# Patient Record
Sex: Male | Born: 1935 | Race: Black or African American | Hispanic: No | Smoking: Former smoker
Health system: Southern US, Community
[De-identification: ages and names within clinical notes are randomized; demographics above are authoritative.]

## PROBLEM LIST (undated history)

## (undated) DIAGNOSIS — E119 Type 2 diabetes mellitus without complications: Secondary | ICD-10-CM

## (undated) DIAGNOSIS — I10 Essential (primary) hypertension: Secondary | ICD-10-CM

## (undated) DIAGNOSIS — N183 Chronic kidney disease, stage 3 unspecified: Secondary | ICD-10-CM

## (undated) DIAGNOSIS — K859 Acute pancreatitis without necrosis or infection, unspecified: Secondary | ICD-10-CM

## (undated) DIAGNOSIS — I872 Venous insufficiency (chronic) (peripheral): Secondary | ICD-10-CM

## (undated) DIAGNOSIS — R569 Unspecified convulsions: Secondary | ICD-10-CM

## (undated) DIAGNOSIS — R269 Unspecified abnormalities of gait and mobility: Secondary | ICD-10-CM

## (undated) DIAGNOSIS — I4891 Unspecified atrial fibrillation: Secondary | ICD-10-CM

## (undated) DIAGNOSIS — I639 Cerebral infarction, unspecified: Secondary | ICD-10-CM

## (undated) DIAGNOSIS — C801 Malignant (primary) neoplasm, unspecified: Secondary | ICD-10-CM

## (undated) DIAGNOSIS — M1990 Unspecified osteoarthritis, unspecified site: Secondary | ICD-10-CM

## (undated) DIAGNOSIS — F0390 Unspecified dementia without behavioral disturbance: Secondary | ICD-10-CM

## (undated) DIAGNOSIS — D649 Anemia, unspecified: Secondary | ICD-10-CM

## (undated) HISTORY — DX: Anemia, unspecified: D64.9

## (undated) HISTORY — DX: Type 2 diabetes mellitus without complications: E11.9

## (undated) HISTORY — DX: Unspecified osteoarthritis, unspecified site: M19.90

## (undated) HISTORY — DX: Unspecified convulsions: R56.9

## (undated) HISTORY — DX: Cerebral infarction, unspecified: I63.9

## (undated) HISTORY — PX: CHOLECYSTECTOMY: SHX55

## (undated) HISTORY — DX: Essential (primary) hypertension: I10

## (undated) HISTORY — DX: Unspecified atrial fibrillation: I48.91

## (undated) HISTORY — DX: Chronic kidney disease, stage 3 unspecified: N18.30

## (undated) HISTORY — DX: Unspecified abnormalities of gait and mobility: R26.9

## (undated) HISTORY — PX: HERNIA REPAIR: SHX51

## (undated) HISTORY — DX: Venous insufficiency (chronic) (peripheral): I87.2

---

## 1997-10-29 ENCOUNTER — Emergency Department (HOSPITAL_COMMUNITY): Admission: EM | Admit: 1997-10-29 | Discharge: 1997-10-29 | Payer: Self-pay | Admitting: Emergency Medicine

## 1997-12-11 ENCOUNTER — Inpatient Hospital Stay (HOSPITAL_COMMUNITY): Admission: EM | Admit: 1997-12-11 | Discharge: 1997-12-18 | Payer: Self-pay | Admitting: Emergency Medicine

## 1997-12-18 ENCOUNTER — Inpatient Hospital Stay (HOSPITAL_COMMUNITY)
Admission: RE | Admit: 1997-12-18 | Discharge: 1998-01-10 | Payer: Self-pay | Admitting: Physical Medicine & Rehabilitation

## 1998-02-17 ENCOUNTER — Inpatient Hospital Stay (HOSPITAL_COMMUNITY): Admission: EM | Admit: 1998-02-17 | Discharge: 1998-02-19 | Payer: Self-pay | Admitting: Emergency Medicine

## 1999-01-19 ENCOUNTER — Inpatient Hospital Stay (HOSPITAL_COMMUNITY): Admission: EM | Admit: 1999-01-19 | Discharge: 1999-01-20 | Payer: Self-pay | Admitting: Emergency Medicine

## 1999-04-26 ENCOUNTER — Encounter: Payer: Self-pay | Admitting: Emergency Medicine

## 1999-04-26 ENCOUNTER — Emergency Department (HOSPITAL_COMMUNITY): Admission: EM | Admit: 1999-04-26 | Discharge: 1999-04-26 | Payer: Self-pay | Admitting: Emergency Medicine

## 1999-07-21 ENCOUNTER — Encounter: Payer: Self-pay | Admitting: Emergency Medicine

## 1999-07-21 ENCOUNTER — Emergency Department (HOSPITAL_COMMUNITY): Admission: EM | Admit: 1999-07-21 | Discharge: 1999-07-21 | Payer: Self-pay | Admitting: Emergency Medicine

## 1999-07-29 ENCOUNTER — Encounter: Payer: Self-pay | Admitting: *Deleted

## 1999-07-29 ENCOUNTER — Inpatient Hospital Stay (HOSPITAL_COMMUNITY): Admission: EM | Admit: 1999-07-29 | Discharge: 1999-08-05 | Payer: Self-pay | Admitting: Emergency Medicine

## 1999-07-30 ENCOUNTER — Encounter: Payer: Self-pay | Admitting: Pulmonary Disease

## 1999-11-02 ENCOUNTER — Encounter: Payer: Self-pay | Admitting: Emergency Medicine

## 1999-11-02 ENCOUNTER — Emergency Department (HOSPITAL_COMMUNITY): Admission: EM | Admit: 1999-11-02 | Discharge: 1999-11-02 | Payer: Self-pay | Admitting: Emergency Medicine

## 2000-09-29 ENCOUNTER — Inpatient Hospital Stay (HOSPITAL_COMMUNITY): Admission: EM | Admit: 2000-09-29 | Discharge: 2000-10-05 | Payer: Self-pay | Admitting: Emergency Medicine

## 2000-09-29 ENCOUNTER — Encounter: Payer: Self-pay | Admitting: Emergency Medicine

## 2000-09-29 ENCOUNTER — Encounter: Payer: Self-pay | Admitting: Pulmonary Disease

## 2000-10-02 ENCOUNTER — Encounter: Payer: Self-pay | Admitting: Pulmonary Disease

## 2000-10-06 ENCOUNTER — Inpatient Hospital Stay (HOSPITAL_COMMUNITY): Admission: EM | Admit: 2000-10-06 | Discharge: 2000-10-10 | Payer: Self-pay | Admitting: Emergency Medicine

## 2000-10-06 ENCOUNTER — Encounter: Payer: Self-pay | Admitting: Emergency Medicine

## 2000-10-07 ENCOUNTER — Encounter: Payer: Self-pay | Admitting: Internal Medicine

## 2000-10-08 ENCOUNTER — Encounter: Payer: Self-pay | Admitting: Internal Medicine

## 2000-10-21 ENCOUNTER — Ambulatory Visit (HOSPITAL_COMMUNITY): Admission: RE | Admit: 2000-10-21 | Discharge: 2000-10-21 | Payer: Self-pay | Admitting: Pulmonary Disease

## 2000-10-21 ENCOUNTER — Encounter: Payer: Self-pay | Admitting: Pulmonary Disease

## 2001-04-20 ENCOUNTER — Encounter: Payer: Self-pay | Admitting: Emergency Medicine

## 2001-04-20 ENCOUNTER — Emergency Department (HOSPITAL_COMMUNITY): Admission: EM | Admit: 2001-04-20 | Discharge: 2001-04-20 | Payer: Self-pay | Admitting: Emergency Medicine

## 2002-03-05 ENCOUNTER — Inpatient Hospital Stay (HOSPITAL_COMMUNITY): Admission: EM | Admit: 2002-03-05 | Discharge: 2002-03-08 | Payer: Self-pay | Admitting: Emergency Medicine

## 2002-03-06 ENCOUNTER — Encounter: Payer: Self-pay | Admitting: Pulmonary Disease

## 2002-04-02 ENCOUNTER — Emergency Department (HOSPITAL_COMMUNITY): Admission: EM | Admit: 2002-04-02 | Discharge: 2002-04-02 | Payer: Self-pay

## 2002-08-25 ENCOUNTER — Inpatient Hospital Stay (HOSPITAL_COMMUNITY): Admission: EM | Admit: 2002-08-25 | Discharge: 2002-08-27 | Payer: Self-pay | Admitting: Emergency Medicine

## 2003-07-16 ENCOUNTER — Encounter: Admission: RE | Admit: 2003-07-16 | Discharge: 2003-07-16 | Payer: Self-pay | Admitting: Otolaryngology

## 2003-07-17 ENCOUNTER — Ambulatory Visit (HOSPITAL_BASED_OUTPATIENT_CLINIC_OR_DEPARTMENT_OTHER): Admission: RE | Admit: 2003-07-17 | Discharge: 2003-07-17 | Payer: Self-pay | Admitting: Otolaryngology

## 2003-07-17 ENCOUNTER — Encounter (INDEPENDENT_AMBULATORY_CARE_PROVIDER_SITE_OTHER): Payer: Self-pay | Admitting: *Deleted

## 2003-07-17 ENCOUNTER — Ambulatory Visit (HOSPITAL_COMMUNITY): Admission: RE | Admit: 2003-07-17 | Discharge: 2003-07-17 | Payer: Self-pay | Admitting: Otolaryngology

## 2003-08-06 ENCOUNTER — Ambulatory Visit: Admission: RE | Admit: 2003-08-06 | Discharge: 2003-11-01 | Payer: Self-pay | Admitting: *Deleted

## 2003-08-09 ENCOUNTER — Ambulatory Visit (HOSPITAL_COMMUNITY): Admission: RE | Admit: 2003-08-09 | Discharge: 2003-08-09 | Payer: Self-pay | Admitting: *Deleted

## 2003-08-12 ENCOUNTER — Encounter: Admission: RE | Admit: 2003-08-12 | Discharge: 2003-08-12 | Payer: Self-pay | Admitting: Dentistry

## 2003-09-06 ENCOUNTER — Ambulatory Visit (HOSPITAL_COMMUNITY): Admission: RE | Admit: 2003-09-06 | Discharge: 2003-09-06 | Payer: Self-pay | Admitting: Oncology

## 2003-09-18 ENCOUNTER — Emergency Department (HOSPITAL_COMMUNITY): Admission: EM | Admit: 2003-09-18 | Discharge: 2003-09-18 | Payer: Self-pay | Admitting: Emergency Medicine

## 2003-09-19 ENCOUNTER — Ambulatory Visit (HOSPITAL_COMMUNITY): Admission: RE | Admit: 2003-09-19 | Discharge: 2003-09-19 | Payer: Self-pay | Admitting: *Deleted

## 2003-11-25 ENCOUNTER — Ambulatory Visit (HOSPITAL_COMMUNITY): Admission: RE | Admit: 2003-11-25 | Discharge: 2003-11-25 | Payer: Self-pay | Admitting: *Deleted

## 2003-11-27 ENCOUNTER — Ambulatory Visit: Admission: RE | Admit: 2003-11-27 | Discharge: 2003-11-27 | Payer: Self-pay | Admitting: *Deleted

## 2004-01-22 ENCOUNTER — Inpatient Hospital Stay (HOSPITAL_COMMUNITY): Admission: RE | Admit: 2004-01-22 | Discharge: 2004-01-25 | Payer: Self-pay | Admitting: Otolaryngology

## 2004-01-22 ENCOUNTER — Encounter (INDEPENDENT_AMBULATORY_CARE_PROVIDER_SITE_OTHER): Payer: Self-pay | Admitting: *Deleted

## 2004-02-14 ENCOUNTER — Ambulatory Visit: Admission: RE | Admit: 2004-02-14 | Discharge: 2004-02-14 | Payer: Self-pay | Admitting: *Deleted

## 2004-02-18 ENCOUNTER — Ambulatory Visit (HOSPITAL_COMMUNITY): Admission: RE | Admit: 2004-02-18 | Discharge: 2004-02-18 | Payer: Self-pay | Admitting: *Deleted

## 2004-02-21 ENCOUNTER — Ambulatory Visit (HOSPITAL_COMMUNITY): Admission: RE | Admit: 2004-02-21 | Discharge: 2004-02-21 | Payer: Self-pay | Admitting: *Deleted

## 2004-03-05 ENCOUNTER — Emergency Department (HOSPITAL_COMMUNITY): Admission: EM | Admit: 2004-03-05 | Discharge: 2004-03-05 | Payer: Self-pay | Admitting: Emergency Medicine

## 2004-03-12 ENCOUNTER — Ambulatory Visit: Payer: Self-pay | Admitting: Dentistry

## 2004-03-23 ENCOUNTER — Ambulatory Visit: Payer: Self-pay | Admitting: Dentistry

## 2004-03-25 ENCOUNTER — Ambulatory Visit (HOSPITAL_COMMUNITY): Admission: RE | Admit: 2004-03-25 | Discharge: 2004-03-25 | Payer: Self-pay | Admitting: Family Medicine

## 2004-03-31 ENCOUNTER — Ambulatory Visit: Payer: Self-pay | Admitting: Dentistry

## 2004-04-03 ENCOUNTER — Ambulatory Visit: Payer: Self-pay | Admitting: Dentistry

## 2004-04-14 ENCOUNTER — Ambulatory Visit: Payer: Self-pay | Admitting: Dentistry

## 2004-06-09 ENCOUNTER — Ambulatory Visit: Payer: Self-pay | Admitting: Dentistry

## 2004-07-22 ENCOUNTER — Ambulatory Visit: Payer: Self-pay | Admitting: Dentistry

## 2004-07-27 ENCOUNTER — Ambulatory Visit (HOSPITAL_COMMUNITY): Admission: RE | Admit: 2004-07-27 | Discharge: 2004-07-27 | Payer: Self-pay | Admitting: Oncology

## 2004-07-31 ENCOUNTER — Emergency Department (HOSPITAL_COMMUNITY): Admission: EM | Admit: 2004-07-31 | Discharge: 2004-07-31 | Payer: Self-pay | Admitting: Emergency Medicine

## 2004-08-06 ENCOUNTER — Ambulatory Visit: Payer: Self-pay | Admitting: Oncology

## 2004-08-12 ENCOUNTER — Ambulatory Visit: Admission: RE | Admit: 2004-08-12 | Discharge: 2004-11-10 | Payer: Self-pay | Admitting: Radiation Oncology

## 2004-08-23 ENCOUNTER — Emergency Department (HOSPITAL_COMMUNITY): Admission: EM | Admit: 2004-08-23 | Discharge: 2004-08-23 | Payer: Self-pay | Admitting: Emergency Medicine

## 2004-10-13 ENCOUNTER — Ambulatory Visit: Payer: Self-pay | Admitting: Dentistry

## 2004-11-24 ENCOUNTER — Ambulatory Visit: Payer: Self-pay | Admitting: Dentistry

## 2005-01-17 ENCOUNTER — Emergency Department (HOSPITAL_COMMUNITY): Admission: EM | Admit: 2005-01-17 | Discharge: 2005-01-17 | Payer: Self-pay | Admitting: Emergency Medicine

## 2005-01-28 ENCOUNTER — Ambulatory Visit: Payer: Self-pay | Admitting: Hematology and Oncology

## 2005-01-29 ENCOUNTER — Ambulatory Visit (HOSPITAL_COMMUNITY): Admission: RE | Admit: 2005-01-29 | Discharge: 2005-01-29 | Payer: Self-pay | Admitting: Oncology

## 2005-02-17 ENCOUNTER — Ambulatory Visit: Admission: RE | Admit: 2005-02-17 | Discharge: 2005-02-19 | Payer: Self-pay | Admitting: *Deleted

## 2005-02-17 ENCOUNTER — Ambulatory Visit (HOSPITAL_COMMUNITY): Admission: RE | Admit: 2005-02-17 | Discharge: 2005-02-17 | Payer: Self-pay | Admitting: Hematology and Oncology

## 2005-04-25 ENCOUNTER — Emergency Department (HOSPITAL_COMMUNITY): Admission: EM | Admit: 2005-04-25 | Discharge: 2005-04-25 | Payer: Self-pay | Admitting: Emergency Medicine

## 2005-04-28 ENCOUNTER — Ambulatory Visit: Payer: Self-pay | Admitting: Dentistry

## 2005-05-05 ENCOUNTER — Ambulatory Visit: Payer: Self-pay | Admitting: Dentistry

## 2005-05-12 ENCOUNTER — Ambulatory Visit: Payer: Self-pay | Admitting: Dentistry

## 2005-07-26 ENCOUNTER — Ambulatory Visit: Payer: Self-pay | Admitting: Hematology and Oncology

## 2005-07-28 ENCOUNTER — Ambulatory Visit (HOSPITAL_COMMUNITY): Admission: RE | Admit: 2005-07-28 | Discharge: 2005-07-28 | Payer: Self-pay | Admitting: Hematology and Oncology

## 2005-07-28 ENCOUNTER — Ambulatory Visit: Payer: Self-pay | Admitting: Dentistry

## 2006-02-02 ENCOUNTER — Ambulatory Visit (HOSPITAL_COMMUNITY): Admission: RE | Admit: 2006-02-02 | Discharge: 2006-02-02 | Payer: Self-pay | Admitting: Hematology and Oncology

## 2006-02-07 ENCOUNTER — Ambulatory Visit: Payer: Self-pay | Admitting: Hematology and Oncology

## 2006-02-16 ENCOUNTER — Ambulatory Visit: Payer: Self-pay | Admitting: Dentistry

## 2006-02-22 LAB — CBC WITH DIFFERENTIAL/PLATELET
Basophils Absolute: 0 10*3/uL (ref 0.0–0.1)
EOS%: 5.8 % (ref 0.0–7.0)
HGB: 11.4 g/dL — ABNORMAL LOW (ref 13.0–17.1)
LYMPH%: 13.9 % — ABNORMAL LOW (ref 14.0–48.0)
MCH: 30.3 pg (ref 28.0–33.4)
MCV: 91 fL (ref 81.6–98.0)
MONO%: 9.2 % (ref 0.0–13.0)
Platelets: 286 10*3/uL (ref 145–400)
RBC: 3.78 10*6/uL — ABNORMAL LOW (ref 4.20–5.71)
RDW: 12.3 % (ref 11.2–14.6)

## 2006-02-22 LAB — COMPREHENSIVE METABOLIC PANEL
AST: 20 U/L (ref 0–37)
Albumin: 4.2 g/dL (ref 3.5–5.2)
Alkaline Phosphatase: 97 U/L (ref 39–117)
BUN: 13 mg/dL (ref 6–23)
Potassium: 5.1 mEq/L (ref 3.5–5.3)
Total Bilirubin: 0.3 mg/dL (ref 0.3–1.2)

## 2006-05-17 ENCOUNTER — Ambulatory Visit: Payer: Self-pay | Admitting: Dentistry

## 2006-07-10 ENCOUNTER — Emergency Department (HOSPITAL_COMMUNITY): Admission: EM | Admit: 2006-07-10 | Discharge: 2006-07-10 | Payer: Self-pay | Admitting: Emergency Medicine

## 2006-10-24 ENCOUNTER — Ambulatory Visit: Payer: Self-pay | Admitting: Hematology and Oncology

## 2006-10-24 ENCOUNTER — Ambulatory Visit (HOSPITAL_COMMUNITY): Admission: RE | Admit: 2006-10-24 | Discharge: 2006-10-24 | Payer: Self-pay | Admitting: Hematology and Oncology

## 2006-10-27 LAB — COMPREHENSIVE METABOLIC PANEL
AST: 20 U/L (ref 0–37)
Albumin: 3.9 g/dL (ref 3.5–5.2)
Alkaline Phosphatase: 89 U/L (ref 39–117)
BUN: 17 mg/dL (ref 6–23)
Creatinine, Ser: 0.86 mg/dL (ref 0.40–1.50)
Glucose, Bld: 58 mg/dL — ABNORMAL LOW (ref 70–99)
Total Bilirubin: 0.3 mg/dL (ref 0.3–1.2)

## 2006-10-27 LAB — CBC WITH DIFFERENTIAL/PLATELET
Basophils Absolute: 0 10*3/uL (ref 0.0–0.1)
EOS%: 6.3 % (ref 0.0–7.0)
Eosinophils Absolute: 0.3 10*3/uL (ref 0.0–0.5)
HCT: 36.2 % — ABNORMAL LOW (ref 38.7–49.9)
HGB: 12.3 g/dL — ABNORMAL LOW (ref 13.0–17.1)
LYMPH%: 17.7 % (ref 14.0–48.0)
MCH: 30.3 pg (ref 28.0–33.4)
MCV: 89.4 fL (ref 81.6–98.0)
MONO%: 9.4 % (ref 0.0–13.0)
NEUT#: 3.6 10*3/uL (ref 1.5–6.5)
NEUT%: 66.2 % (ref 40.0–75.0)
Platelets: 239 10*3/uL (ref 145–400)
RDW: 12.2 % (ref 11.2–14.6)

## 2006-11-28 ENCOUNTER — Ambulatory Visit: Payer: Self-pay | Admitting: Dentistry

## 2006-11-29 ENCOUNTER — Emergency Department (HOSPITAL_COMMUNITY): Admission: EM | Admit: 2006-11-29 | Discharge: 2006-11-29 | Payer: Self-pay | Admitting: Emergency Medicine

## 2006-12-23 ENCOUNTER — Emergency Department (HOSPITAL_COMMUNITY): Admission: EM | Admit: 2006-12-23 | Discharge: 2006-12-23 | Payer: Self-pay | Admitting: Emergency Medicine

## 2007-01-05 ENCOUNTER — Ambulatory Visit: Payer: Self-pay | Admitting: Dentistry

## 2007-02-28 ENCOUNTER — Ambulatory Visit: Payer: Self-pay | Admitting: Dentistry

## 2007-05-16 ENCOUNTER — Ambulatory Visit: Payer: Self-pay | Admitting: Dentistry

## 2007-07-25 ENCOUNTER — Ambulatory Visit: Payer: Self-pay | Admitting: Dentistry

## 2007-10-05 ENCOUNTER — Emergency Department (HOSPITAL_COMMUNITY): Admission: EM | Admit: 2007-10-05 | Discharge: 2007-10-05 | Payer: Self-pay | Admitting: Emergency Medicine

## 2007-10-18 ENCOUNTER — Ambulatory Visit: Payer: Self-pay | Admitting: Hematology and Oncology

## 2007-10-20 LAB — COMPREHENSIVE METABOLIC PANEL
AST: 16 U/L (ref 0–37)
Albumin: 4.1 g/dL (ref 3.5–5.2)
Alkaline Phosphatase: 97 U/L (ref 39–117)
Potassium: 4.5 mEq/L (ref 3.5–5.3)
Sodium: 137 mEq/L (ref 135–145)
Total Protein: 6.8 g/dL (ref 6.0–8.3)

## 2007-10-20 LAB — CBC WITH DIFFERENTIAL/PLATELET
BASO%: 0.1 % (ref 0.0–2.0)
EOS%: 7.8 % — ABNORMAL HIGH (ref 0.0–7.0)
MCH: 28.6 pg (ref 28.0–33.4)
MCHC: 33.8 g/dL (ref 32.0–35.9)
MCV: 84.5 fL (ref 81.6–98.0)
MONO%: 6 % (ref 0.0–13.0)
NEUT#: 4 10*3/uL (ref 1.5–6.5)
RBC: 4.21 10*6/uL (ref 4.20–5.71)
RDW: 11.7 % (ref 11.2–14.6)

## 2007-11-10 ENCOUNTER — Ambulatory Visit (HOSPITAL_COMMUNITY): Admission: RE | Admit: 2007-11-10 | Discharge: 2007-11-10 | Payer: Self-pay | Admitting: Hematology and Oncology

## 2007-11-29 ENCOUNTER — Ambulatory Visit: Payer: Self-pay | Admitting: Dentistry

## 2008-03-27 ENCOUNTER — Emergency Department (HOSPITAL_COMMUNITY): Admission: EM | Admit: 2008-03-27 | Discharge: 2008-03-27 | Payer: Self-pay | Admitting: Emergency Medicine

## 2008-07-23 ENCOUNTER — Ambulatory Visit: Payer: Self-pay | Admitting: Dentistry

## 2008-09-30 ENCOUNTER — Ambulatory Visit (HOSPITAL_COMMUNITY): Admission: RE | Admit: 2008-09-30 | Discharge: 2008-09-30 | Payer: Self-pay | Admitting: Unknown Physician Specialty

## 2008-10-22 ENCOUNTER — Ambulatory Visit (HOSPITAL_COMMUNITY): Admission: RE | Admit: 2008-10-22 | Discharge: 2008-10-22 | Payer: Self-pay | Admitting: Unknown Physician Specialty

## 2008-11-06 ENCOUNTER — Ambulatory Visit: Payer: Self-pay | Admitting: Hematology and Oncology

## 2008-11-08 ENCOUNTER — Ambulatory Visit (HOSPITAL_COMMUNITY): Admission: RE | Admit: 2008-11-08 | Discharge: 2008-11-08 | Payer: Self-pay | Admitting: Hematology and Oncology

## 2008-11-22 LAB — CBC WITH DIFFERENTIAL/PLATELET
Basophils Absolute: 0 10*3/uL (ref 0.0–0.1)
EOS%: 7.5 % — ABNORMAL HIGH (ref 0.0–7.0)
HCT: 34.8 % — ABNORMAL LOW (ref 38.4–49.9)
HGB: 11.5 g/dL — ABNORMAL LOW (ref 13.0–17.1)
MCH: 29 pg (ref 27.2–33.4)
NEUT%: 68.8 % (ref 39.0–75.0)
lymph#: 0.9 10*3/uL (ref 0.9–3.3)

## 2008-11-22 LAB — BASIC METABOLIC PANEL WITH GFR
BUN: 18 mg/dL (ref 6–23)
CO2: 24 meq/L (ref 19–32)
Calcium: 9.1 mg/dL (ref 8.4–10.5)
Chloride: 99 meq/L (ref 96–112)
Creatinine, Ser: 0.92 mg/dL (ref 0.40–1.50)
Glucose, Bld: 83 mg/dL (ref 70–99)
Potassium: 4.5 meq/L (ref 3.5–5.3)
Sodium: 134 meq/L — ABNORMAL LOW (ref 135–145)

## 2008-12-17 ENCOUNTER — Emergency Department (HOSPITAL_COMMUNITY): Admission: EM | Admit: 2008-12-17 | Discharge: 2008-12-17 | Payer: Self-pay | Admitting: Emergency Medicine

## 2009-02-15 ENCOUNTER — Emergency Department (HOSPITAL_COMMUNITY): Admission: EM | Admit: 2009-02-15 | Discharge: 2009-02-15 | Payer: Self-pay | Admitting: Emergency Medicine

## 2009-02-19 ENCOUNTER — Ambulatory Visit (HOSPITAL_COMMUNITY): Admission: RE | Admit: 2009-02-19 | Discharge: 2009-02-19 | Payer: Self-pay | Admitting: Internal Medicine

## 2009-08-06 ENCOUNTER — Emergency Department (HOSPITAL_COMMUNITY): Admission: EM | Admit: 2009-08-06 | Discharge: 2009-08-06 | Payer: Self-pay | Admitting: Emergency Medicine

## 2009-11-14 ENCOUNTER — Ambulatory Visit: Payer: Self-pay | Admitting: Hematology and Oncology

## 2009-11-26 ENCOUNTER — Ambulatory Visit (HOSPITAL_COMMUNITY): Admission: RE | Admit: 2009-11-26 | Discharge: 2009-11-26 | Payer: Self-pay | Admitting: Hematology and Oncology

## 2009-11-28 LAB — CBC WITH DIFFERENTIAL/PLATELET
BASO%: 0.8 % (ref 0.0–2.0)
Basophils Absolute: 0.1 10*3/uL (ref 0.0–0.1)
EOS%: 8.3 % — ABNORMAL HIGH (ref 0.0–7.0)
HGB: 12.6 g/dL — ABNORMAL LOW (ref 13.0–17.1)
MCH: 28.9 pg (ref 27.2–33.4)
MCHC: 33.5 g/dL (ref 32.0–36.0)
MCV: 86.2 fL (ref 79.3–98.0)
MONO%: 9.3 % (ref 0.0–14.0)
RBC: 4.36 10*6/uL (ref 4.20–5.82)
RDW: 11.6 % (ref 11.0–14.6)
lymph#: 1.5 10*3/uL (ref 0.9–3.3)

## 2009-11-28 LAB — COMPREHENSIVE METABOLIC PANEL
ALT: 8 U/L (ref 0–53)
AST: 17 U/L (ref 0–37)
Albumin: 4.2 g/dL (ref 3.5–5.2)
Alkaline Phosphatase: 73 U/L (ref 39–117)
BUN: 15 mg/dL (ref 6–23)
Potassium: 4.1 mEq/L (ref 3.5–5.3)

## 2010-08-01 ENCOUNTER — Encounter: Payer: Self-pay | Admitting: Oncology

## 2010-08-02 ENCOUNTER — Encounter: Payer: Self-pay | Admitting: Hematology and Oncology

## 2010-08-02 ENCOUNTER — Encounter: Payer: Self-pay | Admitting: Family Medicine

## 2010-10-17 LAB — DIFFERENTIAL
Basophils Absolute: 0 10*3/uL (ref 0.0–0.1)
Basophils Relative: 0 % (ref 0–1)
Neutro Abs: 2.6 10*3/uL (ref 1.7–7.7)
Neutrophils Relative %: 66 % (ref 43–77)

## 2010-10-17 LAB — CBC
HCT: 36.1 % — ABNORMAL LOW (ref 39.0–52.0)
Hemoglobin: 12.4 g/dL — ABNORMAL LOW (ref 13.0–17.0)
MCV: 88.1 fL (ref 78.0–100.0)
RDW: 12.3 % (ref 11.5–15.5)
WBC: 3.9 10*3/uL — ABNORMAL LOW (ref 4.0–10.5)

## 2010-10-17 LAB — URINALYSIS, ROUTINE W REFLEX MICROSCOPIC
Bilirubin Urine: NEGATIVE
Nitrite: NEGATIVE
Specific Gravity, Urine: 1.012 (ref 1.005–1.030)
Urobilinogen, UA: 0.2 mg/dL (ref 0.0–1.0)

## 2010-10-17 LAB — COMPREHENSIVE METABOLIC PANEL
Alkaline Phosphatase: 127 U/L — ABNORMAL HIGH (ref 39–117)
BUN: 9 mg/dL (ref 6–23)
Chloride: 95 mEq/L — ABNORMAL LOW (ref 96–112)
Glucose, Bld: 98 mg/dL (ref 70–99)
Potassium: 4.3 mEq/L (ref 3.5–5.1)
Total Bilirubin: 0.7 mg/dL (ref 0.3–1.2)

## 2010-10-17 LAB — POCT CARDIAC MARKERS: Myoglobin, poc: 134 ng/mL (ref 12–200)

## 2010-10-17 LAB — GLUCOSE, CAPILLARY: Glucose-Capillary: 90 mg/dL (ref 70–99)

## 2010-10-21 LAB — GLUCOSE, CAPILLARY: Glucose-Capillary: 109 mg/dL — ABNORMAL HIGH (ref 70–99)

## 2010-11-27 NOTE — Op Note (Signed)
NAMEANTHONIO, Ronald Sutton                               ACCOUNT NO.:  [id number]   MEDICAL RECORD NO.:  [id number]                   PATIENT TYPE:  AMB   LOCATION:  DSC                                  FACILITY:  [facility]   PHYSICIAN:  Suzanna Obey, M.D.                    DATE OF BIRTH:  01/22/1931   DATE OF PROCEDURE:  07/17/2003  DATE OF DISCHARGE:                                 OPERATIVE REPORT   PREOPERATIVE DIAGNOSIS:  Right base of tongue tumor.   POSTOPERATIVE DIAGNOSIS:  Right base of tongue tumor.   OPERATION PERFORMED:  Direct laryngoscopy and esophagoscopy.   SURGEON:  Suzanna Obey, M.D.   ANESTHESIA:  General endotracheal tube.   ESTIMATED BLOOD LOSS:  Approximately 20 mL.   INDICATIONS FOR PROCEDURE:  This is a 77 year old who has complained about  right ear pain for many months.  He has a right neck mass.  On fiberoptic  exam in the office there is a mass that is submucosal on the right base of  tongue.  He now is for biopsy and then subsequent treatment after  identification of any potential tumor.  He was informed of the risks and  benefits of the procedure including bleeding, infection, scarring,  perforation of the esophagus and risks of the anesthetic.  All questions  were answered and consent was obtained.   DESCRIPTION OF PROCEDURE:  The patient was taken to the operating room and  placed in supine position.  After adequate general endotracheal tube  anesthesia, he was placed in the [facility] position.  The gums were protected  with a wet 4 x 4 and then the direct laryngoscopy was performed.  Immediately, a very large firm base tongue tumor was identified which was  oozing from a generalized place secondary to the intubation. The tumor  extended by finger palpation from midline over completely to the lateral  aspect of the tongue.  It extended down to the vallecula.  It was  approximately 3 to 4 cm in size.  The esophagoscopy was then performed prior  to taking a biopsy  from the mass and there was no evidence of any esophageal  or cervical esophageal lesions.  The piriforms and larynx looked good.  Epiglottis looked clear.  The biopsies were then taken from this mass which  was not exophytic but rather submucosal.  Deep biopsies were taken with a  straight and upbiting forceps which good specimen was obtained.  A pledget  with Adrenalin was placed into the biopsy site and over the tumor site to  gain hemostasis.  It looked good  with just a very mild amount of oozing at the completion. It still could not  be determined exactly where the oozing which was there at the beginning of  the case, was coming from.  He was then awakened and brought to recovery  room  in stable condition.  Counts correct.                                               Suzanna Obey, M.D.    [name]  D:  07/17/2003  T:  07/17/2003  Job:  [id number]   cc:   Zacarias Pontes  [address]  [place]  [place] [address]  Fax: [phone]

## 2010-11-27 NOTE — Op Note (Signed)
[name], Ronald Sutton                               ACCOUNT NO.:  [id number]   MEDICAL RECORD NO.:  [id number]                   PATIENT TYPE:  INP   LOCATION:  2550                                 FACILITY:  [facility]   PHYSICIAN:  Suzanna Obey, M.D.                    DATE OF BIRTH:  1930-07-15   DATE OF PROCEDURE:  01/22/2004  DATE OF DISCHARGE:                                 OPERATIVE REPORT   PREOPERATIVE DIAGNOSIS:  Right neck mass.   POSTOPERATIVE DIAGNOSIS:  Right neck mass.   OPERATION PERFORMED:  Right neck dissection.   SURGEON:  Suzanna Obey, M.D.   ANESTHESIA:  General endotracheal tube.   ESTIMATED BLOOD LOSS:  Less than 10 mL.   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  He has now persisted with a right  neck mass that was present prior to the treatment.  He is now for completion  treatment with neck dissection.  He was informed of the risks and benefits  of the procedure including bleeding, infection, scar, numbness of the neck  and ear, difficulty swallowing, hoarseness, weakness of the shoulder, and  risks of the anesthetic.  All questions were answered and consent was  obtained.   DESCRIPTION OF PROCEDURE:  The patient was taken to the operating room and  placed in supine position.  After adequate general endotracheal tube  anesthesia, he as well as placed in the left gaze position, prepped and  draped in the usual sterile manner.  A hockey stick incision was directed  from the lobe of the ear down toward the thyroid cartilage.  This was opened  with electrocautery.  Dissection was continued raising a flap of  subplatysmal.  The fascia was then taken off the sternocleidomastoid muscle.  The external jugular vein was preserved.  The dissection was carried down to  the spinal accessory nerve where it was dissected free up to the  digastric.  The fascia was taken off inferior to the spinal accessory nerve as the mass  was down in about the level 3 zone.  Dissection was carried along the  cervical plexus region and this mass was brought up taking the dissection  down below the omohyoid muscle.  This was then brought up and dissected off  the carotid and the jugular vein.  Once the tumor was freed up at the  jugular vein, it was palpated and it did feel like it might be still  somewhat adherent to the jugular vein and rather than dissect this, the  jugular vein was just taken inferiorly, clamped with stick tie of 3-0 silk  and a 2-0 silk tie and it was divided.  The dissection was then continued  removing the specimen and jugular vein  off the carotid artery.  The vagus  nerve was preserved.  Dissection was carried up to the hypoglossal nerve  where it was identified and carefully preserved.  The facial vein was  clamped and the superior aspect of the jugular vein was clamped and divided.  The spinal accessory nerve was intact.  The specimen was removed and sent  for pathology.  The wound  was copiously irrigated and there was good hemostasis.  A Valsalva maneuver  was performed.  The #7 flap Jackson-Pratt drain was placed and secured with  a 3-0 nylon.  The subcutaneous was closed with interrupted 4-0 chromic and  skin staples to close the skin.  The patient was awakened and brought to  recovery in stable condition, counts correct.                                               Suzanna Obey, M.D.    Ronald Sutton  D:  01/22/2004  T:  01/22/2004  Job:  [id number]   cc:   Ronald Sutton, M.D.  Fax: [phone]

## 2010-11-27 NOTE — Discharge Summary (Signed)
[place]  Patient:    Ronald Sutton, Ronald Sutton                            MRN: [id number] Adm. Date:  [id number] Disc. Date: 10/10/00 Attending:  Leslee Home D                           Discharge Summary  CHIEF COMPLAINT:  The patient fell down and was brought to the emergency room after having grand mal seizures.  He was admitted from [facility]  HISTORY OF PRESENT ILLNESS:  This is a 77 year old man who has a known history of alcoholic cerebellar degeneration, and has had previous left brain stroke and right-sided weakness.  He was stable until the afternoon of admission, when he began to have acute onset of grand mal seizures.  Following the gran mal seizure he was given 2 mg of Ativan, which controlled his seizure to the point where the seizure had stopped, but he remained comatose.  The patient had no previous history of seizures.  He only had a mild temperature elevation of 99.4 at the time of evaluation in emergency room, but had ongoing tachycardia.  The patient has a history of non-insulin-dependent diabetes mellitus and had been having chest pain, for which he was given nitroglycerin for in the recent past.  PAST MEDICAL HISTORY: 1. Known alcoholism with alcoholic degeneration. 2. History of previous left-sided cerebral CVA. 3. Known history of cerebellar degeneration.  ALLERGIES:  No known drug allergies.  PAST SURGICAL HISTORY:  Cholecystectomy.  SOCIAL HISTORY:  The patient abused alcohol and was a previous heavy user of cigarettes.  Disabled in the 1970s secondary to cerebellar degeneration.  He ambulates with a cane.  His sister has power of attorney.  Currently resident of [facility].  FAMILY HISTORY:  The patients father died of a CVA at the age of 47.  His mother died of unknown causes.  The patient has three brothers, alive and well.  PHYSICAL EXAMINATION:  GENERAL:  The patient appears in manner with his  stated age of 49.  VITAL SIGNS:  Blood pressure 174/89, heart rate 115, temperature 99.4.  HEENT:  Pupils small and were minimally reactive; but the sclera was clear. Neck is supple.  Oral cavity unremarkable.  NECK:  Trachea was midline.  No thyromegaly.  LUNGS:  Clear.  CARDIOVASCULAR:  Rapid sinus rhythm.  No murmurs, but significant pathology. Was difficult to exclude in this setting.  ABDOMEN:  Soft.  The patient has right upper quadrant cholecystectomy scar.  GENITALIA:  Normal male.  EXTREMITIES:  No evidence of edema or other abnormalities.  The patient is not moving the left side.  Reflexes are suppressed throughout.  Plantar signs upgoing on the right and not responsive on the left.  LABORATORY AND OTHER INVESTIGATIONS:  Blood cultures were clear.  CSF cultures with no organisms seen.  Urinalysis was clear.  CSF protein 45, CSF glucose 154.  LDH fluid 13.  There was one red cell and two white cells in his CSF. Troponin I 0.06.  CK 514, CK-MB 4.9.  Comprehensive Metabolic Panel -- Sodium 137, potassium 3.5, chloride 103, CO2 13, glucose 296.  Liver Function Tests were normal.  On October 07, 2000 his alkaline phosphatase was 107. On October 06, 2000 CK was repeated at 203, MB 3.9; his Troponin was 0.01. Initial admission pH 7.116,  pCO2 29.9, bicarbonate 10.  On four liters on October 07, 2000 pH 7.39, pCO2 42.8, pO2 85.6, bicarbonate 25.5.  PT/PTT were normal.  Amylase 75.  Toxin screen negative.  Alcohol level less than 10.  RADIOLOGY:  Ultrasound of the abdomen -- Portions of the head and tail of the pancreas were not well seen, due to overriding bowel gas.  There was some prominence of the pancreatic duct.  A CT scan of the abdomen was suggested. He is status post cholecystectomy.  No intrahepatic ductal dilatation was noted.  CHEST X-Emet:  There is some atelectasis at the right base.  The vascularity was normal.  CT SCAN OF HEAD:  Showed atrophy.  No evidence of acute  abnormalities.  CHEST X-Dagen:  (October 06, 2000)  No active process.  HOSPITAL COURSE:  This patient was admitted with new onset of generalized seizures.  A CT scan of the head was not only negative for acute event, there was really no evidence of an old CVA either.  His toxin screen was normal. His alcohol level was undetectable.  I did not specifically exclude an alcohol withdrawal state, although my sense is that this is probably idiopathic epilepsy.  Because of the tachycardia, he was seen in consultation by Dr. Orpah Cobb. He agreed with serial cardiac enzymes.  A 2d echocardiogram was done, is pending at time of dictation.  Because of abdominal distention, it was felt that we needed to rule out ascites.  Ultrasound was done, which was negative for ascites or significant intrahepatic duct dilatation.  There was no focal liver lesions.  There was some prominence of the pancreatic duct, although my feeling was that we did not need to go ahead and CT scan his abdomen at this time.  In terms of his seizures, the patient is stabilized nicely on Dilantin.  He will need to be followed on this.  His discharge Dilantin level was stable on 300 mg once a day.  In terms of his diabetes, his Glyburide was restarted at 2.5 b.i.d., and his CBGs have done nicely on this.  FINAL DIAGNOSES: 1. Idiopathic epilepsy. 2. Tachycardia with EKG, suggesting the possibility of an old inferior    myocardial infarction.  Echocardiogram result is pending at time    of dictation. 3. Type 2 diabetes mellitus.  DISCHARGE MEDICATIONS: 1. Glyburide 2.5 mg p.o. b.i.d. 2. Ecotrin 325 mg p.o. q.d. 3. Dilantin 300 mg q.d. 4. Vicon Forte one q.d. 5. Nitroglycerin 0.4 mg sublingual p.r.n.  CONDITION ON DISCHARGE:  The patient is discharged in stable condition.  It is recommended that he have a Dilantin level in two weeks time, and if that is stable monthly thereafter.  His cardiac status seemed stable at  present. DD:  10/10/00 TD:  10/10/00 Job: [id number]

## 2010-11-27 NOTE — H&P (Signed)
[place]  Patient:    Ronald Sutton, Ronald Sutton                            MRN: [id number] Adm. Date:  [id number] Attending:  Leslee Home D                         History and Physical  CHIEF COMPLAINT:  The patient "fell down" and was brought in to the emergency room after having grand mal seizures.  The patient was admitted from [facility] rest home.  HISTORY OF PRESENT ILLNESS:  The patient is a 77 year old black male who has a known history of alcoholic cerebellar degeneration and has had a previous left-sided brain CVA and right-sided weakness.  He was stable, until the p.m. of admission, when he began to have acute onset grand mal seizures.  Following the grand mal seizure, he was given 2 mg of Ativan, which controlled his seizure, to the point where the seizures had stopped, but he remained comatose.  The patient had no previous seizures.  He had only mild temperature elevation at 99.4 at time of evaluation at the emergency room and ongoing tachycardia. The patient has a history non-insulin-dependent diabetes mellitus and had been having chest pain, which he was given nitroglycerin for in the recent past.  PAST MEDICAL HISTORY:  Known alcoholism with alcoholic cerebellar degeneration.  He has history of previous left-sided cerebral CVA and known history of cerebellar degeneration.  ALLERGIES:  No known drug allergies.  PAST SURGICAL HISTORY:  Cholecystectomy.  SOCIAL HISTORY:  The patient abused alcohol and previous heavy user of cigarettes.  Disabled since the 1970s secondary to cerebellar degeneration. Ambulates with a cane.  Sister has power-of-attorney.  Her phone number is [phone].  He is currently a resident of [facility].  FAMILY HISTORY:  The patients father died of a CVA at age 37.  Mother died of unknown causes.  The patient has three brothers, alive and well.  He has two children who are also alive and well.  REVIEW OF SYSTEMS:   General:  He has an elevated temperature ______ today. Some recent chest pain but otherwise no complaints.  ______ He has known history of dysphagia in the past, which has resolved.  Dysphagia was following a CVA.  ______:  No complaints.  Cardiovascular:  No tachycardia at time of evaluation.  GI:  History of previous constipation in the past.  GU:  Foley catheter in place.  Otherwise, no complaints.  Musculoskeletal:  Generalized weakness on this occasion.  Prior to this, no onset of any other symptoms.  He has right-sided weakness.  He requires a cane to walk.  Neurologic:  As above. No other neurological symptoms.  PHYSICAL EXAMINATION:  GENERAL:  The patient appears as a man who approximates his age of 71, who is comatose at time of the examination.  VITAL SIGNS:  Blood pressure 174/89, heart rate 115 per minute, temperature 99.4.  HEENT:  Pupils small and minimally reactive but sclera is clear.  Neck is supple.  Oral cavity unremarkable.  NECK:  As mentioned.  Trachea midline.  No thyromegaly.  LUNGS:  Clear to auscultation.  CARDIOVASCULAR:  Rapid sinus rhythm.  Difficult ______ murmurs ______  in this setting.  ABDOMEN:  Soft.  The patient has right upper quadrant cholecystectomy scar.  GENITALIA:  Normal ______ palpation.  Foley catheter in place.  EXTREMITIES:  No evidence of edema or other abnormalities.  The patient is not moving the left side.  Reflexes suppressed throughout.  Plantar signs upgoing on the right and nonresponsive on the left.  The patient is minimally responsive to tactile stimulant.  IMPRESSION: 1. Acute onset of grand mal seizure. 2. Coma secondary to #1 likely. 3. Hyperglycemia secondary to non-insulin-dependent diabetes mellitus. 4. History of previous cerebrovascular accident (left-sided). 5. History of alcohol cerebellar degeneration. 6. Previous chest pains. 7. Rule out myocardial infarction. 8. Sinus tachycardia.  PLAN:  The patient  will be admitted to the neurologic ICU and will undergo evaluation for cause of the patients seizure disorder.  Currently, he is undergoing a spinal tap.  Serial EKGs and enzymes will be evaluated for possible acute myocardial infarction.  A 2-D echocardiogram will be ordered to rule out any thromboembolic phenomenon. DD:  10/06/00 TD:  10/07/00 Job: [id number]

## 2010-11-27 NOTE — Discharge Summary (Signed)
NAMEMY, MADARIAGA                   ACCOUNT NO.:  [id number]   MEDICAL RECORD NO.:  [id number]          PATIENT TYPE:  INP   LOCATION:  5702                         FACILITY:  [facility]   PHYSICIAN:  Suzanna Obey, M.D.       DATE OF BIRTH:  December 27, 1930   DATE OF ADMISSION:  01/22/2004  DATE OF DISCHARGE:  01/25/2004                                 DISCHARGE SUMMARY   ADMISSION DIAGNOSIS:  Right neck mass.   POSTOPERATIVE DIAGNOSIS:  Right neck mass.   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  He  has now had radiation and chemotherapy and persists with a right neck mass.  This now needs to be removed with right neck dissection.  The patient was  admitted for this procedure.   HOSPITAL COURSE:  The patient was admitted after right neck dissection.  He  did very well.  He was able to take fluids almost immediately.  His JP drain  on postoperative day #1 was approximately 30 mL.  The wound looked good with  no evidence of infection.  On postoperative day #2, he was afebrile, 50 mL  from the drain, and wound was healing well.  He was eating and drinking  well.  On hospital day #3, he had about 25 mL of drainage and the JP drain  was removed and the patient was discharged to home.  His wound was healing  great.  He was feeling well and afebrile.  He is to follow up in one week  for suture removal.  He is to follow up sooner if he has swelling, erythema,  increasing pain, or any breathing difficulty.       JB/MEDQ  D:  04/16/2004  T:  04/17/2004  Job:  [id number]

## 2011-04-05 LAB — BASIC METABOLIC PANEL
CO2: 28
Calcium: 8.9
GFR calc Af Amer: >60
Glucose, Bld: 109 — ABNORMAL HIGH
Potassium: 4.4
Sodium: 138

## 2011-04-05 LAB — DIFFERENTIAL
Basophils Relative: 0
Monocytes Relative: 11
Neutro Abs: 3.5
Neutrophils Relative %: 66

## 2011-04-05 LAB — CBC
MCHC: 32.9
Platelets: 210
RBC: 3.92 — ABNORMAL LOW
WBC: 5.3

## 2011-04-12 LAB — POCT I-STAT, CHEM 8
BUN: 16
Chloride: 102
Creatinine, Ser: 1
Potassium: 4
Sodium: 134 — ABNORMAL LOW

## 2011-04-12 LAB — DIFFERENTIAL
Basophils Relative: 0
Lymphocytes Relative: 14
Lymphs Abs: 0.9
Monocytes Absolute: 0.6
Monocytes Relative: 10
Neutro Abs: 4.8

## 2011-04-12 LAB — URINALYSIS, ROUTINE W REFLEX MICROSCOPIC
Glucose, UA: NEGATIVE
Hgb urine dipstick: NEGATIVE
Specific Gravity, Urine: 1.012
pH: 6

## 2011-04-12 LAB — URINE CULTURE

## 2011-04-12 LAB — CBC
Hemoglobin: 12.1 — ABNORMAL LOW
MCHC: 33.3
RBC: 4.02 — ABNORMAL LOW
WBC: 6.6

## 2011-04-12 LAB — SAMPLE TO BLOOD BANK

## 2011-12-07 ENCOUNTER — Inpatient Hospital Stay (HOSPITAL_COMMUNITY)
Admission: EM | Admit: 2011-12-07 | Discharge: 2011-12-09 | DRG: 101 | Disposition: A | Discharge status: Home / Self Care | Payer: Medicare Other | Attending: Internal Medicine | Admitting: Internal Medicine

## 2011-12-07 ENCOUNTER — Encounter (HOSPITAL_COMMUNITY): Payer: Self-pay | Admitting: Emergency Medicine

## 2011-12-07 DIAGNOSIS — D649 Anemia, unspecified: Secondary | ICD-10-CM | POA: Diagnosis present

## 2011-12-07 DIAGNOSIS — G9340 Encephalopathy, unspecified: Secondary | ICD-10-CM | POA: Diagnosis present

## 2011-12-07 DIAGNOSIS — G40909 Epilepsy, unspecified, not intractable, without status epilepticus: Principal | ICD-10-CM | POA: Diagnosis present

## 2011-12-07 DIAGNOSIS — R569 Unspecified convulsions: Secondary | ICD-10-CM | POA: Diagnosis present

## 2011-12-07 DIAGNOSIS — F17200 Nicotine dependence, unspecified, uncomplicated: Secondary | ICD-10-CM | POA: Diagnosis present

## 2011-12-07 DIAGNOSIS — R404 Transient alteration of awareness: Secondary | ICD-10-CM | POA: Diagnosis present

## 2011-12-07 DIAGNOSIS — I10 Essential (primary) hypertension: Secondary | ICD-10-CM | POA: Diagnosis present

## 2011-12-07 HISTORY — DX: Acute pancreatitis without necrosis or infection, unspecified: K85.90

## 2011-12-07 HISTORY — DX: Essential (primary) hypertension: I10

## 2011-12-07 HISTORY — DX: Unspecified convulsions: R56.9

## 2011-12-07 LAB — POCT I-STAT, CHEM 8
Calcium, Ion: 1.12 mmol/L (ref 1.12–1.32)
HCT: 28 % — ABNORMAL LOW (ref 39.0–52.0)
TCO2: 23 mmol/L (ref 0–100)

## 2011-12-07 MED ORDER — LORAZEPAM 2 MG/ML IJ SOLN
2.0000 mg | Freq: Once | INTRAMUSCULAR | Status: AC
  Administered 2011-12-07: 2 mg via INTRAVENOUS

## 2011-12-07 MED ORDER — SODIUM CHLORIDE 0.9 % IV SOLN
1000.0000 mg | Freq: Once | INTRAVENOUS | Status: AC
  Administered 2011-12-07: 1000 mg via INTRAVENOUS
  Filled 2011-12-07: qty 20

## 2011-12-07 MED ORDER — LORAZEPAM 2 MG/ML IJ SOLN
INTRAMUSCULAR | Status: AC
  Filled 2011-12-07: qty 1

## 2011-12-07 NOTE — ED Notes (Signed)
Family out in hall stating pt was having a seizure Went in room to find the pt having a seizure  EDP [employer] received

## 2011-12-07 NOTE — ED Notes (Signed)
Per EMS pt was in the kitchen and family heard him fall and when they went in he was having a seizure  Pt has hx of same and is on medication for which he takes as prescribed  Last seizure was about a month ago  Pt is alert upon arrival but still a little postictal  Saline lock in right arm with #18 gauge  Oxygen on at 2 liters/min via [place]

## 2011-12-07 NOTE — ED Notes (Signed)
MD at bedside. 

## 2011-12-07 NOTE — ED Notes (Signed)
Pt having labs drawn at this time.  

## 2011-12-07 NOTE — ED Notes (Signed)
[id number]<GN> Expected date:12/07/11<BR> Expected time:<BR> Means of arrival:<BR> Comments:<BR> EMS 211 GC - seizure/immobilized

## 2011-12-07 NOTE — ED Provider Notes (Signed)
History     CSN: [id number]  Arrival date & time 12/07/11  2001   First MD Initiated Contact with Patient 12/07/11 2109      Chief Complaint  Patient presents with  . Seizures    (Consider location/radiation/quality/duration/timing/severity/associated sxs/prior treatment) Patient is a 78 y.o. male presenting with seizures. The history is provided by the patient.  Seizures    issue here after having a witnessed seizure this evening at home described as tonic-clonic activity lasting for possibly 2 minutes associated with a brief postictal period. Last seizure was over a year. He takes Dilantin for seizures. Denies any recent illnesses. Denies any fever, vomiting, neck pain, photophobia. Denies any headache or neck pain or back pain. No medications taken prior to arrival. Nothing makes her symptoms better or worse  Past Medical History  Diagnosis Date  . Hypertension   . Seizures   . Pancreatitis     Past Surgical History  Procedure Date  . Hernia repair     Family History  Problem Relation Age of Onset  . Hypertension Other   . Diabetes Other   . Asthma Other     History  Substance Use Topics  . Smoking status: Current Some Day Smoker    Types: Cigars  . Smokeless tobacco: Not on file  . Alcohol Use: No      Review of Systems  Neurological: Positive for seizures.  All other systems reviewed and are negative.    Allergies  Review of patient's allergies indicates no known allergies.  Home Medications   Current Outpatient Rx  Name Route Sig Dispense Refill  . ATENOLOL 25 MG PO TABS Oral Take 25 mg by mouth daily.    Marland Kitchen PHENYTOIN SODIUM EXTENDED 100 MG PO CAPS Oral Take 200-300 mg by mouth See admin instructions. 3 caps qhs on Sundays and Thursdays; 2 caps qhs on all other days      BP 126/73  Pulse 73  Temp(Src) 99.4 F (37.4 C) (Oral)  Resp 18  SpO2 99%  Physical Exam  Nursing note and vitals reviewed. Constitutional: He is oriented to person,  place, and time. Vital signs are normal. He appears well-developed and well-nourished.  Non-toxic appearance. No distress.  HENT:  Head: Normocephalic and atraumatic.  Eyes: Conjunctivae, EOM and lids are normal. Pupils are equal, round, and reactive to light.  Neck: Normal range of motion. Neck supple. No tracheal deviation present. No mass present.  Cardiovascular: Normal rate, regular rhythm and normal heart sounds.  Exam reveals no gallop.   No murmur heard. Pulmonary/Chest: Effort normal and breath sounds normal. No stridor. No respiratory distress. He has no decreased breath sounds. He has no wheezes. He has no rhonchi. He has no rales.  Abdominal: Soft. Normal appearance and bowel sounds are normal. He exhibits no distension. There is no tenderness. There is no rebound and no CVA tenderness.  Musculoskeletal: Normal range of motion. He exhibits no edema and no tenderness.  Neurological: He is alert and oriented to person, place, and time. He has normal strength. No cranial nerve deficit or sensory deficit. GCS eye subscore is 4. GCS verbal subscore is 5. GCS motor subscore is 6.  Skin: Skin is warm and dry. No abrasion and no rash noted.  Psychiatric: He has a normal mood and affect. His speech is normal and behavior is normal.    ED Course  Procedures (including critical care time)   Labs Reviewed  PHENYTOIN LEVEL, TOTAL   No results found.  No diagnosis found.    MDM  Patient given Ativan for seizure activity here. Loaded with Dilantin. Dilantin level is pending at this time also check head CT. Patient signed out to Dr. Maryagnes Amos, MD 12/07/11 [phone]

## 2011-12-08 ENCOUNTER — Encounter (HOSPITAL_COMMUNITY): Payer: Self-pay

## 2011-12-08 ENCOUNTER — Inpatient Hospital Stay (HOSPITAL_COMMUNITY): Payer: Medicare Other

## 2011-12-08 ENCOUNTER — Emergency Department (HOSPITAL_COMMUNITY): Payer: Medicare Other

## 2011-12-08 DIAGNOSIS — I10 Essential (primary) hypertension: Secondary | ICD-10-CM

## 2011-12-08 DIAGNOSIS — G9340 Encephalopathy, unspecified: Secondary | ICD-10-CM

## 2011-12-08 DIAGNOSIS — R569 Unspecified convulsions: Secondary | ICD-10-CM | POA: Diagnosis present

## 2011-12-08 LAB — URINALYSIS, ROUTINE W REFLEX MICROSCOPIC
Bilirubin Urine: NEGATIVE
Ketones, ur: NEGATIVE mg/dL
Nitrite: NEGATIVE
Urobilinogen, UA: 0.2 mg/dL (ref 0.0–1.0)
pH: 7 (ref 5.0–8.0)

## 2011-12-08 LAB — CBC
MCHC: 31.6 g/dL (ref 30.0–36.0)
Platelets: 212 10*3/uL (ref 150–400)
RDW: 12.8 % (ref 11.5–15.5)
WBC: 5.4 10*3/uL (ref 4.0–10.5)

## 2011-12-08 LAB — COMPREHENSIVE METABOLIC PANEL
Alkaline Phosphatase: 102 U/L (ref 39–117)
BUN: 12 mg/dL (ref 6–23)
GFR calc Af Amer: >90 mL/min (ref >90)
GFR calc non Af Amer: 78 mL/min — ABNORMAL LOW (ref >90)
Glucose, Bld: 97 mg/dL (ref 70–99)
Potassium: 4.5 mEq/L (ref 3.5–5.1)
Total Protein: 7.1 g/dL (ref 6.0–8.3)

## 2011-12-08 LAB — PHENYTOIN LEVEL, TOTAL: Phenytoin Lvl: 9.9 ug/mL — ABNORMAL LOW (ref 10.0–20.0)

## 2011-12-08 LAB — CREATININE, SERUM
GFR calc Af Amer: 89 mL/min — ABNORMAL LOW (ref >90)
GFR calc non Af Amer: 77 mL/min — ABNORMAL LOW (ref >90)

## 2011-12-08 LAB — APTT: aPTT: 32 seconds (ref 24–37)

## 2011-12-08 MED ORDER — PHENYTOIN SODIUM 50 MG/ML IJ SOLN
100.0000 mg | Freq: Three times a day (TID) | INTRAMUSCULAR | Status: DC
  Filled 2011-12-08 (×2): qty 2

## 2011-12-08 MED ORDER — FLEET ENEMA 7-19 GM/118ML RE ENEM: 1.0000 | ENEMA | Freq: Once | RECTAL | Status: AC | PRN

## 2011-12-08 MED ORDER — POTASSIUM CHLORIDE IN NACL 20-0.9 MEQ/L-% IV SOLN
INTRAVENOUS | Status: DC
  Administered 2011-12-08 (×4): via INTRAVENOUS
  Filled 2011-12-08 (×3): qty 1000

## 2011-12-08 MED ORDER — PHENYTOIN SODIUM EXTENDED 100 MG PO CAPS
200.0000 mg | ORAL_CAPSULE | Freq: Every day | ORAL | Status: DC
  Administered 2011-12-08: 200 mg via ORAL
  Filled 2011-12-08 (×2): qty 2

## 2011-12-08 MED ORDER — PHENYTOIN 50 MG PO CHEW
50.0000 mg | CHEWABLE_TABLET | Freq: Every day | ORAL | Status: DC
  Administered 2011-12-08: 50 mg via ORAL
  Filled 2011-12-08 (×2): qty 1

## 2011-12-08 MED ORDER — PHENYTOIN SODIUM EXTENDED 30 MG PO CAPS: 250.0000 mg | ORAL_CAPSULE | Freq: Every day | ORAL | Status: DC

## 2011-12-08 MED ORDER — ATENOLOL 25 MG PO TABS
25.0000 mg | ORAL_TABLET | Freq: Every day | ORAL | Status: DC
  Administered 2011-12-08 – 2011-12-09 (×2): 25 mg via ORAL
  Filled 2011-12-08 (×2): qty 1

## 2011-12-08 MED ORDER — PHENYTOIN SODIUM 50 MG/ML IJ SOLN
100.0000 mg | Freq: Once | INTRAMUSCULAR | Status: AC
  Administered 2011-12-08: 100 mg via INTRAVENOUS
  Filled 2011-12-08: qty 2

## 2011-12-08 MED ORDER — ENOXAPARIN SODIUM 40 MG/0.4ML ~~LOC~~ SOLN
40.0000 mg | SUBCUTANEOUS | Status: DC
  Administered 2011-12-08 – 2011-12-09 (×2): 40 mg via SUBCUTANEOUS
  Filled 2011-12-08 (×2): qty 0.4

## 2011-12-08 NOTE — H&P (Signed)
PCP:   No primary provider on file.   History is compromised because of patient's mental Status  Chief Complaint:  Recurrent seizures  HPI: Carlos Shields is an 78 y.o. male.   Apparently with known seizure history,  but seizure-free for the past year on phenytoin. Had a grand mal seizure at home and was then brought to the emergency room where he had another witnessed seizure. He was given 2 mg of Ativan and loaded with Dilantin. CT scan of the brain failed to reveal any acute abnormality. But over some 8 hours in the emergency room patient has failed to wake up, but is having a presumably prolonged postictal state and the hospitalist service was called to assist with management.  In attempting to interview the patient he's able to indicate that he knows he's in hospital, unable to state which hospital, unable to give any other pertinent information related to his medical problems  Rewiew of Systems:  Unable to obtain  Past Medical History  Diagnosis Date  . Hypertension   . Seizures   . Pancreatitis     Past Surgical History  Procedure Date  . Hernia repair     Medications:  HOME MEDS: Prior to Admission medications   Medication Sig Start Date End Date Taking? Authorizing Provider  atenolol (TENORMIN) 25 MG tablet Take 25 mg by mouth daily.   Yes Historical Provider, MD  phenytoin (DILANTIN) 100 MG ER capsule Take 200-300 mg by mouth See admin instructions. 3 caps qhs on Sundays and Thursdays; 2 caps qhs on all other days   Yes Historical Provider, MD     Allergies:  No Known Allergies  Social History:   reports that he has been smoking Cigars.  He does not have any smokeless tobacco history on file. He reports that he does not drink alcohol or use illicit drugs.  Family History: Family History  Problem Relation Age of Onset  . Hypertension Other   . Diabetes Other   . Asthma Other      Physical Exam: Filed Vitals:   12/07/11 2005 12/07/11 2015 12/08/11 0215  BP:   126/73 125/56  Pulse:  73 70  Temp:  99.4 F (37.4 C)   TempSrc:  Oral   Resp:  18 16  SpO2: 100% 99% 100%   Blood pressure 125/56, pulse 70, temperature 99.4 F (37.4 C), temperature source Oral, resp. rate 16, SpO2 100.00%.  GEN: Drowsy confused elderly African American gentleman lying in the stretcher in no acute distress; not cooperative with exam PSYCH:   Unable to obtain further HEENT: Mucous membranes pink, dry and anicteric; PERRLA; EOM intact; no cervical lymphadenopathy nor thyromegaly or carotid bruit; no JVD; Breasts:: Not examined CHEST WALL: No tenderness CHEST: Normal respiration, clear to auscultation bilaterally HEART: Regular rate and rhythm; no murmurs rubs or gallops BACK: no CVA tenderness ABDOMEN:  soft non-tender; no masses, no organomegaly, normal abdominal bowel sounds; no pannus; no intertriginous candida. Rectal Exam: Not done EXTREMITIES:  age-appropriate arthropathy of the especiallyhands and knees; no edema; no ulcerations. Genitalia: not examined PULSES: 2+ and symmetric SKIN: Normal hydration no rash or ulceration CNS: Cranial nerves 2-12 grossly intact no obvious  focal lateralizing neurologic deficit   Labs & Imaging Results for orders placed during the hospital encounter of 12/07/11 (from the past 48 hour(s))  PHENYTOIN LEVEL, TOTAL     Status: Abnormal   Collection Time   12/07/11  9:41 PM      Component Value Range Comment  Phenytoin Lvl 9.9 (*) 10.0 - 20.0 (ug/mL)   CBC     Status: Abnormal   Collection Time   12/07/11  9:42 PM      Component Value Range Comment   WBC 5.4  4.0 - 10.5 (K/uL)    RBC 2.89 (*) 4.22 - 5.81 (MIL/uL)    Hemoglobin 8.4 (*) 13.0 - 17.0 (g/dL)    HCT 16.1 (*) 09.6 - 52.0 (%)    MCV 92.0  78.0 - 100.0 (fL)    MCH 29.1  26.0 - 34.0 (pg)    MCHC 31.6  30.0 - 36.0 (g/dL)    RDW 04.5  40.9 - 81.1 (%)    Platelets 212  150 - 400 (K/uL)   POCT I-STAT, CHEM 8     Status: Abnormal   Collection Time   12/07/11  9:46  PM      Component Value Range Comment   Sodium 141  135 - 145 (mEq/L)    Potassium 4.2  3.5 - 5.1 (mEq/L)    Chloride 108  96 - 112 (mEq/L)    BUN 22  6 - 23 (mg/dL)    Creatinine, Ser 9.14  0.50 - 1.35 (mg/dL)    Glucose, Bld 782 (*) 70 - 99 (mg/dL)    Calcium, Ion 9.56  1.12 - 1.32 (mmol/L)    TCO2 23  0 - 100 (mmol/L)    Hemoglobin 9.5 (*) 13.0 - 17.0 (g/dL)    HCT 21.3 (*) 08.6 - 52.0 (%)    Ct Head Wo Contrast  12/08/2011  *RADIOLOGY REPORT*  Clinical Data: Seizures.  CT HEAD WITHOUT CONTRAST  Technique:  Contiguous axial images were obtained from the base of the skull through the vertex without contrast.  Comparison: None.  Findings: Technically limited study due to motion artifact.  There is diffuse cerebral atrophy.  Ventricular dilatation likely representing central atrophy although normal pressure hydrocephalus is not excluded.  Low attenuation changes throughout the deep white matter consistent with small vessel ischemia.  No mass effect or midline shift.  No abnormal extra-axial fluid collections.  Wallace Cullens- white matter junctions are distinct.  Basal cisterns are not effaced.  No acute intracranial hemorrhage.  Vascular calcifications.  Diffuse opacification of the maxillary antra and ethmoid air cells and frontal sinuses with opacification of the left sphenoid sinuses.  Mastoid air cells are are not opacified. Vascular calcifications.  IMPRESSION: No acute intracranial abnormalities.  Diffuse atrophy and small vessel ischemic changes.  Opacification of the paranasal sinuses.  Original Report Authenticated By: Marlon Pel, M.D.      Assessment Present on Admission:  .Seizures .HTN (hypertension) .Encephalopathy Possible sinusituis  PLAN:  bring this gentleman on to telemetry unit for recovery of his vital signs; although it's possible that he is taking a long time to clear Ativan from his system, it's also possible he has some other neurologic disorder; will therefore it  gets that MRI evaluation.   keep him n.p.o. while he's this drowsy  Other plans as per orders.    Wyolene Weimann 12/08/2011, 5:16 AM

## 2011-12-08 NOTE — ED Notes (Signed)
Family went home  Contact # Welton Flakes [phone]

## 2011-12-08 NOTE — ED Notes (Signed)
MD at bedside. 

## 2011-12-08 NOTE — Care Management Note (Signed)
    Page 1 of 1   12/09/2011     11:03:54 AM   CARE MANAGEMENT NOTE 12/09/2011  Patient:  Carlos Shields, Carlos Shields   Account Number:  [id number]  Date Initiated:  12/08/2011  Documentation initiated by:  Lanier Clam  Subjective/Objective Assessment:   ADMITTED W/SEIZURE.ZO:[id number]     Action/Plan:   FROM HOME W/SUPPORT.HAS CANE. CURRENT PCP-DR. KILPATRICK.   Anticipated DC Date:  12/09/2011   Anticipated DC Plan:  HOME/SELF CARE  In-house referral  PCP / [facility]      DC Planning Services  CM consult      Choice offered to / List presented to:             Status of service:  Completed, signed off Medicare Important Message given?   (If response is "NO", the following Medicare IM given date fields will be blank) Date Medicare IM given:   Date Additional Medicare IM given:    Discharge Disposition:  HOME/SELF CARE  Per UR Regulation:  Reviewed for med. necessity/level of care/duration of stay  If discussed at Long Length of Stay Meetings, dates discussed:    Comments:  12/08/11 Kassaundra Hair RN,BSN NCM [phone] PROVIDED W/PCP/[facility] LISTING AS RESOURCE PER FAMILY REQUEST.

## 2011-12-08 NOTE — Progress Notes (Signed)
Subjective: Patient was sleepy earlier today but more arousable on my examination.  Was able to have a conversation.  Objective: Vital signs in last 24 hours: Filed Vitals:   12/08/11 0730 12/08/11 0745 12/08/11 0858 12/08/11 0910  BP:   140/66 122/68  Pulse: 64 68 75 65  Temp:    99.3 F (37.4 C)  TempSrc:    Oral  Resp: 13 12 17 16   Height:    5\' 2"  (1.575 m)  Weight:    54.069 kg (119 lb 3.2 oz)  SpO2: 96% 99% 98% 100%   Weight change:  No intake or output data in the 24 hours ending 12/08/11 1250  Physical Exam: General: Awake, Oriented to self and location, No acute distress. HEENT: EOMI. Neck: Supple CV: S1 and S2 Lungs: Clear to ascultation bilaterally Abdomen: Soft, Nontender, Nondistended, +bowel sounds. Ext: Good pulses. Trace edema.  Lab Results:  Basename 12/08/11 0616 12/07/11 2146  NA -- 141  K -- 4.2  CL -- 108  CO2 -- --  GLUCOSE -- 108*  BUN -- 22  CREATININE 0.97 1.20  CALCIUM -- --  MG -- --  PHOS -- --   No results found for this basename: AST:2,ALT:2,ALKPHOS:2,BILITOT:2,PROT:2,ALBUMIN:2 in the last 72 hours No results found for this basename: LIPASE:2,AMYLASE:2 in the last 72 hours  Basename 12/07/11 2146 12/07/11 2142  WBC -- 5.4  NEUTROABS -- --  HGB 9.5* 8.4*  HCT 28.0* 26.6*  MCV -- 92.0  PLT -- 212   No results found for this basename: CKTOTAL:3,CKMB:3,CKMBINDEX:3,TROPONINI:3 in the last 72 hours No components found with this basename: POCBNP:3 No results found for this basename: DDIMER:2 in the last 72 hours No results found for this basename: HGBA1C:2 in the last 72 hours No results found for this basename: CHOL:2,HDL:2,LDLCALC:2,TRIG:2,CHOLHDL:2,LDLDIRECT:2 in the last 72 hours  Basename 12/08/11 0616  TSH 2.388  T4TOTAL --  T3FREE --  THYROIDAB --   No results found for this basename: VITAMINB12:2,FOLATE:2,FERRITIN:2,TIBC:2,IRON:2,RETICCTPCT:2 in the last 72 hours  Micro Results: No results found for this or any  previous visit (from the past 240 hour(s)).  Studies/Results: Ct Head Wo Contrast  12/08/2011  *RADIOLOGY REPORT*  Clinical Data: Seizures.  CT HEAD WITHOUT CONTRAST  Technique:  Contiguous axial images were obtained from the base of the skull through the vertex without contrast.  Comparison: None.  Findings: Technically limited study due to motion artifact.  There is diffuse cerebral atrophy.  Ventricular dilatation likely representing central atrophy although normal pressure hydrocephalus is not excluded.  Low attenuation changes throughout the deep white matter consistent with small vessel ischemia.  No mass effect or midline shift.  No abnormal extra-axial fluid collections.  Wallace Cullens- white matter junctions are distinct.  Basal cisterns are not effaced.  No acute intracranial hemorrhage.  Vascular calcifications.  Diffuse opacification of the maxillary antra and ethmoid air cells and frontal sinuses with opacification of the left sphenoid sinuses.  Mastoid air cells are are not opacified. Vascular calcifications.  IMPRESSION: No acute intracranial abnormalities.  Diffuse atrophy and small vessel ischemic changes.  Opacification of the paranasal sinuses.  Original Report Authenticated By: Marlon Pel, M.D.   Mr Brain Wo Contrast  12/08/2011  *RADIOLOGY REPORT*  Clinical Data: Witnessed seizure.  History of seizures. Hypertension.  MRI HEAD WITHOUT CONTRAST  Technique:  Multiplanar, multiecho pulse sequences of the brain and surrounding structures were obtained according to standard protocol without intravenous contrast.  Comparison: 12/08/2011 head CT.  Findings: Motion degraded exam.  No acute infarct.  No intracranial hemorrhage.  Remote small thalamic and basal ganglia infarcts.  Moderate small vessel disease type changes.  Global atrophy.  Ventricular prominence may be related to central atrophy rather than hydrocephalus.  4.6 mm posterior right pituitary cystic-appearing structure possibly an  incidentally detected pituitary cyst versus adenoma. No other intracranial mass lesion noted on this unenhanced motion degraded exam.  Left hippocampus minimally smaller than the right without definitive findings of mesial temporal sclerosis.  Paranasal sinus opacification with complete opacification of the frontal sinuses, majority of ethmoid sinus air cells, portion of the right sphenoid sinus and maxillary sinus bilaterally.  Major intracranial vascular structures are patent.  IMPRESSION: Motion degraded exam.  No acute infarct.  No intracranial hemorrhage.  Remote small thalamic and basal ganglia infarcts.  Moderate small vessel disease type changes.  Global atrophy.  Ventricular prominence may be related to central atrophy rather than hydrocephalus.  4.6 mm posterior right pituitary cystic-appearing structure possibly an incidentally detected pituitary cyst versus adenoma.  Left hippocampus minimally smaller than the right without definitive findings of mesial temporal sclerosis.  Paranasal sinus opacification with complete opacification of the frontal sinuses, majority of ethmoid sinus air cells, portion of the right sphenoid sinus and maxillary sinus bilaterally.  Original Report Authenticated By: Fuller Canada, M.D.    Medications: I have reviewed the patient's current medications. Scheduled Meds:   . atenolol  25 mg Oral Daily  . enoxaparin  40 mg Subcutaneous Q24H  . LORazepam      . LORazepam  2 mg Intravenous Once  . phenytoin (DILANTIN) IV  1,000 mg Intravenous Once  . phenytoin  200 mg Oral QHS   And  . phenytoin  50 mg Oral QHS  . phenytoin (DILANTIN) IV  100 mg Intravenous Once  . DISCONTD: phenytoin  250 mg Oral QHS  . DISCONTD: phenytoin (DILANTIN) IV  100 mg Intravenous Q8H   Continuous Infusions:   . 0.9 % NaCl with KCl 20 mEq / L 50 mL/hr at 12/08/11 1243   PRN Meds:.sodium phosphate  Assessment/Plan: Seizures Patient had MRI of the brain on 12/08/2011 which did not  show any acute changes, MRI did show strokes.  Dilantin level on admission was low at 9.9. Discussed with Dr. Vonita Moss, neurology, recommended increasing Dilantin to 250 mg po qhs (home dose 200 mg qhs except on Sunday 300 mg po qhs) as a Dilantin level was slightly subtherapeutic on admission.  No need to add another agent.  HTN (hypertension) Resume home antihypertensive medication.  Altered mental status/acute delirium/Encephalopathy Likely due to seizure.  Improved.  Prophylaxis Lovenox.  Disposition Discharge the patient tomorrow if no further episodes of seizures.   LOS: 1 day  Janaye Corp A, MD 12/08/2011, 12:50 PM

## 2011-12-08 NOTE — ED Notes (Signed)
Dilantin infusion complete.  Pt resting without distress noted  Family at bedside

## 2011-12-08 NOTE — ED Notes (Signed)
Returned from MRI 

## 2011-12-08 NOTE — ED Notes (Signed)
Patient transported to MRI 

## 2011-12-08 NOTE — ED Notes (Signed)
AM labs collected.

## 2011-12-08 NOTE — Progress Notes (Signed)
MEDICATION RELATED CONSULT NOTE - INITIAL   Pharmacy Consult for Dilantin Indication: Recurrent Seizures  No Known Allergies  Patient Measurements: Height: 5\' 2"  (157.5 cm) Weight: 119 lb 3.2 oz (54.069 kg) IBW/kg (Calculated) : 54.6   Vital Signs: Temp: 99.3 F (37.4 C) (05/29 0910) Temp src: Oral (05/29 0910) BP: 122/68 mmHg (05/29 0910) Pulse Rate: 65  (05/29 0910) Intake/Output from previous day:   Intake/Output from this shift:    Labs:  Basename 12/08/11 0616 12/07/11 2146 12/07/11 2142  WBC -- -- 5.4  HGB -- 9.5* 8.4*  HCT -- 28.0* 26.6*  PLT -- -- 212  APTT 32 -- --  CREATININE 0.97 1.20 --  LABCREA -- -- --  CREATININE 0.97 1.20 --  CREAT24HRUR -- -- --  MG -- -- --  PHOS -- -- --  ALBUMIN -- -- --  PROT -- -- --  ALBUMIN -- -- --  AST -- -- --  ALT -- -- --  ALKPHOS -- -- --  BILITOT -- -- --  BILIDIR -- -- --  IBILI -- -- --   Estimated Creatinine Clearance: 48 ml/min (by C-G formula based on Cr of 0.97).   Microbiology: No results found for this or any previous visit (from the past 720 hour(s)).  Medical History: Past Medical History  Diagnosis Date  . Hypertension   . Seizures   . Pancreatitis     Medications:  Scheduled:    . enoxaparin  40 mg Subcutaneous Q24H  . LORazepam      . LORazepam  2 mg Intravenous Once  . phenytoin (DILANTIN) IV  1,000 mg Intravenous Once   Infusions:    . 0.9 % NaCl with KCl 20 mEq / L 100 mL/hr at 12/08/11 0744   PRN: sodium phosphate  Assessment: 78 yo M with known seizure history, reportedly seizure-free for the last year on phenytoin (although note from ER RN indicates EMS was told the patient's last seizure was about a month ago), had a grand mal seizure at home and another witnessed seizure in the ER on 5/28 ~10pm.  Was given Lorazepam 2mg  IV and phenytoin 1g IV x1 (~18 mg/kg) in ER between 22:00-23:00 on 5/28. Phenytoin level = 9.9 on 5/28 at 21:41 (which is essentially therapeutic). Home  phenytoin dose reported as 300mg  PO qhs on Sundays and Thursdays, and 200mg  PO QHS on Mon,Tue,Wed,Fri, and Saturdays - last dose reportedly taken 5/27.  Pharmacy now asked to consult with phenytoin dosing.  Goal of Therapy:  Phenytoin level 10-20 mcg/ml (will aim for higher end of goal range given therapeutic admit level)  Plan:  1) Check phenytoin level at 18:00 tonight to insure adequate load. (although phenytoin level on admit was already essentially therapeutic). 2) Dilantin 100mg  IV TID (~6mg /kg/day) 3) CMET today - need albumin, LFTs, SCr 4) Not sure if there are alternate reasons for seizure, since admission phenytoin level was on the low side of therapeutic range?  Darrol Angel, PharmD Pager: [phone] 12/08/2011,9:51 AM

## 2011-12-09 DIAGNOSIS — G9340 Encephalopathy, unspecified: Secondary | ICD-10-CM

## 2011-12-09 DIAGNOSIS — D649 Anemia, unspecified: Secondary | ICD-10-CM | POA: Diagnosis present

## 2011-12-09 DIAGNOSIS — I10 Essential (primary) hypertension: Secondary | ICD-10-CM

## 2011-12-09 DIAGNOSIS — R569 Unspecified convulsions: Secondary | ICD-10-CM

## 2011-12-09 LAB — COMPREHENSIVE METABOLIC PANEL
ALT: 16 U/L (ref 0–53)
AST: 25 U/L (ref 0–37)
Albumin: 3 g/dL — ABNORMAL LOW (ref 3.5–5.2)
CO2: 23 mEq/L (ref 19–32)
Calcium: 8 mg/dL — ABNORMAL LOW (ref 8.4–10.5)
GFR calc non Af Amer: 77 mL/min — ABNORMAL LOW (ref >90)
Sodium: 134 mEq/L — ABNORMAL LOW (ref 135–145)
Total Protein: 6.7 g/dL (ref 6.0–8.3)

## 2011-12-09 LAB — CBC
HCT: 29.1 % — ABNORMAL LOW (ref 39.0–52.0)
Hemoglobin: 9.5 g/dL — ABNORMAL LOW (ref 13.0–17.0)
MCH: 29.9 pg (ref 26.0–34.0)
MCHC: 32.6 g/dL (ref 30.0–36.0)
MCV: 91.5 fL (ref 78.0–100.0)

## 2011-12-09 LAB — IRON AND TIBC
Saturation Ratios: 19 % — ABNORMAL LOW (ref 20–55)
TIBC: 240 ug/dL (ref 215–435)
UIBC: 195 ug/dL (ref 125–400)

## 2011-12-09 LAB — RETICULOCYTES: Retic Count, Absolute: 28.6 10*3/uL (ref 19.0–186.0)

## 2011-12-09 MED ORDER — PHENYTOIN SODIUM EXTENDED 200 MG PO CAPS: 250.0000 mg | ORAL_CAPSULE | Freq: Every day | ORAL | Status: DC

## 2011-12-09 MED ORDER — POLYSACCHARIDE IRON COMPLEX 150 MG PO CAPS: 150.0000 mg | ORAL_CAPSULE | Freq: Every day | ORAL | Status: DC

## 2011-12-09 MED ORDER — POLYSACCHARIDE IRON COMPLEX 150 MG PO CAPS
150.0000 mg | ORAL_CAPSULE | Freq: Every day | ORAL | Status: DC
  Administered 2011-12-09: 150 mg via ORAL
  Filled 2011-12-09: qty 1

## 2011-12-09 NOTE — Progress Notes (Signed)
MEDICATION RELATED CONSULT NOTE - Follow Up   Pharmacy Consult for Dilantin Indication: Recurrent Seizures  No Known Allergies  Patient Measurements: Height: 5\' 2"  (157.5 cm) Weight: 114 lb 3.2 oz (51.801 kg) IBW/kg (Calculated) : 54.6   Vital Signs: Temp: 98.2 F (36.8 C) (05/30 0519) Temp src: Oral (05/30 0519) BP: 112/59 mmHg (05/30 0519) Pulse Rate: 63  (05/30 0519)  Labs:  Basename 12/09/11 0503 12/08/11 1800 12/08/11 0616 12/07/11 2146 12/07/11 2142  WBC 5.2 -- -- -- 5.4  HGB 9.5* -- -- 9.5* 8.4*  HCT 29.1* -- -- 28.0* 26.6*  PLT 198 -- -- -- 212  APTT -- -- 32 -- --  CREATININE 0.98 0.93 0.97 -- --  LABCREA -- -- -- -- --  CREATININE 0.98 0.93 0.97 -- --  CREAT24HRUR -- -- -- -- --  MG -- -- -- -- --  PHOS -- -- -- -- --  ALBUMIN 3.0* 3.2* -- -- --  PROT 6.7 7.1 -- -- --  ALBUMIN 3.0* 3.2* -- -- --  AST 25 28 -- -- --  ALT 16 17 -- -- --  ALKPHOS 91 102 -- -- --  BILITOT 0.4 0.5 -- -- --  BILIDIR -- -- -- -- --  IBILI -- -- -- -- --   Estimated Creatinine Clearance: 45.5 ml/min (by C-G formula based on Cr of 0.98).   Microbiology: No results found for this or any previous visit (from the past 720 hour(s)).  Medical History: Past Medical History  Diagnosis Date  . Hypertension   . Seizures   . Pancreatitis     Medications:  Scheduled:     . atenolol  25 mg Oral Daily  . enoxaparin  40 mg Subcutaneous Q24H  . LORazepam      . phenytoin  200 mg Oral QHS   And  . phenytoin  50 mg Oral QHS  . phenytoin (DILANTIN) IV  100 mg Intravenous Once  . DISCONTD: phenytoin  250 mg Oral QHS  . DISCONTD: phenytoin (DILANTIN) IV  100 mg Intravenous Q8H   Infusions:     . 0.9 % NaCl with KCl 20 mEq / L 50 mL/hr at 12/08/11 2110   PRN: sodium phosphate  Assessment: 78 yo M with known seizure history, reportedly seizure-free for the last year on phenytoin (although note from ER RN indicates EMS was told the patient's last seizure was about a month  ago), had a grand mal seizure at home and another witnessed seizure in the ER on 5/28 ~10pm.  Was given Lorazepam 2mg  IV and phenytoin 1g IV x1 (~18 mg/kg) in ER between 22:00-23:00 on 5/28. Phenytoin level = 9.9 on 5/28 at 21:41 (which is essentially therapeutic). Home phenytoin dose reported as 300mg  PO qhs on Sundays and Thursdays, and 200mg  PO QHS on Mon,Tue,Wed,Fri, and Saturdays - last dose reportedly taken 5/27.  Pharmacy initiated dose to phenytoin 100mg  IV TID yesterday, but dose was changed by MD to phenytoin 250mg  PO QHS per Neuro recs. Dilantin level obtained last night at 18:00 was 26.6 (but corrected for low albumin, actual dilantin level = 36, which is supratherapeutic, meaning patient was more than adequately loaded with Dilantin in ER. Possible discharge today noted. If patient is not experiencing signs of Phenytoin toxicity (which include: Nystagmus, blurred vision, diplopia, ataxia, slurred speech, dizziness, drowsiness, lethargy, coma, rash, fever, nausea, vomiting, gum tenderness, and confusion), then no action needed. If patient is experiencing toxicity symptoms, suggest rechecking a phenytoin level and recontacting Neuro  for recs (since they are the ones who recommended a maintenance dose).  Goal of Therapy:  Phenytoin level 10-20 mcg/ml   Plan:  1) Continue with Dilantin dose per Neuro recs. 2) MD and/or RN - please assess patient for signs/symptoms of phenytoin toxicity - Nystagmus, blurred vision, diplopia, ataxia, slurred speech, dizziness, drowsiness, lethargy, coma, rash, fever, nausea, vomiting, gum tenderness, and confusion. Recheck a phenytoin level if signs/symptoms present. 3) Suggest rechecking a phenytoin level, SCr, and albumin in 5-7 days to assess trend and rechecking another phenytoin level, SCr, and albumin in 10-14 days to assess steady state level.  Darrol Angel, PharmD Pager: [phone] 12/09/2011,9:15 AM

## 2011-12-09 NOTE — Progress Notes (Signed)
Subjective: Patient awake today. Daughter and daughter-in-law by bedside, mentation at baseline.  Objective: Vital signs in last 24 hours: Filed Vitals:   12/09/11 0519 12/09/11 0631 12/09/11 0914 12/09/11 1007  BP: 112/59  110/60 106/63  Pulse: 63  64 68  Temp: 98.2 F (36.8 C)     TempSrc: Oral     Resp: 18   18  Height:      Weight:  51.801 kg (114 lb 3.2 oz)    SpO2: 100%   99%   Weight change:   Intake/Output Summary (Last 24 hours) at 12/09/11 1013 Last data filed at 12/09/11 7829  Gross per 24 hour  Intake    480 ml  Output   2225 ml  Net  -1745 ml    Physical Exam: General: Awake, Oriented x3, No acute distress. HEENT: EOMI. Neck: Supple CV: S1 and S2 Lungs: Clear to ascultation bilaterally Abdomen: Soft, Nontender, Nondistended, +bowel sounds. Ext: Good pulses. Trace edema.  Lab Results:  Basename 12/09/11 0503 12/08/11 1800  NA 134* 137  K 4.2 4.5  CL 102 106  CO2 23 24  GLUCOSE 114* 97  BUN 16 12  CREATININE 0.98 0.93  CALCIUM 8.0* 8.4  MG -- --  PHOS -- --    Basename 12/09/11 0503 12/08/11 1800  AST 25 28  ALT 16 17  ALKPHOS 91 102  BILITOT 0.4 0.5  PROT 6.7 7.1  ALBUMIN 3.0* 3.2*   No results found for this basename: LIPASE:2,AMYLASE:2 in the last 72 hours  Basename 12/09/11 0503 12/07/11 2146 12/07/11 2142  WBC 5.2 -- 5.4  NEUTROABS -- -- --  HGB 9.5* 9.5* --  HCT 29.1* 28.0* --  MCV 91.5 -- 92.0  PLT 198 -- 212   No results found for this basename: CKTOTAL:3,CKMB:3,CKMBINDEX:3,TROPONINI:3 in the last 72 hours No components found with this basename: POCBNP:3 No results found for this basename: DDIMER:2 in the last 72 hours No results found for this basename: HGBA1C:2 in the last 72 hours No results found for this basename: CHOL:2,HDL:2,LDLCALC:2,TRIG:2,CHOLHDL:2,LDLDIRECT:2 in the last 72 hours  Basename 12/08/11 0616  TSH 2.388  T4TOTAL --  T3FREE --  THYROIDAB --    Basename 12/09/11 0503  VITAMINB12 --  FOLATE --    FERRITIN --  TIBC --  IRON --  RETICCTPCT 0.9    Micro Results: No results found for this or any previous visit (from the past 240 hour(s)).  Studies/Results: Ct Head Wo Contrast  12/08/2011  *RADIOLOGY REPORT*  Clinical Data: Seizures.  CT HEAD WITHOUT CONTRAST  Technique:  Contiguous axial images were obtained from the base of the skull through the vertex without contrast.  Comparison: None.  Findings: Technically limited study due to motion artifact.  There is diffuse cerebral atrophy.  Ventricular dilatation likely representing central atrophy although normal pressure hydrocephalus is not excluded.  Low attenuation changes throughout the deep white matter consistent with small vessel ischemia.  No mass effect or midline shift.  No abnormal extra-axial fluid collections.  Wallace Cullens- white matter junctions are distinct.  Basal cisterns are not effaced.  No acute intracranial hemorrhage.  Vascular calcifications.  Diffuse opacification of the maxillary antra and ethmoid air cells and frontal sinuses with opacification of the left sphenoid sinuses.  Mastoid air cells are are not opacified. Vascular calcifications.  IMPRESSION: No acute intracranial abnormalities.  Diffuse atrophy and small vessel ischemic changes.  Opacification of the paranasal sinuses.  Original Report Authenticated By: Marlon Pel, M.D.   Mr  Brain Wo Contrast  12/08/2011  *RADIOLOGY REPORT*  Clinical Data: Witnessed seizure.  History of seizures. Hypertension.  MRI HEAD WITHOUT CONTRAST  Technique:  Multiplanar, multiecho pulse sequences of the brain and surrounding structures were obtained according to standard protocol without intravenous contrast.  Comparison: 12/08/2011 head CT.  Findings: Motion degraded exam.  No acute infarct.  No intracranial hemorrhage.  Remote small thalamic and basal ganglia infarcts.  Moderate small vessel disease type changes.  Global atrophy.  Ventricular prominence may be related to central  atrophy rather than hydrocephalus.  4.6 mm posterior right pituitary cystic-appearing structure possibly an incidentally detected pituitary cyst versus adenoma. No other intracranial mass lesion noted on this unenhanced motion degraded exam.  Left hippocampus minimally smaller than the right without definitive findings of mesial temporal sclerosis.  Paranasal sinus opacification with complete opacification of the frontal sinuses, majority of ethmoid sinus air cells, portion of the right sphenoid sinus and maxillary sinus bilaterally.  Major intracranial vascular structures are patent.  IMPRESSION: Motion degraded exam.  No acute infarct.  No intracranial hemorrhage.  Remote small thalamic and basal ganglia infarcts.  Moderate small vessel disease type changes.  Global atrophy.  Ventricular prominence may be related to central atrophy rather than hydrocephalus.  4.6 mm posterior right pituitary cystic-appearing structure possibly an incidentally detected pituitary cyst versus adenoma.  Left hippocampus minimally smaller than the right without definitive findings of mesial temporal sclerosis.  Paranasal sinus opacification with complete opacification of the frontal sinuses, majority of ethmoid sinus air cells, portion of the right sphenoid sinus and maxillary sinus bilaterally.  Original Report Authenticated By: Fuller Canada, M.D.    Medications: I have reviewed the patient's current medications. Scheduled Meds:    . atenolol  25 mg Oral Daily  . enoxaparin  40 mg Subcutaneous Q24H  . LORazepam      . phenytoin  200 mg Oral QHS   And  . phenytoin  50 mg Oral QHS  . phenytoin (DILANTIN) IV  100 mg Intravenous Once  . DISCONTD: phenytoin  250 mg Oral QHS  . DISCONTD: phenytoin (DILANTIN) IV  100 mg Intravenous Q8H   Continuous Infusions:    . 0.9 % NaCl with KCl 20 mEq / L 50 mL/hr at 12/08/11 2110   PRN Meds:.sodium phosphate  Assessment/Plan: Seizures Patient had MRI of the brain on  12/08/2011 which did not show any acute changes, MRI did show old strokes.  Dilantin level on admission was low at 9.9. Discussed with Dr. Vonita Moss, neurology, recommended increasing Dilantin to 250 mg po qhs (home dose 200 mg qhs except on Sunday 300 mg po qhs) as a Dilantin level was slightly subtherapeutic on admission.  No need to add another agent. Mentation back to baseline. Instructed him to checked with his PCP Dr. Petra Kuba in 1 week for albumin, dilantin level and CBC.  HTN (hypertension) Resume home antihypertensive medication.  Altered mental status/acute delirium/Encephalopathy Likely due to seizure.  Improved.  Anemia Hemoglobin stable. Anemia panel pending, but start supplemental iron, PCP to followup on anemia panel. Uncertain if dilantin may be causing anemia. May need a colonscopy if not done in the last 5-10 years.  Prophylaxis Lovenox.  Disposition Discharge the patient today.   LOS: 2 days  Flynn Gwyn A, MD 12/09/2011, 10:13 AM

## 2011-12-09 NOTE — Discharge Summary (Addendum)
Discharge Summary  Carlos Shields MR#: [id number]  DOB:[birth year]  Date of Admission: 12/07/2011 Date of Discharge: 12/09/2011  Patient's PCP: Eino Farber, MD, MD  Attending Physician:Tayli Buch A  Consults: Dr. Roseanne Reno, neurology, by telephone.  Discharge Diagnoses: Active Problems:  Seizures  HTN (hypertension)  Encephalopathy  Anemia  Brief Admitting History and Physical Carlos Shields is an 78 y.o. Male, with known seizure history, but seizure-free for the past year on phenytoin. Had a seizure at home and was then brought to the emergency room where he had another witnessed seizure on 12/08/2011.   Discharge Medications Medication List  As of 12/09/2011 10:28 AM   TAKE these medications         atenolol 25 MG tablet   Commonly known as: TENORMIN   Take 25 mg by mouth daily.      iron polysaccharides 150 MG capsule   Commonly known as: NIFEREX   Take 1 capsule (150 mg total) by mouth daily.      phenytoin 200 MG ER capsule   Commonly known as: DILANTIN   Take 1 capsule (200 mg total) by mouth at bedtime. Total dose 250 mg (ok to prescribe 200 mg and 50 mg tablets)            Hospital Course: Seizures Patient had MRI of the brain on 12/08/2011 which did not show any acute changes, MRI did show old strokes.  Dilantin level on admission was low at 9.9. Discussed with Dr. Vonita Moss, neurology, recommended increasing Dilantin to 250 mg po qhs (home dose 200 mg qhs except on Thursday and Sunday 300 mg po qhs) as a Dilantin level was slightly subtherapeutic on admission.  No need to add another agent. Mentation back to baseline on 12/09/2011. Instructed him to checked with his PCP Dr. Petra Kuba in 1 week for albumin, dilantin level and CBC.  Will defer to primary care physician whether the patient would benefit from a neurology referral as outpatient or not.  Patient was instructed not to drive for atleast 6 months, he reported that he does not drive anyway, he expressed  understanding.  HTN (hypertension) Resume home antihypertensive medication.  Altered mental status/acute delirium/Encephalopathy Likely due to seizure.  Improved.  Anemia Hemoglobin stable. Anemia panel pending, but start supplemental iron, PCP to followup on anemia panel. Uncertain if dilantin may be causing anemia. May need a colonscopy if not done in the last 5-10 years.  Day of Discharge BP 106/63  Pulse 68  Temp(Src) 98.5 F (36.9 C) (Oral)  Resp 18  Ht 5\' 2"  (1.575 m)  Wt 51.801 kg (114 lb 3.2 oz)  BMI 20.89 kg/m2  SpO2 99%  Results for orders placed during the hospital encounter of 12/07/11 (from the past 48 hour(s))  PHENYTOIN LEVEL, TOTAL     Status: Abnormal   Collection Time   12/07/11  9:41 PM      Component Value Range Comment   Phenytoin Lvl 9.9 (*) 10.0 - 20.0 (ug/mL)   CBC     Status: Abnormal   Collection Time   12/07/11  9:42 PM      Component Value Range Comment   WBC 5.4  4.0 - 10.5 (K/uL)    RBC 2.89 (*) 4.22 - 5.81 (MIL/uL)    Hemoglobin 8.4 (*) 13.0 - 17.0 (g/dL)    HCT 11.9 (*) 14.7 - 52.0 (%)    MCV 92.0  78.0 - 100.0 (fL)    MCH 29.1  26.0 - 34.0 (pg)  MCHC 31.6  30.0 - 36.0 (g/dL)    RDW 56.2  13.0 - 86.5 (%)    Platelets 212  150 - 400 (K/uL)   POCT I-STAT, CHEM 8     Status: Abnormal   Collection Time   12/07/11  9:46 PM      Component Value Range Comment   Sodium 141  135 - 145 (mEq/L)    Potassium 4.2  3.5 - 5.1 (mEq/L)    Chloride 108  96 - 112 (mEq/L)    BUN 22  6 - 23 (mg/dL)    Creatinine, Ser 7.84  0.50 - 1.35 (mg/dL)    Glucose, Bld 696 (*) 70 - 99 (mg/dL)    Calcium, Ion 2.95  1.12 - 1.32 (mmol/L)    TCO2 23  0 - 100 (mmol/L)    Hemoglobin 9.5 (*) 13.0 - 17.0 (g/dL)    HCT 28.4 (*) 13.2 - 52.0 (%)   CREATININE, SERUM     Status: Abnormal   Collection Time   12/08/11  6:16 AM      Component Value Range Comment   Creatinine, Ser 0.97  0.50 - 1.35 (mg/dL)    GFR calc non Af Amer 77 (*) >90 (mL/min)    GFR calc Af Amer 89  (*) >90 (mL/min)   PROTIME-INR     Status: Abnormal   Collection Time   12/08/11  6:16 AM      Component Value Range Comment   Prothrombin Time 16.8 (*) 11.6 - 15.2 (seconds)    INR 1.34  0.00 - 1.49    TSH     Status: Normal   Collection Time   12/08/11  6:16 AM      Component Value Range Comment   TSH 2.388  0.350 - 4.500 (uIU/mL)   APTT     Status: Normal   Collection Time   12/08/11  6:16 AM      Component Value Range Comment   aPTT 32  24 - 37 (seconds)   URINALYSIS, ROUTINE W REFLEX MICROSCOPIC     Status: Normal   Collection Time   12/08/11 11:27 AM      Component Value Range Comment   Color, Urine YELLOW  YELLOW     APPearance CLEAR  CLEAR     Specific Gravity, Urine 1.010  1.005 - 1.030     pH 7.0  5.0 - 8.0     Glucose, UA NEGATIVE  NEGATIVE (mg/dL)    Hgb urine dipstick NEGATIVE  NEGATIVE     Bilirubin Urine NEGATIVE  NEGATIVE     Ketones, ur NEGATIVE  NEGATIVE (mg/dL)    Protein, ur NEGATIVE  NEGATIVE (mg/dL)    Urobilinogen, UA 0.2  0.0 - 1.0 (mg/dL)    Nitrite NEGATIVE  NEGATIVE     Leukocytes, UA NEGATIVE  NEGATIVE  MICROSCOPIC NOT DONE ON URINES WITH NEGATIVE PROTEIN, BLOOD, LEUKOCYTES, NITRITE, OR GLUCOSE <1000 mg/dL.  PHENYTOIN LEVEL, TOTAL     Status: Abnormal   Collection Time   12/08/11  6:00 PM      Component Value Range Comment   Phenytoin Lvl 26.6 (*) 10.0 - 20.0 (ug/mL)   COMPREHENSIVE METABOLIC PANEL     Status: Abnormal   Collection Time   12/08/11  6:00 PM      Component Value Range Comment   Sodium 137  135 - 145 (mEq/L)    Potassium 4.5  3.5 - 5.1 (mEq/L)    Chloride 106  96 - 112 (  mEq/L)    CO2 24  19 - 32 (mEq/L)    Glucose, Bld 97  70 - 99 (mg/dL)    BUN 12  6 - 23 (mg/dL)    Creatinine, Ser 4.09  0.50 - 1.35 (mg/dL)    Calcium 8.4  8.4 - 10.5 (mg/dL)    Total Protein 7.1  6.0 - 8.3 (g/dL)    Albumin 3.2 (*) 3.5 - 5.2 (g/dL)    AST 28  0 - 37 (U/L)    ALT 17  0 - 53 (U/L)    Alkaline Phosphatase 102  39 - 117 (U/L)    Total Bilirubin  0.5  0.3 - 1.2 (mg/dL)    GFR calc non Af Amer 78 (*) >90 (mL/min)    GFR calc Af Amer >90  >90 (mL/min)   COMPREHENSIVE METABOLIC PANEL     Status: Abnormal   Collection Time   12/09/11  5:03 AM      Component Value Range Comment   Sodium 134 (*) 135 - 145 (mEq/L)    Potassium 4.2  3.5 - 5.1 (mEq/L)    Chloride 102  96 - 112 (mEq/L)    CO2 23  19 - 32 (mEq/L)    Glucose, Bld 114 (*) 70 - 99 (mg/dL)    BUN 16  6 - 23 (mg/dL)    Creatinine, Ser 8.11  0.50 - 1.35 (mg/dL)    Calcium 8.0 (*) 8.4 - 10.5 (mg/dL)    Total Protein 6.7  6.0 - 8.3 (g/dL)    Albumin 3.0 (*) 3.5 - 5.2 (g/dL)    AST 25  0 - 37 (U/L)    ALT 16  0 - 53 (U/L)    Alkaline Phosphatase 91  39 - 117 (U/L)    Total Bilirubin 0.4  0.3 - 1.2 (mg/dL)    GFR calc non Af Amer 77 (*) >90 (mL/min)    GFR calc Af Amer 89 (*) >90 (mL/min)   CBC     Status: Abnormal   Collection Time   12/09/11  5:03 AM      Component Value Range Comment   WBC 5.2  4.0 - 10.5 (K/uL)    RBC 3.18 (*) 4.22 - 5.81 (MIL/uL)    Hemoglobin 9.5 (*) 13.0 - 17.0 (g/dL)    HCT 91.4 (*) 78.2 - 52.0 (%)    MCV 91.5  78.0 - 100.0 (fL)    MCH 29.9  26.0 - 34.0 (pg)    MCHC 32.6  30.0 - 36.0 (g/dL)    RDW 95.6  21.3 - 08.6 (%)    Platelets 198  150 - 400 (K/uL)   RETICULOCYTES     Status: Abnormal   Collection Time   12/09/11  5:03 AM      Component Value Range Comment   Retic Ct Pct 0.9  0.4 - 3.1 (%)    RBC. 3.18 (*) 4.22 - 5.81 (MIL/uL)    Retic Count, Manual 28.6  19.0 - 186.0 (K/uL)     Ct Head Wo Contrast  12/08/2011  *RADIOLOGY REPORT*  Clinical Data: Seizures.  CT HEAD WITHOUT CONTRAST  Technique:  Contiguous axial images were obtained from the base of the skull through the vertex without contrast.  Comparison: None.  Findings: Technically limited study due to motion artifact.  There is diffuse cerebral atrophy.  Ventricular dilatation likely representing central atrophy although normal pressure hydrocephalus is not excluded.  Low attenuation  changes throughout the deep white matter consistent with  small vessel ischemia.  No mass effect or midline shift.  No abnormal extra-axial fluid collections.  Wallace Cullens- white matter junctions are distinct.  Basal cisterns are not effaced.  No acute intracranial hemorrhage.  Vascular calcifications.  Diffuse opacification of the maxillary antra and ethmoid air cells and frontal sinuses with opacification of the left sphenoid sinuses.  Mastoid air cells are are not opacified. Vascular calcifications.  IMPRESSION: No acute intracranial abnormalities.  Diffuse atrophy and small vessel ischemic changes.  Opacification of the paranasal sinuses.  Original Report Authenticated By: Marlon Pel, M.D.   Mr Brain Wo Contrast  12/08/2011  *RADIOLOGY REPORT*  Clinical Data: Witnessed seizure.  History of seizures. Hypertension.  MRI HEAD WITHOUT CONTRAST  Technique:  Multiplanar, multiecho pulse sequences of the brain and surrounding structures were obtained according to standard protocol without intravenous contrast.  Comparison: 12/08/2011 head CT.  Findings: Motion degraded exam.  No acute infarct.  No intracranial hemorrhage.  Remote small thalamic and basal ganglia infarcts.  Moderate small vessel disease type changes.  Global atrophy.  Ventricular prominence may be related to central atrophy rather than hydrocephalus.  4.6 mm posterior right pituitary cystic-appearing structure possibly an incidentally detected pituitary cyst versus adenoma. No other intracranial mass lesion noted on this unenhanced motion degraded exam.  Left hippocampus minimally smaller than the right without definitive findings of mesial temporal sclerosis.  Paranasal sinus opacification with complete opacification of the frontal sinuses, majority of ethmoid sinus air cells, portion of the right sphenoid sinus and maxillary sinus bilaterally.  Major intracranial vascular structures are patent.  IMPRESSION: Motion degraded exam.  No acute infarct.   No intracranial hemorrhage.  Remote small thalamic and basal ganglia infarcts.  Moderate small vessel disease type changes.  Global atrophy.  Ventricular prominence may be related to central atrophy rather than hydrocephalus.  4.6 mm posterior right pituitary cystic-appearing structure possibly an incidentally detected pituitary cyst versus adenoma.  Left hippocampus minimally smaller than the right without definitive findings of mesial temporal sclerosis.  Paranasal sinus opacification with complete opacification of the frontal sinuses, majority of ethmoid sinus air cells, portion of the right sphenoid sinus and maxillary sinus bilaterally.  Original Report Authenticated By: Fuller Canada, M.D.   Disposition: Home  Diet: Heart healthy  Activity: Resume as tolerated.   Follow-up Appts: Discharge Orders    Future Orders Please Complete By Expires   Diet - low sodium heart healthy      Increase activity slowly      Discharge instructions      Comments:   Followup with KILPATRICK JR,GEORGE R, MD (PCP) in 1 week. Please have albumin, dilantin level, and CBC at the next clinic visit. Please consider colonoscopy as outpatient for anemia. Defer to Dr. Petra Kuba, if he wants to refer the patient to neurology.      TESTS THAT NEED FOLLOW-UP Anemia panel pending, PCP to followup on the results.  Time spent on discharge, talking to the patient, and coordinating care: 35 mins.   Signed: Cristal Ford, MD 12/09/2011, 10:28 AM

## 2012-01-13 ENCOUNTER — Observation Stay (HOSPITAL_COMMUNITY)
Admission: EM | Admit: 2012-01-13 | Discharge: 2012-01-15 | Disposition: A | Discharge status: Home / Self Care | Payer: Medicare Other | Attending: Internal Medicine | Admitting: Internal Medicine

## 2012-01-13 ENCOUNTER — Encounter (HOSPITAL_COMMUNITY): Payer: Self-pay | Admitting: *Deleted

## 2012-01-13 ENCOUNTER — Inpatient Hospital Stay (HOSPITAL_COMMUNITY): Payer: Medicare Other

## 2012-01-13 ENCOUNTER — Emergency Department (HOSPITAL_COMMUNITY): Payer: Medicare Other

## 2012-01-13 DIAGNOSIS — D649 Anemia, unspecified: Secondary | ICD-10-CM | POA: Diagnosis present

## 2012-01-13 DIAGNOSIS — R300 Dysuria: Secondary | ICD-10-CM | POA: Insufficient documentation

## 2012-01-13 DIAGNOSIS — Z9181 History of falling: Secondary | ICD-10-CM | POA: Insufficient documentation

## 2012-01-13 DIAGNOSIS — I10 Essential (primary) hypertension: Secondary | ICD-10-CM | POA: Diagnosis present

## 2012-01-13 DIAGNOSIS — Z79899 Other long term (current) drug therapy: Secondary | ICD-10-CM | POA: Insufficient documentation

## 2012-01-13 DIAGNOSIS — R569 Unspecified convulsions: Secondary | ICD-10-CM | POA: Diagnosis present

## 2012-01-13 DIAGNOSIS — G40802 Other epilepsy, not intractable, without status epilepticus: Secondary | ICD-10-CM | POA: Insufficient documentation

## 2012-01-13 DIAGNOSIS — R55 Syncope and collapse: Principal | ICD-10-CM

## 2012-01-13 LAB — COMPREHENSIVE METABOLIC PANEL
ALT: 9 U/L (ref 0–53)
Alkaline Phosphatase: 109 U/L (ref 39–117)
BUN: 21 mg/dL (ref 6–23)
Chloride: 103 mEq/L (ref 96–112)
GFR calc Af Amer: 76 mL/min — ABNORMAL LOW (ref >90)
Glucose, Bld: 101 mg/dL — ABNORMAL HIGH (ref 70–99)
Potassium: 4.3 mEq/L (ref 3.5–5.1)
Total Bilirubin: 0.2 mg/dL — ABNORMAL LOW (ref 0.3–1.2)

## 2012-01-13 LAB — RAPID URINE DRUG SCREEN, HOSP PERFORMED
Amphetamines: NOT DETECTED
Barbiturates: NOT DETECTED
Benzodiazepines: NOT DETECTED

## 2012-01-13 LAB — URINALYSIS, ROUTINE W REFLEX MICROSCOPIC
Bilirubin Urine: NEGATIVE
Nitrite: NEGATIVE
Protein, ur: NEGATIVE mg/dL
Specific Gravity, Urine: 1.023 (ref 1.005–1.030)
Urobilinogen, UA: 0.2 mg/dL (ref 0.0–1.0)

## 2012-01-13 LAB — CBC
MCHC: 32.2 g/dL (ref 30.0–36.0)
MCV: 90.6 fL (ref 78.0–100.0)
Platelets: 159 10*3/uL (ref 150–400)
RDW: 12.8 % (ref 11.5–15.5)
WBC: 5.5 10*3/uL (ref 4.0–10.5)

## 2012-01-13 LAB — URINE MICROSCOPIC-ADD ON

## 2012-01-13 LAB — ETHANOL: Alcohol, Ethyl (B): <11 mg/dL (ref 0–11)

## 2012-01-13 LAB — MAGNESIUM: Magnesium: 1.7 mg/dL (ref 1.5–2.5)

## 2012-01-13 LAB — PHOSPHORUS: Phosphorus: 3.1 mg/dL (ref 2.3–4.6)

## 2012-01-13 MED ORDER — ACETAMINOPHEN 650 MG RE SUPP: 650.0000 mg | Freq: Four times a day (QID) | RECTAL | Status: DC | PRN

## 2012-01-13 MED ORDER — SODIUM CHLORIDE 0.9 % IV SOLN
INTRAVENOUS | Status: AC
  Administered 2012-01-13 – 2012-01-14 (×2): via INTRAVENOUS

## 2012-01-13 MED ORDER — SODIUM CHLORIDE 0.9 % IJ SOLN
3.0000 mL | Freq: Two times a day (BID) | INTRAMUSCULAR | Status: DC
  Administered 2012-01-13 – 2012-01-15 (×3): 3 mL via INTRAVENOUS

## 2012-01-13 MED ORDER — SENNOSIDES-DOCUSATE SODIUM 8.6-50 MG PO TABS
1.0000 | ORAL_TABLET | Freq: Every evening | ORAL | Status: DC | PRN
  Administered 2012-01-14: 1 via ORAL
  Filled 2012-01-13 (×2): qty 1

## 2012-01-13 MED ORDER — ONDANSETRON HCL 4 MG PO TABS: 4.0000 mg | ORAL_TABLET | Freq: Four times a day (QID) | ORAL | Status: DC | PRN

## 2012-01-13 MED ORDER — BISACODYL 5 MG PO TBEC: 10.0000 mg | DELAYED_RELEASE_TABLET | Freq: Every day | ORAL | Status: DC | PRN

## 2012-01-13 MED ORDER — ATENOLOL 25 MG PO TABS
25.0000 mg | ORAL_TABLET | Freq: Every day | ORAL | Status: DC
  Administered 2012-01-13 – 2012-01-15 (×3): 25 mg via ORAL
  Filled 2012-01-13 (×3): qty 1

## 2012-01-13 MED ORDER — SODIUM CHLORIDE 0.9 % IV SOLN
1000.0000 mg | Freq: Once | INTRAVENOUS | Status: DC
  Filled 2012-01-13: qty 20

## 2012-01-13 MED ORDER — ONDANSETRON HCL 4 MG/2ML IJ SOLN: 4.0000 mg | Freq: Four times a day (QID) | INTRAMUSCULAR | Status: DC | PRN

## 2012-01-13 MED ORDER — ALUM & MAG HYDROXIDE-SIMETH 200-200-20 MG/5ML PO SUSP: 30.0000 mL | Freq: Four times a day (QID) | ORAL | Status: DC | PRN

## 2012-01-13 MED ORDER — POLYSACCHARIDE IRON COMPLEX 150 MG PO CAPS
150.0000 mg | ORAL_CAPSULE | Freq: Every day | ORAL | Status: DC
  Administered 2012-01-13 – 2012-01-15 (×3): 150 mg via ORAL
  Filled 2012-01-13 (×3): qty 1

## 2012-01-13 MED ORDER — TRAMADOL HCL 50 MG PO TABS: 25.0000 mg | ORAL_TABLET | Freq: Four times a day (QID) | ORAL | Status: DC | PRN

## 2012-01-13 MED ORDER — ACETAMINOPHEN 325 MG PO TABS: 650.0000 mg | ORAL_TABLET | Freq: Four times a day (QID) | ORAL | Status: DC | PRN

## 2012-01-13 NOTE — ED Notes (Signed)
Dr. Anitra Lauth re-notified of pts dilantin level. Informed of normal range. EDP to cancel order for dilantin. Ordered dose for dilantin not given.

## 2012-01-13 NOTE — ED Notes (Signed)
Pt also presents with abrasion to left upper lip

## 2012-01-13 NOTE — ED Notes (Signed)
Dr. Anitra Lauth notified of pts dilantin levels. Orders placed to administer dilantin IV.
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
## 2012-01-13 NOTE — ED Provider Notes (Signed)
History     CSN: [id number]  Arrival date & time 01/13/12  1235   First MD Initiated Contact with Patient 01/13/12 1255      Chief Complaint  Patient presents with  . Fall  . Seizures    (Consider location/radiation/quality/duration/timing/severity/associated sxs/prior treatment) Patient is a 78 y.o. male presenting with fall and seizures. The history is provided by the patient.  Fall Pertinent negatives include no fever, no numbness, no abdominal pain and no vomiting.  Seizures  Pertinent negatives include patient does not experience confusion, no visual disturbance, no sore throat, no chest pain, no cough, no vomiting and no diarrhea.  pt w hx seizures presents post fall yesterday and again today. Yesterday was walking home from store, fell on concrete. Pt states 'passed out'. Denies remembering what cause fall. Does not recall tripping or stumbling. Today was at home and fell again hitting head. w todays episode also states 'I just blacked out'. Pt/family deny any seizure activity. States last seizure was approximately 1 month ago prompting hospital stay. Denies prior episodes syncope or passing out. Denies any current or recent chest pain or discomfort. No palpitations or sense of irregular heart beat. Denies hx cad or dysrhythmia. Family states at baseline walks slowly and sometimes uses cane. States mental status appears c/w baseline. Pt c/o dull pain localized to contusion face/forehead. Denies severe headache. No neck pain. No back pain. No cough or uri c/o. No abd pain. No nvd. States has had normal appetite and normal po intake. Denies rectal bleeding or melena. No gu c/o. No numbness/weakness. No change in speech or vision. Denies recent change in meds or new meds.      Past Medical History  Diagnosis Date  . Hypertension   . Seizures   . Pancreatitis     Past Surgical History  Procedure Date  . Hernia repair     Family History  Problem Relation Age of Onset  .  Hypertension Other   . Diabetes Other   . Asthma Other     History  Substance Use Topics  . Smoking status: Current Some Day Smoker    Types: Cigars  . Smokeless tobacco: Never Used  . Alcohol Use: No      Review of Systems  Constitutional: Negative for fever and chills.  HENT: Negative for sore throat, neck pain and neck stiffness.   Eyes: Negative for pain and visual disturbance.  Respiratory: Negative for cough and shortness of breath.   Cardiovascular: Negative for chest pain and palpitations.  Gastrointestinal: Negative for vomiting, abdominal pain, diarrhea and blood in stool.  Genitourinary: Negative for dysuria and flank pain.  Musculoskeletal: Negative for back pain.  Skin: Negative for rash.  Neurological: Positive for seizures. Negative for weakness and numbness.  Hematological: Does not bruise/bleed easily.  Psychiatric/Behavioral: Negative for confusion.    Allergies  Review of patient's allergies indicates no known allergies.  Home Medications   Current Outpatient Rx  Name Route Sig Dispense Refill  . ATENOLOL 25 MG PO TABS Oral Take 25 mg by mouth daily.    Marland Kitchen POLYSACCHARIDE IRON COMPLEX 150 MG PO CAPS Oral Take 150 mg by mouth daily.    Marland Kitchen PHENYTOIN SODIUM EXTENDED 200 MG PO CAPS Oral Take 200-300 mg by mouth daily. Takes 3 capsules on sundays, and thursdays for 300 mg dosage. Patient takes 2 capsules on mondays, tuesdays, wednesdays, fridays, and saturdays for 200 mg dosage      BP 124/62  Pulse 69  Temp  98.3 F (36.8 C) (Oral)  Resp 18  Wt 120 lb (54.432 kg)  SpO2 98%  Physical Exam  Nursing note and vitals reviewed. Constitutional: He is oriented to person, place, and time. He appears well-developed and well-nourished. No distress.  HENT:  Nose: Nose normal.  Mouth/Throat: Oropharynx is clear and moist.       Contusion to forehead. Healing wound/scab to upper lip. Facial bones/orbits intact. No oral/tongue lac.   Eyes: Conjunctivae and EOM are  normal. Pupils are equal, round, and reactive to light.  Neck: Normal range of motion. Neck supple. No tracheal deviation present.       No bruit  Cardiovascular: Normal rate, regular rhythm, normal heart sounds and intact distal pulses.   Pulmonary/Chest: Effort normal and breath sounds normal. No accessory muscle usage. No respiratory distress. He exhibits no tenderness.  Abdominal: Soft. He exhibits no distension and no mass. There is no tenderness. There is no rebound and no guarding.  Genitourinary:       No cva tenderness. Normal ext gen, no incontinence.   Musculoskeletal: Normal range of motion. He exhibits no edema and no tenderness.       Good rom bil extremities without pain or focal bony tenderness  Neurological: He is alert and oriented to person, place, and time. No cranial nerve deficit.       Motor intact bil 5/5. Ambulates w slow, sl unsteady gait.   Skin: Skin is warm and dry.  Psychiatric: He has a normal mood and affect.    ED Course  Procedures (including critical care time)   Labs Reviewed  COMPREHENSIVE METABOLIC PANEL  CBC  URINALYSIS, ROUTINE W REFLEX MICROSCOPIC  PHENYTOIN LEVEL, TOTAL    Results for orders placed during the hospital encounter of 01/13/12  COMPREHENSIVE METABOLIC PANEL      Component Value Range   Sodium 135  135 - 145 mEq/L   Potassium 4.3  3.5 - 5.1 mEq/L   Chloride 103  96 - 112 mEq/L   CO2 24  19 - 32 mEq/L   Glucose, Bld 101 (*) 70 - 99 mg/dL   BUN 21  6 - 23 mg/dL   Creatinine, Ser 4.54  0.50 - 1.35 mg/dL   Calcium 8.5  8.4 - 09.8 mg/dL   Total Protein 7.4  6.0 - 8.3 g/dL   Albumin 3.3 (*) 3.5 - 5.2 g/dL   AST 18  0 - 37 U/L   ALT 9  0 - 53 U/L   Alkaline Phosphatase 109  39 - 117 U/L   Total Bilirubin 0.2 (*) 0.3 - 1.2 mg/dL   GFR calc non Af Amer 65 (*) >90 mL/min   GFR calc Af Amer 76 (*) >90 mL/min  CBC      Component Value Range   WBC 5.5  4.0 - 10.5 K/uL   RBC 3.29 (*) 4.22 - 5.81 MIL/uL   Hemoglobin 9.6 (*) 13.0  - 17.0 g/dL   HCT 11.9 (*) 14.7 - 82.9 %   MCV 90.6  78.0 - 100.0 fL   MCH 29.2  26.0 - 34.0 pg   MCHC 32.2  30.0 - 36.0 g/dL   RDW 56.2  13.0 - 86.5 %   Platelets 159  150 - 400 K/uL  URINALYSIS, ROUTINE W REFLEX MICROSCOPIC      Component Value Range   Color, Urine YELLOW  YELLOW   APPearance CLEAR  CLEAR   Specific Gravity, Urine 1.023  1.005 - 1.030  pH 5.5  5.0 - 8.0   Glucose, UA NEGATIVE  NEGATIVE mg/dL   Hgb urine dipstick NEGATIVE  NEGATIVE   Bilirubin Urine NEGATIVE  NEGATIVE   Ketones, ur NEGATIVE  NEGATIVE mg/dL   Protein, ur NEGATIVE  NEGATIVE mg/dL   Urobilinogen, UA 0.2  0.0 - 1.0 mg/dL   Nitrite NEGATIVE  NEGATIVE   Leukocytes, UA TRACE (*) NEGATIVE  ETHANOL      Component Value Range   Alcohol, Ethyl (B) <11  0 - 11 mg/dL  URINE RAPID DRUG SCREEN (HOSP PERFORMED)      Component Value Range   Opiates NONE DETECTED  NONE DETECTED   Cocaine NONE DETECTED  NONE DETECTED   Benzodiazepines NONE DETECTED  NONE DETECTED   Amphetamines NONE DETECTED  NONE DETECTED   Tetrahydrocannabinol NONE DETECTED  NONE DETECTED   Barbiturates NONE DETECTED  NONE DETECTED  URINE MICROSCOPIC-ADD ON      Component Value Range   Squamous Epithelial / LPF RARE  RARE   WBC, UA 0-2  <3 WBC/hpf   Ct Head Wo Contrast  01/13/2012  *RADIOLOGY REPORT*  Clinical Data: Larey Seat yesterday hitting head with loss of consciousness, bruising  CT HEAD WITHOUT CONTRAST  Technique:  Contiguous axial images were obtained from the base of the skull through the vertex without contrast.  Comparison: CT brain scan of 12/08/2011  Findings: The ventricular system remains prominent as are the cortical sulci diffusely consistent with diffuse atrophy.  The septum remains midline in position.  Moderate small vessel ischemic change is again noted throughout the periventricular white matter and small lacunar infarcts are present bilaterally.  No hemorrhage, mass effect or acute infarction is seen. There appears be chronic  opacification of the maxillary sinuses consistent with chronic sinusitis involving the ethmoid air cells and frontal sinuses as well.  No acute calvarial abnormality is seen.  There does appear to be a right frontal scalp hematoma present.  IMPRESSION:  1.  Atrophy and moderate small vessel ischemic change.  No acute intracranial abnormality. 2.  Chronic sinusitis. 3.  Right frontal scalp hematoma.  Original Report Authenticated By: Juline Patch, M.D.      MDM  Iv ns. Seizure precautions. Ct. Labs.    Date: 01/13/2012  Rate: 68  Rhythm: normal sinus rhythm  QRS Axis: normal  Intervals: normal  ST/T Wave abnormalities: normal  Conduction Disutrbances:none  Narrative Interpretation:   Old EKG Reviewed: unchanged    Pt and family state do not feel had seizure, 'passed out/blacked out'.  Given concern recurrent syncopal events, will admit for obs. Discussed w triad, states to do temp orders for tele bed, team 4.   Dilantin level pending - discussed w [facility] that level remains pending.  Recheck awake and alert, no new c/o or change from prior exam.     Suzi Roots, MD 01/13/12 [phone]

## 2012-01-13 NOTE — H&P (Signed)
Carlos Shields MRN: [id number] DOB/AGE: [birth year] 78 y.o. Primary Care Physician:KILPATRICK Minda Meo, MD Admit date: 01/13/2012 Chief Complaint: Syncope HPI:  Carlos Shields is a 78 year old African American gentleman history of seizure disorder on Dilantin, history of hypertension, prior history of pancreatitis who presents to the ED with 2 syncopal episodes. Patient stated that one day prior to admission he was walking home from the store when he suddenly fell down on the concrete and passed out. Patient states that he was passed out for approximately 5 minutes subselectively woke up and made his way home. Patient denies any dizziness, no premonitory symptoms. Patient states that on the day of admission he was at home and his liberal when he fell again hitting his head. Patient stated that he subsequently blacked out for approximately also 5 minutes. Patient stated that his sister-in-law found him and subsequently he was brought to the ED. Patient denies any seizure activity. Patient denies any fever, no chest pain, shortness of breath, no cough, no palpitations, no abdominal pain, no constipation, no dizziness, no weakness, no nausea, no vomiting, no diarrhea, no constipation, no abdominal pain, no back pain, no neck pain. Patient denies any melena or hematochezia. Patient denies any numbness and no visual changes. Patient denies any change in his medications. Patient states he's been compliant with all his medications. Patient does endorse some dysuria. Patient was seen in the ED CT of the head which was done showed right frontal scalp hematoma and chronic sinusitis with no acute intracranial abnormality. Urinalysis which was done was nitrite negative with trace leukocytes and 0-2 WBCs. Comprehensive metabolic profile which was done had an albumin of 3.3 otherwise was within normal limits. CBC which was done had a hemoglobin of 9.6 which seems to be patient's baseline otherwise was within normal limits. Dilantin  level was checked which came back elevated at 30.5. Will call to admit the patient for further evaluation and management.  Past Medical History  Diagnosis Date  . Hypertension   . Seizures   . Pancreatitis     Past Surgical History  Procedure Date  . Hernia repair     Prior to Admission medications   Medication Sig Start Date End Date Taking? Authorizing Provider  atenolol (TENORMIN) 25 MG tablet Take 25 mg by mouth daily.   Yes Historical Provider, MD  iron polysaccharides (NIFEREX) 150 MG capsule Take 150 mg by mouth daily. 12/09/11 12/08/12 Yes Srikar Cherlynn Kaiser, MD  phenytoin (DILANTIN) 200 MG ER capsule Take 200-300 mg by mouth daily. Takes 3 capsules on sundays, and thursdays for 300 mg dosage. Patient takes 2 capsules on mondays, tuesdays, wednesdays, fridays, and saturdays for 200 mg dosage 12/09/11 12/08/12 Yes Srikar Cherlynn Kaiser, MD    Allergies: No Known Allergies  Family History  Problem Relation Age of Onset  . Hypertension Other   . Diabetes Other   . Asthma Other     Social History:  reports that he has been smoking Cigars.  He has never used smokeless tobacco. He reports that he does not drink alcohol or use illicit drugs.  ROS: All systems reviewed with the patient and was positive as per HPI otherwise all other systems are negative.  PHYSICAL EXAM: Blood pressure 130/70, pulse 64, temperature 98.6 F (37 C), temperature source Oral, resp. rate 16, height 5\' 2"  (1.575 m), weight 53.933 kg (118 lb 14.4 oz), SpO2 97.00%. General: Well-developed well-nourished with a right frontal hematoma and swelling to the upper lip. No acute cardiopulmonary distress. Speaking  in full sentences.  HEENT: Normocephalic. Contusion/hematoma to the right for head. Swelling of the right upper lip with a wound/scab. Pupils equal round and reactive to light and accommodation. Extraocular movements intact. Oropharynx is clear, no lesions, no exudates. No tongue laceration. Neck is supple with no  lymphadenopathy. No bruits, no goiter. Heart: Regular rate and rhythm, without murmurs, rubs, gallops. Lungs: Clear to auscultation bilaterally. Abdomen: Soft, nontender, nondistended, positive bowel sounds. Extremities: No clubbing cyanosis or edema with positive pedal pulses. Neuro: Alert and monitored x3. Cranial nerves II through XII are grossly intact. 5/ 5 bilateral upper extremity strength. 5/ 5 bilateral lower extremity strength. Visual fields are intact. Sensation is intact. Unable to elicit reflexes symmetrically and diffusely. Gait not tested secondary to safety. Grossly intact, nonfocal.    EKG: Normal sinus rhythm.  No results found for this or any previous visit (from the past 240 hour(s)).   Lab results:  Basename 01/13/12 1315  NA 135  K 4.3  CL 103  CO2 24  GLUCOSE 101*  BUN 21  CREATININE 1.06  CALCIUM 8.5  MG --  PHOS --    Basename 01/13/12 1315  AST 18  ALT 9  ALKPHOS 109  BILITOT 0.2*  PROT 7.4  ALBUMIN 3.3*   No results found for this basename: LIPASE:2,AMYLASE:2 in the last 72 hours  Basename 01/13/12 1315  WBC 5.5  NEUTROABS --  HGB 9.6*  HCT 29.8*  MCV 90.6  PLT 159   No results found for this basename: CKTOTAL:3,CKMB:3,CKMBINDEX:3,TROPONINI:3 in the last 72 hours No components found with this basename: POCBNP:3 No results found for this basename: DDIMER in the last 72 hours No results found for this basename: HGBA1C:2 in the last 72 hours No results found for this basename: CHOL:2,HDL:2,LDLCALC:2,TRIG:2,CHOLHDL:2,LDLDIRECT:2 in the last 72 hours No results found for this basename: TSH,T4TOTAL,FREET3,T3FREE,THYROIDAB in the last 72 hours No results found for this basename: VITAMINB12:2,FOLATE:2,FERRITIN:2,TIBC:2,IRON:2,RETICCTPCT:2 in the last 72 hours Imaging results:  Ct Head Wo Contrast  01/13/2012  *RADIOLOGY REPORT*  Clinical Data: Larey Seat yesterday hitting head with loss of consciousness, bruising  CT HEAD WITHOUT CONTRAST  Technique:   Contiguous axial images were obtained from the base of the skull through the vertex without contrast.  Comparison: CT brain scan of 12/08/2011  Findings: The ventricular system remains prominent as are the cortical sulci diffusely consistent with diffuse atrophy.  The septum remains midline in position.  Moderate small vessel ischemic change is again noted throughout the periventricular white matter and small lacunar infarcts are present bilaterally.  No hemorrhage, mass effect or acute infarction is seen. There appears be chronic opacification of the maxillary sinuses consistent with chronic sinusitis involving the ethmoid air cells and frontal sinuses as well.  No acute calvarial abnormality is seen.  There does appear to be a right frontal scalp hematoma present.  IMPRESSION:  1.  Atrophy and moderate small vessel ischemic change.  No acute intracranial abnormality. 2.  Chronic sinusitis. 3.  Right frontal scalp hematoma.  Original Report Authenticated By: Juline Patch, M.D.   Impression/Plan:  Principal Problem:  *Syncope Active Problems:  Seizures  HTN (hypertension)  Anemia   #1 syncope Questionable etiology. Orthostatics were checked and patient is not orthostatic. Differential includes cardiogenic versus neurological. Will admit the patient to telemetry and monitor. We'll cycle cardiac enzymes every 8 hours x3. Will check a 2-D echo to rule out any valvular abnormalities. Will check carotid Dopplers. Will check MRI of the head. Will check a EEG.  Check a magnesium level. Will hydrate with IV fluids. Fall precautions. Seizure precautions. Neuro checks every 4 hours x24-48 hours. Will monitor and follow.  #2 history of seizures Patient denies any recent seizure activity. Dilantin levels are elevated at 30.5. We'll place on seizure precautions and fall precautions. We'll have pharmacy dose patient's Dilantin.  #3 hypertension Continue home regimen of atenolol.  #4 anemia H&H is stable. No  overt GI bleed. Will continue patient's home regimen of Niferex.  #5 prophylaxis SCDs for DVT prophylaxis.   THOMPSON,DANIEL [phone] 01/13/2012, 4:41 PM

## 2012-01-13 NOTE — Progress Notes (Signed)
MEDICATION RELATED CONSULT NOTE - INITIAL   Pharmacy Consult for Phenytoin  Indication: Seizure disorder  No Known Allergies  Patient Measurements: Height: 5\' 2"  (157.5 cm) Weight: 118 lb 14.4 oz (53.933 kg) IBW/kg (Calculated) : 54.6   Vital Signs: Temp: 98.6 F (37 C) (07/04 1613) Temp src: Oral (07/04 1613) BP: 130/70 mmHg (07/04 1613) Pulse Rate: 64  (07/04 1613) Intake/Output from previous day:   Intake/Output from this shift: Total I/O In: -  Out: 175 [Urine:175]  Labs:  [facility] 01/13/12 1315  WBC 5.5  HGB 9.6*  HCT 29.8*  PLT 159  APTT --  CREATININE 1.06  LABCREA --  CREATININE 1.06  CREAT24HRUR --  MG --  PHOS --  ALBUMIN 3.3*  PROT 7.4  ALBUMIN 3.3*  AST 18  ALT 9  ALKPHOS 109  BILITOT 0.2*  BILIDIR --  IBILI --   Estimated Creatinine Clearance: 43.8 ml/min (by C-G formula based on Cr of 1.06).   Microbiology: No results found for this or any previous visit (from the past 720 hour(s)).  Medical History: Past Medical History  Diagnosis Date  . Hypertension   . Seizures   . Pancreatitis     Medications:  Prescriptions prior to admission  Medication Sig Dispense Refill  . atenolol (TENORMIN) 25 MG tablet Take 25 mg by mouth daily.      . iron polysaccharides (NIFEREX) 150 MG capsule Take 150 mg by mouth daily.      . phenytoin (DILANTIN) 200 MG ER capsule Take 200-300 mg by mouth daily. Takes 3 capsules on sundays, and thursdays for 300 mg dosage. Patient takes 2 capsules on mondays, tuesdays, wednesdays, fridays, and saturdays for 200 mg dosage        Assessment:  78yo M admitted after 2 syncopal episodes, falling and hitting his head. Denies seizures.   On chronic phenytoin for seizure disorder. Last dose reported as 7/3, unable to confirm time with patient.  Level is above target range. Corrected for low albumin the . phenytoin level is estimated to be ~ 52mcg/ml.   Signs of phenytoin toxicity are: horizontal nystagmus,  unsteady gait, slurred speech, lethargy, and confusion.   Goal of Therapy:  Prevention of seizures. Phenytoin level 10-83mcg/ml.  Plan:   Hold phenytoin for now.  Recheck a phenytoin level with am labs.  Charolotte Eke, PharmD, pager [phone]. 01/13/2012,5:19 PM.

## 2012-01-13 NOTE — ED Notes (Signed)
Son states "he fell yesterday and hit his head on the concrete and today he fell & hit his head on the floor, mama said it wasn't no seizure"; pt admits LOC; pt presents with hematoma to right forehead, bruising to left eye.

## 2012-01-14 ENCOUNTER — Inpatient Hospital Stay (HOSPITAL_COMMUNITY): Payer: Medicare Other

## 2012-01-14 ENCOUNTER — Inpatient Hospital Stay (HOSPITAL_COMMUNITY)
Admit: 2012-01-14 | Discharge: 2012-01-14 | Disposition: A | Discharge status: Home / Self Care | Payer: Medicare Other | Attending: Internal Medicine | Admitting: Internal Medicine

## 2012-01-14 DIAGNOSIS — R55 Syncope and collapse: Secondary | ICD-10-CM

## 2012-01-14 DIAGNOSIS — I517 Cardiomegaly: Secondary | ICD-10-CM

## 2012-01-14 DIAGNOSIS — R569 Unspecified convulsions: Secondary | ICD-10-CM

## 2012-01-14 LAB — BASIC METABOLIC PANEL
Calcium: 8.6 mg/dL (ref 8.4–10.5)
GFR calc Af Amer: >90 mL/min (ref >90)
GFR calc non Af Amer: 79 mL/min — ABNORMAL LOW (ref >90)
Sodium: 135 mEq/L (ref 135–145)

## 2012-01-14 LAB — CARDIAC PANEL(CRET KIN+CKTOT+MB+TROPI)
CK, MB: 2 ng/mL (ref 0.3–4.0)
CK, MB: 1.4 ng/mL (ref 0.3–4.0)
Total CK: 94 U/L (ref 7–232)
Troponin I: <0.3 ng/mL (ref <0.30)
Troponin I: <0.3 ng/mL (ref <0.30)

## 2012-01-14 LAB — CBC
Platelets: 184 10*3/uL (ref 150–400)
RBC: 3.77 MIL/uL — ABNORMAL LOW (ref 4.22–5.81)
WBC: 6.3 10*3/uL (ref 4.0–10.5)

## 2012-01-14 LAB — PHENYTOIN LEVEL, TOTAL: Phenytoin Lvl: 37.6 ug/mL (ref 10.0–20.0)

## 2012-01-14 MED ORDER — MAGNESIUM SULFATE 40 MG/ML IJ SOLN
2.0000 g | Freq: Once | INTRAMUSCULAR | Status: DC
  Filled 2012-01-14: qty 50

## 2012-01-14 MED ORDER — MAGNESIUM SULFATE 50 % IJ SOLN
2.0000 g | Freq: Once | INTRAVENOUS | Status: AC
  Administered 2012-01-14: 2 g via INTRAVENOUS
  Filled 2012-01-14: qty 4

## 2012-01-14 NOTE — Evaluation (Signed)
Physical Therapy Evaluation Patient Details Name: Carlos Shields MRN: [id number] DOB: Aug 09, 1930 Today's Date: 01/14/2012 Time: 4098-1191 PT Time Calculation (min): 10 min  PT Assessment / Plan / Recommendation Clinical Impression  Pt presents with syncope and two episodes of passing out (one on Wed and one yesterday).  Fall yesterday caused pt to sustain right scalp hematoma.  Presents with decreased overal strength and balance.  Noted difficulty processing instructions given from PT/OT, requiring redirecting and multiple verbal cues.      PT Assessment  Patient needs continued PT services    Follow Up Recommendations  Home health PT    Barriers to Discharge None      Equipment Recommendations  Rolling walker with 5" wheels    Recommendations for Other Services     Frequency Min 3X/week    Precautions / Restrictions Precautions Precautions: Fall Restrictions Weight Bearing Restrictions: No   Pertinent Vitals/Pain No pain      Mobility  Bed Mobility Bed Mobility: Sit to Supine Sit to Supine: 5: Supervision;HOB flat Details for Bed Mobility Assistance: Supervision for safety, however noted difficulty following instruction for getting into bed.  Pt sitting EOB, got up and attempted to get into bed forwards.  Re-directed pt several times with pt finally getting into bed appropriately.  Transfers Transfers: Sit to Stand;Stand to Sit Sit to Stand: 4: Min assist;With upper extremity assist;With armrests;From chair/3-in-1 Stand to Sit: 4: Min assist;With upper extremity assist;To bed Details for Transfer Assistance: Min assist for steadying due to noted imbalance with cues for hand placement and technique when sitting/standing.   Ambulation/Gait Ambulation/Gait Assistance: 4: Min guard Ambulation Distance (Feet): 20 Feet Assistive device: Rolling walker Ambulation/Gait Assistance Details: Cues for sequencing/technique with RW.  Noted short shuffled gait pattern and unsteadiness.    Gait Pattern: Step-through pattern;Decreased stride length;Shuffle;Trunk flexed Gait velocity: decreased Stairs: No Wheelchair Mobility Wheelchair Mobility: No    Exercises     PT Diagnosis: Difficulty walking;Abnormality of gait;Generalized weakness  PT Problem List: Decreased strength;Decreased activity tolerance;Decreased balance;Decreased mobility;Decreased knowledge of use of DME;Decreased safety awareness PT Treatment Interventions: DME instruction;Gait training;Stair training;Patient/family education;Functional mobility training;Therapeutic activities;Therapeutic exercise;Balance training   PT Goals Acute Rehab PT Goals PT Goal Formulation: With patient Time For Goal Achievement: 01/28/12 Potential to Achieve Goals: Good Pt will go Sit to Stand: with modified independence PT Goal: Sit to Stand - Progress: Goal set today Pt will go Stand to Sit: with modified independence PT Goal: Stand to Sit - Progress: Goal set today Pt will Ambulate: 51 - 150 feet;with modified independence;with least restrictive assistive device PT Goal: Ambulate - Progress: Goal set today Pt will Go Up / Down Stairs: 1-2 stairs;with supervision;with least restrictive assistive device PT Goal: Up/Down Stairs - Progress: Goal set today Pt will Perform Home Exercise Program: with supervision, verbal cues required/provided PT Goal: Perform Home Exercise Program - Progress: Goal set today  Visit Information  Last PT Received On: 01/14/12 Assistance Needed: +1 PT/OT Co-Evaluation/Treatment: Yes    Subjective Data  Subjective: I feel a little weaker than before.  Patient Stated Goal: n/a   Prior Functioning  Home Living Lives With:  (grandson) Available Help at Discharge: Available 24 hours/day Type of Home: House Home Access: Stairs to enter [employer] of Steps: 1 Entrance Stairs-Rails: Right;Left Home Layout: One level Bathroom Shower/Tub: Engineer, manufacturing systems:  Standard Home Adaptive Equipment: Straight cane Prior Function Level of Independence: Independent with assistive device(s) Able to Take Stairs?: Yes Communication Communication: No difficulties Dominant  Hand: Right    Cognition  Overall Cognitive Status: Appears within functional limits for tasks assessed/performed Arousal/Alertness: Awake/alert Orientation Level: Appears intact for tasks assessed Behavior During Session: [facility] for tasks performed    Extremity/Trunk Assessment Right Lower Extremity Assessment RLE ROM/Strength/Tone: Deficits RLE ROM/Strength/Tone Deficits: WFL, however noted some difficulty processing instructions for MMT RLE Sensation: WFL - Light Touch RLE Coordination: Deficits RLE Coordination Deficits: Heel to shin test performed with some lack of smooth movement, mostly WFL Left Lower Extremity Assessment LLE ROM/Strength/Tone: Deficits LLE ROM/Strength/Tone Deficits: WFL however noted difficulty processing instructions for MMT LLE Sensation: WFL - Light Touch LLE Coordination: Deficits LLE Coordination Deficits: Heel to shin performed with some lack of smooth movement, however mostly WFL Trunk Assessment Trunk Assessment: Kyphotic   Balance    End of Session PT - End of Session Activity Tolerance: Patient tolerated treatment well Patient left: in bed;with call bell/phone within reach;with family/visitor present Nurse Communication: Mobility status  GP     Page, Meribeth Mattes 01/14/2012, 9:53 AM

## 2012-01-14 NOTE — Progress Notes (Signed)
Talked to the patient about DCP with daughter present; Patient requested [facility] for HHPT/RN and also needs a rolling walker; B Lobbyist, BSN, [place]

## 2012-01-14 NOTE — Evaluation (Signed)
Occupational Therapy Evaluation Patient Details Name: Carlos Shields MRN: [id number] DOB: [birth year] Today's Date: 01/14/2012 Time: 2130-8657 OT Time Calculation (min): 11 min  OT Assessment / Plan / Recommendation Clinical Impression  Pt is a 78 yo male who presents with a syncopal episode at home resulting in a fall. Skilled OT indicated to maximize I w/BADLs to supervision level in prep for safe d/c home with HHOT.    OT Assessment  Patient needs continued OT Services    Follow Up Recommendations  Home health OT;Supervision/Assistance - 24 hour    Barriers to Discharge      Equipment Recommendations  Rolling walker with 5" wheels    Recommendations for Other Services    Frequency  Min 2X/week    Precautions / Restrictions Precautions Precautions: Fall Restrictions Weight Bearing Restrictions: No   Pertinent Vitals/Pain     ADL  Grooming: Simulated;Set up Where Assessed - Grooming: Unsupported sitting Upper Body Bathing: Simulated;Set up Where Assessed - Upper Body Bathing: Unsupported sitting Lower Body Bathing: Simulated;Minimal assistance Where Assessed - Lower Body Bathing: Supported sit to stand Upper Body Dressing: Simulated;Set up Where Assessed - Upper Body Dressing: Unsupported sitting Lower Body Dressing: Simulated;Minimal assistance Where Assessed - Lower Body Dressing: Supported sit to stand Toilet Transfer: Simulated;Minimal assistance Toilet Transfer Method: Sit to Barista: Other (comment) (assisted pt back to bed) Toileting - Clothing Manipulation and Hygiene: Simulated;Minimal assistance Where Assessed - Engineer, mining and Hygiene: Standing Equipment Used: Rolling walker ADL Comments: Eval limited 2* pt about to have an EKG. Pt slightly unsteady and fatigues quickly.    OT Diagnosis: Generalized weakness  OT Problem List: Decreased cognition;Decreased safety awareness;Decreased activity tolerance;Impaired balance  (sitting and/or standing);Decreased knowledge of use of DME or AE OT Treatment Interventions: Self-care/ADL training;Therapeutic activities;DME and/or AE instruction;Patient/family education   OT Goals Acute Rehab OT Goals OT Goal Formulation: With patient/family Time For Goal Achievement: 01/28/12 Potential to Achieve Goals: Good ADL Goals Pt Will Perform Grooming: with supervision;Standing at sink ADL Goal: Grooming - Progress: Goal set today Pt Will Perform Lower Body Bathing: with supervision;Sit to stand from chair;Sit to stand from bed ADL Goal: Lower Body Bathing - Progress: Goal set today Pt Will Perform Lower Body Dressing: with supervision;Sit to stand from chair;Sit to stand from bed ADL Goal: Lower Body Dressing - Progress: Goal set today Pt Will Transfer to Toilet: with supervision;Regular height toilet;Ambulation ADL Goal: Toilet Transfer - Progress: Goal set today Pt Will Perform Toileting - Hygiene: with supervision;Sit to stand from 3-in-1/toilet ADL Goal: Toileting - Hygiene - Progress: Goal set today  Visit Information  Last OT Received On: 01/14/12 Assistance Needed: +1 PT/OT Co-Evaluation/Treatment: Yes    Subjective Data  Subjective: I don't cook or clean anymore. Patient Stated Goal: Not asked.   Prior Functioning  Home Living Lives With:  (grandson) Available Help at Discharge: Available 24 hours/day Type of Home: House Home Access: Stairs to enter [employer] of Steps: 1 Entrance Stairs-Rails: Right;Left Home Layout: One level Bathroom Shower/Tub: Engineer, manufacturing systems: Standard Home Adaptive Equipment: Straight cane Prior Function Level of Independence: Independent with assistive device(s) Able to Take Stairs?: Yes Driving: No Communication Communication: No difficulties Dominant Hand: Right    Cognition  Overall Cognitive Status: History of cognitive impairments - at baseline Arousal/Alertness: Awake/alert Orientation  Level: Appears intact for tasks assessed Behavior During Session: [facility] for tasks performed Cognition - Other Comments: Pt is slow to process info, multistep commands.    Extremity/Trunk  Assessment Right Upper Extremity Assessment RUE ROM/Strength/Tone: [facility] for tasks assessed Left Upper Extremity Assessment LUE ROM/Strength/Tone: WFL for tasks assessed Right Lower Extremity Assessment RLE ROM/Strength/Tone: Deficits RLE ROM/Strength/Tone Deficits: WFL, however noted some difficulty processing instructions for MMT RLE Sensation: WFL - Light Touch RLE Coordination: Deficits RLE Coordination Deficits: Heel to shin test performed with some lack of smooth movement, mostly WFL Left Lower Extremity Assessment LLE ROM/Strength/Tone: Deficits LLE ROM/Strength/Tone Deficits: WFL however noted difficulty processing instructions for MMT LLE Sensation: WFL - Light Touch LLE Coordination: Deficits LLE Coordination Deficits: Heel to shin performed with some lack of smooth movement, however mostly WFL Trunk Assessment Trunk Assessment: Kyphotic   Mobility Bed Mobility Bed Mobility: Sit to Supine Sit to Supine: 5: Supervision;HOB flat Details for Bed Mobility Assistance: Supervision for safety, however noted difficulty following instruction for getting into bed.  Pt sitting EOB, got up and attempted to get into bed forwards.  Re-directed pt several times with pt finally getting into bed appropriately.  Transfers Sit to Stand: 4: Min assist;With upper extremity assist;With armrests;From chair/3-in-1 Stand to Sit: 4: Min assist;With upper extremity assist;To bed Details for Transfer Assistance: Min assist for steadying due to noted imbalance with cues for hand placement and technique when sitting/standing.     Exercise    Balance    End of Session OT - End of Session Activity Tolerance: Patient tolerated treatment well Patient left: in bed;with call bell/phone within reach;with family/visitor present    GO     Carlos Shields A OTR/L [phone] 01/14/2012, 10:02 AM

## 2012-01-14 NOTE — Progress Notes (Signed)
*  PRELIMINARY RESULTS* Vascular Ultrasound Carotid Duplex (Doppler) has been completed.   No evidence of internal carotid artery stenosis bilaterally. Bilateral antegrade vertebral artery flow.  01/14/2012 11:17 AM Gertie Fey, RDMS, RDCS

## 2012-01-14 NOTE — Progress Notes (Signed)
Subjective: Patient with no complaints. No further syncopal episodes.  Objective: Vital signs in last 24 hours: Filed Vitals:   01/14/12 0500 01/14/12 1117 01/14/12 1333 01/14/12 1800  BP: 130/66 134/69 119/62 116/68  Pulse: 67 62 68 78  Temp: 99.3 F (37.4 C) 98.3 F (36.8 C) 98.1 F (36.7 C) 99 F (37.2 C)  TempSrc: Oral Oral Oral Oral  Resp: 18 16 16 18   Height:      Weight: 53.388 kg (117 lb 11.2 oz)     SpO2: 97% 100% 100% 98%    Intake/Output Summary (Last 24 hours) at 01/14/12 1917 Last data filed at 01/14/12 1900  Gross per 24 hour  Intake   3300 ml  Output   3625 ml  Net   -325 ml    Weight change:   General: Alert, awake, oriented x3, in no acute distress. HEENT: No bruits, no goiter. Heart: Regular rate and rhythm, without murmurs, rubs, gallops. Lungs: Clear to auscultation bilaterally. Abdomen: Soft, nontender, nondistended, positive bowel sounds. Extremities: No clubbing cyanosis or edema with positive pedal pulses. Neuro: Grossly intact, nonfocal.    Lab Results:  Basename 01/14/12 0837 01/13/12 1648 01/13/12 1315  NA 135 -- 135  K 4.1 -- 4.3  CL 102 -- 103  CO2 25 -- 24  GLUCOSE 131* -- 101*  BUN 15 -- 21  CREATININE 0.92 -- 1.06  CALCIUM 8.6 -- 8.5  MG -- 1.7 --  PHOS -- 3.1 --    Basename 01/13/12 1315  AST 18  ALT 9  ALKPHOS 109  BILITOT 0.2*  PROT 7.4  ALBUMIN 3.3*   No results found for this basename: LIPASE:2,AMYLASE:2 in the last 72 hours  Basename 01/14/12 0837 01/13/12 1315  WBC 6.3 5.5  NEUTROABS -- --  HGB 11.1* 9.6*  HCT 34.2* 29.8*  MCV 90.7 90.6  PLT 184 159    Basename 01/14/12 0837 01/14/12 0050 01/13/12 1648  CKTOTAL 94 91 89  CKMB 1.4 2.0 1.5  CKMBINDEX -- -- --  TROPONINI <0.30 <0.30 <0.30   No components found with this basename: POCBNP:3 No results found for this basename: DDIMER:2 in the last 72 hours No results found for this basename: HGBA1C:2 in the last 72 hours No results found for this  basename: CHOL:2,HDL:2,LDLCALC:2,TRIG:2,CHOLHDL:2,LDLDIRECT:2 in the last 72 hours No results found for this basename: TSH,T4TOTAL,FREET3,T3FREE,THYROIDAB in the last 72 hours No results found for this basename: VITAMINB12:2,FOLATE:2,FERRITIN:2,TIBC:2,IRON:2,RETICCTPCT:2 in the last 72 hours  Micro Results: No results found for this or any previous visit (from the past 240 hour(s)).  Studies/Results: Ct Head Wo Contrast  01/13/2012  *RADIOLOGY REPORT*  Clinical Data: Larey Seat yesterday hitting head with loss of consciousness, bruising  CT HEAD WITHOUT CONTRAST  Technique:  Contiguous axial images were obtained from the base of the skull through the vertex without contrast.  Comparison: CT brain scan of 12/08/2011  Findings: The ventricular system remains prominent as are the cortical sulci diffusely consistent with diffuse atrophy.  The septum remains midline in position.  Moderate small vessel ischemic change is again noted throughout the periventricular white matter and small lacunar infarcts are present bilaterally.  No hemorrhage, mass effect or acute infarction is seen. There appears be chronic opacification of the maxillary sinuses consistent with chronic sinusitis involving the ethmoid air cells and frontal sinuses as well.  No acute calvarial abnormality is seen.  There does appear to be a right frontal scalp hematoma present.  IMPRESSION:  1.  Atrophy and moderate small vessel ischemic  change.  No acute intracranial abnormality. 2.  Chronic sinusitis. 3.  Right frontal scalp hematoma.  Original Report Authenticated By: Juline Patch, M.D.   Mr Brain Wo Contrast  01/14/2012  *RADIOLOGY REPORT*  Clinical Data: Syncopal episode.  Trauma to the head and face.  MRI HEAD WITHOUT CONTRAST  Technique:  Multiplanar, multiecho pulse sequences of the brain and surrounding structures were obtained according to standard protocol without intravenous contrast.  Comparison: Head CT 01/13/2012.  MRI 12/08/2011.   Findings: Diffusion imaging does not show any acute or subacute infarction.  There are chronic small vessel changes within the pons.  The cerebellum shows atrophy with a few old small vessel infarctions.  The cerebral hemispheres show advanced generalized atrophy with chronic small vessel changes throughout the deep and subcortical white matter.  There are old lacunar infarctions affecting the basal ganglia and thalami.  No large vessel territory infarction.  No mass lesion, hemorrhage, hydrocephalus or extra- axial collection.  Ventriculomegaly is in proportion to the degree of atrophy.  No pituitary mass.  There is chronic opacification of the maxillary, ethmoid and frontal sinuses and partially of the right division of the sphenoid sinus.  IMPRESSION: Pronounced atrophy and chronic small vessel change diffusely.  No acute or reversible findings intracranially.  Changes of chronic sinusitis  Original Report Authenticated By: Thomasenia Sales, M.D.   Dg Chest Port 1 View  01/13/2012  *RADIOLOGY REPORT*  Clinical Data: Syncope, hypertension.  PORTABLE CHEST - 1 VIEW  Comparison: None.  Findings: Linear atelectasis or scarring in the lung bases.  Lungs otherwise clear.  Heart is normal size.  No effusions or acute bony abnormality.  IMPRESSION: Bibasilar scarring or atelectasis.  Original Report Authenticated By: Cyndie Chime, M.D.    Medications:     . atenolol  25 mg Oral Daily  . iron polysaccharides  150 mg Oral Daily  . magnesium sulfate LVP 250-500 ml  2 g Intravenous Once  . sodium chloride  3 mL Intravenous Q12H  . DISCONTD: magnesium sulfate 1 - 4 g bolus IVPB  2 g Intravenous Once    Assessment: Principal Problem:  *Syncope Active Problems:  Seizures  HTN (hypertension)  Anemia   Plan: #1 syncope Questionable etiology. Patient with no further syncopal episodes. MRI of the head was negative for any acute abnormalities. Preliminary findings of carotid Dopplers were negative for any  significant ICA stenosis. Cardiac enzymes were negative x3. 2-D echo with EF of 60-65%. Mild LVH. No arrhythmias noted on telemetry. EEG is pending. Monitor.  #2 seizures Stable. Patient with elevated Dilantin levels and a such will hold Dilantin today. EEG is pending.  #3 hypertension Continue atenolol  #4 anemia H&H stable.   LOS: 1 day   Pervis Macintyre [phone] 01/14/2012, 7:17 PM

## 2012-01-14 NOTE — Progress Notes (Signed)
Portable EEG completed at [facility]. 

## 2012-01-14 NOTE — Progress Notes (Signed)
*  PRELIMINARY RESULTS* Echocardiogram 2D Echocardiogram has been performed.  Carlos Shields 01/14/2012, 1:33 PM

## 2012-01-14 NOTE — Progress Notes (Signed)
CRITICAL VALUE ALERT  Critical value received:  Dilantin 37.6  Date of notification:  01/14/2012   Time of notification:  1050   Critical value read back:yes  Nurse who received alert:  K. Vear Clock RN   MD notified (1st page):  Janee Morn  Time of first page:  1052  MD notified (2nd page):  Time of second page:  Responding MD:  Janee Morn  Time MD responded:  1055

## 2012-01-14 NOTE — Progress Notes (Signed)
   CARE MANAGEMENT NOTE 01/14/2012  Patient:  Carlos Shields,Carlos Shields   Account Number:  [id number]  Date Initiated:  01/14/2012  Documentation initiated by:  Jiles Crocker  Subjective/Objective Assessment:   ADMITTED WITH SYNCOPY     Action/Plan:   Primary Care Physician:KILPATRICK Minda Meo, MD; LIVES AT HOME   Anticipated DC Date:  01/21/2012   Anticipated DC Plan:  HOME/SELF CARE      DC Planning Services  CM consult            Status of service:  In process, will continue to follow Medicare Important Message given?  NA - LOS <3 / Initial given by admissions (If response is "NO", the following Medicare IM given date fields will be blank)  Per UR Regulation:  Reviewed for med. necessity/level of care/duration of stay  Comments:  01/14/2012- B Aliyyah Riese RN, Shari Prows

## 2012-01-14 NOTE — Procedures (Signed)
EEG NUMBER:  [id number].  This routine EEG was requested in a 78 year old man, who is admitted with 2 bouts of syncope.  He also has a history of seizures.  His last seizure was a month ago.  He is taking phenytoin.  EEG was done with the patient awake and drowsy.  During periods of maximal wakefulness, he had a 10-11 cycle per second moderately regulated and moderately sustained low amplitude alpha rhythm that attenuated with eye opening was symmetric.  Background activities are composed of a mixture of both low-amplitude alpha and some beta activities.  The beta activities were frontally dominant.  Photic stimulation did not produce a significant driving response. Hyperventilation for 3 minutes was not performed.  The patient spent much of the tracing in drowsy state.  This was characterized by an attenuation of muscle activity as well as the alpha rhythm.  There is appearance of diffuse theta and delta activities as well.  CLINICAL INTERPRETATION:  This routine EEG done with the patient awake and drowsy is normal.          ______________________________ Denton Meek, MD    OZ:HYQM D:  01/14/2012 23:27:33  T:  01/14/2012 23:48:11  Job #:  [id number]

## 2012-01-15 LAB — CBC
Hemoglobin: 11.1 g/dL — ABNORMAL LOW (ref 13.0–17.0)
MCH: 29.6 pg (ref 26.0–34.0)
MCHC: 32.8 g/dL (ref 30.0–36.0)

## 2012-01-15 LAB — BASIC METABOLIC PANEL
BUN: 18 mg/dL (ref 6–23)
GFR calc non Af Amer: 79 mL/min — ABNORMAL LOW (ref >90)
Glucose, Bld: 97 mg/dL (ref 70–99)
Potassium: 4.1 mEq/L (ref 3.5–5.1)

## 2012-01-15 NOTE — Progress Notes (Signed)
Occupational Therapy Treatment Patient Details Name: Carlos Shields MRN: [id number] DOB: Aug 23, 1930 Today's Date: 01/15/2012 Time: 4098-1191 OT Time Calculation (min): 18 min  OT Assessment / Plan / Recommendation Comments on Treatment Session Pt did well - but needed verbal cues for safety and use of walker.                    Plan Discharge plan remains appropriate    Precautions / Restrictions Precautions Precautions: Fall Restrictions Weight Bearing Restrictions: No       ADL  Grooming: Wash/dry hands;Performed;Minimal assistance Where Assessed - Grooming: Supported standing;Other (comment) (with walker) Toilet Transfer: Performed;Minimal assistance;Other (comment) (with walker) Toilet Transfer Method: Other (comment) (walk to BR with walker) Toilet Transfer Equipment: Regular height toilet;Grab bars Toileting - Clothing Manipulation and Hygiene: Performed;Min guard Where Assessed - Toileting Clothing Manipulation and Hygiene: Standing Transfers/Ambulation Related to ADLs: Pt needs 24/7 assist at home for safety with walking and ADL activity     OT Goals ADL Goals ADL Goal: Grooming - Progress: Progressing toward goals ADL Goal: Toilet Transfer - Progress: Progressing toward goals ADL Goal: Toileting - Hygiene - Progress: Progressing toward goals  Visit Information  Last OT Received On: 01/15/12          Cognition  Overall Cognitive Status: History of cognitive impairments - at baseline    Mobility Transfers Transfers: Sit to Stand;Stand to Sit Sit to Stand: From chair/3-in-1;From toilet;4: Min assist;With upper extremity assist Stand to Sit: To bed;To chair/3-in-1;4: Min assist;With upper extremity assist Details for Transfer Assistance: Pt needed cueing for hand placement and use of walker         End of Session OT - End of Session Activity Tolerance: Patient tolerated treatment well Patient left: in chair;with call bell/phone within reach       [facility],  Karin Golden D 01/15/2012, 11:39 AM

## 2012-01-15 NOTE — Discharge Summary (Signed)
Discharge Summary  Carlos Shields MR#: [id number]  DOB:Oct 14, 1930  Date of Admission: 01/13/2012 Date of Discharge: 01/15/2012  Patient's PCP: Eino Farber, MD  Attending Physician:THOMPSON,DANIEL  Consults:   None  Discharge Diagnoses: Syncope Present on Admission:  .Seizures .HTN (hypertension) .Anemia   Brief Admitting History and Physical Carlos Shields is a 78 year old African American gentleman history of seizure disorder on Dilantin, history of hypertension, prior history of pancreatitis who presents to the ED with 2 syncopal episodes. Patient stated that one day prior to admission he was walking home from the store when he suddenly fell down on the concrete and passed out. Patient states that he was passed out for approximately 5 minutes subselectively woke up and made his way home. Patient denies any dizziness, no premonitory symptoms. Patient states that on the day of admission he was at home and his liberal when he fell again hitting his head. Patient stated that he subsequently blacked out for approximately also 5 minutes. Patient stated that his sister-in-law found him and subsequently he was brought to the ED. Patient denies any seizure activity. Patient denies any fever, no chest pain, shortness of breath, no cough, no palpitations, no abdominal pain, no constipation, no dizziness, no weakness, no nausea, no vomiting, no diarrhea, no constipation, no abdominal pain, no back pain, no neck pain. Patient denies any melena or hematochezia. Patient denies any numbness and no visual changes. Patient denies any change in his medications. Patient states he's been compliant with all his medications. Patient does endorse some dysuria. Patient was seen in the ED CT of the head which was done showed right frontal scalp hematoma and chronic sinusitis with no acute intracranial abnormality. Urinalysis which was done was nitrite negative with trace leukocytes and 0-2 WBCs. Comprehensive metabolic  profile which was done had an albumin of 3.3 otherwise was within normal limits. CBC which was done had a hemoglobin of 9.6 which seems to be patient's baseline otherwise was within normal limits. Dilantin level was checked which came back elevated at 30.5. Will call to admit the patient for further evaluation and management. For the rest of admission history and physical please see H&P dictated by Dr. Janee Morn.  Discharge Medications Medication List  As of 01/15/2012 12:17 PM   CONTINUE taking these medications         atenolol 25 MG tablet   Commonly known as: TENORMIN      iron polysaccharides 150 MG capsule   Commonly known as: NIFEREX         STOP taking these medications         phenytoin 200 MG ER capsule           Hospital Course: #1:Syncope Patient was admitted with 2 episodes of syncope. Patient was admitted to the telemetry floor and placed on telemetry. Patient did not have any arrhythmias noted on telemetry. MRI of the head was done which was negative. Carotid Dopplers were done which were negative for any significant ICA stenosis. 2-D echo was also done which showed an EF of 60-65% and mild LVH. EEG was done which was negative for any epileptiform discharges. Patient's Dilantin levels were checked and came back elevated as high as 37.6. Patient's Dilantin was held during the hospitalization. Patient did not have any further syncopal episodes noted and new be discharged in stable and improved condition. Patient was seen by PT OT during the hospitalization and home health PT was recommended as well as a rolling walker. Patient be discharged home  in stable and improved condition to followup with his PCP as outpatient one week post discharge.  #2 history of seizures On admission due to patient's presentation of syncope and EEG was obtained which was negative for any epileptiform discharges. Patient did state that he was compliant with his medications. Dilantin levels were checked  and initial Dilantin level was elevated at 30.5. Repeat Dilantin level came back at 37.6. Patient's Dilantin was held during the hospitalization. Patient will need a Dilantin level drawn on Monday, 01/17/2012 results called into his PCP for further recommendations on when to resume his Dilantin. Patient be discharged home off of his Dilantin. Patient will need to followup with his PCP one week post discharge for further recommendations on his Dilantin.  The rest of patient's chronic medical issues remained stable throughout the hospitalization the patient was discharged in stable and improved condition.   Present on Admission:  .Seizures .HTN (hypertension) .Anemia   Day of Discharge BP 120/69  Pulse 80  Temp 98.3 F (36.8 C) (Oral)  Resp 19  Ht 5\' 2"  (1.575 m)  Wt 53.1 kg (117 lb 1 oz)  BMI 21.41 kg/m2  SpO2 99% Subjective: No complaints. No syncopal episodes. General: Alert, awake, oriented x3, in no acute distress. HEENT: No bruits, no goiter. Heart: Regular rate and rhythm, without murmurs, rubs, gallops. Lungs: Clear to auscultation bilaterally. Abdomen: Soft, nontender, nondistended, positive bowel sounds. Extremities: No clubbing cyanosis or edema with positive pedal pulses. Neuro: Grossly intact, nonfocal.   Results for orders placed during the hospital encounter of 01/13/12 (from the past 48 hour(s))  COMPREHENSIVE METABOLIC PANEL     Status: Abnormal   Collection Time   01/13/12  1:15 PM      Component Value Range Comment   Sodium 135  135 - 145 mEq/L    Potassium 4.3  3.5 - 5.1 mEq/L    Chloride 103  96 - 112 mEq/L    CO2 24  19 - 32 mEq/L    Glucose, Bld 101 (*) 70 - 99 mg/dL    BUN 21  6 - 23 mg/dL    Creatinine, Ser 0.98  0.50 - 1.35 mg/dL    Calcium 8.5  8.4 - 11.9 mg/dL    Total Protein 7.4  6.0 - 8.3 g/dL    Albumin 3.3 (*) 3.5 - 5.2 g/dL    AST 18  0 - 37 U/L    ALT 9  0 - 53 U/L    Alkaline Phosphatase 109  39 - 117 U/L    Total Bilirubin 0.2 (*) 0.3 -  1.2 mg/dL    GFR calc non Af Amer 65 (*) >90 mL/min    GFR calc Af Amer 76 (*) >90 mL/min   CBC     Status: Abnormal   Collection Time   01/13/12  1:15 PM      Component Value Range Comment   WBC 5.5  4.0 - 10.5 K/uL    RBC 3.29 (*) 4.22 - 5.81 MIL/uL    Hemoglobin 9.6 (*) 13.0 - 17.0 g/dL    HCT 14.7 (*) 82.9 - 52.0 %    MCV 90.6  78.0 - 100.0 fL    MCH 29.2  26.0 - 34.0 pg    MCHC 32.2  30.0 - 36.0 g/dL    RDW 56.2  13.0 - 86.5 %    Platelets 159  150 - 400 K/uL   PHENYTOIN LEVEL, TOTAL     Status: Abnormal   Collection  Time   01/13/12  1:15 PM      Component Value Range Comment   Phenytoin Lvl 30.5 (*) 10.0 - 20.0 ug/mL   ETHANOL     Status: Normal   Collection Time   01/13/12  1:15 PM      Component Value Range Comment   Alcohol, Ethyl (B) <11  0 - 11 mg/dL   URINALYSIS, ROUTINE W REFLEX MICROSCOPIC     Status: Abnormal   Collection Time   01/13/12  1:29 PM      Component Value Range Comment   Color, Urine YELLOW  YELLOW    APPearance CLEAR  CLEAR    Specific Gravity, Urine 1.023  1.005 - 1.030    pH 5.5  5.0 - 8.0    Glucose, UA NEGATIVE  NEGATIVE mg/dL    Hgb urine dipstick NEGATIVE  NEGATIVE    Bilirubin Urine NEGATIVE  NEGATIVE    Ketones, ur NEGATIVE  NEGATIVE mg/dL    Protein, ur NEGATIVE  NEGATIVE mg/dL    Urobilinogen, UA 0.2  0.0 - 1.0 mg/dL    Nitrite NEGATIVE  NEGATIVE    Leukocytes, UA TRACE (*) NEGATIVE   URINE RAPID DRUG SCREEN (HOSP PERFORMED)     Status: Normal   Collection Time   01/13/12  1:29 PM      Component Value Range Comment   Opiates NONE DETECTED  NONE DETECTED    Cocaine NONE DETECTED  NONE DETECTED    Benzodiazepines NONE DETECTED  NONE DETECTED    Amphetamines NONE DETECTED  NONE DETECTED    Tetrahydrocannabinol NONE DETECTED  NONE DETECTED    Barbiturates NONE DETECTED  NONE DETECTED   URINE MICROSCOPIC-ADD ON     Status: Normal   Collection Time   01/13/12  1:29 PM      Component Value Range Comment   Squamous Epithelial / LPF RARE  RARE     WBC, UA 0-2  <3 WBC/hpf   MAGNESIUM     Status: Normal   Collection Time   01/13/12  4:48 PM      Component Value Range Comment   Magnesium 1.7  1.5 - 2.5 mg/dL   CARDIAC PANEL(CRET KIN+CKTOT+MB+TROPI)     Status: Normal   Collection Time   01/13/12  4:48 PM      Component Value Range Comment   Total CK 89  7 - 232 U/L    CK, MB 1.5  0.3 - 4.0 ng/mL    Troponin I <0.30  <0.30 ng/mL    Relative Index RELATIVE INDEX IS INVALID  0.0 - 2.5   PHOSPHORUS     Status: Normal   Collection Time   01/13/12  4:48 PM      Component Value Range Comment   Phosphorus 3.1  2.3 - 4.6 mg/dL   CARDIAC PANEL(CRET KIN+CKTOT+MB+TROPI)     Status: Normal   Collection Time   01/14/12 12:50 AM      Component Value Range Comment   Total CK 91  7 - 232 U/L    CK, MB 2.0  0.3 - 4.0 ng/mL    Troponin I <0.30  <0.30 ng/mL    Relative Index RELATIVE INDEX IS INVALID  0.0 - 2.5   BASIC METABOLIC PANEL     Status: Abnormal   Collection Time   01/14/12  8:37 AM      Component Value Range Comment   Sodium 135  135 - 145 mEq/L    Potassium 4.1  3.5 - 5.1 mEq/L    Chloride 102  96 - 112 mEq/L    CO2 25  19 - 32 mEq/L    Glucose, Bld 131 (*) 70 - 99 mg/dL    BUN 15  6 - 23 mg/dL    Creatinine, Ser 1.61  0.50 - 1.35 mg/dL    Calcium 8.6  8.4 - 09.6 mg/dL    GFR calc non Af Amer 79 (*) >90 mL/min    GFR calc Af Amer >90  >90 mL/min   CBC     Status: Abnormal   Collection Time   01/14/12  8:37 AM      Component Value Range Comment   WBC 6.3  4.0 - 10.5 K/uL    RBC 3.77 (*) 4.22 - 5.81 MIL/uL    Hemoglobin 11.1 (*) 13.0 - 17.0 g/dL REPEATED TO VERIFY   HCT 34.2 (*) 39.0 - 52.0 %    MCV 90.7  78.0 - 100.0 fL    MCH 29.4  26.0 - 34.0 pg    MCHC 32.5  30.0 - 36.0 g/dL    RDW 04.5  40.9 - 81.1 %    Platelets 184  150 - 400 K/uL   PHENYTOIN LEVEL, TOTAL     Status: Abnormal   Collection Time   01/14/12  8:37 AM      Component Value Range Comment   Phenytoin Lvl 37.6 (*) 10.0 - 20.0 ug/mL   CARDIAC PANEL(CRET  KIN+CKTOT+MB+TROPI)     Status: Normal   Collection Time   01/14/12  8:37 AM      Component Value Range Comment   Total CK 94  7 - 232 U/L    CK, MB 1.4  0.3 - 4.0 ng/mL    Troponin I <0.30  <0.30 ng/mL    Relative Index RELATIVE INDEX IS INVALID  0.0 - 2.5   MAGNESIUM     Status: Normal   Collection Time   01/15/12  4:46 AM      Component Value Range Comment   Magnesium 1.8  1.5 - 2.5 mg/dL   BASIC METABOLIC PANEL     Status: Abnormal   Collection Time   01/15/12  4:46 AM      Component Value Range Comment   Sodium 133 (*) 135 - 145 mEq/L    Potassium 4.1  3.5 - 5.1 mEq/L    Chloride 102  96 - 112 mEq/L    CO2 21  19 - 32 mEq/L    Glucose, Bld 97  70 - 99 mg/dL    BUN 18  6 - 23 mg/dL    Creatinine, Ser 9.14  0.50 - 1.35 mg/dL    Calcium 8.8  8.4 - 78.2 mg/dL    GFR calc non Af Amer 79 (*) >90 mL/min    GFR calc Af Amer >90  >90 mL/min   CBC     Status: Abnormal   Collection Time   01/15/12  4:46 AM      Component Value Range Comment   WBC 6.5  4.0 - 10.5 K/uL    RBC 3.75 (*) 4.22 - 5.81 MIL/uL    Hemoglobin 11.1 (*) 13.0 - 17.0 g/dL    HCT 95.6 (*) 21.3 - 52.0 %    MCV 90.1  78.0 - 100.0 fL    MCH 29.6  26.0 - 34.0 pg    MCHC 32.8  30.0 - 36.0 g/dL    RDW 08.6  57.8 - 46.9 %  Platelets 194  150 - 400 K/uL     Ct Head Wo Contrast  01/13/2012  *RADIOLOGY REPORT*  Clinical Data: Larey Seat yesterday hitting head with loss of consciousness, bruising  CT HEAD WITHOUT CONTRAST  Technique:  Contiguous axial images were obtained from the base of the skull through the vertex without contrast.  Comparison: CT brain scan of 12/08/2011  Findings: The ventricular system remains prominent as are the cortical sulci diffusely consistent with diffuse atrophy.  The septum remains midline in position.  Moderate small vessel ischemic change is again noted throughout the periventricular white matter and small lacunar infarcts are present bilaterally.  No hemorrhage, mass effect or acute infarction is  seen. There appears be chronic opacification of the maxillary sinuses consistent with chronic sinusitis involving the ethmoid air cells and frontal sinuses as well.  No acute calvarial abnormality is seen.  There does appear to be a right frontal scalp hematoma present.  IMPRESSION:  1.  Atrophy and moderate small vessel ischemic change.  No acute intracranial abnormality. 2.  Chronic sinusitis. 3.  Right frontal scalp hematoma.  Original Report Authenticated By: Juline Patch, M.D.   Mr Brain Wo Contrast  01/14/2012  *RADIOLOGY REPORT*  Clinical Data: Syncopal episode.  Trauma to the head and face.  MRI HEAD WITHOUT CONTRAST  Technique:  Multiplanar, multiecho pulse sequences of the brain and surrounding structures were obtained according to standard protocol without intravenous contrast.  Comparison: Head CT 01/13/2012.  MRI 12/08/2011.  Findings: Diffusion imaging does not show any acute or subacute infarction.  There are chronic small vessel changes within the pons.  The cerebellum shows atrophy with a few old small vessel infarctions.  The cerebral hemispheres show advanced generalized atrophy with chronic small vessel changes throughout the deep and subcortical white matter.  There are old lacunar infarctions affecting the basal ganglia and thalami.  No large vessel territory infarction.  No mass lesion, hemorrhage, hydrocephalus or extra- axial collection.  Ventriculomegaly is in proportion to the degree of atrophy.  No pituitary mass.  There is chronic opacification of the maxillary, ethmoid and frontal sinuses and partially of the right division of the sphenoid sinus.  IMPRESSION: Pronounced atrophy and chronic small vessel change diffusely.  No acute or reversible findings intracranially.  Changes of chronic sinusitis  Original Report Authenticated By: Thomasenia Sales, M.D.   Dg Chest Port 1 View  01/13/2012  *RADIOLOGY REPORT*  Clinical Data: Syncope, hypertension.  PORTABLE CHEST - 1 VIEW  Comparison:  None.  Findings: Linear atelectasis or scarring in the lung bases.  Lungs otherwise clear.  Heart is normal size.  No effusions or acute bony abnormality.  IMPRESSION: Bibasilar scarring or atelectasis.  Original Report Authenticated By: Cyndie Chime, M.D.     Disposition: Home with home health PT and RN  Diet: Gen.  Activity: Increase activity slowly   Follow-up Appts: Discharge Orders    Future Orders Please Complete By Expires   Diet general      Increase activity slowly      Discharge instructions      Comments:   Follow up with KILPATRICK JR,GEORGE R, MD in 1 week.      TESTS THAT NEED FOLLOW-UP Patient needs a Dilantin level checked on Monday, 01/17/2012 and results need to be sent to according to Dr. Corine Shelter patient's PCP.  Time spent on discharge, talking to the patient, and coordinating care: 45 mins.   SignedRamiro Harvest 01/15/2012, 12:17 PM

## 2012-02-09 NOTE — Progress Notes (Addendum)
01/14/12 0953  PT G-Codes **NOT FOR INPATIENT CLASS**  Functional Assessment Tool Used clinical judgement as reviewed in chart  Functional Limitation Mobility: Walking and moving around  Mobility: Walking and Moving Around Current Status (Y8657) CI  Mobility: Walking and Moving Around Goal Status (Q4696) CH   late entry for 01/14/2012 ,valuation performed by Irving Burton Page, Pt as as reviewed by assessment and clinical judgement in chart documentation the above G-codes were added.   Marella Bile, PT Pager: [phone] 02/09/2012

## 2012-02-09 NOTE — Progress Notes (Addendum)
01/14/12 1002  OT G-codes **NOT FOR INPATIENT CLASS**  Functional Assessment Tool Used clinical judgement as reviewed in chart  Functional Limitation Self care  Self Care Current Status (Z6109) CJ  Self Care Goal Status (U0454) CI    Late entry for 01/14/2012. Eval performed by Garrel Ridgel, OTR. Chart reviewed and added above G codes.  Feb 18, 2012 Cipriano Mile OTR/L Pager [phone] Office [phone]

## 2012-11-10 ENCOUNTER — Non-Acute Institutional Stay: Payer: Medicare Other | Admitting: Internal Medicine

## 2012-11-10 DIAGNOSIS — I10 Essential (primary) hypertension: Secondary | ICD-10-CM

## 2012-11-10 DIAGNOSIS — E1059 Type 1 diabetes mellitus with other circulatory complications: Secondary | ICD-10-CM

## 2012-11-10 DIAGNOSIS — J309 Allergic rhinitis, unspecified: Secondary | ICD-10-CM

## 2012-11-10 DIAGNOSIS — R569 Unspecified convulsions: Secondary | ICD-10-CM

## 2012-11-11 NOTE — Progress Notes (Signed)
PROGRESS NOTE  DATE: 11-10-12  FACILITY: [facility]  LEVEL OF CARE: Assisted living  Routine Visit  CHIEF COMPLAINT:  Manage seizure disorder, hypertension and allergic rhinitis  HISTORY OF PRESENT ILLNESS:  REASSESSMENT OF ONGOING PROBLEM(S):  1. HTN: Pt 's HTN remains stable.  Denies CP, sob, DOE, pedal edema, headaches, dizziness or visual disturbances.  No complications from the medications currently being used.  Last BP : 140/96, 122/62, 140/80.  2.SEIZURE DISORDER: The patient's seizure disorder remains stable. No complications reported from the medications presently being used. Staff do not report any recent seizure activity.  3.ALLERGIC RHINITIS: Allergic rhinitis remains stable.  Patient denies ongoing symptoms such as runny nose sneezing or tearing. No complications reported from the current medication(s) being used.  PAST MEDICAL HISTORY : Reviewed.  No changes.  CURRENT MEDICATIONS: Reviewed per [facility]  REVIEW OF SYSTEMS:  GENERAL: no change in appetite, no fatigue, no weight changes, no fever, chills or weakness RESPIRATORY: no cough, SOB, DOE, wheezing, hemoptysis CARDIAC: no chest pain, edema or palpitations GI: no abdominal pain, diarrhea, constipation, heart burn, nausea or vomiting  PHYSICAL EXAMINATION  VS:  T 98.2       P 92      RR 20      BP 140/96     POX %     WT (Lb) 137  GENERAL: no acute distress, normal body habitus EYES: conjunctivae normal, sclerae normal, normal eye lids NECK: supple, trachea midline, no neck masses, no thyroid tenderness, no thyromegaly LYMPHATICS: no LAN in the neck, no supraclavicular LAN RESPIRATORY: breathing is even & unlabored, BS CTAB CARDIAC: RRR, no murmur,no extra heart sounds, no edema GI: abdomen soft, normal BS, no masses, no tenderness, no hepatomegaly, no splenomegaly PSYCHIATRIC: the patient is alert & oriented to person, affect & behavior appropriate  LABS/RADIOLOGY:  2/14 BMP normal 1/14 hemoglobin  12 MCV 87 otherwise CBC normal, glucose 131 otherwise CMP normal 11/13 hemoglobin A1c 5.8  ASSESSMENT/PLAN:  1. seizure disorder-well controlled. 2. hypertension-stable. 3. allergic rhinitis-well-controlled. 4. diabetes mellitus-diet controlled. Check hemoglobin A1c. 5. atrial fibrillation-rate controlled. 6. 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 Continue supportive care. 7. osteoarthritis-denies ongoing pain.  CPT CODE: 16109

## 2013-03-16 ENCOUNTER — Non-Acute Institutional Stay: Payer: Medicare Other | Admitting: Internal Medicine

## 2013-03-16 DIAGNOSIS — R569 Unspecified convulsions: Secondary | ICD-10-CM

## 2013-03-16 DIAGNOSIS — J309 Allergic rhinitis, unspecified: Secondary | ICD-10-CM

## 2013-03-16 DIAGNOSIS — E119 Type 2 diabetes mellitus without complications: Secondary | ICD-10-CM

## 2013-03-16 DIAGNOSIS — I10 Essential (primary) hypertension: Secondary | ICD-10-CM

## 2013-03-17 DIAGNOSIS — I10 Essential (primary) hypertension: Secondary | ICD-10-CM | POA: Insufficient documentation

## 2013-03-17 DIAGNOSIS — J309 Allergic rhinitis, unspecified: Secondary | ICD-10-CM | POA: Insufficient documentation

## 2013-03-17 DIAGNOSIS — E119 Type 2 diabetes mellitus without complications: Secondary | ICD-10-CM | POA: Insufficient documentation

## 2013-03-17 DIAGNOSIS — R569 Unspecified convulsions: Secondary | ICD-10-CM | POA: Insufficient documentation

## 2013-03-17 NOTE — Progress Notes (Signed)
PROGRESS NOTE  DATE: 03-16-13  FACILITY: [facility]  LEVEL OF CARE: Assisted living  Routine Visit  CHIEF COMPLAINT:  Manage seizure disorder, hypertension and allergic rhinitis  HISTORY OF PRESENT ILLNESS:  REASSESSMENT OF ONGOING PROBLEM(S):  HTN: Pt 's HTN remains stable.  Denies CP, sob, DOE, pedal edema, headaches, dizziness or visual disturbances.  No complications from the medications currently being used.  Last BP : 140/96, 122/62, 140/80, 136/68.  SEIZURE DISORDER: The patient's seizure disorder remains stable. No complications reported from the medications presently being used. Staff do not report any recent seizure activity.  ALLERGIC RHINITIS: Allergic rhinitis remains stable.  Patient denies ongoing symptoms such as runny nose sneezing or tearing. No complications reported from the current medication(s) being used.  PAST MEDICAL HISTORY : Reviewed.  No changes.  CURRENT MEDICATIONS: Reviewed per [facility]  REVIEW OF SYSTEMS:  GENERAL: no change in appetite, no fatigue, no weight changes, no fever, chills or weakness RESPIRATORY: no cough, SOB, DOE, wheezing, hemoptysis CARDIAC: no chest pain, edema or palpitations GI: no abdominal pain, diarrhea, constipation, heart burn, nausea or vomiting  PHYSICAL EXAMINATION  VS:  T 98.3       P 78      RR 20      BP 136/68     POX %     WT (Lb) 138  GENERAL: no acute distress, normal body habitus EYES: conjunctivae normal, sclerae normal, normal eye lids NECK: supple, trachea midline, no neck masses, no thyroid tenderness, no thyromegaly LYMPHATICS: no LAN in the neck, no supraclavicular LAN RESPIRATORY: breathing is even & unlabored, BS CTAB CARDIAC: RRR, no murmur,no extra heart sounds, no edema GI: abdomen soft, normal BS, no masses, no tenderness, no hepatomegaly, no splenomegaly PSYCHIATRIC: the patient is alert & oriented to person, affect & behavior appropriate  LABS/RADIOLOGY:  7/14 FLP nl 5/14 cbc & cmp nl,  HbA1c 5.8  2/14 BMP normal 1/14 hemoglobin 12 MCV 87 otherwise CBC normal, glucose 131 otherwise CMP normal 11/13 hemoglobin A1c 5.8  ASSESSMENT/PLAN:  seizure disorder-well controlled. hypertension-stable. allergic rhinitis-well-controlled. diabetes mellitus-diet controlled.  atrial fibrillation-rate controlled. 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 Continue supportive care. osteoarthritis-denies ongoing pain.  CPT CODE: 16109

## 2013-03-23 ENCOUNTER — Non-Acute Institutional Stay: Payer: Medicare Other | Admitting: Internal Medicine

## 2013-03-23 DIAGNOSIS — J029 Acute pharyngitis, unspecified: Secondary | ICD-10-CM

## 2013-04-23 DIAGNOSIS — J029 Acute pharyngitis, unspecified: Secondary | ICD-10-CM | POA: Insufficient documentation

## 2013-04-23 NOTE — Progress Notes (Signed)
Patient ID: Ronald Sutton, male   DOB: March 18, 1931, 79 y.o.   MRN: [id number]        PROGRESS NOTE  DATE: 03/23/2013  FACILITY:  [facility]  LEVEL OF CARE: ALF (13)  Acute Visit  CHIEF COMPLAINT:  Manage sore throat.    HISTORY OF PRESENT ILLNESS: I was requested by the staff to assess the patient regarding above problem(s):  Staff report that patient is having a sore throat and swelling and, therefore, he could not finish his lunch.  Patient is admitting to sore throat, but denies shortness of breath, chest pain, or cough.    PAST MEDICAL HISTORY : Reviewed.  No changes.  CURRENT MEDICATIONS: Reviewed per [facility]  PHYSICAL EXAMINATION  GENERAL: no acute distress, normal body habitus NECK: supple, trachea midline, no neck masses, no thyroid tenderness, no thyromegaly LYMPHATICS: no LAN in the neck, no supraclavicular LAN RESPIRATORY: breathing is even & unlabored, BS CTAB MOUTH/THROAT: throat appears normal    ASSESSMENT/PLAN:  Sore throat.  New problem.  Start Chloraseptic spray, 2 sprays to throat b.i.d. for five days  CPT CODE: 40981

## 2013-05-03 ENCOUNTER — Inpatient Hospital Stay (HOSPITAL_COMMUNITY)
Admission: EM | Admit: 2013-05-03 | Discharge: 2013-05-04 | DRG: 101 | Disposition: A | Discharge status: Home / Self Care | Payer: Medicare Other | Attending: Internal Medicine | Admitting: Internal Medicine

## 2013-05-03 ENCOUNTER — Emergency Department (HOSPITAL_COMMUNITY): Payer: Medicare Other

## 2013-05-03 DIAGNOSIS — R569 Unspecified convulsions: Secondary | ICD-10-CM

## 2013-05-03 DIAGNOSIS — I10 Essential (primary) hypertension: Secondary | ICD-10-CM

## 2013-05-03 DIAGNOSIS — F17200 Nicotine dependence, unspecified, uncomplicated: Secondary | ICD-10-CM | POA: Diagnosis present

## 2013-05-03 DIAGNOSIS — G40909 Epilepsy, unspecified, not intractable, without status epilepticus: Principal | ICD-10-CM | POA: Diagnosis present

## 2013-05-03 DIAGNOSIS — D649 Anemia, unspecified: Secondary | ICD-10-CM

## 2013-05-03 DIAGNOSIS — R55 Syncope and collapse: Secondary | ICD-10-CM

## 2013-05-03 DIAGNOSIS — Z79899 Other long term (current) drug therapy: Secondary | ICD-10-CM

## 2013-05-03 DIAGNOSIS — G9340 Encephalopathy, unspecified: Secondary | ICD-10-CM

## 2013-05-03 LAB — BASIC METABOLIC PANEL
BUN: 22 mg/dL (ref 6–23)
CO2: 21 mEq/L (ref 19–32)
Calcium: 8.6 mg/dL (ref 8.4–10.5)
Chloride: 103 mEq/L (ref 96–112)
Creatinine, Ser: 1.22 mg/dL (ref 0.50–1.35)
GFR calc Af Amer: 63 mL/min — ABNORMAL LOW (ref >90)
GFR calc non Af Amer: 54 mL/min — ABNORMAL LOW (ref >90)
Glucose, Bld: 95 mg/dL (ref 70–99)
Potassium: 4.1 mEq/L (ref 3.5–5.1)
Sodium: 135 mEq/L (ref 135–145)

## 2013-05-03 LAB — URINALYSIS, ROUTINE W REFLEX MICROSCOPIC
Bilirubin Urine: NEGATIVE
Glucose, UA: NEGATIVE mg/dL
Hgb urine dipstick: NEGATIVE
Ketones, ur: NEGATIVE mg/dL
Leukocytes, UA: NEGATIVE
Nitrite: NEGATIVE
Protein, ur: NEGATIVE mg/dL
Specific Gravity, Urine: 1.022 (ref 1.005–1.030)
Urobilinogen, UA: 0.2 mg/dL (ref 0.0–1.0)
pH: 5 (ref 5.0–8.0)

## 2013-05-03 LAB — CBC WITH DIFFERENTIAL/PLATELET
Basophils Absolute: 0 10*3/uL (ref 0.0–0.1)
Basophils Relative: 1 % (ref 0–1)
Eosinophils Absolute: 0.3 10*3/uL (ref 0.0–0.7)
Eosinophils Relative: 6 % — ABNORMAL HIGH (ref 0–5)
HCT: 26.2 % — ABNORMAL LOW (ref 39.0–52.0)
Hemoglobin: 8.9 g/dL — ABNORMAL LOW (ref 13.0–17.0)
Lymphocytes Relative: 28 % (ref 12–46)
Lymphs Abs: 1.4 10*3/uL (ref 0.7–4.0)
MCH: 30.6 pg (ref 26.0–34.0)
MCHC: 34 g/dL (ref 30.0–36.0)
MCV: 90 fL (ref 78.0–100.0)
Monocytes Absolute: 0.5 10*3/uL (ref 0.1–1.0)
Monocytes Relative: 10 % (ref 3–12)
Neutro Abs: 2.8 10*3/uL (ref 1.7–7.7)
Neutrophils Relative %: 56 % (ref 43–77)
Platelets: 172 10*3/uL (ref 150–400)
RBC: 2.91 MIL/uL — ABNORMAL LOW (ref 4.22–5.81)
RDW: 13 % (ref 11.5–15.5)
WBC: 5.1 10*3/uL (ref 4.0–10.5)

## 2013-05-03 LAB — PHENYTOIN LEVEL, TOTAL: Phenytoin Lvl: 11.7 ug/mL (ref 10.0–20.0)

## 2013-05-03 MED ORDER — LORAZEPAM 2 MG/ML IJ SOLN
1.0000 mg | Freq: Once | INTRAMUSCULAR | Status: AC
  Administered 2013-05-03: 1 mg via INTRAVENOUS

## 2013-05-03 MED ORDER — LORAZEPAM 2 MG/ML IJ SOLN
1.0000 mg | Freq: Once | INTRAMUSCULAR | Status: AC
  Administered 2013-05-03: 1 mg via INTRAVENOUS
  Filled 2013-05-03: qty 1

## 2013-05-03 MED ORDER — LORAZEPAM 2 MG/ML IJ SOLN: 2.0000 mg | Freq: Once | INTRAMUSCULAR | Status: DC

## 2013-05-03 MED ORDER — LORAZEPAM 2 MG/ML IJ SOLN
INTRAMUSCULAR | Status: AC
  Filled 2013-05-03: qty 1

## 2013-05-03 NOTE — ED Notes (Signed)
Patient alert and oriented, resting comfortably watching TV Patient denies c/o pain Patient denies needs at this time Side rails up, call bell in reach, seizure pads remain in place Will continue to monitor patient

## 2013-05-03 NOTE — ED Notes (Signed)
Per EMS patient with Hx of epilepsy was at home alone and had an unwitnessed seizure, patient's grand children found him on the floor shaking. On arrival EMS reports that patient was post-ictal. Per EMS patient's baseline mental status is A&O x 4, patient is currently O x 4, some lethargy. Denies pain, abrasion to left lower lip.

## 2013-05-03 NOTE — ED Notes (Signed)
Patient continues to rest with eyes closed s/p admin of multiple doses of Ativan, see MAR RR WNL--even and unlabored with equal rise and fall of chest Patient remains on monitor  Patient's daughter remains at bedside--denies complaints or needs at this time Awaiting consult Seizure pads in place, side rails up, call bell in reach  Will continue to monitor

## 2013-05-03 NOTE — ED Notes (Signed)
Carlos Shields, ED NT remains at bedside Patient to be taken to CT department

## 2013-05-03 NOTE — ED Notes (Signed)
Bed: DG64 Expected date:  Expected time:  Means of arrival:  Comments: ems- seizure, hx of seizure

## 2013-05-03 NOTE — ED Notes (Signed)
Patient actively seizing Dr. Juleen China at bedside Ativan 1 mg IV given, see MAR Patient placed on O2

## 2013-05-03 NOTE — ED Notes (Signed)
Patient back from CT dept Patient remains post-ictal/sedated from admin of Ativan, see MAR Patient's daughter at bedside and informed of POC  Seizure pads in place, side rails up, call bell in reach

## 2013-05-03 NOTE — ED Provider Notes (Signed)
CSN: [id number]     Arrival date & time 05/03/13  1646 History   First MD Initiated Contact with Patient 05/03/13 1702     Chief Complaint  Patient presents with  . Seizures   (Consider location/radiation/quality/duration/timing/severity/associated sxs/prior Treatment) HPI  79 year old male presenting with a seizure. Patient was found by family members on the ground with generalized tonic-clonic activity. On EMS arrival he appeared post-ictal.  He has a past history of epilepsy. He is on Dilantin for the same. Patient is amnestic to the events. He currently has no complaints. Reports compliance with his medications. Been in his usual state of health the past several days. No fevers or chills. He has been sleeping well.  She has seizures infrequently with this last one approximately one year ago. Denies any drug use or ingestion.  Past Medical History  Diagnosis Date  . Hypertension   . Seizures   . Pancreatitis    Past Surgical History  Procedure Laterality Date  . Hernia repair     Family History  Problem Relation Age of Onset  . Hypertension Other   . Diabetes Other   . Asthma Other    History  Substance Use Topics  . Smoking status: Current Some Day Smoker    Types: Cigars  . Smokeless tobacco: Never Used  . Alcohol Use: No    Review of Systems  All systems reviewed and negative, other than as noted in HPI.  Allergies  Review of patient's allergies indicates no known allergies.  Home Medications   Current Outpatient Rx  Name  Route  Sig  Dispense  Refill  . atenolol (TENORMIN) 25 MG tablet   Oral   Take 25 mg by mouth daily.         . phenytoin (DILANTIN) 100 MG ER capsule   Oral   Take 200 mg by mouth at bedtime.         Marland Kitchen EXPIRED: iron polysaccharides (NIFEREX) 150 MG capsule   Oral   Take 150 mg by mouth daily.          Temp(Src) 98.7 F (37.1 C) (Oral)  SpO2 98% Physical Exam  Nursing note and vitals reviewed. Constitutional: He is  oriented to person, place, and time. He appears well-developed and well-nourished. No distress.  HENT:  Head: Normocephalic and atraumatic.  Patient appears to have a small amount of dried blood at the corners of his mouth. No intraoral trauma or other source identified though.   Eyes: Conjunctivae and EOM are normal. Pupils are equal, round, and reactive to light. Right eye exhibits no discharge. Left eye exhibits no discharge.  Neck: Normal range of motion. Neck supple.  No nuchal rigidity  Cardiovascular: Normal rate, regular rhythm and normal heart sounds.  Exam reveals no gallop and no friction rub.   No murmur heard. Pulmonary/Chest: Effort normal and breath sounds normal. No respiratory distress.  Abdominal: Soft. He exhibits no distension. There is no tenderness.  Musculoskeletal: He exhibits no edema and no tenderness.  Neurological: He is alert and oriented to person, place, and time. No cranial nerve deficit. He exhibits normal muscle tone. Coordination normal.  Speech clear. Content appropriate.   Skin: Skin is warm and dry.  Psychiatric: He has a normal mood and affect. His behavior is normal. Thought content normal.    ED Course  Procedures (including critical care time) Labs Review Labs Reviewed  CBC WITH DIFFERENTIAL - Abnormal; Notable for the following:    RBC 2.91 (*)  Hemoglobin 8.9 (*)    HCT 26.2 (*)    Eosinophils Relative 6 (*)    All other components within normal limits  BASIC METABOLIC PANEL - Abnormal; Notable for the following:    GFR calc non Af Amer 54 (*)    GFR calc Af Amer 63 (*)    All other components within normal limits  IRON AND TIBC - Abnormal; Notable for the following:    TIBC 197 (*)    All other components within normal limits  RETICULOCYTES - Abnormal; Notable for the following:    RBC. 2.69 (*)    All other components within normal limits  TSH - Abnormal; Notable for the following:    TSH 4.621 (*)    All other components within  normal limits  BASIC METABOLIC PANEL - Abnormal; Notable for the following:    Sodium 133 (*)    GFR calc non Af Amer 61 (*)    GFR calc Af Amer 70 (*)    All other components within normal limits  CBC - Abnormal; Notable for the following:    RBC 3.18 (*)    Hemoglobin 9.7 (*)    HCT 28.4 (*)    All other components within normal limits  URINE CULTURE  URINALYSIS, ROUTINE W REFLEX MICROSCOPIC  PHENYTOIN LEVEL, TOTAL  VITAMIN B12  FOLATE  FERRITIN  MAGNESIUM  PHOSPHORUS  TROPONIN I  TROPONIN I  TROPONIN I  URINALYSIS, ROUTINE W REFLEX MICROSCOPIC  URINE RAPID DRUG SCREEN (HOSP PERFORMED)   Imaging Review No results found.  Ct Head Wo Contrast  05/03/2013   CLINICAL DATA:  Seizure  EXAM: CT HEAD WITHOUT CONTRAST  TECHNIQUE: Contiguous axial images were obtained from the base of the skull through the vertex without intravenous contrast.  COMPARISON:  CT 01/13/2012.  FINDINGS: Moderate to advanced atrophy. Chronic microvascular ischemic change in the white matter. Chronic ischemia in the thalami and basal ganglia bilaterally.  Negative for acute infarct, hemorrhage, mass.  Complete opacification of the paranasal sinuses bilaterally. Periapical lucency around upper molars bilaterally compatible with dental infection.  IMPRESSION: Atrophy and chronic microvascular ischemia. No acute intracranial abnormality.  Severe chronic sinusitis.  Dental disease.   Electronically Signed   By: [name] M.D.   On: 05/03/2013 21:56   Portable Chest 1 View  05/04/2013   CLINICAL DATA:  Seizures. History of seizures, hypertension and pancreatitis.  EXAM: PORTABLE CHEST - 1 VIEW  COMPARISON:  01/13/2012.  FINDINGS: 0510 hr. There are low lung volumes with mild bibasilar atelectasis. No confluent airspace opacity, pleural effusion or pneumothorax is present. The heart size and mediastinal contours are stable. No acute osseous findings are seen.  IMPRESSION: Mild bibasilar atelectasis. No acute  findings identified.   Electronically Signed   By: [name] M.D.   On: 05/04/2013 07:07   EKG Interpretation   None       MDM   1. Seizure   2. Anemia   3. Encephalopathy   4. Seizures   5. HTN (hypertension)   6. Syncope      79 year old male with seizure. History the same, although he has them infrequently. Workup initially unremarkable the patient was observed emergency room with his Dilantin level pending. During this time he had repeat seizure requiring use of Ativan. Cc the head was all done also done which did not show any acute process. Dilantin level came back normal.  Seizure activity abated the patient remains drowsy. This may be combination of  postictal state and/or from administered Ativan. Given more than one seizure and not back to his baseline currently, will admit for further evaluation.    Raeford Razor, MD 05/07/13 [phone]

## 2013-05-03 NOTE — ED Notes (Signed)
Additional dose of Ativan 1 mg given, see MAR

## 2013-05-03 NOTE — ED Notes (Signed)
Patient resting with eyes closed s/p admin of Ativan, see MAR Patient is post-ictal VS stable Patient taken to CT dept

## 2013-05-04 ENCOUNTER — Inpatient Hospital Stay (HOSPITAL_COMMUNITY): Payer: Medicare Other

## 2013-05-04 ENCOUNTER — Encounter (HOSPITAL_COMMUNITY): Payer: Self-pay | Admitting: Oncology

## 2013-05-04 DIAGNOSIS — R569 Unspecified convulsions: Secondary | ICD-10-CM

## 2013-05-04 DIAGNOSIS — I10 Essential (primary) hypertension: Secondary | ICD-10-CM

## 2013-05-04 DIAGNOSIS — D649 Anemia, unspecified: Secondary | ICD-10-CM

## 2013-05-04 DIAGNOSIS — G9340 Encephalopathy, unspecified: Secondary | ICD-10-CM

## 2013-05-04 LAB — URINALYSIS, ROUTINE W REFLEX MICROSCOPIC
Bilirubin Urine: NEGATIVE
Hgb urine dipstick: NEGATIVE
Ketones, ur: NEGATIVE mg/dL
Leukocytes, UA: NEGATIVE
Protein, ur: NEGATIVE mg/dL
Specific Gravity, Urine: 1.01 (ref 1.005–1.030)
Urobilinogen, UA: 0.2 mg/dL (ref 0.0–1.0)
pH: 6.5 (ref 5.0–8.0)

## 2013-05-04 LAB — TROPONIN I
Troponin I: <0.3 ng/mL (ref <0.30)
Troponin I: <0.3 ng/mL (ref <0.30)

## 2013-05-04 LAB — RETICULOCYTES
RBC.: 2.69 MIL/uL — ABNORMAL LOW (ref 4.22–5.81)
Retic Count, Absolute: 21.5 10*3/uL (ref 19.0–186.0)
Retic Ct Pct: 0.8 % (ref 0.4–3.1)

## 2013-05-04 LAB — CBC
MCV: 89.3 fL (ref 78.0–100.0)
Platelets: 169 10*3/uL (ref 150–400)
RBC: 3.18 MIL/uL — ABNORMAL LOW (ref 4.22–5.81)
RDW: 12.9 % (ref 11.5–15.5)
WBC: 6 10*3/uL (ref 4.0–10.5)

## 2013-05-04 LAB — RAPID URINE DRUG SCREEN, HOSP PERFORMED
Amphetamines: NOT DETECTED
Benzodiazepines: NOT DETECTED
Opiates: NOT DETECTED
Tetrahydrocannabinol: NOT DETECTED

## 2013-05-04 LAB — BASIC METABOLIC PANEL
Calcium: 8.6 mg/dL (ref 8.4–10.5)
Creatinine, Ser: 1.11 mg/dL (ref 0.50–1.35)
GFR calc Af Amer: 70 mL/min — ABNORMAL LOW (ref >90)
GFR calc non Af Amer: 61 mL/min — ABNORMAL LOW (ref >90)
Glucose, Bld: 93 mg/dL (ref 70–99)
Potassium: 4.1 mEq/L (ref 3.5–5.1)
Sodium: 133 mEq/L — ABNORMAL LOW (ref 135–145)

## 2013-05-04 LAB — FOLATE: Folate: 18.7 ng/mL

## 2013-05-04 LAB — PHOSPHORUS: Phosphorus: 2.9 mg/dL (ref 2.3–4.6)

## 2013-05-04 LAB — MAGNESIUM: Magnesium: 1.7 mg/dL (ref 1.5–2.5)

## 2013-05-04 LAB — TSH: TSH: 4.621 u[IU]/mL — ABNORMAL HIGH (ref 0.350–4.500)

## 2013-05-04 LAB — IRON AND TIBC: Iron: 66 ug/dL (ref 42–135)

## 2013-05-04 MED ORDER — ATENOLOL 25 MG PO TABS
25.0000 mg | ORAL_TABLET | Freq: Every day | ORAL | Status: DC
  Administered 2013-05-04: 25 mg via ORAL
  Filled 2013-05-04: qty 1

## 2013-05-04 MED ORDER — SODIUM CHLORIDE 0.9 % IV SOLN
INTRAVENOUS | Status: DC
  Administered 2013-05-04: 1000 mL via INTRAVENOUS

## 2013-05-04 MED ORDER — ONDANSETRON HCL 4 MG/2ML IJ SOLN: 4.0000 mg | Freq: Four times a day (QID) | INTRAMUSCULAR | Status: DC | PRN

## 2013-05-04 MED ORDER — ENOXAPARIN SODIUM 40 MG/0.4ML ~~LOC~~ SOLN
40.0000 mg | SUBCUTANEOUS | Status: DC
  Administered 2013-05-04: 40 mg via SUBCUTANEOUS
  Filled 2013-05-04: qty 0.4

## 2013-05-04 MED ORDER — HYDROCODONE-ACETAMINOPHEN 5-325 MG PO TABS: 1.0000 | ORAL_TABLET | ORAL | Status: DC | PRN

## 2013-05-04 MED ORDER — INFLUENZA VAC SPLIT QUAD 0.5 ML IM SUSP
0.5000 mL | Freq: Once | INTRAMUSCULAR | Status: AC
  Administered 2013-05-04: 16:00:00 0.5 mL via INTRAMUSCULAR
  Filled 2013-05-04 (×2): qty 0.5

## 2013-05-04 MED ORDER — LORAZEPAM 2 MG/ML IJ SOLN: 1.0000 mg | Freq: Four times a day (QID) | INTRAMUSCULAR | Status: DC | PRN

## 2013-05-04 MED ORDER — SODIUM CHLORIDE 0.9 % IJ SOLN
3.0000 mL | Freq: Two times a day (BID) | INTRAMUSCULAR | Status: DC
  Administered 2013-05-04: 02:00:00 3 mL via INTRAVENOUS

## 2013-05-04 MED ORDER — INFLUENZA VAC SPLIT QUAD 0.5 ML IM SUSP: 0.5000 mL | INTRAMUSCULAR | Status: DC

## 2013-05-04 MED ORDER — LEVETIRACETAM 500 MG PO TABS
500.0000 mg | ORAL_TABLET | Freq: Two times a day (BID) | ORAL | Status: DC
  Administered 2013-05-04: 500 mg via ORAL
  Filled 2013-05-04 (×2): qty 1

## 2013-05-04 MED ORDER — LEVETIRACETAM 500 MG PO TABS: 500.0000 mg | ORAL_TABLET | Freq: Two times a day (BID) | ORAL | Status: DC

## 2013-05-04 MED ORDER — AMOXICILLIN-POT CLAVULANATE 875-125 MG PO TABS: 1.0000 | ORAL_TABLET | Freq: Two times a day (BID) | ORAL | Status: DC

## 2013-05-04 MED ORDER — ONDANSETRON HCL 4 MG PO TABS: 4.0000 mg | ORAL_TABLET | Freq: Four times a day (QID) | ORAL | Status: DC | PRN

## 2013-05-04 MED ORDER — PHENYTOIN SODIUM EXTENDED 100 MG PO CAPS
200.0000 mg | ORAL_CAPSULE | Freq: Every day | ORAL | Status: DC
  Filled 2013-05-04 (×2): qty 2

## 2013-05-04 MED ORDER — SODIUM CHLORIDE 0.9 % IV SOLN
200.0000 mg | Freq: Once | INTRAVENOUS | Status: AC
  Administered 2013-05-04: 04:00:00 200 mg via INTRAVENOUS
  Filled 2013-05-04: qty 4

## 2013-05-04 NOTE — H&P (Addendum)
[facility] History and Physical  Carlos Shields ZOX:[id number] DOB: December 26, 1930 DOA: 05/03/2013  Referring physician: ED physician PCP: Eino Farber, MD   Chief Complaint: seizures   HPI:  79 year old male with history of seizures on dilantin at home presented to [facility] Ed after family found him on the ground at home with noticeable generalized tonic clonic activity. Pt has had additional seizure episode in [facility] ED and was given Dilantin and Ativan. Pt is post ictal and unable to provide history. No reported fevers, chills, no chest pain or shortness of breath, no reported abdominal or urinary concerns. [facility] asked to admit for further evaluation and management and tele bed requested.   Assessment and Plan:  Principal Problem:   Seizures - on dilantin at home, unclear triggering event - pt apparently compliant - will admit to telemetry bed and will continue dilantin - will check UA, TSH, CE, 12 lead EKG, UDS - will place on ativan as needed as well - consider neurology consult in AM Active Problems:   HTN (hypertension) - reasonable on admission - continue home medical regimen - monitor vitals on telemetry    Anemia - no signs of acute bleed - monitor CBC daily, check anemia panel and FOBT as last HG in 01/2013 was ~11 - place on Lovenox for DVT prophylaxis   Code Status: Full Family Communication: No family at bedside Disposition Plan: Admit to telemetry bed    Review of Systems:  Pt post ictal unable to provide history     Past Medical History  Diagnosis Date  . Hypertension   . Seizures   . Pancreatitis     Past Surgical History  Procedure Laterality Date  . Hernia repair      Social History:  reports that he has been smoking Cigars.  He has never used smokeless tobacco. He reports that he does not drink alcohol or use illicit drugs.  No Known Allergies  Family History  Problem Relation Age of Onset  . Hypertension Other   . Diabetes Other   .  Asthma Other     Prior to Admission medications   Medication Sig Start Date End Date Taking? Authorizing Provider  atenolol (TENORMIN) 25 MG tablet Take 25 mg by mouth daily.   Yes Historical Provider, MD  phenytoin (DILANTIN) 100 MG ER capsule Take 200 mg by mouth at bedtime.   Yes Historical Provider, MD  iron polysaccharides (NIFEREX) 150 MG capsule Take 150 mg by mouth daily. 12/09/11 12/08/12  Cristal Ford, MD    Physical Exam: Filed Vitals:   05/03/13 2255 05/03/13 2311 05/03/13 2329 05/03/13 2331  BP:   144/61   Pulse: 57 58 56   Temp:      TempSrc:      Resp: 17 14 24    SpO2: 100% 98% 93% 100%    Physical Exam  Constitutional: Appears stable and not in distress  HENT: Normocephalic. External right and left ear normal.Dry MMt.  Eyes: Conjunctivae and EOM are normal. PERRLA, no scleral icterus.  Neck: Normal ROM. Neck supple. No JVD. No tracheal deviation. No thyromegaly.  CVS: Regular rhythm, bradycardic, S1/S2 +, no murmurs, no gallops, no carotid bruit.  Pulmonary: Effort and breath sounds normal, no stridor, rhonchi, diminished breath sounds at bases  Abdominal: Soft. BS +,  no distension, tenderness, rebound or guarding.  Musculoskeletal: Normal range of motion. No edema and no tenderness.  Lymphadenopathy: No lymphadenopathy noted, cervical, inguinal. Neuro: Somnolent and post ictal, does not follow commands  but arouses with sternal rub  Skin: Skin is warm and dry. No rash noted. Not diaphoretic. No erythema. No pallor.  Psychiatric: Unable to assess due to AMS   Labs on Admission:  Basic Metabolic Panel:  Recent Labs Lab 05/03/13 1835  NA 135  K 4.1  CL 103  CO2 21  GLUCOSE 95  BUN 22  CREATININE 1.22  CALCIUM 8.6   CBC:  Recent Labs Lab 05/03/13 1835  WBC 5.1  NEUTROABS 2.8  HGB 8.9*  HCT 26.2*  MCV 90.0  PLT 172   Radiological Exams on Admission: Ct Head Wo Contrast  05/03/2013   Atrophy and chronic microvascular ischemia. No acute  intracranial abnormality.  Severe chronic sinusitis.  Dental disease.    EKG: Normal sinus rhythm, no ST/T wave changes  Debbora Presto, MD  [facility] Pager [phone]  If 7PM-7AM, please contact night-coverage [email or web address] Password [email or web address] 05/04/2013, 12:23 AM

## 2013-05-04 NOTE — ED Notes (Signed)
Patient incontinent of urine Peri care provided, new brief applied, new gown applied, and linens changed

## 2013-05-04 NOTE — Care Management Note (Signed)
    Page 1 of 1   05/04/2013     1:33:37 PM   CARE MANAGEMENT NOTE 05/04/2013  Patient:  Carlos Shields, Carlos Shields   Account Number:  [id number]  Date Initiated:  05/04/2013  Documentation initiated by:  Lanier Clam  Subjective/Objective Assessment:   79 Y/O M ADMITTED W/SEIZURE.     Action/Plan:   FROM HOME.HAS PCP,PHARMACY.   Anticipated DC Date:  05/04/2013   Anticipated DC Plan:  HOME/SELF CARE      DC Planning Services  CM consult      Choice offered to / List presented to:             Status of service:  Completed, signed off Medicare Important Message given?   (If response is "NO", the following Medicare IM given date fields will be blank) Date Medicare IM given:   Date Additional Medicare IM given:    Discharge Disposition:  HOME/SELF CARE  Per UR Regulation:  Reviewed for med. necessity/level of care/duration of stay  If discussed at Long Length of Stay Meetings, dates discussed:    Comments:  05/04/13 Ethel Veronica RN,BSN NCM [phone] NO D/C NEEDS OR ORDERS.

## 2013-05-04 NOTE — Progress Notes (Signed)
Pt very lethargic when received from ED.

## 2013-05-04 NOTE — Progress Notes (Signed)
[phone] Session- daughter of patient

## 2013-05-04 NOTE — Progress Notes (Signed)
Paged admitting service. NP, Shorr notified of arrival from ED.

## 2013-05-04 NOTE — ED Notes (Signed)
Report given to Boneta Lucks, [place] [phone] All questions answered

## 2013-05-04 NOTE — Discharge Summary (Signed)
Physician Discharge Summary  Carlos Shields YNW:[id number] DOB: 1930-08-30 DOA: 05/03/2013  PCP: Eino Farber, MD  Admit date: 05/03/2013 Discharge date: 05/04/2013  Time spent: >35 minutes  Recommendations for Outpatient Follow-up:  F/u with neurologist outpatient in 2-3 weeks F/u with PCP 1-2 weeks as needed  Discharge Diagnoses:  Principal Problem:   Seizures Active Problems:   HTN (hypertension)   Anemia   Discharge Condition: stable   Diet recommendation: heart healthy   Filed Weights   05/04/13 0143  Weight: 54.1 kg (119 lb 4.3 oz)    History of present illness/Hospital Course:  79 y/o male with dementia, epilepsy, HTN presented with one seizure episode; CT head showed brain atrophy, neuro exam no focal; non new seizures in the hospital;  -seizure with normal dilantin level; d/w patient about increasing dilantin vs starting keppra; patient was having more somnolence, confusion with dilantin; they preferred to keppra BID, stop dilantin; outpatient neurology follow up;  -sinusitis; no s/s of systemic infection; outpatient po atx;     Procedures:  CT head:  (i.e. Studies not automatically included, echos, thoracentesis, etc; not x-rays)  Consultations:  None   Discharge Exam: Filed Vitals:   05/04/13 1100  BP:   Pulse:   Temp: 98.1 F (36.7 C)  Resp:     General: alert Cardiovascular: s1,s2 Respiratory: CTA BL  Discharge Instructions  Discharge Orders   Future Orders Complete By Expires   Diet - low sodium heart healthy  As directed    Discharge instructions  As directed    Comments:     Please follow up with primary care doctor in 1-2 weeks   Increase activity slowly  As directed        Medication List    STOP taking these medications       phenytoin 100 MG ER capsule  Commonly known as:  DILANTIN      TAKE these medications       amoxicillin-clavulanate 875-125 MG per tablet  Commonly known as:  AUGMENTIN  Take 1 tablet by  mouth 2 (two) times daily.     atenolol 25 MG tablet  Commonly known as:  TENORMIN  Take 25 mg by mouth daily.     iron polysaccharides 150 MG capsule  Commonly known as:  NIFEREX  Take 150 mg by mouth daily.     levETIRAcetam 500 MG tablet  Commonly known as:  KEPPRA  Take 1 tablet (500 mg total) by mouth 2 (two) times daily.       No Known Allergies     Follow-up Information   Follow up with KILPATRICK JR,GEORGE R, MD In 1 week.   Specialty:  Pulmonary Disease   Contact information:   [address] [place] [address]        The results of significant diagnostics from this hospitalization (including imaging, microbiology, ancillary and laboratory) are listed below for reference.    Significant Diagnostic Studies: Ct Head Wo Contrast  05/03/2013   CLINICAL DATA:  Seizure  EXAM: CT HEAD WITHOUT CONTRAST  TECHNIQUE: Contiguous axial images were obtained from the base of the skull through the vertex without intravenous contrast.  COMPARISON:  CT 01/13/2012.  FINDINGS: Moderate to advanced atrophy. Chronic microvascular ischemic change in the white matter. Chronic ischemia in the thalami and basal ganglia bilaterally.  Negative for acute infarct, hemorrhage, mass.  Complete opacification of the paranasal sinuses bilaterally. Periapical lucency around upper molars bilaterally compatible with dental infection.  IMPRESSION: Atrophy and  chronic microvascular ischemia. No acute intracranial abnormality.  Severe chronic sinusitis.  Dental disease.   Electronically Signed   By: [name] M.D.   On: 05/03/2013 21:56   Portable Chest 1 View  05/04/2013   CLINICAL DATA:  Seizures. History of seizures, hypertension and pancreatitis.  EXAM: PORTABLE CHEST - 1 VIEW  COMPARISON:  01/13/2012.  FINDINGS: 0510 hr. There are low lung volumes with mild bibasilar atelectasis. No confluent airspace opacity, pleural effusion or pneumothorax is present. The heart size and mediastinal  contours are stable. No acute osseous findings are seen.  IMPRESSION: Mild bibasilar atelectasis. No acute findings identified.   Electronically Signed   By: [name] M.D.   On: 05/04/2013 07:07    Microbiology: No results found for this or any previous visit (from the past 240 hour(s)).   Labs: Basic Metabolic Panel:  Recent Labs Lab 05/03/13 1835 05/04/13 0155  NA 135 133*  K 4.1 4.1  CL 103 101  CO2 21 23  GLUCOSE 95 93  BUN 22 17  CREATININE 1.22 1.11  CALCIUM 8.6 8.6  MG  --  1.7  PHOS  --  2.9   Liver Function Tests: No results found for this basename: AST, ALT, ALKPHOS, BILITOT, PROT, ALBUMIN,  in the last 168 hours No results found for this basename: LIPASE, AMYLASE,  in the last 168 hours No results found for this basename: AMMONIA,  in the last 168 hours CBC:  Recent Labs Lab 05/03/13 1835 05/04/13 0155  WBC 5.1 6.0  NEUTROABS 2.8  --   HGB 8.9* 9.7*  HCT 26.2* 28.4*  MCV 90.0 89.3  PLT 172 169   Cardiac Enzymes:  Recent Labs Lab 05/04/13 0155 05/04/13 0755  TROPONINI <0.30 <0.30   BNP: BNP (last 3 results) No results found for this basename: PROBNP,  in the last 8760 hours CBG: No results found for this basename: GLUCAP,  in the last 168 hours     Signed:  [name]  [facility] 05/04/2013, 12:54 PM

## 2013-05-05 LAB — URINE CULTURE
Colony Count: NO GROWTH
Culture: NO GROWTH

## 2013-05-17 ENCOUNTER — Non-Acute Institutional Stay: Payer: Medicare Other | Admitting: Internal Medicine

## 2013-05-17 DIAGNOSIS — D649 Anemia, unspecified: Secondary | ICD-10-CM

## 2013-05-18 DIAGNOSIS — D649 Anemia, unspecified: Secondary | ICD-10-CM | POA: Insufficient documentation

## 2013-05-18 NOTE — Progress Notes (Signed)
PROGRESS NOTE  DATE: 05/17/2013  FACILITY:  [facility]  LEVEL OF CARE: ALF (13)  Acute Visit  CHIEF COMPLAINT:  Manage anemia  HISTORY OF PRESENT ILLNESS: I was requested by the staff to assess the patient regarding above problem(s):  ANEMIA: The anemia has been stable. The patient denies fatigue, melena or hematochezia. No complications from the medications currently being used.  On 05-16-13 Hb 12, mcv 85.  In 5-14 Hb 12.9.  PAST MEDICAL HISTORY : Reviewed.  No changes.  CURRENT MEDICATIONS: Reviewed per [facility]  PHYSICAL EXAMINATION  GENERAL: no acute distress, normal body habitus RESPIRATORY: breathing is even & unlabored, BS CTAB CARDIAC: RRR, no murmur,no extra heart sounds, no edema  LABS/RADIOLOGY: See HPI  ASSESSMENT/PLAN:  Anemia-Hb stable.  CPT CODE: 16109

## 2013-06-21 ENCOUNTER — Non-Acute Institutional Stay: Payer: Medicare Other | Admitting: Internal Medicine

## 2013-06-21 DIAGNOSIS — E119 Type 2 diabetes mellitus without complications: Secondary | ICD-10-CM

## 2013-06-21 DIAGNOSIS — R569 Unspecified convulsions: Secondary | ICD-10-CM

## 2013-06-21 DIAGNOSIS — I10 Essential (primary) hypertension: Secondary | ICD-10-CM

## 2013-06-21 DIAGNOSIS — J309 Allergic rhinitis, unspecified: Secondary | ICD-10-CM

## 2013-06-23 ENCOUNTER — Encounter: Payer: Self-pay | Admitting: Internal Medicine

## 2013-06-23 NOTE — Progress Notes (Signed)
PROGRESS NOTE  DATE: 06-21-13  FACILITY: [facility]  LEVEL OF CARE: Assisted living  Routine Visit  CHIEF COMPLAINT:  Manage seizure disorder, hypertension and allergic rhinitis  HISTORY OF PRESENT ILLNESS:  REASSESSMENT OF ONGOING PROBLEM(S):  HTN: Pt 's HTN remains stable.  Denies CP, sob, DOE, pedal edema, headaches, dizziness or visual disturbances.  No complications from the medications currently being used.  Last BP : 140/96, 122/62, 140/80, 136/68, 138/58.  SEIZURE DISORDER: The patient's seizure disorder remains stable. No complications reported from the medications presently being used. Staff do not report any recent seizure activity.  ALLERGIC RHINITIS: Allergic rhinitis remains stable.  Patient denies ongoing symptoms such as runny nose sneezing or tearing. No complications reported from the current medication(s) being used.  PAST MEDICAL HISTORY : Reviewed.  No changes.  CURRENT MEDICATIONS: Reviewed per [facility]  REVIEW OF SYSTEMS:  GENERAL: no change in appetite, no fatigue, no weight changes, no fever, chills or weakness RESPIRATORY: no cough, SOB, DOE, wheezing, hemoptysis CARDIAC: no chest pain, edema or palpitations GI: no abdominal pain, diarrhea, constipation, heart burn, nausea or vomiting  PHYSICAL EXAMINATION  VS:  T 97.7       P 88      RR 20      BP 138/58     POX %     WT (Lb) 132  GENERAL: no acute distress, normal body habitus EYES: conjunctivae normal, sclerae normal, normal eye lids NECK: supple, trachea midline, no neck masses, no thyroid tenderness, no thyromegaly LYMPHATICS: no LAN in the neck, no supraclavicular LAN RESPIRATORY: breathing is even & unlabored, BS CTAB CARDIAC: RRR, no murmur,no extra heart sounds, no edema GI: abdomen soft, normal BS, no masses, no tenderness, no hepatomegaly, no splenomegaly PSYCHIATRIC: the patient is alert & oriented to person, affect & behavior appropriate  LABS/RADIOLOGY:  11-14 hemoglobin 12,  MCV 85 otherwise CBC normal, CMP normal, hemoglobin A1c 5.9  7/14 FLP nl 5/14 cbc & cmp nl, HbA1c 5.8  2/14 BMP normal 1/14 hemoglobin 12 MCV 87 otherwise CBC normal, glucose 131 otherwise CMP normal 11/13 hemoglobin A1c 5.8  ASSESSMENT/PLAN:  seizure disorder-well controlled. hypertension-stable. allergic rhinitis-well-controlled. diabetes mellitus-diet controlled.  atrial fibrillation-rate controlled. 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 Continue supportive care. osteoarthritis-denies ongoing pain.  CPT CODE: 09811

## 2013-07-25 ENCOUNTER — Other Ambulatory Visit: Payer: Self-pay | Admitting: *Deleted

## 2013-07-25 MED ORDER — HYDROCODONE-ACETAMINOPHEN 5-325 MG PO TABS: ORAL_TABLET | ORAL | Status: DC

## 2013-07-26 ENCOUNTER — Non-Acute Institutional Stay: Payer: Medicare Other | Admitting: Internal Medicine

## 2013-07-26 DIAGNOSIS — M79609 Pain in unspecified limb: Secondary | ICD-10-CM

## 2013-07-28 DIAGNOSIS — M79609 Pain in unspecified limb: Secondary | ICD-10-CM | POA: Insufficient documentation

## 2013-07-28 NOTE — Progress Notes (Signed)
         PROGRESS NOTE  DATE: 07/26/2013  FACILITY:  [facility]  LEVEL OF CARE: ALF (13)  Acute Visit  CHIEF COMPLAINT:  Manage bilateral lower extremity pain  HISTORY OF PRESENT ILLNESS: I was requested by the staff to assess the patient regarding above problem(s):  BLE PAIN: New problem. Patient has been having pain in bilateral lower extremities. He is unable to get up and move around. Symptoms have been present for several days. There are no other associated signs and symptoms. there is no temporal relationship. Patient cannot identify precipitating or alleviating factors. On 07-21-13 bilateral hip x-rays are negative.  PAST MEDICAL HISTORY : Reviewed.  No changes.  CURRENT MEDICATIONS: Reviewed per [facility]  REVIEW OF SYSTEMS:  GENERAL: no change in appetite, no fatigue, no weight changes, no fever, chills or weakness RESPIRATORY: no cough, SOB, DOE,, wheezing, hemoptysis CARDIAC: no chest pain, edema or palpitations GI: no abdominal pain, diarrhea, constipation, heart burn, nausea or vomiting  PHYSICAL EXAMINATION  VS:  T  97.1       P 80        RR 20       BP 138/70       GENERAL: no acute distress, normal body habitus NECK: supple, trachea midline, no neck masses, no thyroid tenderness, no thyromegaly RESPIRATORY: breathing is even & unlabored, BS CTAB CARDIAC: RRR, no murmur,no extra heart sounds, no edema GI: abdomen soft, normal BS, no masses, no tenderness, no hepatomegaly, no splenomegaly MUSCULOSKELETAL: no Pain  on palpation in both lower extremities , no hip joint tenderness bilaterally   LABS/RADIOLOGY: see HPI   ASSESSMENT/PLAN:  Bilateral lower extremity  pain-new problem. Etiology unclear. Stop Novacor 5/325 every 6 when necessary for 1 week  CPT CODE: 27782

## 2013-07-30 ENCOUNTER — Encounter: Payer: Self-pay | Admitting: *Deleted

## 2013-07-30 ENCOUNTER — Encounter: Payer: Self-pay | Admitting: Family

## 2013-07-30 ENCOUNTER — Non-Acute Institutional Stay (SKILLED_NURSING_FACILITY): Payer: Medicare Other | Admitting: Family

## 2013-07-30 DIAGNOSIS — I10 Essential (primary) hypertension: Secondary | ICD-10-CM

## 2013-07-30 DIAGNOSIS — M1611 Unilateral primary osteoarthritis, right hip: Secondary | ICD-10-CM

## 2013-07-30 DIAGNOSIS — E119 Type 2 diabetes mellitus without complications: Secondary | ICD-10-CM

## 2013-07-30 DIAGNOSIS — G40909 Epilepsy, unspecified, not intractable, without status epilepticus: Secondary | ICD-10-CM

## 2013-07-30 DIAGNOSIS — M79609 Pain in unspecified limb: Secondary | ICD-10-CM

## 2013-07-30 DIAGNOSIS — M169 Osteoarthritis of hip, unspecified: Secondary | ICD-10-CM

## 2013-07-30 DIAGNOSIS — J309 Allergic rhinitis, unspecified: Secondary | ICD-10-CM

## 2013-07-30 DIAGNOSIS — M1610 Unilateral primary osteoarthritis, unspecified hip: Secondary | ICD-10-CM

## 2013-07-30 NOTE — Progress Notes (Signed)
Patient ID: Ronald Sutton, male   DOB: [birth year], 80 y.o.   MRN: [id number]  Date: 07/30/13 Facility: [facility] Code Status:  Full Code  Chief Complaint  Patient presents with  . Acute Visit    R Hip Pain  . Medical Managment of Chronic Issues    Routine Visit     HPI: Pt reports R hip pain x 9 days s/p fall.  Pt denies receiving relief with oral analgesics. Pt denies crepitus, erythema, or bony malformations at affected site.  Pt reports pain is aggravated with movement.  Pt denies N, V, Fever, chills; Health care team denies rash,  Shingles, or skin irritation at affected site.  No further issues/concerns expressed at present time.     No Known Allergies   Medication List       This list is accurate as of: 07/30/13  3:47 PM.  Always use your most recent med list.               amLODipine-benazepril 10-20 MG per capsule  Commonly known as:  LOTREL  Take 1 capsule by mouth daily.     aspirin 325 MG EC tablet  Take 325 mg by mouth daily.     cetirizine 10 MG tablet  Commonly known as:  ZYRTEC  Take 10 mg by mouth daily.     fluticasone 50 MCG/ACT nasal spray  Commonly known as:  FLONASE  Place 1 spray into both nostrils daily. For allergies     HYDROcodone-acetaminophen 5-325 MG per tablet  Commonly known as:  NORCO/VICODIN  Take 1 tablet by mouth every 4 (four) hours as needed for severe pain. Give 1 tablet every 4 hours for 7 days for severe pain, then give 1 tablet every 6 hours for 7 days, then give 1 tablet every 8 hours for 7 days, then give 1 tablet twice daily as needed for severe pain.     levETIRAcetam 500 MG tablet  Commonly known as:  KEPPRA  Take 500 mg by mouth 2 (two) times daily.     MULTI VITAMIN MENS tablet  Take 1 tablet by mouth daily.     nitroGLYCERIN 0.4 MG SL tablet  Commonly known as:  NITROSTAT  Place 0.4 mg under the tongue every 5 (five) minutes as needed for chest pain.     sennosides-docusate sodium 8.6-50 MG tablet  Commonly  known as:  SENOKOT-S  Take 1 tablet by mouth 2 (two) times daily.     VOLTAREN 1 % Gel  Generic drug:  diclofenac sodium  Apply topically 4 (four) times daily. For right hip pain        DATA REVIEWED   Laboratory Studies: 05/16/13-WBC 5.7, Hemoglobin 12.0, Hematocrit 36.5, Platelets 254, BUN 18, Creatinine 1.03, Na 135, K 4.8, Cl 95, Ca 9.4     Past Medical History  Diagnosis Date  . Hypertension   . Diabetes mellitus without complication   . CVA (cerebral infarction)     Right-sided hemiparesis  . Seizures   . A-fib   . Anemia   . Arthritis     osteoarthritis    Review of Systems  Constitutional: Negative.   Eyes: Negative.   Respiratory: Negative.   Cardiovascular: Negative.   Gastrointestinal: Negative.   Genitourinary: Negative.   Musculoskeletal: Positive for joint pain.  Skin: Negative.   Neurological:       H/o falls  Endo/Heme/Allergies: Negative.   Psychiatric/Behavioral: Negative.      Physical Exam Filed Vitals:  07/30/13 1541  BP: 130/58  Pulse: 94  Temp: 97 F (36.1 C)  Resp: 18   Physical Exam  Constitutional: He is oriented to person, place, and time.  Cardiovascular: Normal rate and regular rhythm.   Pulmonary/Chest: Effort normal and breath sounds normal.  Musculoskeletal:       Right hip: He exhibits decreased range of motion, decreased strength and tenderness. He exhibits no bony tenderness, no swelling, no crepitus, no deformity and no laceration.  Neurological: He is alert and oriented to person, place, and time.  R hemiparesis  Skin: Skin is warm and dry.    ASSESSMENT/PLAN 1) Osteoarthritis will start - Voltaren gel 0.1% qid to R hip 2)OA- Norco 5/325 q 4 hours x 7 days, then q 6 hours x 7 days, then 8 hours x 7 days, then bid prn. 3)Diabetes Mellitus-will continue treatment regimen; pt A1C 5.9 4)Allergic Rhinitis-will cont. Zyrtec ; pt is stable  5: HTN- will cont. Current treatment plan pt stable 6)CVA- continue  supportive care; pt is stable 7) Seizure Disorder-will cont current tx plan; pt stable  Follow up:prn

## 2013-07-31 ENCOUNTER — Other Ambulatory Visit: Payer: Self-pay | Admitting: *Deleted

## 2013-07-31 MED ORDER — HYDROCODONE-ACETAMINOPHEN 5-325 MG PO TABS: ORAL_TABLET | ORAL | Status: DC

## 2013-08-05 ENCOUNTER — Inpatient Hospital Stay (HOSPITAL_COMMUNITY)
Admission: EM | Admit: 2013-08-05 | Discharge: 2013-08-08 | DRG: 481 | Disposition: A | Discharge status: Skilled Nursing Facility | Payer: Medicare Other | Attending: Internal Medicine | Admitting: Internal Medicine

## 2013-08-05 ENCOUNTER — Emergency Department (HOSPITAL_COMMUNITY): Payer: Medicare Other

## 2013-08-05 ENCOUNTER — Inpatient Hospital Stay (HOSPITAL_COMMUNITY): Payer: Medicare Other

## 2013-08-05 ENCOUNTER — Encounter (HOSPITAL_COMMUNITY): Payer: Self-pay | Admitting: Emergency Medicine

## 2013-08-05 DIAGNOSIS — I10 Essential (primary) hypertension: Secondary | ICD-10-CM | POA: Diagnosis present

## 2013-08-05 DIAGNOSIS — S72009A Fracture of unspecified part of neck of unspecified femur, initial encounter for closed fracture: Principal | ICD-10-CM | POA: Diagnosis present

## 2013-08-05 DIAGNOSIS — S72001A Fracture of unspecified part of neck of right femur, initial encounter for closed fracture: Secondary | ICD-10-CM | POA: Diagnosis present

## 2013-08-05 DIAGNOSIS — Z9089 Acquired absence of other organs: Secondary | ICD-10-CM

## 2013-08-05 DIAGNOSIS — W06XXXA Fall from bed, initial encounter: Secondary | ICD-10-CM | POA: Diagnosis present

## 2013-08-05 DIAGNOSIS — G40909 Epilepsy, unspecified, not intractable, without status epilepticus: Secondary | ICD-10-CM | POA: Diagnosis present

## 2013-08-05 DIAGNOSIS — G8190 Hemiplegia, unspecified affecting unspecified side: Secondary | ICD-10-CM | POA: Diagnosis present

## 2013-08-05 DIAGNOSIS — Z7982 Long term (current) use of aspirin: Secondary | ICD-10-CM

## 2013-08-05 DIAGNOSIS — E875 Hyperkalemia: Secondary | ICD-10-CM | POA: Diagnosis present

## 2013-08-05 DIAGNOSIS — Z9181 History of falling: Secondary | ICD-10-CM

## 2013-08-05 DIAGNOSIS — F0390 Unspecified dementia without behavioral disturbance: Secondary | ICD-10-CM | POA: Diagnosis present

## 2013-08-05 DIAGNOSIS — Z85819 Personal history of malignant neoplasm of unspecified site of lip, oral cavity, and pharynx: Secondary | ICD-10-CM

## 2013-08-05 DIAGNOSIS — E119 Type 2 diabetes mellitus without complications: Secondary | ICD-10-CM | POA: Diagnosis present

## 2013-08-05 DIAGNOSIS — M1990 Unspecified osteoarthritis, unspecified site: Secondary | ICD-10-CM | POA: Diagnosis present

## 2013-08-05 DIAGNOSIS — I4891 Unspecified atrial fibrillation: Secondary | ICD-10-CM | POA: Diagnosis present

## 2013-08-05 DIAGNOSIS — Y9210 Unspecified residential institution as the place of occurrence of the external cause: Secondary | ICD-10-CM | POA: Diagnosis present

## 2013-08-05 DIAGNOSIS — Z8673 Personal history of transient ischemic attack (TIA), and cerebral infarction without residual deficits: Secondary | ICD-10-CM

## 2013-08-05 DIAGNOSIS — Z87891 Personal history of nicotine dependence: Secondary | ICD-10-CM

## 2013-08-05 DIAGNOSIS — Z79899 Other long term (current) drug therapy: Secondary | ICD-10-CM

## 2013-08-05 HISTORY — DX: Malignant (primary) neoplasm, unspecified: C80.1

## 2013-08-05 LAB — PROTIME-INR
INR: 0.98 (ref 0.00–1.49)
Prothrombin Time: 12.8 seconds (ref 11.6–15.2)

## 2013-08-05 LAB — SURGICAL PCR SCREEN
MRSA, PCR: POSITIVE — AB
STAPHYLOCOCCUS AUREUS: POSITIVE — AB

## 2013-08-05 LAB — BASIC METABOLIC PANEL
BUN: 15 mg/dL (ref 6–23)
CO2: 29 mEq/L (ref 19–32)
Calcium: 9.5 mg/dL (ref 8.4–10.5)
Chloride: 94 mEq/L — ABNORMAL LOW (ref 96–112)
Creatinine, Ser: 0.88 mg/dL (ref 0.50–1.35)
GFR calc Af Amer: >90 mL/min (ref >90)
GFR, EST NON AFRICAN AMERICAN: 79 mL/min — AB (ref >90)
GLUCOSE: 110 mg/dL — AB (ref 70–99)
POTASSIUM: 4.9 meq/L (ref 3.7–5.3)
SODIUM: 132 meq/L — AB (ref 137–147)

## 2013-08-05 LAB — HEMOGLOBIN A1C
Hgb A1c MFr Bld: 5.7 % — ABNORMAL HIGH (ref <5.7)
MEAN PLASMA GLUCOSE: 117 mg/dL — AB (ref <117)

## 2013-08-05 LAB — GLUCOSE, CAPILLARY
Glucose-Capillary: 113 mg/dL — ABNORMAL HIGH (ref 70–99)
Glucose-Capillary: 113 mg/dL — ABNORMAL HIGH (ref 70–99)

## 2013-08-05 LAB — CBC WITH DIFFERENTIAL/PLATELET
Basophils Absolute: 0.1 10*3/uL (ref 0.0–0.1)
Basophils Relative: 1 % (ref 0–1)
EOS ABS: 0.4 10*3/uL (ref 0.0–0.7)
Eosinophils Relative: 5 % (ref 0–5)
HCT: 39 % (ref 39.0–52.0)
HEMOGLOBIN: 12.9 g/dL — AB (ref 13.0–17.0)
LYMPHS ABS: 1.4 10*3/uL (ref 0.7–4.0)
Lymphocytes Relative: 16 % (ref 12–46)
MCH: 28.7 pg (ref 26.0–34.0)
MCHC: 33.1 g/dL (ref 30.0–36.0)
MCV: 86.9 fL (ref 78.0–100.0)
MONOS PCT: 6 % (ref 3–12)
Monocytes Absolute: 0.5 10*3/uL (ref 0.1–1.0)
NEUTROS ABS: 6.5 10*3/uL (ref 1.7–7.7)
NEUTROS PCT: 73 % (ref 43–77)
Platelets: 286 10*3/uL (ref 150–400)
RBC: 4.49 MIL/uL (ref 4.22–5.81)
RDW: 11.7 % (ref 11.5–15.5)
WBC: 8.9 10*3/uL (ref 4.0–10.5)

## 2013-08-05 LAB — URINALYSIS, ROUTINE W REFLEX MICROSCOPIC
Bilirubin Urine: NEGATIVE
GLUCOSE, UA: NEGATIVE mg/dL
HGB URINE DIPSTICK: NEGATIVE
Ketones, ur: NEGATIVE mg/dL
LEUKOCYTES UA: NEGATIVE
Nitrite: NEGATIVE
Protein, ur: NEGATIVE mg/dL
SPECIFIC GRAVITY, URINE: 1.013 (ref 1.005–1.030)
Urobilinogen, UA: 0.2 mg/dL (ref 0.0–1.0)
pH: 7.5 (ref 5.0–8.0)

## 2013-08-05 LAB — TYPE AND SCREEN
ABO/RH(D): AB POS
ANTIBODY SCREEN: NEGATIVE

## 2013-08-05 LAB — ABO/RH: ABO/RH(D): AB POS

## 2013-08-05 MED ORDER — HYDROCODONE-ACETAMINOPHEN 5-325 MG PO TABS: 1.0000 | ORAL_TABLET | Freq: Four times a day (QID) | ORAL | Status: DC | PRN

## 2013-08-05 MED ORDER — AMLODIPINE BESYLATE 10 MG PO TABS
10.0000 mg | ORAL_TABLET | Freq: Every day | ORAL | Status: DC
Start: 2013-08-05 — End: 2013-08-08
  Administered 2013-08-07 – 2013-08-08 (×2): 10 mg via ORAL
  Filled 2013-08-05 (×4): qty 1

## 2013-08-05 MED ORDER — CHLORHEXIDINE GLUCONATE CLOTH 2 % EX PADS
6.0000 | MEDICATED_PAD | Freq: Every day | CUTANEOUS | Status: DC
  Administered 2013-08-07 – 2013-08-08 (×2): 6 via TOPICAL

## 2013-08-05 MED ORDER — FENTANYL CITRATE 0.05 MG/ML IJ SOLN
50.0000 ug | INTRAMUSCULAR | Status: DC | PRN
  Administered 2013-08-05: 50 ug via INTRAVENOUS
  Filled 2013-08-05: qty 2

## 2013-08-05 MED ORDER — SENNA-DOCUSATE SODIUM 8.6-50 MG PO TABS: 1.0000 | ORAL_TABLET | Freq: Two times a day (BID) | ORAL | Status: DC

## 2013-08-05 MED ORDER — SENNA 8.6 MG PO TABS
1.0000 | ORAL_TABLET | Freq: Two times a day (BID) | ORAL | Status: DC
Start: 2013-08-05 — End: 2013-08-08
  Administered 2013-08-06 – 2013-08-08 (×4): 8.6 mg via ORAL

## 2013-08-05 MED ORDER — BENAZEPRIL HCL 20 MG PO TABS
20.0000 mg | ORAL_TABLET | Freq: Every day | ORAL | Status: DC
Start: 2013-08-05 — End: 2013-08-08
  Administered 2013-08-07 – 2013-08-08 (×2): 20 mg via ORAL
  Filled 2013-08-05 (×4): qty 1

## 2013-08-05 MED ORDER — ONDANSETRON HCL 4 MG/2ML IJ SOLN
4.0000 mg | Freq: Once | INTRAMUSCULAR | Status: AC
  Administered 2013-08-05: 4 mg via INTRAVENOUS
  Filled 2013-08-05: qty 2

## 2013-08-05 MED ORDER — AMLODIPINE BESY-BENAZEPRIL HCL 10-20 MG PO CAPS: 1.0000 | ORAL_CAPSULE | Freq: Every day | ORAL | Status: DC

## 2013-08-05 MED ORDER — MUPIROCIN 2 % EX OINT
1.0000 "application " | TOPICAL_OINTMENT | Freq: Two times a day (BID) | CUTANEOUS | Status: DC
  Administered 2013-08-06 – 2013-08-08 (×4): 1 via NASAL
  Filled 2013-08-05: qty 22

## 2013-08-05 MED ORDER — ASPIRIN EC 325 MG PO TBEC: 325.0000 mg | DELAYED_RELEASE_TABLET | Freq: Every day | ORAL | Status: DC

## 2013-08-05 MED ORDER — SODIUM CHLORIDE 0.9 % IV SOLN
1000.0000 mL | INTRAVENOUS | Status: DC
  Administered 2013-08-05: 1000 mL via INTRAVENOUS

## 2013-08-05 MED ORDER — LEVETIRACETAM 500 MG PO TABS
500.0000 mg | ORAL_TABLET | Freq: Two times a day (BID) | ORAL | Status: DC
  Administered 2013-08-06 – 2013-08-08 (×4): 500 mg via ORAL
  Filled 2013-08-05 (×7): qty 1

## 2013-08-05 MED ORDER — SODIUM CHLORIDE 0.9 % IV SOLN
INTRAVENOUS | Status: DC
  Administered 2013-08-05 – 2013-08-08 (×3): via INTRAVENOUS

## 2013-08-05 MED ORDER — ASPIRIN EC 325 MG PO TBEC
325.0000 mg | DELAYED_RELEASE_TABLET | Freq: Every day | ORAL | Status: DC
Start: 2013-08-05 — End: 2013-08-06
  Filled 2013-08-05 (×2): qty 1

## 2013-08-05 MED ORDER — INSULIN ASPART 100 UNIT/ML ~~LOC~~ SOLN: 0.0000 [IU] | Freq: Three times a day (TID) | SUBCUTANEOUS | Status: DC

## 2013-08-05 MED ORDER — MORPHINE SULFATE 2 MG/ML IJ SOLN: 0.5000 mg | INTRAMUSCULAR | Status: DC | PRN

## 2013-08-05 NOTE — ED Provider Notes (Signed)
CSN: [id number]     Arrival date & time 08/05/13  0127 History   First MD Initiated Contact with Patient 08/05/13 0325     Chief Complaint  Patient presents with  . Fall   (Consider location/radiation/quality/duration/timing/severity/associated sxs/prior Treatment) HPI History provided by EMS. History of dementia, resides at a nursing home, reportedly fell out of bed when getting up from the bathroom. Per EMS was complaining of some right hip pain. Denies any head or neck injury. Unknown LOC. On arrival to emergency department, patient denies any pain or injury. He is not a reliable historian. Level V caveat applies Past Medical History  Diagnosis Date  . Hypertension   . Diabetes mellitus without complication   . CVA (cerebral infarction)     Right-sided hemiparesis  . Seizures   . A-fib   . Anemia   . Arthritis     osteoarthritis   History reviewed. No pertinent past surgical history. No family history on file. History  Substance Use Topics  . Smoking status: Former Research scientist (life sciences)  . Smokeless tobacco: Not on file  . Alcohol Use: No    Review of Systems  Unable to perform ROS  level V caveat as above  Allergies  Review of patient's allergies indicates no known allergies.  Home Medications   Current Outpatient Rx  Name  Route  Sig  Dispense  Refill  . amLODipine-benazepril (LOTREL) 10-20 MG per capsule   Oral   Take 1 capsule by mouth daily.         Marland Kitchen aspirin 325 MG EC tablet   Oral   Take 325 mg by mouth daily.         . cetirizine (ZYRTEC) 10 MG tablet   Oral   Take 10 mg by mouth daily.         . fluticasone (FLONASE) 50 MCG/ACT nasal spray   Each Nare   Place 1 spray into both nostrils daily. For allergies         . levETIRAcetam (KEPPRA) 500 MG tablet   Oral   Take 500 mg by mouth 2 (two) times daily.         . Multiple Vitamin (MULTI VITAMIN MENS) tablet   Oral   Take 1 tablet by mouth daily.         . nitroGLYCERIN (NITROSTAT) 0.4 MG SL  tablet   Sublingual   Place 0.4 mg under the tongue every 5 (five) minutes as needed for chest pain.         Marland Kitchen sennosides-docusate sodium (SENOKOT-S) 8.6-50 MG tablet   Oral   Take 1 tablet by mouth 2 (two) times daily.          BP 153/76  Pulse 72  Temp(Src) 97.7 F (36.5 C) (Oral)  Resp 18  SpO2 99% Physical Exam  Constitutional: He is oriented to person, place, and time. He appears well-developed and well-nourished.  HENT:  Head: Normocephalic and atraumatic.  Eyes: EOM are normal. Pupils are equal, round, and reactive to light.  Neck: Neck supple.  No C-spine tenderness or deformity  Cardiovascular: Normal rate, regular rhythm and intact distal pulses.   Pulmonary/Chest: Effort normal and breath sounds normal. No respiratory distress. He exhibits no tenderness.  Abdominal: Soft. There is no tenderness.  Musculoskeletal:  Pelvis is stable. Right lower extremity held in flexion at the knee without obvious shortening or rotation otherwise. Distal neurovascular equal and intact x4. No tenderness over the right hip. No obvious deformity.  Neurological: He  is alert and oriented to person, place, and time.  Skin: Skin is warm and dry.    ED Course  Procedures (including critical care time) Labs Review Labs Reviewed  BASIC METABOLIC PANEL  CBC WITH DIFFERENTIAL  PROTIME-INR  URINALYSIS, ROUTINE W REFLEX MICROSCOPIC  TYPE AND SCREEN   Imaging Review Dg Chest 1 View  08/05/2013   CLINICAL DATA:  Status post fall. Concern for chest injury. History of smoking.  EXAM: CHEST - 1 VIEW  COMPARISON:  Chest radiograph from 03/27/2008  FINDINGS: The lungs are mildly hypoexpanded. Peribronchial thickening is noted. No pleural effusion or pneumothorax is seen. There is mild elevation of the right hemidiaphragm.  The cardiomediastinal silhouette is within normal limits. No acute osseous abnormalities are seen. Clips are noted within the right upper quadrant, reflecting prior  cholecystectomy.  IMPRESSION: Lungs mildly hypoexpanded. Peribronchial thickening noted. Lungs otherwise clear. No displaced rib fractures seen.   Electronically Signed   By: Garald Balding M.D.   On: 08/05/2013 03:56   Dg Hip Complete Right  08/05/2013   CLINICAL DATA:  Unwitnessed fall, with right hip pain.  EXAM: RIGHT HIP - COMPLETE 2+ VIEW  COMPARISON:  Right hip radiographs performed 03/27/2008  FINDINGS: There is a minimally displaced subcapital fracture through the right femoral neck, though it does extend more distally along the lateral aspect of the neck. The right femoral head remains seated at the acetabulum. A chronic osseous fragment is again seen overlying the greater femoral trochanter, likely reflecting remote injury.  No additional fractures are seen. The left hip joint is grossly unremarkable in appearance. The sacroiliac joints are within normal limits. The visualized bowel gas pattern is grossly unremarkable.  IMPRESSION: Minimally displaced subcapital fracture through the right femoral neck; it does extend more distally along the lateral aspect of the neck.   Electronically Signed   By: Garald Balding M.D.   On: 08/05/2013 02:48    Date: 08/05/2013  Rate: 84  Rhythm: normal sinus rhythm  QRS Axis: normal  Intervals: normal  ST/T Wave abnormalities: nonspecific ST changes  Conduction Disutrbances:none  Narrative Interpretation:   Old EKG Reviewed: none available   4:52 AM discussed with nursing home staff - patient does stand and transfer but does not ambulate  Discussed with Dr. Ninfa Linden, orthopedics on call. At this point will need to determine patient's functional status and also discuss with family regarding decision for fracture repair.  MDM  Diagnosis: Right hip fracture, history of dementia, history of stroke  Imaging and labs obtained and reviewed as above MED admit    Teressa Lower, MD 08/05/13 [phone]

## 2013-08-05 NOTE — Consult Note (Signed)
Reason for Consult:  Right hip fracture Referring Physician: ED MD  Ronald Sutton is an 80 y.o. male.  HPI: 80 yo pleasantly demented nursing home resident with a history of multiple falls.  [place] again last evening and was brought by EMS to the [facility] ED.  After complaint of right hip pain, x-rays were obtained showing a non-displaced right femoral neck fracture.  Ortho and [facility] are consulted.  He is awake, but confused.  He follws some commands.  Past Medical History  Diagnosis Date  . Hypertension   . Diabetes mellitus without complication   . CVA (cerebral infarction)     Right-sided hemiparesis  . Seizures   . A-fib   . Anemia   . Arthritis     osteoarthritis  . Cancer     Oral cancer?    Past Surgical History  Procedure Laterality Date  . Cholecystectomy      History reviewed. No pertinent family history.  Social History:  reports that he has quit smoking. He does not have any smokeless tobacco history on file. He reports that he does not drink alcohol or use illicit drugs.  Allergies: No Known Allergies  Medications: I have reviewed the patient's current medications.  Results for orders placed during the hospital encounter of 08/05/13 (from the past 48 hour(s))  BASIC METABOLIC PANEL     Status: Abnormal   Collection Time    08/05/13  4:25 AM      Result Value Range   Sodium 132 (*) 137 - 147 mEq/L   Potassium 4.9  3.7 - 5.3 mEq/L   Chloride 94 (*) 96 - 112 mEq/L   CO2 29  19 - 32 mEq/L   Glucose, Bld 110 (*) 70 - 99 mg/dL   BUN 15  6 - 23 mg/dL   Creatinine, Ser 0.88  0.50 - 1.35 mg/dL   Calcium 9.5  8.4 - 10.5 mg/dL   GFR calc non Af Amer 79 (*) >90 mL/min   GFR calc Af Amer >90  >90 mL/min   Comment: (NOTE)     The eGFR has been calculated using the CKD EPI equation.     This calculation has not been validated in all clinical situations.     eGFR's persistently <90 mL/min signify possible Chronic Kidney     Disease.  CBC WITH DIFFERENTIAL      Status: Abnormal   Collection Time    08/05/13  4:25 AM      Result Value Range   WBC 8.9  4.0 - 10.5 K/uL   RBC 4.49  4.22 - 5.81 MIL/uL   Hemoglobin 12.9 (*) 13.0 - 17.0 g/dL   HCT 39.0  39.0 - 52.0 %   MCV 86.9  78.0 - 100.0 fL   MCH 28.7  26.0 - 34.0 pg   MCHC 33.1  30.0 - 36.0 g/dL   RDW 11.7  11.5 - 15.5 %   Platelets 286  150 - 400 K/uL   Neutrophils Relative % 73  43 - 77 %   Neutro Abs 6.5  1.7 - 7.7 K/uL   Lymphocytes Relative 16  12 - 46 %   Lymphs Abs 1.4  0.7 - 4.0 K/uL   Monocytes Relative 6  3 - 12 %   Monocytes Absolute 0.5  0.1 - 1.0 K/uL   Eosinophils Relative 5  0 - 5 %   Eosinophils Absolute 0.4  0.0 - 0.7 K/uL   Basophils Relative 1  0 -  1 %   Basophils Absolute 0.1  0.0 - 0.1 K/uL  PROTIME-INR     Status: None   Collection Time    08/05/13  4:25 AM      Result Value Range   Prothrombin Time 12.8  11.6 - 15.2 seconds   INR 0.98  0.00 - 1.49  TYPE AND SCREEN     Status: None   Collection Time    08/05/13  4:25 AM      Result Value Range   ABO/RH(D) AB POS     Antibody Screen NEG     Sample Expiration 08/08/2013    ABO/RH     Status: None   Collection Time    08/05/13  4:25 AM      Result Value Range   ABO/RH(D) AB POS    URINALYSIS, ROUTINE W REFLEX MICROSCOPIC     Status: None   Collection Time    08/05/13  5:46 AM      Result Value Range   Color, Urine YELLOW  YELLOW   APPearance CLEAR  CLEAR   Specific Gravity, Urine 1.013  1.005 - 1.030   pH 7.5  5.0 - 8.0   Glucose, UA NEGATIVE  NEGATIVE mg/dL   Hgb urine dipstick NEGATIVE  NEGATIVE   Bilirubin Urine NEGATIVE  NEGATIVE   Ketones, ur NEGATIVE  NEGATIVE mg/dL   Protein, ur NEGATIVE  NEGATIVE mg/dL   Urobilinogen, UA 0.2  0.0 - 1.0 mg/dL   Nitrite NEGATIVE  NEGATIVE   Leukocytes, UA NEGATIVE  NEGATIVE   Comment: MICROSCOPIC NOT DONE ON URINES WITH NEGATIVE PROTEIN, BLOOD, LEUKOCYTES, NITRITE, OR GLUCOSE <1000 mg/dL.    Dg Chest 1 View  08/05/2013   CLINICAL DATA:  Status post fall.  Concern for chest injury. History of smoking.  EXAM: CHEST - 1 VIEW  COMPARISON:  Chest radiograph from 03/27/2008  FINDINGS: The lungs are mildly hypoexpanded. Peribronchial thickening is noted. No pleural effusion or pneumothorax is seen. There is mild elevation of the right hemidiaphragm.  The cardiomediastinal silhouette is within normal limits. No acute osseous abnormalities are seen. Clips are noted within the right upper quadrant, reflecting prior cholecystectomy.  IMPRESSION: Lungs mildly hypoexpanded. Peribronchial thickening noted. Lungs otherwise clear. No displaced rib fractures seen.   Electronically Signed   By: Garald Balding M.D.   On: 08/05/2013 03:56   Dg Hip Complete Right  08/05/2013   CLINICAL DATA:  Unwitnessed fall, with right hip pain.  EXAM: RIGHT HIP - COMPLETE 2+ VIEW  COMPARISON:  Right hip radiographs performed 03/27/2008  FINDINGS: There is a minimally displaced subcapital fracture through the right femoral neck, though it does extend more distally along the lateral aspect of the neck. The right femoral head remains seated at the acetabulum. A chronic osseous fragment is again seen overlying the greater femoral trochanter, likely reflecting remote injury.  No additional fractures are seen. The left hip joint is grossly unremarkable in appearance. The sacroiliac joints are within normal limits. The visualized bowel gas pattern is grossly unremarkable.  IMPRESSION: Minimally displaced subcapital fracture through the right femoral neck; it does extend more distally along the lateral aspect of the neck.   Electronically Signed   By: Garald Balding M.D.   On: 08/05/2013 02:48   Ct Head Wo Contrast  08/05/2013   CLINICAL DATA:  Status post fall, assess for injury. History of head and neck cancer.  EXAM: CT HEAD WITHOUT CONTRAST  TECHNIQUE: Contiguous axial images were obtained from the  base of the skull through the vertex without intravenous contrast.  COMPARISON:  CT of the head  August 06, 2009.  FINDINGS: Moderate to severe ventriculomegaly, likely on the basis of global parenchymal brain volume loss as there is commensurate enlargement of cerebral sulci and cerebellar folia, advanced. No intraparenchymal hemorrhage, mass effect nor midline shift. Confluent supratentorial white matter hypodensities are within normal range for patient's age and though non-specific suggest sequelae of chronic small vessel ischemic disease. No acute large vascular territory infarcts. Remote high right frontal cortical the subcortical infarct, present on prior examination.  No abnormal extra-axial fluid collections. Basal cisterns are patent. Moderate calcific atherosclerosis of the carotid siphons.  No skull fracture. Visualized paranasal sinuses and mastoid aircells are well-aerated. The included ocular globes and orbital contents are non-suspicious.  IMPRESSION: No acute intracranial process.  Moderate to severe global atrophy, unchanged.  Femoral high right frontal cortical/ subcortical infarct versus remote trauma. Severe white matter changes suggest chronic small vessel ischemic disease.   Electronically Signed   By: Elon Alas   On: 08/05/2013 06:34    ROS Blood pressure 152/78, pulse 91, temperature 97.8 F (36.6 C), temperature source Oral, resp. rate 20, SpO2 96.00%. Physical Exam  Musculoskeletal:       Right hip: He exhibits bony tenderness.  There is no obvious deformity of leg length issues and his pain is minimal with hip rotation. His skin over the hip is intact and his foot is well-perfused.  Assessment/Plan: Right hip with non-displaced femoral neck fracture 1)  A fracture such as this would usually require fixation with cannulated screws and non-weight bearing for 4 weeks.  Right now, he does not show much pain at all on exam.  I did wake up his sister when I called her home to inform her that he was at the hospital.  She was unaware and also seemed confused about his  dementia.  Thus, right know I have trouble feeling confident about getting informed consent for surgery.  I recommend him remaining non-weight bearing on his right hip for now and holding off on surgery today until a reliable face-to-face encounter with family can be had.  Once this discussion can be made and appropriately understood, then a decision about surgery can be made.  Makarios Madlock Y 08/05/2013, 9:09 AM

## 2013-08-05 NOTE — H&P (Signed)
[facility] History and Physical  Praneeth Bussey ASN:[id number] DOB: 1930/08/21 DOA: 08/05/2013   PCP: Cyndee Brightly, MD  Specialists: Unknown  Chief Complaint: Fall and pain in the right hip  HPI: Ronald Sutton is a 80 y.o. male with a past medical history dementia, hypertension, seasonal allergies, seizure disorder, who lives in a skilled nursing facility, [place]. Patient has dementia. He is pleasantly confused and unable to provide much history. He does say that his legs hurt, but he was unable to specify which one. He was able to tell me that he fell. He denies any syncopal episode. However, due to his dementia history is not entirely reliable. Most of the history was available from previous notes. It appears that he fell out of the bed when trying to get up to go to the bathroom. There is no history of head or neck injury. No history of any loss of consciousness. Patient was brought in here and he was found to have a fracture in his right hip. Orthopedics has been consulted.  Home Medications: Prior to Admission medications   Medication Sig Start Date End Date Taking? Authorizing Provider  amLODipine-benazepril (LOTREL) 10-20 MG per capsule Take 1 capsule by mouth daily.   Yes Historical Provider, MD  aspirin 325 MG EC tablet Take 325 mg by mouth daily.   Yes Historical Provider, MD  cetirizine (ZYRTEC) 10 MG tablet Take 10 mg by mouth daily.   Yes Historical Provider, MD  fluticasone (FLONASE) 50 MCG/ACT nasal spray Place 1 spray into both nostrils daily. For allergies   Yes Historical Provider, MD  levETIRAcetam (KEPPRA) 500 MG tablet Take 500 mg by mouth 2 (two) times daily.   Yes Historical Provider, MD  Multiple Vitamin (MULTI VITAMIN MENS) tablet Take 1 tablet by mouth daily.   Yes Historical Provider, MD  nitroGLYCERIN (NITROSTAT) 0.4 MG SL tablet Place 0.4 mg under the tongue every 5 (five) minutes as needed for chest pain.   Yes Historical Provider, MD    sennosides-docusate sodium (SENOKOT-S) 8.6-50 MG tablet Take 1 tablet by mouth 2 (two) times daily.   Yes Historical Provider, MD    Allergies: No Known Allergies  Past Medical History: Past Medical History  Diagnosis Date  . Hypertension   . Diabetes mellitus without complication   . CVA (cerebral infarction)     Right-sided hemiparesis  . Seizures   . A-fib   . Anemia   . Arthritis     osteoarthritis  . Cancer     Oral cancer?    Past Surgical History  Procedure Laterality Date  . Cholecystectomy      Social History: Patient lives in a skilled nursing facility. His activity level is unknown. Smoking and Alcohol history is unknown.  Family History:  Unable to obtain due to his dementia  Review of Systems - unable to obtain due to the dementia  Physical Examination  Filed Vitals:   08/05/13 0141 08/05/13 0743  BP: 153/76 152/78  Pulse: 72 91  Temp: 97.7 F (36.5 C) 97.8 F (36.6 C)  TempSrc: Oral Oral  Resp: 18 20  SpO2: 99% 96%    General appearance: alert, cooperative, appears stated age, distracted and no distress Head: Normocephalic, without obvious abnormality, atraumatic Eyes: conjunctivae/corneas clear. PERRL, EOM's intact.  Throat: Slightly dry MM Neck: no adenopathy, no carotid bruit, no JVD, supple, symmetrical, trachea midline and thyroid not enlarged, symmetric, no tenderness/mass/nodules Resp: clear to auscultation bilaterally Cardio: regular rate and rhythm, S1, S2 normal, no  murmur, click, rub or gallop GI: soft, non-tender; bowel sounds normal; no masses,  no organomegaly Extremities: He is moving both his lower extremities. He was lifting up both his legs. Pulses: 2+ and symmetric Lymph nodes: Cervical, supraclavicular, and axillary nodes normal. Neurologic: He is alert. Disoriented. No obvious focal deficits.  Laboratory Data: Results for orders placed during the hospital encounter of 08/05/13 (from the past 48 hour(s))  BASIC METABOLIC  PANEL     Status: Abnormal   Collection Time    08/05/13  4:25 AM      Result Value Range   Sodium 132 (*) 137 - 147 mEq/L   Potassium 4.9  3.7 - 5.3 mEq/L   Chloride 94 (*) 96 - 112 mEq/L   CO2 29  19 - 32 mEq/L   Glucose, Bld 110 (*) 70 - 99 mg/dL   BUN 15  6 - 23 mg/dL   Creatinine, Ser 0.88  0.50 - 1.35 mg/dL   Calcium 9.5  8.4 - 10.5 mg/dL   GFR calc non Af Amer 79 (*) >90 mL/min   GFR calc Af Amer >90  >90 mL/min   Comment: (NOTE)     The eGFR has been calculated using the CKD EPI equation.     This calculation has not been validated in all clinical situations.     eGFR's persistently <90 mL/min signify possible Chronic Kidney     Disease.  CBC WITH DIFFERENTIAL     Status: Abnormal   Collection Time    08/05/13  4:25 AM      Result Value Range   WBC 8.9  4.0 - 10.5 K/uL   RBC 4.49  4.22 - 5.81 MIL/uL   Hemoglobin 12.9 (*) 13.0 - 17.0 g/dL   HCT 39.0  39.0 - 52.0 %   MCV 86.9  78.0 - 100.0 fL   MCH 28.7  26.0 - 34.0 pg   MCHC 33.1  30.0 - 36.0 g/dL   RDW 11.7  11.5 - 15.5 %   Platelets 286  150 - 400 K/uL   Neutrophils Relative % 73  43 - 77 %   Neutro Abs 6.5  1.7 - 7.7 K/uL   Lymphocytes Relative 16  12 - 46 %   Lymphs Abs 1.4  0.7 - 4.0 K/uL   Monocytes Relative 6  3 - 12 %   Monocytes Absolute 0.5  0.1 - 1.0 K/uL   Eosinophils Relative 5  0 - 5 %   Eosinophils Absolute 0.4  0.0 - 0.7 K/uL   Basophils Relative 1  0 - 1 %   Basophils Absolute 0.1  0.0 - 0.1 K/uL  PROTIME-INR     Status: None   Collection Time    08/05/13  4:25 AM      Result Value Range   Prothrombin Time 12.8  11.6 - 15.2 seconds   INR 0.98  0.00 - 1.49  TYPE AND SCREEN     Status: None   Collection Time    08/05/13  4:25 AM      Result Value Range   ABO/RH(D) AB POS     Antibody Screen NEG     Sample Expiration 08/08/2013    ABO/RH     Status: None   Collection Time    08/05/13  4:25 AM      Result Value Range   ABO/RH(D) AB POS    URINALYSIS, ROUTINE W REFLEX MICROSCOPIC      Status: None   Collection  Time    08/05/13  5:46 AM      Result Value Range   Color, Urine YELLOW  YELLOW   APPearance CLEAR  CLEAR   Specific Gravity, Urine 1.013  1.005 - 1.030   pH 7.5  5.0 - 8.0   Glucose, UA NEGATIVE  NEGATIVE mg/dL   Hgb urine dipstick NEGATIVE  NEGATIVE   Bilirubin Urine NEGATIVE  NEGATIVE   Ketones, ur NEGATIVE  NEGATIVE mg/dL   Protein, ur NEGATIVE  NEGATIVE mg/dL   Urobilinogen, UA 0.2  0.0 - 1.0 mg/dL   Nitrite NEGATIVE  NEGATIVE   Leukocytes, UA NEGATIVE  NEGATIVE   Comment: MICROSCOPIC NOT DONE ON URINES WITH NEGATIVE PROTEIN, BLOOD, LEUKOCYTES, NITRITE, OR GLUCOSE <1000 mg/dL.    Radiology Reports: Dg Chest 1 View  08/05/2013   CLINICAL DATA:  Status post fall. Concern for chest injury. History of smoking.  EXAM: CHEST - 1 VIEW  COMPARISON:  Chest radiograph from 03/27/2008  FINDINGS: The lungs are mildly hypoexpanded. Peribronchial thickening is noted. No pleural effusion or pneumothorax is seen. There is mild elevation of the right hemidiaphragm.  The cardiomediastinal silhouette is within normal limits. No acute osseous abnormalities are seen. Clips are noted within the right upper quadrant, reflecting prior cholecystectomy.  IMPRESSION: Lungs mildly hypoexpanded. Peribronchial thickening noted. Lungs otherwise clear. No displaced rib fractures seen.   Electronically Signed   By: Garald Balding M.D.   On: 08/05/2013 03:56   Dg Hip Complete Right  08/05/2013   CLINICAL DATA:  Unwitnessed fall, with right hip pain.  EXAM: RIGHT HIP - COMPLETE 2+ VIEW  COMPARISON:  Right hip radiographs performed 03/27/2008  FINDINGS: There is a minimally displaced subcapital fracture through the right femoral neck, though it does extend more distally along the lateral aspect of the neck. The right femoral head remains seated at the acetabulum. A chronic osseous fragment is again seen overlying the greater femoral trochanter, likely reflecting remote injury.  No additional  fractures are seen. The left hip joint is grossly unremarkable in appearance. The sacroiliac joints are within normal limits. The visualized bowel gas pattern is grossly unremarkable.  IMPRESSION: Minimally displaced subcapital fracture through the right femoral neck; it does extend more distally along the lateral aspect of the neck.   Electronically Signed   By: Garald Balding M.D.   On: 08/05/2013 02:48   Ct Head Wo Contrast  08/05/2013   CLINICAL DATA:  Status post fall, assess for injury. History of head and neck cancer.  EXAM: CT HEAD WITHOUT CONTRAST  TECHNIQUE: Contiguous axial images were obtained from the base of the skull through the vertex without intravenous contrast.  COMPARISON:  CT of the head August 06, 2009.  FINDINGS: Moderate to severe ventriculomegaly, likely on the basis of global parenchymal brain volume loss as there is commensurate enlargement of cerebral sulci and cerebellar folia, advanced. No intraparenchymal hemorrhage, mass effect nor midline shift. Confluent supratentorial white matter hypodensities are within normal range for patient's age and though non-specific suggest sequelae of chronic small vessel ischemic disease. No acute large vascular territory infarcts. Remote high right frontal cortical the subcortical infarct, present on prior examination.  No abnormal extra-axial fluid collections. Basal cisterns are patent. Moderate calcific atherosclerosis of the carotid siphons.  No skull fracture. Visualized paranasal sinuses and mastoid aircells are well-aerated. The included ocular globes and orbital contents are non-suspicious.  IMPRESSION: No acute intracranial process.  Moderate to severe global atrophy, unchanged.  Femoral high right frontal cortical/  subcortical infarct versus remote trauma. Severe white matter changes suggest chronic small vessel ischemic disease.   Electronically Signed   By: Elon Alas   On: 08/05/2013 06:34    Electrocardiogram: Sinus rhythm  at 84 beats a minute. Normal axis. First degree AV block is noted. No Q waves. No concerning ST or T-wave changes are noted. No previous EKGs are available  Problem List  Principal Problem:   Hip fracture, right Active Problems:   Essential hypertension, benign   Type II or unspecified type diabetes mellitus without mention of complication, not stated as uncontrolled   Seizure disorder   Dementia   Assessment: This is 80 year old , African American male, with the past medical history as stated above, presents with a fall, resulting in a fracture of the right hip. He has seizure disorder and hypertension. According to CT scan reports he has had stroke in past. No known history of heart disease. No significant murmurs appreciated on examination.  Plan: #1 Right hip fracture: Management per orthopedics. Pain control. From a preoperative medical evaluation standpoint, EKG is reviewed. Patient does not have any significant findings on the same. Other data has been reviewed. He does not have any significant murmurs. Functional status is unclear. Could be low to moderate. If he does require surgical intervention he may proceed to surgery without additional cardiac testing.  #2 history of seizure disorder: Continue with Keppra.  #3 history of hypertension: Continue with his antihypertensive regimen with holding parameters  #4 history of dementia: Reorient daily. Monitor behavior closely.  #5 there is a history of type 2 diabetes in his chart: But he's not on any medications. Appears to be diet controlled. Monitor his blood sugars and place him on sensitive sliding scale.   DVT Prophylaxis: SCDs for now Code Status: Full code per paperwork from nursing facility Family Communication: No family at bedside  Disposition Plan: Admit to the hospital. He will likely return to the skilled nursing facility upon discharge.   Further management decisions will depend on results of further testing and  patient's response to treatment.  [facility]  [facility] Pager [phone]  If 7PM-7AM, please contact night-coverage [email or web address] Password [facility]  08/05/2013, 8:49 AM

## 2013-08-05 NOTE — ED Notes (Signed)
Pt found on floor lying at end of bed on left hip.  Notified MD and Agricultural consultant.  Pt assessed for injury by MD.  Pt stated attempting to get to door.

## 2013-08-05 NOTE — ED Notes (Signed)
Pt from [facility], per EMS pt fell oob trying to get to the bathroom.  Pt has dementia and is pleasantly confused. Per EMS pt did not c/o of pain during palpation however when asking pt about pain he will report pain all over.

## 2013-08-05 NOTE — ED Notes (Signed)
Pt pulled IV out and undressed.  Pt also incontinent.  Bed changed.  IV restarted.

## 2013-08-05 NOTE — Progress Notes (Signed)
Patient ID: Ronald Sutton, male   DOB: 12/21/1930, 80 y.o.   MRN: [id number] I spoke with Mr. Skeens' niece and showed her his x-rays.  She understands that he needs screws placed in his right hip to stabilize his fracture.  She provided informed consent for this surgery for tomorrow 08/06/13.

## 2013-08-05 NOTE — ED Notes (Signed)
Patient transported to [facility] 

## 2013-08-06 ENCOUNTER — Encounter (HOSPITAL_COMMUNITY)
Admission: EM | Disposition: A | Discharge status: Skilled Nursing Facility | Payer: Self-pay | Source: Home / Self Care | Attending: Internal Medicine

## 2013-08-06 ENCOUNTER — Inpatient Hospital Stay (HOSPITAL_COMMUNITY): Payer: Medicare Other

## 2013-08-06 ENCOUNTER — Encounter (HOSPITAL_COMMUNITY): Payer: Self-pay | Admitting: Anesthesiology

## 2013-08-06 ENCOUNTER — Inpatient Hospital Stay (HOSPITAL_COMMUNITY): Payer: Medicare Other | Admitting: Anesthesiology

## 2013-08-06 ENCOUNTER — Encounter (HOSPITAL_COMMUNITY): Payer: Medicare Other | Admitting: Anesthesiology

## 2013-08-06 DIAGNOSIS — E119 Type 2 diabetes mellitus without complications: Secondary | ICD-10-CM

## 2013-08-06 DIAGNOSIS — S72001A Fracture of unspecified part of neck of right femur, initial encounter for closed fracture: Secondary | ICD-10-CM

## 2013-08-06 HISTORY — PX: HIP PINNING,CANNULATED: SHX1758

## 2013-08-06 LAB — BASIC METABOLIC PANEL
BUN: 11 mg/dL (ref 6–23)
CO2: 26 mEq/L (ref 19–32)
Calcium: 9.4 mg/dL (ref 8.4–10.5)
Chloride: 96 mEq/L (ref 96–112)
Creatinine, Ser: 0.85 mg/dL (ref 0.50–1.35)
GFR calc Af Amer: >90 mL/min (ref >90)
GFR calc non Af Amer: 80 mL/min — ABNORMAL LOW (ref >90)
GLUCOSE: 102 mg/dL — AB (ref 70–99)
POTASSIUM: 4.7 meq/L (ref 3.7–5.3)
SODIUM: 135 meq/L — AB (ref 137–147)

## 2013-08-06 LAB — GLUCOSE, CAPILLARY: Glucose-Capillary: 113 mg/dL — ABNORMAL HIGH (ref 70–99)

## 2013-08-06 LAB — CBC
HEMATOCRIT: 37.7 % — AB (ref 39.0–52.0)
Hemoglobin: 12.5 g/dL — ABNORMAL LOW (ref 13.0–17.0)
MCH: 28.4 pg (ref 26.0–34.0)
MCHC: 33.2 g/dL (ref 30.0–36.0)
MCV: 85.7 fL (ref 78.0–100.0)
Platelets: 291 10*3/uL (ref 150–400)
RBC: 4.4 MIL/uL (ref 4.22–5.81)
RDW: 11.6 % (ref 11.5–15.5)
WBC: 8.3 10*3/uL (ref 4.0–10.5)

## 2013-08-06 SURGERY — FIXATION, FEMUR, NECK, PERCUTANEOUS, USING SCREW
Anesthesia: General | Site: Hip | Laterality: Right

## 2013-08-06 MED ORDER — LACTATED RINGERS IV SOLN
INTRAVENOUS | Status: DC | PRN
  Administered 2013-08-06 (×2): via INTRAVENOUS

## 2013-08-06 MED ORDER — DEXTROSE 5 % IV SOLN
500.0000 mg | Freq: Four times a day (QID) | INTRAVENOUS | Status: DC | PRN
  Administered 2013-08-06: 500 mg via INTRAVENOUS
  Filled 2013-08-06 (×2): qty 5

## 2013-08-06 MED ORDER — FENTANYL CITRATE 0.05 MG/ML IJ SOLN
INTRAMUSCULAR | Status: AC
  Filled 2013-08-06: qty 2

## 2013-08-06 MED ORDER — PHENYLEPHRINE 40 MCG/ML (10ML) SYRINGE FOR IV PUSH (FOR BLOOD PRESSURE SUPPORT)
PREFILLED_SYRINGE | INTRAVENOUS | Status: AC
  Filled 2013-08-06: qty 10

## 2013-08-06 MED ORDER — PROPOFOL 10 MG/ML IV BOLUS
INTRAVENOUS | Status: DC | PRN
  Administered 2013-08-06: 100 mg via INTRAVENOUS

## 2013-08-06 MED ORDER — ASPIRIN EC 325 MG PO TBEC
325.0000 mg | DELAYED_RELEASE_TABLET | Freq: Every day | ORAL | Status: DC
  Administered 2013-08-07 – 2013-08-08 (×2): 325 mg via ORAL
  Filled 2013-08-06 (×3): qty 1

## 2013-08-06 MED ORDER — HYDROCODONE-ACETAMINOPHEN 5-325 MG PO TABS
1.0000 | ORAL_TABLET | Freq: Four times a day (QID) | ORAL | Status: DC | PRN
  Administered 2013-08-07 (×3): 1 via ORAL
  Filled 2013-08-06 (×4): qty 1

## 2013-08-06 MED ORDER — ONDANSETRON HCL 4 MG/2ML IJ SOLN
INTRAMUSCULAR | Status: AC
  Filled 2013-08-06: qty 2

## 2013-08-06 MED ORDER — CEFAZOLIN SODIUM-DEXTROSE 2-3 GM-% IV SOLR
INTRAVENOUS | Status: AC
  Filled 2013-08-06: qty 50

## 2013-08-06 MED ORDER — PROMETHAZINE HCL 25 MG/ML IJ SOLN: 6.2500 mg | INTRAMUSCULAR | Status: DC | PRN

## 2013-08-06 MED ORDER — ACETAMINOPHEN 325 MG PO TABS
650.0000 mg | ORAL_TABLET | Freq: Four times a day (QID) | ORAL | Status: DC | PRN
Start: 2013-08-06 — End: 2013-08-08
  Administered 2013-08-08: 650 mg via ORAL
  Filled 2013-08-06: qty 2

## 2013-08-06 MED ORDER — DEXTROSE 5 % IV SOLN
10.0000 mg | INTRAVENOUS | Status: DC | PRN
  Administered 2013-08-06: 40 ug/min via INTRAVENOUS

## 2013-08-06 MED ORDER — CEFAZOLIN SODIUM-DEXTROSE 2-3 GM-% IV SOLR
2.0000 g | Freq: Four times a day (QID) | INTRAVENOUS | Status: AC
  Administered 2013-08-06 – 2013-08-07 (×2): 2 g via INTRAVENOUS
  Filled 2013-08-06 (×2): qty 50

## 2013-08-06 MED ORDER — METHOCARBAMOL 500 MG PO TABS
500.0000 mg | ORAL_TABLET | Freq: Four times a day (QID) | ORAL | Status: DC | PRN
  Administered 2013-08-08: 500 mg via ORAL
  Filled 2013-08-06: qty 1

## 2013-08-06 MED ORDER — SODIUM CHLORIDE 0.9 % IV SOLN: INTRAVENOUS | Status: DC

## 2013-08-06 MED ORDER — NEOSTIGMINE METHYLSULFATE 1 MG/ML IJ SOLN
INTRAMUSCULAR | Status: DC | PRN
  Administered 2013-08-06: 4 mg via INTRAVENOUS

## 2013-08-06 MED ORDER — SUCCINYLCHOLINE CHLORIDE 20 MG/ML IJ SOLN
INTRAMUSCULAR | Status: DC | PRN
  Administered 2013-08-06: 100 mg via INTRAVENOUS

## 2013-08-06 MED ORDER — CEFAZOLIN SODIUM-DEXTROSE 2-3 GM-% IV SOLR
2.0000 g | INTRAVENOUS | Status: AC
  Administered 2013-08-06: 2 g via INTRAVENOUS

## 2013-08-06 MED ORDER — MENTHOL 3 MG MT LOZG: 1.0000 | LOZENGE | OROMUCOSAL | Status: DC | PRN

## 2013-08-06 MED ORDER — ONDANSETRON HCL 4 MG PO TABS: 4.0000 mg | ORAL_TABLET | Freq: Four times a day (QID) | ORAL | Status: DC | PRN

## 2013-08-06 MED ORDER — ACETAMINOPHEN 650 MG RE SUPP: 650.0000 mg | Freq: Four times a day (QID) | RECTAL | Status: DC | PRN

## 2013-08-06 MED ORDER — DEXAMETHASONE SODIUM PHOSPHATE 10 MG/ML IJ SOLN
INTRAMUSCULAR | Status: AC
  Filled 2013-08-06: qty 1

## 2013-08-06 MED ORDER — ONDANSETRON HCL 4 MG/2ML IJ SOLN: 4.0000 mg | Freq: Four times a day (QID) | INTRAMUSCULAR | Status: DC | PRN

## 2013-08-06 MED ORDER — PHENYLEPHRINE HCL 10 MG/ML IJ SOLN
INTRAMUSCULAR | Status: AC
  Filled 2013-08-06: qty 1

## 2013-08-06 MED ORDER — METOCLOPRAMIDE HCL 5 MG/ML IJ SOLN: 5.0000 mg | Freq: Three times a day (TID) | INTRAMUSCULAR | Status: DC | PRN

## 2013-08-06 MED ORDER — 0.9 % SODIUM CHLORIDE (POUR BTL) OPTIME
TOPICAL | Status: DC | PRN
  Administered 2013-08-06: 1000 mL

## 2013-08-06 MED ORDER — METOCLOPRAMIDE HCL 10 MG PO TABS: 5.0000 mg | ORAL_TABLET | Freq: Three times a day (TID) | ORAL | Status: DC | PRN

## 2013-08-06 MED ORDER — LABETALOL HCL 5 MG/ML IV SOLN
INTRAVENOUS | Status: AC
  Filled 2013-08-06: qty 4

## 2013-08-06 MED ORDER — PROPOFOL 10 MG/ML IV BOLUS
INTRAVENOUS | Status: AC
  Filled 2013-08-06: qty 20

## 2013-08-06 MED ORDER — ROCURONIUM BROMIDE 100 MG/10ML IV SOLN
INTRAVENOUS | Status: DC | PRN
  Administered 2013-08-06: 20 mg via INTRAVENOUS

## 2013-08-06 MED ORDER — LACTATED RINGERS IV SOLN
INTRAVENOUS | Status: DC
  Administered 2013-08-06: 21:00:00 via INTRAVENOUS

## 2013-08-06 MED ORDER — ZOLPIDEM TARTRATE 5 MG PO TABS: 5.0000 mg | ORAL_TABLET | Freq: Every evening | ORAL | Status: DC | PRN

## 2013-08-06 MED ORDER — LIDOCAINE HCL (CARDIAC) 20 MG/ML IV SOLN
INTRAVENOUS | Status: DC | PRN
  Administered 2013-08-06: 50 mg via INTRAVENOUS

## 2013-08-06 MED ORDER — FENTANYL CITRATE 0.05 MG/ML IJ SOLN
INTRAMUSCULAR | Status: DC | PRN
  Administered 2013-08-06 (×2): 50 ug via INTRAVENOUS

## 2013-08-06 MED ORDER — LABETALOL HCL 5 MG/ML IV SOLN
INTRAVENOUS | Status: DC | PRN
  Administered 2013-08-06 (×2): 2.5 mg via INTRAVENOUS

## 2013-08-06 MED ORDER — ROCURONIUM BROMIDE 100 MG/10ML IV SOLN
INTRAVENOUS | Status: AC
  Filled 2013-08-06: qty 1

## 2013-08-06 MED ORDER — FENTANYL CITRATE 0.05 MG/ML IJ SOLN
25.0000 ug | INTRAMUSCULAR | Status: DC | PRN
  Administered 2013-08-06 (×3): 25 ug via INTRAVENOUS

## 2013-08-06 MED ORDER — SUCCINYLCHOLINE CHLORIDE 20 MG/ML IJ SOLN
INTRAMUSCULAR | Status: AC
  Filled 2013-08-06: qty 1

## 2013-08-06 MED ORDER — DOCUSATE SODIUM 100 MG PO CAPS
100.0000 mg | ORAL_CAPSULE | Freq: Two times a day (BID) | ORAL | Status: DC
  Administered 2013-08-06 – 2013-08-08 (×4): 100 mg via ORAL

## 2013-08-06 MED ORDER — GLYCOPYRROLATE 0.2 MG/ML IJ SOLN
INTRAMUSCULAR | Status: DC | PRN
  Administered 2013-08-06: .8 mg via INTRAVENOUS

## 2013-08-06 MED ORDER — PHENYLEPHRINE HCL 10 MG/ML IJ SOLN
INTRAMUSCULAR | Status: DC | PRN
  Administered 2013-08-06: 40 ug via INTRAVENOUS

## 2013-08-06 MED ORDER — LIDOCAINE HCL (CARDIAC) 20 MG/ML IV SOLN
INTRAVENOUS | Status: AC
  Filled 2013-08-06: qty 5

## 2013-08-06 MED ORDER — GLYCOPYRROLATE 0.2 MG/ML IJ SOLN
INTRAMUSCULAR | Status: AC
  Filled 2013-08-06: qty 4

## 2013-08-06 MED ORDER — NEOSTIGMINE METHYLSULFATE 1 MG/ML IJ SOLN
INTRAMUSCULAR | Status: AC
  Filled 2013-08-06: qty 10

## 2013-08-06 MED ORDER — FENTANYL CITRATE 0.05 MG/ML IJ SOLN
INTRAMUSCULAR | Status: AC
  Filled 2013-08-06: qty 5

## 2013-08-06 MED ORDER — MORPHINE SULFATE 2 MG/ML IJ SOLN: 0.5000 mg | INTRAMUSCULAR | Status: DC | PRN

## 2013-08-06 MED ORDER — CISATRACURIUM BESYLATE 20 MG/10ML IV SOLN
INTRAVENOUS | Status: AC
  Filled 2013-08-06: qty 10

## 2013-08-06 MED ORDER — PHENOL 1.4 % MT LIQD
1.0000 | OROMUCOSAL | Status: DC | PRN
Start: 2013-08-06 — End: 2013-08-08

## 2013-08-06 MED ORDER — HYDROMORPHONE HCL PF 1 MG/ML IJ SOLN: 0.2500 mg | INTRAMUSCULAR | Status: DC | PRN

## 2013-08-06 SURGICAL SUPPLY — 39 items
BAG ZIPLOCK 12X15 (MISCELLANEOUS) ×3 IMPLANT
BNDG GAUZE ELAST 4 BULKY (GAUZE/BANDAGES/DRESSINGS) ×3 IMPLANT
DRAPE STERI IOBAN 125X83 (DRAPES) ×3 IMPLANT
DRSG AQUACEL AG ADV 3.5X 4 (GAUZE/BANDAGES/DRESSINGS) ×3 IMPLANT
DRSG TEGADERM 4X4.75 (GAUZE/BANDAGES/DRESSINGS) IMPLANT
DRSG XEROFORM 1X8 (GAUZE/BANDAGES/DRESSINGS) ×3 IMPLANT
DURAPREP 26ML APPLICATOR (WOUND CARE) ×3 IMPLANT
ELECT REM PT RETURN 9FT ADLT (ELECTROSURGICAL) ×3
ELECTRODE REM PT RTRN 9FT ADLT (ELECTROSURGICAL) ×1 IMPLANT
FACESHIELD LNG OPTICON STERILE (SAFETY) IMPLANT
GAUZE XEROFORM 5X9 LF (GAUZE/BANDAGES/DRESSINGS) ×3 IMPLANT
GLOVE BIO SURGEON STRL SZ7.5 (GLOVE) ×3 IMPLANT
GLOVE BIOGEL PI IND STRL 8 (GLOVE) ×1 IMPLANT
GLOVE BIOGEL PI INDICATOR 8 (GLOVE) ×2
GLOVE ECLIPSE 8.0 STRL XLNG CF (GLOVE) ×3 IMPLANT
GOWN STRL REUS W/TWL XL LVL3 (GOWN DISPOSABLE) ×3 IMPLANT
NS IRRIG 1000ML POUR BTL (IV SOLUTION) ×3 IMPLANT
PACK GENERAL/GYN (CUSTOM PROCEDURE TRAY) ×3 IMPLANT
PAD ABD 8X10 STRL (GAUZE/BANDAGES/DRESSINGS) ×3 IMPLANT
PAD CAST 4YDX4 CTTN HI CHSV (CAST SUPPLIES) ×1 IMPLANT
PADDING CAST COTTON 4X4 STRL (CAST SUPPLIES) ×3
PIN THREADED GUIDE ACE (PIN) ×9 IMPLANT
POSITIONER SURGICAL ARM (MISCELLANEOUS) ×3 IMPLANT
SCREW CANN 6.5 80MM (Screw) ×4 IMPLANT
SCREW CANN 6.5 90MM (Screw) ×3 IMPLANT
SCREW CANN LG 6.5 FLT 80X22 (Screw) ×2 IMPLANT
SCREW CANN LG 6.5 FLT 90X22 (Screw) ×1 IMPLANT
SPONGE GAUZE 4X4 12PLY (GAUZE/BANDAGES/DRESSINGS) ×3 IMPLANT
SPONGE LAP 18X18 X RAY DECT (DISPOSABLE) IMPLANT
STAPLER VISISTAT 35W (STAPLE) ×3 IMPLANT
SUT VIC AB 0 CT1 27 (SUTURE) ×3
SUT VIC AB 0 CT1 27XBRD ANTBC (SUTURE) ×1 IMPLANT
SUT VIC AB 1 CT1 27 (SUTURE) ×6
SUT VIC AB 1 CT1 27XBRD ANTBC (SUTURE) ×2 IMPLANT
SUT VIC AB 2-0 CT1 27 (SUTURE) ×3
SUT VIC AB 2-0 CT1 TAPERPNT 27 (SUTURE) ×1 IMPLANT
TOWEL OR 17X26 10 PK STRL BLUE (TOWEL DISPOSABLE) ×6 IMPLANT
TRAY FOLEY CATH 14FRSI W/METER (CATHETERS) IMPLANT
WATER STERILE IRR 1500ML POUR (IV SOLUTION) ×3 IMPLANT

## 2013-08-06 NOTE — Progress Notes (Signed)
Pt has been restless with multiple attempts to get out of bed throughout this shift. Easily redirected only to make another attempt to throw both legs out of bed, over rails, etc. Cooperative yet uses foul language when instructed that restraints will be needed if the staff is unable to keep him in the bed and safe. Kicking at NT during night when redirected.

## 2013-08-06 NOTE — Progress Notes (Signed)
Pt restless and unable to leave IV sight alone despite wrap with Kerlix and sitter at bedside. IV pulled out by pt. Will leave until am prior to surgery.

## 2013-08-06 NOTE — Progress Notes (Signed)
Patient ID: Ronald Sutton, male   DOB: 13-Oct-1930, 80 y.o.   MRN: [id number] No acute changes overnight.  Vitals stable.  I spoke with his niece in length and she understands fully that we will need to proceed to surgery today for surgical fixation of his broken right hip.  He is demented, so informed consent has been obtained from the niece.

## 2013-08-06 NOTE — Brief Op Note (Signed)
08/05/2013 - 08/06/2013  6:25 PM  PATIENT:  Ronald Sutton  80 y.o. male  PRE-OPERATIVE DIAGNOSIS:  right femoral neck fracture  POST-OPERATIVE DIAGNOSIS:  right femoral neck fracture  PROCEDURE:  Procedure(s): CANNULATED HIP PINNING SMITH AND NEPHEW (Right)  SURGEON:  Surgeon(s) and Role:    * Mcarthur Rossetti, MD - Primary  PHYSICIAN ASSISTANT: Benita Stabile, PA-C  ANESTHESIA:   general  EBL:  Total I/O In: -  Out: 150 [Urine:150]  BLOOD ADMINISTERED:none  DRAINS: none   LOCAL MEDICATIONS USED:  NONE  SPECIMEN:  No Specimen  DISPOSITION OF SPECIMEN:  N/A  COUNTS:  yes  TOURNIQUET:  * No tourniquets in log *  DICTATION: .Other Dictation: Dictation Number [id number]  PLAN OF CARE: Admit to inpatient   PATIENT DISPOSITION:  PACU - hemodynamically stable.   Delay start of Pharmacological VTE agent (>24hrs) due to surgical blood loss or risk of bleeding: no

## 2013-08-06 NOTE — Care Management Note (Signed)
    Page 1 of 1   08/06/2013     11:59:34 AM   CARE MANAGEMENT NOTE 08/06/2013  Patient:  Ronald Sutton, Ronald Sutton   Account Number:  [id number]  Date Initiated:  08/06/2013  Documentation initiated by:  Sherrin Daisy  Subjective/Objective Assessment:   dx rt femoral neck fracture-hip non displqced     Action/Plan:   Pt will go to OR today for surgery. CM will follow as needed. Pt is from NH. Will refer to CSW   Anticipated DC Date:  08/08/2013   Anticipated DC Plan:  SKILLED NURSING FACILITY  In-house referral  Clinical Social Worker      DC Planning Services  CM consult      Choice offered to / List presented to:             Status of service:  In process, will continue to follow Medicare Important Message given?   (If response is "NO", the following Medicare IM given date fields will be blank) Date Medicare IM given:   Date Additional Medicare IM given:    Discharge Disposition:    Per UR Regulation:  Reviewed for med. necessity/level of care/duration of stay  If discussed at [place] of Stay Meetings, dates discussed:    Comments:

## 2013-08-06 NOTE — Progress Notes (Signed)
INITIAL NUTRITION ASSESSMENT  DOCUMENTATION CODES Per approved criteria  -Not Applicable   INTERVENTION: - Diet advancement per MD, recommend diet advancement to puree (dysphagia 1), honey thick diet as pt on this diet PTA - Recommend Ensure Complete BID once diet advanced, thickened to honey thick consistency - Will continue to monitor   NUTRITION DIAGNOSIS: Inadequate oral intake related to inability to eat as evidenced by NPO.    Goal: Advance diet as tolerated to puree/honey thick  Monitor:  Weights, labs, diet advancement  Reason for Assessment: Low braden, consult   80 y.o. male  Admitting Dx: Hip fracture, right  ASSESSMENT: Pt with history of dementia, hypertension, seasonal allergies, seizure disorder, who lives in a skilled nursing facility, [place]. Patient has dementia. He is pleasantly confused and unable to provide much history. He does say that his legs hurt, but he was unable to specify which one. He was able to tell me that he fell. He denies any syncopal episode. However, due to his dementia history is not entirely reliable. Most of the history was available from previous notes. It appears that he fell out of the bed when trying to get up to go to the bathroom. There is no history of head or neck injury. No history of any loss of consciousness. Patient was brought in here and he was found to have a fracture in his right hip.   Pt alone in room, asleep with sitter. Per telephone conversation with nursing facility, pt was on a puree diet with honey thickened liquids PTA. Not on any nutritional supplements. Staff reports some days pt would eat 100%, other days not much of anything, it just depended on his mood.    Height: Ht Readings from Last 1 Encounters:  08/06/13 5\' 3"  (1.6 m)    Weight: 07/17/13             132 lb per telephone conversation with nursing home staff  Ideal Body Weight: 124 lb   % Ideal Body Weight: 106%   Usual Body Weight: 132 lb  %  Usual Body Weight: 100%  BMI:  23.4 kg/(m^2)  Estimated Nutritional Needs: Kcal: 1500-1700 Protein: 70-90g Fluid: 1.5-1.7L/day  Skin: Intact   Diet Order: NPO  EDUCATION NEEDS: -No education needs identified at this time   Intake/Output Summary (Last 24 hours) at 08/06/13 1052 Last data filed at 08/06/13 0810  Gross per 24 hour  Intake    190 ml  Output    950 ml  Net   -760 ml    Last BM: 1/22  Labs:   Recent Labs Lab 08/05/13 0425 08/06/13 0501  NA 132* 135*  K 4.9 4.7  CL 94* 96  CO2 29 26  BUN 15 11  CREATININE 0.88 0.85  CALCIUM 9.5 9.4  GLUCOSE 110* 102*    CBG (last 3)   Recent Labs  08/05/13 1657 08/05/13 2101 08/06/13 0720  GLUCAP 113* 113* 113*    Scheduled Meds: . amLODipine  10 mg Oral Daily   And  . benazepril  20 mg Oral Daily  . aspirin EC  325 mg Oral Daily  . Chlorhexidine Gluconate Cloth  6 each Topical Q0600  . levETIRAcetam  500 mg Oral BID  . mupirocin ointment  1 application Nasal BID  . senna  1 tablet Oral BID    Continuous Infusions: . sodium chloride Stopped (08/05/13 1948)    Past Medical History  Diagnosis Date  . Hypertension   . Diabetes mellitus  without complication   . CVA (cerebral infarction)     Right-sided hemiparesis  . Seizures   . A-fib   . Anemia   . Arthritis     osteoarthritis  . Cancer     Oral cancer?    Past Surgical History  Procedure Laterality Date  . Cholecystectomy      Ronald Sutton, [place], [place] Pager [phone] After Hours Pager

## 2013-08-06 NOTE — Progress Notes (Signed)
[facility] PROGRESS NOTE  Ronald Sutton [id number] DOB: 1930/10/21 DOA: 08/05/2013 PCP: Cyndee Brightly, MD  Assessment/Plan: Right femoral neck fracture -ORIF planned by Dr. Ninfa Linden -pt is medically stable for surgery -EKG--sinus rhythm, no ST-T wave changes -Secondary to mechanical fall -CT brain negative for acute changes -plan to return to [facility] when stable Hypertension -Continue Lotensin and amlodipine Diabetes mellitus type 2, controlled -Hemoglobin A1c 5.7 -Discontinue CBG checks Seizure disorder -Continue Keppra Dementia -Monitor closely   Family Communication:   No family at bedside Disposition Plan:   [facility] when stable      Procedures/Studies: Dg Chest 1 View  08/05/2013   CLINICAL DATA:  Status post fall. Concern for chest injury. History of smoking.  EXAM: CHEST - 1 VIEW  COMPARISON:  Chest radiograph from 03/27/2008  FINDINGS: The lungs are mildly hypoexpanded. Peribronchial thickening is noted. No pleural effusion or pneumothorax is seen. There is mild elevation of the right hemidiaphragm.  The cardiomediastinal silhouette is within normal limits. No acute osseous abnormalities are seen. Clips are noted within the right upper quadrant, reflecting prior cholecystectomy.  IMPRESSION: Lungs mildly hypoexpanded. Peribronchial thickening noted. Lungs otherwise clear. No displaced rib fractures seen.   Electronically Signed   By: Garald Balding M.D.   On: 08/05/2013 03:56   Dg Hip Complete Right  08/05/2013   CLINICAL DATA:  Unwitnessed fall, with right hip pain.  EXAM: RIGHT HIP - COMPLETE 2+ VIEW  COMPARISON:  Right hip radiographs performed 03/27/2008  FINDINGS: There is a minimally displaced subcapital fracture through the right femoral neck, though it does extend more distally along the lateral aspect of the neck. The right femoral head remains seated at the acetabulum. A chronic osseous fragment is again seen overlying the greater femoral  trochanter, likely reflecting remote injury.  No additional fractures are seen. The left hip joint is grossly unremarkable in appearance. The sacroiliac joints are within normal limits. The visualized bowel gas pattern is grossly unremarkable.  IMPRESSION: Minimally displaced subcapital fracture through the right femoral neck; it does extend more distally along the lateral aspect of the neck.   Electronically Signed   By: Garald Balding M.D.   On: 08/05/2013 02:48   Ct Head Wo Contrast  08/05/2013   CLINICAL DATA:  Status post fall, assess for injury. History of head and neck cancer.  EXAM: CT HEAD WITHOUT CONTRAST  TECHNIQUE: Contiguous axial images were obtained from the base of the skull through the vertex without intravenous contrast.  COMPARISON:  CT of the head August 06, 2009.  FINDINGS: Moderate to severe ventriculomegaly, likely on the basis of global parenchymal brain volume loss as there is commensurate enlargement of cerebral sulci and cerebellar folia, advanced. No intraparenchymal hemorrhage, mass effect nor midline shift. Confluent supratentorial white matter hypodensities are within normal range for patient's age and though non-specific suggest sequelae of chronic small vessel ischemic disease. No acute large vascular territory infarcts. Remote high right frontal cortical the subcortical infarct, present on prior examination.  No abnormal extra-axial fluid collections. Basal cisterns are patent. Moderate calcific atherosclerosis of the carotid siphons.  No skull fracture. Visualized paranasal sinuses and mastoid aircells are well-aerated. The included ocular globes and orbital contents are non-suspicious.  IMPRESSION: No acute intracranial process.  Moderate to severe global atrophy, unchanged.  Femoral high right frontal cortical/ subcortical infarct versus remote trauma. Severe white matter changes suggest chronic small vessel ischemic disease.   Electronically Signed   By: Elon Alas  On: 08/05/2013 06:34         Subjective: Patient was awake most of the night. No reports of vomiting, diarrhea, respiratory distress. Pain is controlled  Objective: Filed Vitals:   08/06/13 0400 08/06/13 0437 08/06/13 0749 08/06/13 0925  BP:  123/64    Pulse:  75    Temp:  98.2 F (36.8 C)    TempSrc:  Oral    Resp: 16 18 18    Height:    5\' 3"  (1.6 m)  SpO2:  93%      Intake/Output Summary (Last 24 hours) at 08/06/13 1003 Last data filed at 08/06/13 0810  Gross per 24 hour  Intake    190 ml  Output    950 ml  Net   -760 ml   Weight change:  Exam:   General:  Pt is alert, follows commands appropriately, not in acute distress  HEENT: No icterus, No thrush, [place]/AT  Cardiovascular: RRR, S1/S2, no rubs, no gallops  Respiratory: CTA bilaterally, no wheezing, no crackles, no rhonchi  Abdomen: Soft/+BS, non tender, non distended, no guarding  Extremities: No edema, No lymphangitis, No petechiae, No rashes, no synovitis  Data Reviewed: Basic Metabolic Panel:  Recent Labs Lab 08/05/13 0425 08/06/13 0501  NA 132* 135*  K 4.9 4.7  CL 94* 96  CO2 29 26  GLUCOSE 110* 102*  BUN 15 11  CREATININE 0.88 0.85  CALCIUM 9.5 9.4   Liver Function Tests: No results found for this basename: AST, ALT, ALKPHOS, BILITOT, PROT, ALBUMIN,  in the last 168 hours No results found for this basename: LIPASE, AMYLASE,  in the last 168 hours No results found for this basename: AMMONIA,  in the last 168 hours CBC:  Recent Labs Lab 08/05/13 0425 08/06/13 0501  WBC 8.9 8.3  NEUTROABS 6.5  --   HGB 12.9* 12.5*  HCT 39.0 37.7*  MCV 86.9 85.7  PLT 286 291   Cardiac Enzymes: No results found for this basename: CKTOTAL, CKMB, CKMBINDEX, TROPONINI,  in the last 168 hours BNP: No components found with this basename: POCBNP,  CBG:  Recent Labs Lab 08/05/13 1657 08/05/13 2101 08/06/13 0720  GLUCAP 113* 113* 113*    Recent Results (from the past 240 hour(s))  SURGICAL PCR  SCREEN     Status: Abnormal   Collection Time    08/05/13  6:50 PM      Result Value Range Status   MRSA, PCR POSITIVE (*) NEGATIVE Final   Comment: RESULT CALLED TO, READ BACK BY AND VERIFIED WITH:     CRUTCHFIELD,J RN @2105  ON 01.25.2015 BY MCREYNOLDS,B   Staphylococcus aureus POSITIVE (*) NEGATIVE Final   Comment:            The Xpert SA Assay (FDA     approved for NASAL specimens     in patients over 63 years of age),     is one component of     a comprehensive surveillance     program.  Test performance has     been validated by [employer] for patients greater     than or equal to 77 year old.     It is not intended     to diagnose infection nor to     guide or monitor treatment.     Scheduled Meds: . amLODipine  10 mg Oral Daily   And  . benazepril  20 mg Oral Daily  . aspirin EC  325 mg  Oral Daily  . Chlorhexidine Gluconate Cloth  6 each Topical Q0600  . insulin aspart  0-9 Units Subcutaneous TID WC  . levETIRAcetam  500 mg Oral BID  . mupirocin ointment  1 application Nasal BID  . senna  1 tablet Oral BID   Continuous Infusions: . sodium chloride Stopped (08/05/13 1948)     Ronald Markham, DO  [facility] Pager [phone]  If 7PM-7AM, please contact night-coverage [email or web address] Password [email or web address] 08/06/2013, 10:03 AM   LOS: 1 day

## 2013-08-06 NOTE — Transfer of Care (Signed)
Immediate Anesthesia Transfer of Care Note  Patient: Ronald Sutton  Procedure(s) Performed: Procedure(s) (LRB): CANNULATED HIP PINNING SMITH AND NEPHEW (Right)  Patient Location: PACU  Anesthesia Type: General  Level of Consciousness: sedated, patient cooperative and responds to stimulation  Airway & Oxygen Therapy: Patient Spontanous Breathing and Patient connected to face mask oxgen  Post-op Assessment: Report given to PACU RN and Post -op Vital signs reviewed and stable  Post vital signs: Reviewed and stable  Complications: No apparent anesthesia complications

## 2013-08-06 NOTE — Anesthesia Preprocedure Evaluation (Signed)
Anesthesia Evaluation  Patient identified by MRN, date of birth, ID band Patient awake    Reviewed: Allergy & Precautions, H&P , NPO status , Patient's Chart, lab work & pertinent test results  Airway Mallampati: II TM Distance: >3 FB Neck ROM: Full    Dental no notable dental hx.    Pulmonary neg pulmonary ROS, former smoker,  breath sounds clear to auscultation  Pulmonary exam normal       Cardiovascular hypertension, Pt. on medications + dysrhythmias Atrial Fibrillation Rhythm:Regular Rate:Normal     Neuro/Psych Seizures -,  CVA, Residual Symptoms negative psych ROS   GI/Hepatic negative GI ROS, Neg liver ROS,   Endo/Other  diabetes, Oral Hypoglycemic Agents  Renal/GU negative Renal ROS  negative genitourinary   Musculoskeletal negative musculoskeletal ROS (+)   Abdominal   Peds negative pediatric ROS (+)  Hematology negative hematology ROS (+)   Anesthesia Other Findings   Reproductive/Obstetrics negative OB ROS                           Anesthesia Physical Anesthesia Plan  ASA: III  Anesthesia Plan: General   Post-op Pain Management:    Induction: Intravenous  Airway Management Planned: Oral ETT  Additional Equipment:   Intra-op Plan:   Post-operative Plan: Extubation in OR  Informed Consent: I have reviewed the patients History and Physical, chart, labs and discussed the procedure including the risks, benefits and alternatives for the proposed anesthesia with the patient or authorized representative who has indicated his/her understanding and acceptance.   Dental advisory given  Plan Discussed with: CRNA and Surgeon  Anesthesia Plan Comments:         Anesthesia Quick Evaluation

## 2013-08-07 ENCOUNTER — Encounter (HOSPITAL_COMMUNITY): Payer: Self-pay | Admitting: Orthopaedic Surgery

## 2013-08-07 LAB — CBC
HCT: 38.6 % — ABNORMAL LOW (ref 39.0–52.0)
Hemoglobin: 13.2 g/dL (ref 13.0–17.0)
MCH: 28.6 pg (ref 26.0–34.0)
MCHC: 34.2 g/dL (ref 30.0–36.0)
MCV: 83.5 fL (ref 78.0–100.0)
PLATELETS: 131 10*3/uL — AB (ref 150–400)
RBC: 4.62 MIL/uL (ref 4.22–5.81)
RDW: 11.6 % (ref 11.5–15.5)
WBC: 12.4 10*3/uL — ABNORMAL HIGH (ref 4.0–10.5)

## 2013-08-07 LAB — GLUCOSE, CAPILLARY
GLUCOSE-CAPILLARY: 117 mg/dL — AB (ref 70–99)
Glucose-Capillary: 87 mg/dL (ref 70–99)

## 2013-08-07 LAB — BASIC METABOLIC PANEL
BUN: 11 mg/dL (ref 6–23)
CALCIUM: 9.2 mg/dL (ref 8.4–10.5)
CO2: 21 mEq/L (ref 19–32)
CREATININE: 0.73 mg/dL (ref 0.50–1.35)
Chloride: 94 mEq/L — ABNORMAL LOW (ref 96–112)
GFR, EST NON AFRICAN AMERICAN: 85 mL/min — AB (ref >90)
Glucose, Bld: 88 mg/dL (ref 70–99)
Potassium: 5.5 mEq/L — ABNORMAL HIGH (ref 3.7–5.3)
Sodium: 133 mEq/L — ABNORMAL LOW (ref 137–147)

## 2013-08-07 MED ORDER — ACETAMINOPHEN 325 MG PO TABS: 650.0000 mg | ORAL_TABLET | Freq: Four times a day (QID) | ORAL | Status: DC | PRN

## 2013-08-07 MED ORDER — HYDROCODONE-ACETAMINOPHEN 5-325 MG PO TABS
1.0000 | ORAL_TABLET | ORAL | Status: DC | PRN
  Administered 2013-08-07 – 2013-08-08 (×3): 1 via ORAL
  Filled 2013-08-07 (×3): qty 1

## 2013-08-07 NOTE — Progress Notes (Signed)
OT Cancellation Note  Patient Details Name: Reo Portela MRN: [id number] DOB: July 10, 1931   Cancelled Treatment:    Reason Eval/Treat Not Completed: Other (comment) Will defer OT eval to SNF.  Jules Schick [phone] 08/07/2013, 11:58 AM

## 2013-08-07 NOTE — Discharge Summary (Addendum)
Physician Discharge Summary  [name] [id number] DOB: 03/02/1931 DOA: 08/05/2013  PCP: Cyndee Brightly, MD  Admit date: 08/05/2013 Discharge date: 08/08/2013  Recommendations for Outpatient Follow-up:  1. Pt will need to follow up with PCP in 2 weeks post discharge 2. Please obtain BMP to evaluate electrolytes and kidney function 3. Please also check CBC to evaluate Hg and Hct levels   Discharge Diagnoses:  Principal Problem:   Hip fracture, right Active Problems:   Essential hypertension, benign   Type II or unspecified type diabetes mellitus without mention of complication, not stated as uncontrolled   Seizure disorder   Dementia   Hip fracture   Fracture of femoral neck, right Right femoral neck fracture  -ORIF 08/06/13--by Dr. Ninfa Linden  -PT eval-->back to SNF -EKG--sinus rhythm, no ST-T wave changes  -Secondary to mechanical fall  -CT brain negative for acute changes  -plan to return to [facility] when stable  Hypertension  -Continue Lotensin and amlodipine  Diabetes mellitus type 2, controlled  -Hemoglobin A1c 5.7  -Discontinue CBG checks  Seizure disorder  -Continue Keppra  Dementia  -pt required a sitter initially during hospitalization -after ORIF, pt was less aggitated -sitter was discontinued x 24 hrs -UA neg for pyuria PseudoHyperkalemia-- -moderate hemolysis noted on smear which likely elevated the K -repeat K in am -continue IVF  Discharge Condition: stable  Disposition:  Follow-up Information   Follow up with Mcarthur Rossetti, MD. Schedule an appointment as soon as possible for a visit in 2 weeks.   Specialty:  Orthopedic Surgery   Contact information:   [place] [address] [phone]       Diet: heart healthy Wt Readings from Last 3 Encounters:  08/06/13 63.8 kg (140 lb 10.5 oz)  08/06/13 63.8 kg (140 lb 10.5 oz)    History of present illness:  80 y.o. male with a past medical history dementia,  hypertension, seasonal allergies, seizure disorder, who lives in a skilled nursing facility, [facility]. Patient has dementia. He is pleasantly confused and unable to provide much history. He does say that his legs hurt, but he was unable to specify which one. He was able to tell me that he fell. He denies any syncopal episode. However, due to his dementia history is not entirely reliable. Most of the history was available from previous notes. It appears that he fell out of the bed when trying to get up to go to the bathroom. There is no history of head or neck injury. No history of any loss of consciousness. Patient was brought in here and he was found to have a fracture in his right hip. Orthopedics has been consulted     Consultants: Ortho--Dr. Ninfa Linden  Discharge Exam: Filed Vitals:   08/07/13 1521  BP:   Pulse:   Temp:   Resp: 18   Filed Vitals:   08/07/13 1100 08/07/13 1110 08/07/13 1437 08/07/13 1521  BP: 148/79  150/82   Pulse: 89  92   Temp: 98.7 F (37.1 C)  98.3 F (36.8 C)   TempSrc: Oral  Oral   Resp: 18 18 16 18   Height:      Weight:      SpO2: 97% 98% 98% 97%   General: Alert and awake, NAD, pleasant, cooperative Cardiovascular: RRR, no rub, no gallop, no S3 Respiratory: CTAB, no wheeze, no rhonchi Abdomen:soft, nontender, nondistended, positive bowel sounds Extremities: No edema, No lymphangitis, no petechiae  Discharge Instructions      Discharge  Orders   Future Orders Complete By Expires   Full weight bearing  As directed    Questions:     Laterality:     Extremity:         Medication List         acetaminophen 325 MG tablet  Commonly known as:  TYLENOL  Take 2 tablets (650 mg total) by mouth every 6 (six) hours as needed for mild pain (or Fever >/= 101).     amLODipine-benazepril 10-20 MG per capsule  Commonly known as:  LOTREL  Take 1 capsule by mouth daily.     aspirin 325 MG EC tablet  Take 325 mg by mouth daily.     cetirizine 10  MG tablet  Commonly known as:  ZYRTEC  Take 10 mg by mouth daily.     fluticasone 50 MCG/ACT nasal spray  Commonly known as:  FLONASE  Place 1 spray into both nostrils daily. For allergies     levETIRAcetam 500 MG tablet  Commonly known as:  KEPPRA  Take 500 mg by mouth 2 (two) times daily.     MULTI VITAMIN MENS tablet  Take 1 tablet by mouth daily.     nitroGLYCERIN 0.4 MG SL tablet  Commonly known as:  NITROSTAT  Place 0.4 mg under the tongue every 5 (five) minutes as needed for chest pain.     sennosides-docusate sodium 8.6-50 MG tablet  Commonly known as:  SENOKOT-S  Take 1 tablet by mouth 2 (two) times daily.         The results of significant diagnostics from this hospitalization (including imaging, microbiology, ancillary and laboratory) are listed below for reference.    Significant Diagnostic Studies: Dg Chest 1 View  08/05/2013   CLINICAL DATA:  Status post fall. Concern for chest injury. History of smoking.  EXAM: CHEST - 1 VIEW  COMPARISON:  Chest radiograph from 03/27/2008  FINDINGS: The lungs are mildly hypoexpanded. Peribronchial thickening is noted. No pleural effusion or pneumothorax is seen. There is mild elevation of the right hemidiaphragm.  The cardiomediastinal silhouette is within normal limits. No acute osseous abnormalities are seen. Clips are noted within the right upper quadrant, reflecting prior cholecystectomy.  IMPRESSION: Lungs mildly hypoexpanded. Peribronchial thickening noted. Lungs otherwise clear. No displaced rib fractures seen.   Electronically Signed   By: Garald Balding M.D.   On: 08/05/2013 03:56   Dg Hip Complete Right  08/05/2013   CLINICAL DATA:  Unwitnessed fall, with right hip pain.  EXAM: RIGHT HIP - COMPLETE 2+ VIEW  COMPARISON:  Right hip radiographs performed 03/27/2008  FINDINGS: There is a minimally displaced subcapital fracture through the right femoral neck, though it does extend more distally along the lateral aspect of the  neck. The right femoral head remains seated at the acetabulum. A chronic osseous fragment is again seen overlying the greater femoral trochanter, likely reflecting remote injury.  No additional fractures are seen. The left hip joint is grossly unremarkable in appearance. The sacroiliac joints are within normal limits. The visualized bowel gas pattern is grossly unremarkable.  IMPRESSION: Minimally displaced subcapital fracture through the right femoral neck; it does extend more distally along the lateral aspect of the neck.   Electronically Signed   By: Garald Balding M.D.   On: 08/05/2013 02:48   Dg Hip Operative Right  08/07/2013   CLINICAL DATA:  Right hip subcapital femoral neck fracture  EXAM: DG OPERATIVE RIGHT HIP  COMPARISON:  08/05/2013  FINDINGS: Fixation screws  noted through the right femoral neck stabilizing the subcapital femoral neck fracture. Anatomic alignment. Degenerative changes noted of the hip joint. No subluxation or dislocation. No complicating feature.  IMPRESSION: Status post ORIF with 3 fixation screws to stabilize the right femoral neck subcapital fracture   Electronically Signed   By: Daryll Brod M.D.   On: 08/07/2013 02:58   Ct Head Wo Contrast  08/05/2013   CLINICAL DATA:  Status post fall, assess for injury. History of head and neck cancer.  EXAM: CT HEAD WITHOUT CONTRAST  TECHNIQUE: Contiguous axial images were obtained from the base of the skull through the vertex without intravenous contrast.  COMPARISON:  CT of the head August 06, 2009.  FINDINGS: Moderate to severe ventriculomegaly, likely on the basis of global parenchymal brain volume loss as there is commensurate enlargement of cerebral sulci and cerebellar folia, advanced. No intraparenchymal hemorrhage, mass effect nor midline shift. Confluent supratentorial white matter hypodensities are within normal range for patient's age and though non-specific suggest sequelae of chronic small vessel ischemic disease. No acute  large vascular territory infarcts. Remote high right frontal cortical the subcortical infarct, present on prior examination.  No abnormal extra-axial fluid collections. Basal cisterns are patent. Moderate calcific atherosclerosis of the carotid siphons.  No skull fracture. Visualized paranasal sinuses and mastoid aircells are well-aerated. The included ocular globes and orbital contents are non-suspicious.  IMPRESSION: No acute intracranial process.  Moderate to severe global atrophy, unchanged.  Femoral high right frontal cortical/ subcortical infarct versus remote trauma. Severe white matter changes suggest chronic small vessel ischemic disease.   Electronically Signed   By: Elon Alas   On: 08/05/2013 06:34   Dg Pelvis Portable  08/06/2013   CLINICAL DATA:  Right hip subcapital femoral neck fracture, operative fixation  EXAM: PORTABLE PELVIS 1-2 VIEWS  COMPARISON:  08/05/2013  FINDINGS: 3 fixation screws reduced the right proximal femur subcapital femoral neck fracture. Anatomic alignment. Stable appearance. No malalignment. No hardware abnormality. Skin staples laterally.  IMPRESSION: Screw fixation of the right proximal femur subcapital femoral neck fracture. No complicating features   Electronically Signed   By: Daryll Brod M.D.   On: 08/06/2013 22:45     Microbiology: Recent Results (from the past 240 hour(s))  SURGICAL PCR SCREEN     Status: Abnormal   Collection Time    08/05/13  6:50 PM      Result Value Range Status   MRSA, PCR POSITIVE (*) NEGATIVE Final   Comment: RESULT CALLED TO, READ BACK BY AND VERIFIED WITH:     CRUTCHFIELD,J RN @2105  ON 01.25.2015 BY MCREYNOLDS,B   Staphylococcus aureus POSITIVE (*) NEGATIVE Final   Comment:            The Xpert SA Assay (FDA     approved for NASAL specimens     in patients over 77 years of age),     is one component of     a comprehensive surveillance     program.  Test performance has     been validated by [employer] for  patients greater     than or equal to 72 year old.     It is not intended     to diagnose infection nor to     guide or monitor treatment.     Labs: Basic Metabolic Panel:  Recent Labs Lab 08/05/13 0425 08/06/13 0501 08/07/13 0512  NA 132* 135* 133*  K 4.9 4.7 5.5*  CL  94* 96 94*  CO2 29 26 21   GLUCOSE 110* 102* 88  BUN 15 11 11   CREATININE 0.88 0.85 0.73  CALCIUM 9.5 9.4 9.2   Liver Function Tests: No results found for this basename: AST, ALT, ALKPHOS, BILITOT, PROT, ALBUMIN,  in the last 168 hours No results found for this basename: LIPASE, AMYLASE,  in the last 168 hours No results found for this basename: AMMONIA,  in the last 168 hours CBC:  Recent Labs Lab 08/05/13 0425 08/06/13 0501 08/07/13 0512  WBC 8.9 8.3 12.4*  NEUTROABS 6.5  --   --   HGB 12.9* 12.5* 13.2  HCT 39.0 37.7* 38.6*  MCV 86.9 85.7 83.5  PLT 286 291 131*   Cardiac Enzymes: No results found for this basename: CKTOTAL, CKMB, CKMBINDEX, TROPONINI,  in the last 168 hours BNP: No components found with this basename: POCBNP,  CBG:  Recent Labs Lab 08/05/13 1657 08/05/13 2101 08/06/13 0720 08/07/13 0135 08/07/13 0730  GLUCAP 113* 113* 113* 117* 87    Time coordinating discharge:  Greater than 30 minutes  Signed:  TAT, DAVID, DO [facility] Pager: [phone] 08/07/2013, 9:27 PM

## 2013-08-07 NOTE — Progress Notes (Signed)
Patient ID: Ronald Sutton, male   DOB: [birth year], 80 y.o.   MRN: [id number] Did well last evening with screw placement right hip.  At baseline dementia.  Vitals stable. Dressing intact right hip.  Can be up with therapy with full weight bearing as tolerated right hip.  Can return to SNF from ortho standpoint.  Aspirin only for DVT coverage. Tylenol for pain.

## 2013-08-07 NOTE — Progress Notes (Signed)
CSW assisting with d/c planning. Pt is from [facility]. Facility contacted and will have SNF bed available at time of d/c. Pt will need to be 24 hr sitter free prior to d/c. NSG aware . Will update family.  Werner Lean LCSW [phone]

## 2013-08-07 NOTE — Evaluation (Signed)
Physical Therapy Evaluation Patient Details Name: Ronald Sutton MRN: [id number] DOB: 18-Nov-1930 Today's Date: 08/07/2013 Time: 4098-1191 PT Time Calculation (min): 22 min  PT Assessment / Plan / Recommendation History of Present Illness  s/p R cannulated hip pinning after fall at ALF resulting in femoral neck fx  Clinical Impression  Pt admitted with above. Pt currently with functional limitations due to the deficits listed below (see PT Problem List).  Pt will benefit from skilled PT to increase their independence and safety with mobility to allow discharge to the venue listed below.  Pt reports mostly using w/c at ALF due to "leg pain" and also with hx of falls.  Pt's family member reports pt was ambulating short distance with RW as well but now mostly uses w/c for mobility.     PT Assessment  Patient needs continued PT services    Follow Up Recommendations  SNF    Does the patient have the potential to tolerate intense rehabilitation      Barriers to Discharge        Equipment Recommendations  None recommended by PT    Recommendations for Other Services     Frequency Min 3X/week    Precautions / Restrictions Precautions Precautions: Fall Restrictions Weight Bearing Restrictions: No Other Position/Activity Restrictions: WBAT   Pertinent Vitals/Pain Pt reports no pain at rest however increased with movement and mobility.  Pt repositioned to comfort in supine upon leaving room.      Mobility  Bed Mobility Overal bed mobility: Needs Assistance Bed Mobility: Supine to Sit;Sit to Supine Supine to sit: +2 for physical assistance;Mod assist Sit to supine: +2 for physical assistance;Mod assist General bed mobility comments: verbal cues for technique, pt assist limited by pain Transfers Overall transfer level: Needs assistance Equipment used: Rolling walker (2 wheeled) Transfers: Sit to/from Stand Sit to Stand: Mod assist;+2 physical assistance General transfer comment:  verbal cues for technique, pushing down through RW to assist with R LE pain during [place], performed 2x, pt did not wish to attempt ambulation today due to pain    Exercises     PT Diagnosis: Difficulty walking;Acute pain  PT Problem List: Decreased strength;Decreased mobility;Decreased activity tolerance;Decreased balance;Decreased knowledge of use of DME;Pain PT Treatment Interventions: DME instruction;Gait training;Functional mobility training;Therapeutic activities;Therapeutic exercise;Patient/family education     PT Goals(Current goals can be found in the care plan section) Acute Rehab PT Goals PT Goal Formulation: With patient Time For Goal Achievement: 08/14/13 Potential to Achieve Goals: Good  Visit Information  Last PT Received On: 08/07/13 Assistance Needed: +2 History of Present Illness: s/p R cannulated hip pinning after fall at ALF resulting in femoral neck fx       Prior [place] expects to be discharged to:: Assisted living Home Equipment: Wheelchair - [employer] - 2 wheels Additional Comments: From ALF per notes, will likely need SNF Prior Function Level of Independence: Independent with assistive device(s) Comments: family and pt report pt modified independent with short distance ambulation with RW however more recently mobilizing with w/c Communication Communication: HOH    Cognition  Cognition Arousal/Alertness: Awake/alert Behavior During Therapy: WFL for tasks assessed/performed Overall Cognitive Status: History of cognitive impairments - at baseline    Extremity/Trunk Assessment Lower Extremity Assessment Lower Extremity Assessment: RLE deficits/detail RLE Deficits / Details: required assist for bed mobility, however functionally observed some movement against gravity however limited by pain   Balance    End of Session PT - End of Session Equipment Utilized During  Treatment: Gait belt Activity Tolerance: Patient  limited by pain Patient left: in bed;with call bell/phone within reach;with bed alarm set Nurse Communication: Mobility status  GP     Varie Machamer,[name] 08/07/2013, 12:52 PM Carmelia Bake, PT, DPT 08/07/2013 Pager: [phone]

## 2013-08-07 NOTE — Progress Notes (Signed)
Clinical Social Work Department BRIEF PSYCHOSOCIAL ASSESSMENT 08/07/2013  Patient:  Ronald Sutton, Ronald Sutton     Account Number:  [id number]     Admit date:  08/05/2013  Clinical Social Worker:  Lacie Scotts  Date/Time:  08/07/2013 11:33 AM  Referred by:  Physician  Date Referred:  08/07/2013 Referred for  SNF Placement   Other Referral:   Interview type:   Other interview type:    PSYCHOSOCIAL DATA Living Status:  FACILITY Admitted from facility:  [facility] Level of care:  Assisted Living Primary support name:  Jeric Slagel Primary support relationship to patient:  FAMILY Degree of support available:   supportive    CURRENT CONCERNS Current Concerns  Post-Acute Placement   Other Concerns:    SOCIAL WORK ASSESSMENT / PLAN Pt is an 80 yr old gentleman admitted from [facility] . CSW met with pt / family to assist with d/c planning. Family would like pt to return to [facility] following hospital d/c. SNF contacted and d/c plan confirmed. Pt will move from ALF to SNF at [facility] on his return. Pt / family are in agreement with this plan. Pt will need to be 24 hrs without a sitter prior to d/c. CSW will continue to follow to assist with d/c planning   Assessment/plan status:  Psychosocial Support/Ongoing Assessment of Needs Other assessment/ plan:   Information/referral to community resources:   Insurance coverage for ambulance transport reviewed.  PASRR for SNF placement completed.  PATIENT'S/FAMILY'S RESPONSE TO PLAN OF CARE: " My uncle will need to go to the rehab unit at [facility] "  Pt /family are pleased that SNF will have an opening in skilled unit on d/c.   Werner Lean LCSW [phone]

## 2013-08-07 NOTE — Discharge Instructions (Signed)
Full weight bearing as tolerated right hip. New dry dressing right hip incision. Can get incision wet in the shower

## 2013-08-07 NOTE — Op Note (Signed)
Ronald Sutton, Ronald Sutton                   ACCOUNT NO.:  [id number]  MEDICAL RECORD NO.:  [id number]  LOCATION:  6010                         FACILITY:  [facility]  PHYSICIAN:  Lind Guest. Ninfa Linden, M.D.DATE OF BIRTH:  01-27-1931  DATE OF PROCEDURE:  08/06/2013 DATE OF DISCHARGE:                              OPERATIVE REPORT   PREOPERATIVE DIAGNOSIS:  Right hip nondisplaced femoral neck fracture.  POSTOPERATIVE DIAGNOSIS:  Right hip nondisplaced femoral neck fracture.  PROCEDURE:  Cannulated screw fixation of right hip femoral neck fracture.  IMPLANTS:  Biomet 6.5 mm cannulated screws x3.  SURGEON:  Lind Guest. Ninfa Linden, M.D.  ASSISTANT:  Erskine Emery, PA-C.  ANESTHESIA:  General.  BLOOD LOSS:  Less than 100 mL.  COMPLICATIONS:  None.  INDICATIONS:  Ronald Sutton is an 80 year old nursing home patient with dementia who has had a series of falls recently.  He had left hip pain after a fall early Sunday morning and was seen in the emergency room, was found to have a nondisplaced right hip femoral neck fracture.  I talked to his niece who is next of kin and gave permission to proceed with cannulated screw fixation of the hip.  I felt this is the best treatment for him because the fracture is completely nondisplaced and this would be better for him to try to heal this as opposed to hemiarthroplasty.  I talked to her about this in length and she did agreed for [place] to proceed.  PROCEDURE DESCRIPTION:  After informed consent was obtained, appropriate right hip was marked.  He was brought to the operating room, placed supine on the fracture table.  General anesthesia was then obtained. His right operative hip was placed in in-line skeletal traction, but no traction applied due to nondisplaced nature of the fracture.  Perineal post was in place and his left nonoperative hip was flexed and abducted out of the field.  We then assessed his right hip under direct fluoroscopy and found the  fracture to be nondisplaced in AP and lateral planes.  We then prepped the right hip with DuraPrep and sterile drapes including sterile shower curtain drape.  Time-out was called to identify correct patient, correct right hip.  We then made an incision just distal to the greater trochanter and we were able to then place 3 guide pins under direct fluoroscopic guidance in an inverted triangle type of format.  We then took measurements off of the 3 guide pins and placed 3 titanium cannulated screws with the inferior screw being measuring 90 mm and the anterosuperior and posterior superior screws measuring 80 mm in length.  We then removed all guide pins and put the hip through range of motion.  It was moved to the unit in stable.  We then irrigated the tissue with normal saline solution.  We closed the deep incision with 0 Vicryl followed by 2-0 Vicryl in subcutaneous tissue, and staples on the skin.  He was taken off the fracture table, awakened, extubated, and taken to recovery room in stable condition.  All final counts were correct and there were no complications noted.  Of note, Erskine Emery, PA-C was present in the entire case  and his presence was crucial for facilitating this case.     Lind Guest. Ninfa Linden, M.D.     CYB/MEDQ  D:  08/06/2013  T:  08/07/2013  Job:  [id number]

## 2013-08-08 LAB — CBC
HEMATOCRIT: 32.8 % — AB (ref 39.0–52.0)
Hemoglobin: 10.7 g/dL — ABNORMAL LOW (ref 13.0–17.0)
MCH: 28 pg (ref 26.0–34.0)
MCHC: 32.6 g/dL (ref 30.0–36.0)
MCV: 85.9 fL (ref 78.0–100.0)
Platelets: 264 10*3/uL (ref 150–400)
RBC: 3.82 MIL/uL — ABNORMAL LOW (ref 4.22–5.81)
RDW: 11.9 % (ref 11.5–15.5)
WBC: 8.9 10*3/uL (ref 4.0–10.5)

## 2013-08-08 LAB — BASIC METABOLIC PANEL
BUN: 16 mg/dL (ref 6–23)
CHLORIDE: 96 meq/L (ref 96–112)
CO2: 25 mEq/L (ref 19–32)
CREATININE: 0.87 mg/dL (ref 0.50–1.35)
Calcium: 8.6 mg/dL (ref 8.4–10.5)
GFR calc non Af Amer: 79 mL/min — ABNORMAL LOW (ref >90)
Glucose, Bld: 120 mg/dL — ABNORMAL HIGH (ref 70–99)
Potassium: 4 mEq/L (ref 3.7–5.3)
Sodium: 131 mEq/L — ABNORMAL LOW (ref 137–147)

## 2013-08-08 LAB — GLUCOSE, CAPILLARY
Glucose-Capillary: 125 mg/dL — ABNORMAL HIGH (ref 70–99)
Glucose-Capillary: 115 mg/dL — ABNORMAL HIGH (ref 70–99)
Glucose-Capillary: 116 mg/dL — ABNORMAL HIGH (ref 70–99)

## 2013-08-08 NOTE — Progress Notes (Signed)
Physical Therapy Treatment Patient Details Name: Ronald Sutton MRN: [id number] DOB: February 08, 1931 Today's Date: 08/08/2013 Time: 3500-9381 PT Time Calculation (min): 24 min  PT Assessment / Plan / Recommendation  History of Present Illness s/p R cannulated hip pinning after fall at ALF resulting in femoral neck fx   PT Comments   Pt ambulated short distances today with increased +2 assist and also performed exercises.  Pt appeared happy to be up OOB.   Follow Up Recommendations  SNF     Does the patient have the potential to tolerate intense rehabilitation     Barriers to Discharge        Equipment Recommendations  None recommended by PT    Recommendations for Other Services    Frequency Min 3X/week   Progress towards PT Goals Progress towards PT goals: Progressing toward goals  Plan Current plan remains appropriate    Precautions / Restrictions Precautions Precautions: Fall Restrictions Other Position/Activity Restrictions: WBAT   Pertinent Vitals/Pain No pain at rest, pain increased with mobility, repositioned, RN in to medicate pt end of session    Mobility  Bed Mobility Overal bed mobility: Needs Assistance Bed Mobility: Supine to Sit;Sit to Supine Supine to sit: Min assist General bed mobility comments: verbal cues for technique, assist for R LE Transfers Overall transfer level: Needs assistance Equipment used: Rolling walker (2 wheeled) Transfers: Sit to/from Stand Sit to Stand: Mod assist;+2 physical assistance General transfer comment: verbal cues for technique, pushing down through RW to assist with R LE pain during [place] Ambulation/Gait Ambulation/Gait assistance: +2 physical assistance;Total assist Ambulation Distance (Feet): 10 Feet Assistive device: Rolling walker (2 wheeled) Gait Pattern/deviations: Step-through pattern;Narrow base of support;Scissoring General Gait Details: 6 feet and then 4 feet, required total assist as pt would take step and difficuly  WBing through arms despite cues, seated rest break required    Exercises Total Joint Exercises Ankle Circles/Pumps: AROM;Both;10 reps Quad Sets: AROM;Both;10 reps Towel Squeeze: AROM;Both;10 reps Short Arc Quad: 10 reps;AROM;Right Heel Slides: AROM;Right;10 reps Hip ABduction/ADduction: AROM;Right;10 reps   PT Diagnosis:    PT Problem List:   PT Treatment Interventions:     PT Goals (current goals can now be found in the care plan section)    Visit Information  Last PT Received On: 08/08/13 Assistance Needed: +2 History of Present Illness: s/p R cannulated hip pinning after fall at ALF resulting in femoral neck fx    Subjective Data      Cognition  Cognition Arousal/Alertness: Awake/alert Behavior During Therapy: WFL for tasks assessed/performed Overall Cognitive Status: History of cognitive impairments - at baseline    Balance     End of Session PT - End of Session Equipment Utilized During Treatment: Gait belt Activity Tolerance: Patient tolerated treatment well Patient left: in bed;with call bell/phone within reach;with chair alarm set   GP     Ronald Sutton,[name] 08/08/2013, 12:27 PM Carmelia Bake, PT, DPT 08/08/2013 Pager: [phone]

## 2013-08-08 NOTE — Progress Notes (Signed)
Pt returned to [facility] today via [facility] transport. Pt/ family notified and were in agreement with d/c plan.  Werner Lean LCSW [phone]

## 2013-08-08 NOTE — Progress Notes (Signed)
[facility] PROGRESS NOTE  Ronald Sutton ZOX:[id number] DOB: [birth year] DOA: 08/05/2013 PCP: Cyndee Brightly, MD  Assessment/Plan: Right femoral neck fracture Status post-ORIF on 1/26. Stable postop Followup with [facility] as outpatient  Hypertension Continue Lotensin and amlodipine  Seizure disorder Continue Keppra  Dementia Advanced. He required a sitter initially but has not required any for past 24 hours Hyperkalemia Possibly hemolysis. Resolvedthis a.m.   Code Status: Full Family Communication: None at bedside  Disposition Plan: Return for skilled nursing facility   Consultants:  Orthopedics  Procedures:  ORIF on 1/26  Antibiotics:  none  HPI/Subjective: No overnight issues  Objective: Filed Vitals:   08/08/13 0500  BP: 136/72  Pulse: 85  Temp: 98.1 F (36.7 C)  Resp: 16    Intake/Output Summary (Last 24 hours) at 08/08/13 1040 Last data filed at 08/08/13 0908  Gross per 24 hour  Intake   1597 ml  Output    425 ml  Net   1172 ml   Filed Weights   08/06/13 2000  Weight: 63.8 kg (140 lb 10.5 oz)    Exam:   General:  Elderly male lying in bed in no acute distress  HEENT: No pallor, moist oral mucosa  Chest: Clear to auscultation bilaterally  CVS: Normal S1 and S2, no murmurs  Abdomen: Soft, nontender, nondistended, bowel sounds present  Extremities: dressing over right hip  CNS: AAO x0    Data Reviewed: Basic Metabolic Panel:  Recent Labs Lab 08/05/13 0425 08/06/13 0501 08/07/13 0512 08/08/13 0355  NA 132* 135* 133* 131*  K 4.9 4.7 5.5* 4.0  CL 94* 96 94* 96  CO2 29 26 21 25   GLUCOSE 110* 102* 88 120*  BUN 15 11 11 16   CREATININE 0.88 0.85 0.73 0.87  CALCIUM 9.5 9.4 9.2 8.6   Liver Function Tests: No results found for this basename: AST, ALT, ALKPHOS, BILITOT, PROT, ALBUMIN,  in the last 168 hours No results found for this basename: LIPASE, AMYLASE,  in the last 168 hours No results found for this basename:  AMMONIA,  in the last 168 hours CBC:  Recent Labs Lab 08/05/13 0425 08/06/13 0501 08/07/13 0512 08/08/13 0355  WBC 8.9 8.3 12.4* 8.9  NEUTROABS 6.5  --   --   --   HGB 12.9* 12.5* 13.2 10.7*  HCT 39.0 37.7* 38.6* 32.8*  MCV 86.9 85.7 83.5 85.9  PLT 286 291 131* 264   Cardiac Enzymes: No results found for this basename: CKTOTAL, CKMB, CKMBINDEX, TROPONINI,  in the last 168 hours BNP (last 3 results) No results found for this basename: PROBNP,  in the last 8760 hours CBG:  Recent Labs Lab 08/05/13 2101 08/06/13 0720 08/07/13 0135 08/07/13 0730 08/07/13 1125  GLUCAP 113* 113* 117* 87 125*    Recent Results (from the past 240 hour(s))  SURGICAL PCR SCREEN     Status: Abnormal   Collection Time    08/05/13  6:50 PM      Result Value Range Status   MRSA, PCR POSITIVE (*) NEGATIVE Final   Comment: RESULT CALLED TO, READ BACK BY AND VERIFIED WITH:     CRUTCHFIELD,J RN @2105  ON 01.25.2015 BY MCREYNOLDS,B   Staphylococcus aureus POSITIVE (*) NEGATIVE Final   Comment:            The Xpert SA Assay (FDA     approved for NASAL specimens     in patients over 56 years of age),     is one component of  a comprehensive surveillance     program.  Test performance has     been validated by [facility] for patients greater     than or equal to 60 year old.     It is not intended     to diagnose infection nor to     guide or monitor treatment.     Studies: Dg Hip Operative Right  August 12, 2013   CLINICAL DATA:  Right hip subcapital femoral neck fracture  EXAM: DG OPERATIVE RIGHT HIP  COMPARISON:  08/05/2013  FINDINGS: Fixation screws noted through the right femoral neck stabilizing the subcapital femoral neck fracture. Anatomic alignment. Degenerative changes noted of the hip joint. No subluxation or dislocation. No complicating feature.  IMPRESSION: Status post ORIF with 3 fixation screws to stabilize the right femoral neck subcapital fracture   Electronically Signed   By:  Daryll Brod M.D.   On: 08-12-2013 02:58   Dg Pelvis Portable  08/06/2013   CLINICAL DATA:  Right hip subcapital femoral neck fracture, operative fixation  EXAM: PORTABLE PELVIS 1-2 VIEWS  COMPARISON:  08/05/2013  FINDINGS: 3 fixation screws reduced the right proximal femur subcapital femoral neck fracture. Anatomic alignment. Stable appearance. No malalignment. No hardware abnormality. Skin staples laterally.  IMPRESSION: Screw fixation of the right proximal femur subcapital femoral neck fracture. No complicating features   Electronically Signed   By: Daryll Brod M.D.   On: 08/06/2013 22:45    Scheduled Meds: . amLODipine  10 mg Oral Daily   And  . benazepril  20 mg Oral Daily  . aspirin EC  325 mg Oral Q breakfast  . Chlorhexidine Gluconate Cloth  6 each Topical Q0600  . docusate sodium  100 mg Oral BID  . levETIRAcetam  500 mg Oral BID  . mupirocin ointment  1 application Nasal BID  . senna  1 tablet Oral BID   Continuous Infusions: . sodium chloride 50 mL/hr at 08/08/13 0238  . sodium chloride    . lactated ringers Stopped (08/12/2013 0915)      Time spent:25 minutes    Kacy Hegna  [facility] Pager [phone]. If 7PM-7AM, please contact night-coverage at [email or web address], password [facility] 08/08/2013, 10:40 AM  LOS: 3 days

## 2013-08-09 NOTE — Anesthesia Postprocedure Evaluation (Signed)
  Anesthesia Post-op Note  Patient: Ronald Sutton  Procedure(s) Performed: Procedure(s) (LRB): CANNULATED HIP PINNING  (Right)  Patient Location: PACU  Anesthesia Type: General  Level of Consciousness: awake and alert   Airway and Oxygen Therapy: Patient Spontanous Breathing  Post-op Pain: mild  Post-op Assessment: Post-op Vital signs reviewed, Patient's Cardiovascular Status Stable, Respiratory Function Stable, Patent Airway and No signs of Nausea or vomiting  Last Vitals:  Filed Vitals:   08/08/13 1042  BP: 128/64  Pulse:   Temp:   Resp:     Post-op Vital Signs: stable   Complications: No apparent anesthesia complications

## 2013-08-14 ENCOUNTER — Non-Acute Institutional Stay (SKILLED_NURSING_FACILITY): Payer: Medicare Other | Admitting: Internal Medicine

## 2013-08-14 DIAGNOSIS — I10 Essential (primary) hypertension: Secondary | ICD-10-CM

## 2013-08-14 DIAGNOSIS — G40909 Epilepsy, unspecified, not intractable, without status epilepticus: Secondary | ICD-10-CM

## 2013-08-14 DIAGNOSIS — S72143A Displaced intertrochanteric fracture of unspecified femur, initial encounter for closed fracture: Secondary | ICD-10-CM

## 2013-08-14 DIAGNOSIS — S72141A Displaced intertrochanteric fracture of right femur, initial encounter for closed fracture: Secondary | ICD-10-CM

## 2013-08-14 NOTE — Progress Notes (Signed)
Patient ID: Ronald Sutton, male   DOB: [birth year], 80 y.o.   MRN: [id number] Facility; [facility] Chief complaint readmission to the facility post stay [place] 1/25 through 1/28  History; this is a man with the history history of  a remote stroke, seizure disorder and dementia. He has been living on the assisted living wing of this facility since at least the spring of 2014. He is apparently nonambulatory pushes himself around in a wheelchair. He apparently had been complaining of hip pain secondary to a fall perhaps as long as 2 weeks before he was actually admitted for this fracture. I don't have exact information or radiology followup that was done in the facility. In any case by the time he made it to the hospital he had a right hip fracture. This was surgically repaired by Dr. Ninfa Linden of orthopedics the patient required a sitter during the hospitalization.    TECHNIQUE: Contiguous axial images were obtained from the base of the skull through the vertex without intravenous contrast.   COMPARISON:  CT of the head August 06, 2009.   FINDINGS: Moderate to severe ventriculomegaly, likely on the basis of global parenchymal brain volume loss as there is commensurate enlargement of cerebral sulci and cerebellar folia, advanced. No intraparenchymal hemorrhage, mass effect nor midline shift. Confluent supratentorial white matter hypodensities are within normal range for patient's age and though non-specific suggest sequelae of chronic small vessel ischemic disease. No acute large vascular territory infarcts. Remote high right frontal cortical the subcortical infarct, present on prior examination.   No abnormal extra-axial fluid collections. Basal cisterns are patent. Moderate calcific atherosclerosis of the carotid siphons.   No skull fracture. Visualized paranasal sinuses and mastoid aircells are well-aerated. The included ocular globes and orbital contents are non-suspicious.    IMPRESSION: No acute intracranial process.   Moderate to severe global atrophy, unchanged.   Femoral high right frontal cortical/ subcortical infarct versus remote trauma. Severe white matter changes suggest chronic small vessel ischemic disease.     Electronically Signed   By: Elon Alas   On: 08/05/2013 06:34         Past Medical History  Diagnosis Date  . Hypertension   . Diabetes mellitus without complication   . CVA (cerebral infarcion)   y  Right-sided hemiparesis  . Seizures   . A-fib   . Anemia   . Arthritis     osteoarthritis  . Cancer     Oral cancer?   Past Surgical History  Procedure Laterality Date  . Cholecystectomy    . Hip pinning,cannulated Right 08/06/2013    Procedure: CANNULATED HIP PINNING ;  Surgeon: Mcarthur Rossetti, MD;  Location: [facility];  Service: Orthopedics;  Laterality: Right;    Current Outpatient Prescriptions on File Prior to Visit  Medication Sig Dispense Refill  . acetaminophen (TYLENOL) 325 MG tablet Take 2 tablets (650 mg total) by mouth every 6 (six) hours as needed for mild pain (or Fever >/= 101).  60 tablet  0  . amLODipine-benazepril (LOTREL) 10-20 MG per capsule Take 1 capsule by mouth daily.      Marland Kitchen aspirin 325 MG EC tablet Take 325 mg by mouth daily.      . cetirizine (ZYRTEC) 10 MG tablet Take 10 mg by mouth daily.      . fluticasone (FLONASE) 50 MCG/ACT nasal spray Place 1 spray into both nostrils daily. For allergies      . levETIRAcetam (KEPPRA) 500 MG tablet Take 500 mg  by mouth 2 (two) times daily.      . Multiple Vitamin (MULTI VITAMIN MENS) tablet Take 1 tablet by mouth daily.      . nitroGLYCERIN (NITROSTAT) 0.4 MG SL tablet Place 0.4 mg under the tongue every 5 (five) minutes as needed for chest pain.      Marland Kitchen sennosides-docusate sodium (SENOKOT-S) 8.6-50 MG tablet Take 1 tablet by mouth 2 (two) times daily.           Social History  there is no real information on this chart and there is no advanced  directive  Family history not available from any current source  Review of systems; Respiratory he is not complaining of shortness of breath but I note a cough Cardiac no clear chest pain Musculoskeletal complaining of pain in the right leg  Physical examination General patient is not in any distress Vital signs; O2 sat is 93% on room air pulse 103 respirations 18 Respiratory markedly reduced air entry bilaterally but no crackles or wheezes Cardiac there is a 2/6 pansystolic murmur over the mitral area that does not radiate. He appears to be euvolemic Abdomen no liver no spleen no tenderness GU no suprapubic tenderness Right leg no evidence of a DVT Neurologic no overt lateralizing signs his speech is somewhat dysarthric but understandable. Apparently he had weakness of the right leg I cannot really test this Mental status; this could be a multi-infarct state. He is able to tell me his date of birth however he seems unaware that he was previously in this building could not state the year  Impression/plan Right hip fracture this is been operatively repaired 2. One would wonder about his bone density status when 25-hydroxy vitamin D level. Seizure disorder on Keppra Late effects noninant hemisphere CVA this appears to be stable Dementia; exact cause of this is really unclear there is no collateral history Type 2 diabetes if this were a virtue he is not on anything and his hemoglobin A1c is 5.7 Hypertension on treatment

## 2013-08-21 ENCOUNTER — Non-Acute Institutional Stay (SKILLED_NURSING_FACILITY): Payer: Medicare Other | Admitting: Internal Medicine

## 2013-08-21 DIAGNOSIS — G40909 Epilepsy, unspecified, not intractable, without status epilepticus: Secondary | ICD-10-CM

## 2013-08-21 DIAGNOSIS — J309 Allergic rhinitis, unspecified: Secondary | ICD-10-CM

## 2013-08-21 DIAGNOSIS — D638 Anemia in other chronic diseases classified elsewhere: Secondary | ICD-10-CM

## 2013-08-21 DIAGNOSIS — I10 Essential (primary) hypertension: Secondary | ICD-10-CM

## 2013-08-21 NOTE — Progress Notes (Signed)
        PROGRESS NOTE  DATE: 08-21-13  FACILITY: [facility]  LEVEL OF CARE: SNF  Routine Visit  CHIEF COMPLAINT:  Manage anemia of chronic disease, seizure disorder, hypertension and allergic rhinitis  HISTORY OF PRESENT ILLNESS:  REASSESSMENT OF ONGOING PROBLEM(S):  ANEMIA: The anemia has been stable. The patient denies fatigue, melena or hematochezia. 08-18-13 hemoglobin 11.7, MCV is 87. In 1-15 hemoglobin 13.2.  HTN: Pt 's HTN remains stable.  Denies CP, sob, DOE, pedal edema, headaches, dizziness or visual disturbances.  No complications from the medications currently being used.  Last BP : 140/96, 122/62, 140/80, 136/68, 138/58, 140/80.  SEIZURE DISORDER: The patient's seizure disorder remains stable. No complications reported from the medications presently being used. Staff do not report any recent seizure activity.  ALLERGIC RHINITIS: Allergic rhinitis remains stable.  Patient denies ongoing symptoms such as runny nose sneezing or tearing. No complications reported from the current medication(s) being used.  PAST MEDICAL HISTORY : Reviewed.  No changes.  CURRENT MEDICATIONS: Reviewed per [facility]  REVIEW OF SYSTEMS:  GENERAL: no change in appetite, no fatigue, no weight changes, no fever, chills or weakness RESPIRATORY: no cough, SOB, DOE, wheezing, hemoptysis CARDIAC: no chest pain, edema or palpitations GI: no abdominal pain, diarrhea, constipation, heart burn, nausea or vomiting  PHYSICAL EXAMINATION  VS:  T 99.4       P 90     RR 20      BP 140/80     WT (Lb) 125  GENERAL: no acute distress, normal body habitus EYES: conjunctivae normal, sclerae normal, normal eye lids NECK: supple, trachea midline, no neck masses, no thyroid tenderness, no thyromegaly LYMPHATICS: no LAN in the neck, no supraclavicular LAN RESPIRATORY: breathing is even & unlabored, BS CTAB CARDIAC: RRR, no murmur,no extra heart sounds, no edema GI: abdomen soft, normal BS, no masses, no tenderness,  no hepatomegaly, no splenomegaly PSYCHIATRIC: the patient is alert & oriented to person, affect & behavior appropriate  LABS/RADIOLOGY: 2-15 hemoglobin 11.7, MCV 87, platelets 407 otherwise CBC normal, CMP normal 11-14 hemoglobin 12, MCV 85 otherwise CBC normal, CMP normal, hemoglobin A1c 5.9  7/14 FLP nl 5/14 cbc & cmp nl, HbA1c 5.8  2/14 BMP normal 1/14 hemoglobin 12 MCV 87 otherwise CBC normal, glucose 131 otherwise CMP normal 11/13 hemoglobin A1c 5.8  ASSESSMENT/PLAN:  Anemia of chronic disease-hemoglobin declined. Will monitor. seizure disorder-well controlled. hypertension-stable. allergic rhinitis-well-controlled. diabetes mellitus-diet controlled.  atrial fibrillation-rate controlled. 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 Continue supportive care. osteoarthritis-denies ongoing pain.  CPT CODE: [id number]

## 2013-09-18 ENCOUNTER — Non-Acute Institutional Stay (SKILLED_NURSING_FACILITY): Payer: Medicare Other | Admitting: Family

## 2013-09-18 DIAGNOSIS — I10 Essential (primary) hypertension: Secondary | ICD-10-CM

## 2013-09-18 DIAGNOSIS — G40909 Epilepsy, unspecified, not intractable, without status epilepticus: Secondary | ICD-10-CM

## 2013-09-18 DIAGNOSIS — M1611 Unilateral primary osteoarthritis, right hip: Secondary | ICD-10-CM

## 2013-09-18 DIAGNOSIS — M1610 Unilateral primary osteoarthritis, unspecified hip: Secondary | ICD-10-CM

## 2013-09-18 DIAGNOSIS — M169 Osteoarthritis of hip, unspecified: Secondary | ICD-10-CM

## 2013-09-18 DIAGNOSIS — J309 Allergic rhinitis, unspecified: Secondary | ICD-10-CM

## 2013-09-26 ENCOUNTER — Encounter: Payer: Self-pay | Admitting: Family

## 2013-09-26 NOTE — Progress Notes (Signed)
Patient ID: Ronald Sutton, male   DOB: Aug 25, 1930, 80 y.o.   MRN: [id number]  Date: 09/18/13 Facility: [facility]   Chief Complaint  Patient presents with  . Medical Managment of Chronic Issues    Routine Visit    HPI: Pt  Is being followed for the medical management of chronic illnesses. Pt and health care team denies further issues/concerns.      No Known Allergies   Medication List       This list is accurate as of: 09/18/13 11:59 PM.  Always use your most recent med list.               acetaminophen 325 MG tablet  Commonly known as:  TYLENOL  Take 2 tablets (650 mg total) by mouth every 6 (six) hours as needed for mild pain (or Fever >/= 101).     amLODipine-benazepril 10-20 MG per capsule  Commonly known as:  LOTREL  Take 1 capsule by mouth daily.     aspirin 325 MG EC tablet  Take 325 mg by mouth daily.     cetirizine 10 MG tablet  Commonly known as:  ZYRTEC  Take 10 mg by mouth daily.     cetirizine 10 MG tablet  Commonly known as:  ZYRTEC  Take 10 mg by mouth daily.     fluticasone 50 MCG/ACT nasal spray  Commonly known as:  FLONASE  Place 1 spray into both nostrils daily. For allergies     levETIRAcetam 500 MG tablet  Commonly known as:  KEPPRA  Take 500 mg by mouth 2 (two) times daily.     MULTI VITAMIN MENS tablet  Take 1 tablet by mouth daily.     nitroGLYCERIN 0.4 MG SL tablet  Commonly known as:  NITROSTAT  Place 0.4 mg under the tongue every 5 (five) minutes as needed for chest pain.     sennosides-docusate sodium 8.6-50 MG tablet  Commonly known as:  SENOKOT-S  Take 1 tablet by mouth 2 (two) times daily.         DATA REVIEWED  Laboratory Studies: 08/17/13-WBC 7.5, Hemoglobin 11.7, Hematocrit 36.2, Platelets 407, Na 133, K 4.4, Cl 92, Ca 9.6,     Past Medical History  Diagnosis Date  . Hypertension   . Diabetes mellitus without complication   . CVA (cerebral infarction)     Right-sided hemiparesis  . Seizures   . A-fib   .  Anemia   . Arthritis     osteoarthritis  . Cancer     Oral cancer?   Past Surgical History  Procedure Laterality Date  . Cholecystectomy    . Hip pinning,cannulated Right 08/06/2013    Procedure: CANNULATED HIP PINNING ;  Surgeon: Mcarthur Rossetti, MD;  Location: [facility];  Service: Orthopedics;  Laterality: Right;    Review of Systems  Constitutional: Negative.   HENT: Negative.   Respiratory: Negative.   Cardiovascular: Negative.   Gastrointestinal: Negative.   Genitourinary: Negative.   Musculoskeletal: Positive for joint pain.  Skin: Negative.   Neurological: Negative.   Psychiatric/Behavioral: Negative.      Physical Exam Filed Vitals:   09/18/13 1413  BP: 140/68  Pulse: 80  Temp: 97.1 F (36.2 C)  Resp: 18  SpO2: 96%   There is no weight on file to calculate BMI. Physical Exam  Cardiovascular: Normal rate and regular rhythm.   Pulmonary/Chest: Effort normal and breath sounds normal.  Musculoskeletal:  Self propels in wheelchair  Neurological: He is alert. GCS eye subscore is 4. GCS verbal subscore is 4. GCS motor subscore is 6.  Short term memory deficits    ASSESSMENT/PLAN  HTN-continue Lotrel/pt is currently stable on treatment regimen CAD-will contin ASA for  prophylaxis Allergic Rhinitis- will continue flonase and zyrtec;; pt is stable/ continue current treatment regimen Seizures-will continue Keppra; pt is stable on treatment regimen Unspecified Constipation-will continue Senna-S  Moderate Pain-will continue Acetaminophen for mod pain/fever prn Stable Angina-will continue Nitrostat SL  Prn for Angina; notifiy PCP if unrelieved after 2 doses  Follow up:prn

## 2013-12-05 ENCOUNTER — Non-Acute Institutional Stay (SKILLED_NURSING_FACILITY): Payer: Medicare Other | Admitting: Internal Medicine

## 2013-12-05 DIAGNOSIS — D638 Anemia in other chronic diseases classified elsewhere: Secondary | ICD-10-CM

## 2013-12-05 DIAGNOSIS — I10 Essential (primary) hypertension: Secondary | ICD-10-CM

## 2013-12-05 DIAGNOSIS — G40909 Epilepsy, unspecified, not intractable, without status epilepticus: Secondary | ICD-10-CM

## 2013-12-05 DIAGNOSIS — J309 Allergic rhinitis, unspecified: Secondary | ICD-10-CM

## 2013-12-06 NOTE — Progress Notes (Signed)
        PROGRESS NOTE  DATE: 12-05-13  FACILITY: [facility]  LEVEL OF CARE: SNF  Routine Visit  CHIEF COMPLAINT:  Manage anemia of chronic disease, seizure disorder, hypertension and allergic rhinitis  HISTORY OF PRESENT ILLNESS:  REASSESSMENT OF ONGOING PROBLEM(S):  ANEMIA: The anemia has been stable. The patient denies fatigue, melena or hematochezia. 08-18-13 hemoglobin 11.7, MCV is 87. In 1-15 hemoglobin 13.2.  HTN: Pt 's HTN remains stable.  Denies CP, sob, DOE, pedal edema, headaches, dizziness or visual disturbances.  No complications from the medications currently being used.  Last BP : 140/96, 122/62, 140/80, 136/68, 138/58, 140/80, 138/76.  SEIZURE DISORDER: The patient's seizure disorder remains stable. No complications reported from the medications presently being used. Staff do not report any recent seizure activity.  ALLERGIC RHINITIS: Allergic rhinitis remains stable.  Patient denies ongoing symptoms such as runny nose sneezing or tearing. No complications reported from the current medication(s) being used.  PAST MEDICAL HISTORY : Reviewed.  No changes.  CURRENT MEDICATIONS: Reviewed per [facility]  REVIEW OF SYSTEMS:  GENERAL: no change in appetite, no fatigue, no weight changes, no fever, chills or weakness RESPIRATORY: no cough, SOB, DOE, wheezing, hemoptysis CARDIAC: no chest pain, edema or palpitations GI: no abdominal pain, diarrhea, constipation, heart burn, nausea or vomiting  PHYSICAL EXAMINATION  VS:  See VS Section  GENERAL: no acute distress, normal body habitus EYES: conjunctivae normal, sclerae normal, normal eye lids NECK: supple, trachea midline, no neck masses, no thyroid tenderness, no thyromegaly LYMPHATICS: no LAN in the neck, no supraclavicular LAN RESPIRATORY: breathing is even & unlabored, BS CTAB CARDIAC: RRR, no murmur,no extra heart sounds, no edema GI: abdomen soft, normal BS, no masses, no tenderness, no hepatomegaly, no  splenomegaly PSYCHIATRIC: the patient is alert & oriented to person, affect & behavior appropriate  LABS/RADIOLOGY: 2-15 hemoglobin 11.7, MCV 87, platelets 407 otherwise CBC normal, CMP normal 11-14 hemoglobin 12, MCV 85 otherwise CBC normal, CMP normal, hemoglobin A1c 5.9  7/14 FLP nl 5/14 cbc & cmp nl, HbA1c 5.8  2/14 BMP normal 1/14 hemoglobin 12 MCV 87 otherwise CBC normal, glucose 131 otherwise CMP normal 11/13 hemoglobin A1c 5.8  ASSESSMENT/PLAN:  Anemia of chronic disease-hemoglobin declined. Will monitor. seizure disorder-well controlled. hypertension-stable. allergic rhinitis-well-controlled. diabetes mellitus-diet controlled. Check hemoglobin A1c atrial fibrillation-rate controlled. 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 Continue supportive care. osteoarthritis-denies ongoing pain.  CPT CODE: [id number]  Gayani [name], [place] [phone]

## 2013-12-24 ENCOUNTER — Non-Acute Institutional Stay (SKILLED_NURSING_FACILITY): Payer: Medicare Other | Admitting: Internal Medicine

## 2013-12-24 DIAGNOSIS — G40909 Epilepsy, unspecified, not intractable, without status epilepticus: Secondary | ICD-10-CM

## 2013-12-24 DIAGNOSIS — I10 Essential (primary) hypertension: Secondary | ICD-10-CM

## 2013-12-24 DIAGNOSIS — D638 Anemia in other chronic diseases classified elsewhere: Secondary | ICD-10-CM

## 2013-12-24 DIAGNOSIS — J309 Allergic rhinitis, unspecified: Secondary | ICD-10-CM

## 2013-12-24 NOTE — Progress Notes (Signed)
        PROGRESS NOTE  DATE: 12-24-13  FACILITY: [facility]  LEVEL OF CARE: SNF  Routine Visit  CHIEF COMPLAINT:  Manage anemia of chronic disease, seizure disorder, hypertension and allergic rhinitis  HISTORY OF PRESENT ILLNESS:  REASSESSMENT OF ONGOING PROBLEM(S):  ANEMIA: The anemia has been stable. The patient denies fatigue, melena or hematochezia. 08-18-13 hemoglobin 11.7, MCV is 87. In 1-15 hemoglobin 13.2.  HTN: Pt 's HTN remains stable.  Denies CP, sob, DOE, pedal edema, headaches, dizziness or visual disturbances.  No complications from the medications currently being used.  Last BP : 140/96, 122/62, 140/80, 136/68, 138/58, 140/80, 138/76, 120/76.  SEIZURE DISORDER: The patient's seizure disorder remains stable. No complications reported from the medications presently being used. Staff do not report any recent seizure activity.  ALLERGIC RHINITIS: Allergic rhinitis remains stable.  Patient denies ongoing symptoms such as runny nose sneezing or tearing. No complications reported from the current medication(s) being used.  PAST MEDICAL HISTORY : Reviewed.  No changes.  CURRENT MEDICATIONS: Reviewed per [facility]  REVIEW OF SYSTEMS:  GENERAL: no change in appetite, no fatigue, no weight changes, no fever, chills or weakness RESPIRATORY: no cough, SOB, DOE, wheezing, hemoptysis CARDIAC: no chest pain, edema or palpitations GI: no abdominal pain, diarrhea, constipation, heart burn, nausea or vomiting  PHYSICAL EXAMINATION  VS:  See VS Section  GENERAL: no acute distress, normal body habitus NECK: supple, trachea midline, no neck masses, no thyroid tenderness, no thyromegaly RESPIRATORY: breathing is even & unlabored, BS CTAB CARDIAC: RRR, no murmur,no extra heart sounds, no edema GI: abdomen soft, normal BS, no masses, no tenderness, no hepatomegaly, no splenomegaly PSYCHIATRIC: the patient is alert & oriented to person, affect & behavior appropriate  LABS/RADIOLOGY: 5-15  hemoglobin A1c 5.7 2-15 hemoglobin 11.7, MCV 87, platelets 407 otherwise CBC normal, CMP normal 11-14 hemoglobin 12, MCV 85 otherwise CBC normal, CMP normal, hemoglobin A1c 5.9  7/14 FLP nl 5/14 cbc & cmp nl, HbA1c 5.8  2/14 BMP normal 1/14 hemoglobin 12 MCV 87 otherwise CBC normal, glucose 131 otherwise CMP normal 11/13 hemoglobin A1c 5.8  ASSESSMENT/PLAN:  Anemia of chronic disease-hemoglobin declined. Will monitor. seizure disorder-well controlled. hypertension-stable. allergic rhinitis-well-controlled. diabetes mellitus-diet controlled. atrial fibrillation-rate controlled. 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 Continue supportive care. osteoarthritis-denies ongoing pain.  CPT CODE: [id number]  Gayani [name], [place] [phone]

## 2014-01-17 ENCOUNTER — Non-Acute Institutional Stay (SKILLED_NURSING_FACILITY): Payer: Medicare Other | Admitting: Internal Medicine

## 2014-01-17 DIAGNOSIS — I10 Essential (primary) hypertension: Secondary | ICD-10-CM

## 2014-01-17 DIAGNOSIS — D638 Anemia in other chronic diseases classified elsewhere: Secondary | ICD-10-CM

## 2014-01-17 DIAGNOSIS — J309 Allergic rhinitis, unspecified: Secondary | ICD-10-CM

## 2014-01-17 DIAGNOSIS — G40909 Epilepsy, unspecified, not intractable, without status epilepticus: Secondary | ICD-10-CM

## 2014-01-17 NOTE — Progress Notes (Signed)
        PROGRESS NOTE  DATE: 01-17-14  FACILITY: [facility]  LEVEL OF CARE: SNF  Routine Visit  CHIEF COMPLAINT:  Manage anemia of chronic disease, seizure disorder, hypertension and allergic rhinitis  HISTORY OF PRESENT ILLNESS:  REASSESSMENT OF ONGOING PROBLEM(S):  ANEMIA: The anemia has been stable. The patient denies fatigue, melena or hematochezia. 08-18-13 hemoglobin 11.7, MCV is 87. In 1-15 hemoglobin 13.2.  HTN: Pt 's HTN remains stable.  Denies CP, sob, DOE, pedal edema, headaches, dizziness or visual disturbances.  No complications from the medications currently being used.  Last BP : 140/96, 122/62, 140/80, 136/68, 138/58, 140/80, 138/76, 120/76, 124/78.  SEIZURE DISORDER: The patient's seizure disorder remains stable. No complications reported from the medications presently being used. Staff do not report any recent seizure activity.  ALLERGIC RHINITIS: Allergic rhinitis remains stable.  Patient denies ongoing symptoms such as runny nose sneezing or tearing. No complications reported from the current medication(s) being used.  PAST MEDICAL HISTORY : Reviewed.  No changes.  CURRENT MEDICATIONS: Reviewed per [facility]  REVIEW OF SYSTEMS:  GENERAL: no change in appetite, no fatigue, no weight changes, no fever, chills or weakness RESPIRATORY: no cough, SOB, DOE, wheezing, hemoptysis CARDIAC: no chest pain, edema or palpitations GI: no abdominal pain, diarrhea, constipation, heart burn, nausea or vomiting  PHYSICAL EXAMINATION  VS:  See VS Section  GENERAL: no acute distress, normal body habitus NECK: supple, trachea midline, no neck masses, no thyroid tenderness, no thyromegaly RESPIRATORY: breathing is even & unlabored, BS CTAB CARDIAC: RRR, no murmur,no extra heart sounds, no edema GI: abdomen soft, normal BS, no masses, no tenderness, no hepatomegaly, no splenomegaly PSYCHIATRIC: the patient is alert & oriented to person, affect & behavior  appropriate  LABS/RADIOLOGY: 5-15 hemoglobin A1c 5.7 2-15 hemoglobin 11.7, MCV 87, platelets 407 otherwise CBC normal, CMP normal 11-14 hemoglobin 12, MCV 85 otherwise CBC normal, CMP normal, hemoglobin A1c 5.9  7/14 FLP nl 5/14 cbc & cmp nl, HbA1c 5.8  2/14 BMP normal 1/14 hemoglobin 12 MCV 87 otherwise CBC normal, glucose 131 otherwise CMP normal 11/13 hemoglobin A1c 5.8  ASSESSMENT/PLAN:  Anemia of chronic disease-hemoglobin declined. Will monitor. seizure disorder-well controlled. hypertension-stable. allergic rhinitis-well-controlled. diabetes mellitus-diet controlled. atrial fibrillation-rate controlled. 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 Continue supportive care. osteoarthritis-denies ongoing pain.  CPT CODE: [id number]  Ronald Sutton Y. Ronald Sutton, Ronald Sutton [phone]

## 2014-03-04 ENCOUNTER — Non-Acute Institutional Stay (SKILLED_NURSING_FACILITY): Payer: Medicare Other | Admitting: Internal Medicine

## 2014-03-04 DIAGNOSIS — G40909 Epilepsy, unspecified, not intractable, without status epilepticus: Secondary | ICD-10-CM

## 2014-03-04 DIAGNOSIS — I10 Essential (primary) hypertension: Secondary | ICD-10-CM

## 2014-03-04 DIAGNOSIS — J309 Allergic rhinitis, unspecified: Secondary | ICD-10-CM

## 2014-03-04 DIAGNOSIS — D638 Anemia in other chronic diseases classified elsewhere: Secondary | ICD-10-CM

## 2014-03-04 NOTE — Progress Notes (Signed)
        PROGRESS NOTE  DATE: 03-04-14  FACILITY: [facility]  LEVEL OF CARE: SNF  Routine Visit  CHIEF COMPLAINT:  Manage anemia of chronic disease, seizure disorder, hypertension and allergic rhinitis  HISTORY OF PRESENT ILLNESS:  REASSESSMENT OF ONGOING PROBLEM(S):  ANEMIA: The anemia has been stable. The patient denies fatigue, melena or hematochezia. 08-18-13 hemoglobin 11.7, MCV is 87. In 1-15 hemoglobin 13.2.  HTN: Pt 's HTN remains stable.  Denies CP, sob, DOE, pedal edema, headaches, dizziness or visual disturbances.  No complications from the medications currently being used.  Last BP : 140/96, 122/62, 140/80, 136/68, 138/58, 140/80, 138/76, 120/76, 124/78, 145/94.  SEIZURE DISORDER: The patient's seizure disorder remains stable. No complications reported from the medications presently being used. Staff do not report any recent seizure activity.  ALLERGIC RHINITIS: Allergic rhinitis remains stable.  Patient denies ongoing symptoms such as runny nose sneezing or tearing. No complications reported from the current medication(s) being used.  PAST MEDICAL HISTORY : Reviewed.  No changes.  CURRENT MEDICATIONS: Reviewed per [facility]  REVIEW OF SYSTEMS:  GENERAL: no change in appetite, no fatigue, no weight changes, no fever, chills or weakness RESPIRATORY: no cough, SOB, DOE, wheezing, hemoptysis CARDIAC: no chest pain, edema or palpitations GI: no abdominal pain, diarrhea, constipation, heart burn, nausea or vomiting  PHYSICAL EXAMINATION  VS:  See VS Section  GENERAL: no acute distress, normal body habitus NECK: supple, trachea midline, no neck masses, no thyroid tenderness, no thyromegaly RESPIRATORY: breathing is even & unlabored, BS CTAB CARDIAC: RRR, no murmur,no extra heart sounds, no edema GI: abdomen soft, normal BS, no masses, no tenderness, no hepatomegaly, no splenomegaly PSYCHIATRIC: the patient is alert & oriented to person, affect & behavior  appropriate  LABS/RADIOLOGY: 7-15 fasting lipid panel normal 5-15 hemoglobin A1c 5.7 2-15 hemoglobin 11.7, MCV 87, platelets 407 otherwise CBC normal, CMP normal 11-14 hemoglobin 12, MCV 85 otherwise CBC normal, CMP normal, hemoglobin A1c 5.9  7/14 FLP nl 5/14 cbc & cmp nl, HbA1c 5.8  2/14 BMP normal 1/14 hemoglobin 12 MCV 87 otherwise CBC normal, glucose 131 otherwise CMP normal 11/13 hemoglobin A1c 5.8  ASSESSMENT/PLAN:  Anemia of chronic disease-hemoglobin declined. Will monitor. Check hemoglobin. seizure disorder-well controlled. hypertension-blood pressure borderline. Will monitor. allergic rhinitis-well-controlled. diabetes mellitus-diet controlled. atrial fibrillation-rate controlled. 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 Continue supportive care. osteoarthritis-denies ongoing pain. Check CBC and CMP  CPT CODE: [id number]  Edgar Frisk. Durwin Reges, [place] [phone]

## 2014-04-01 ENCOUNTER — Non-Acute Institutional Stay (SKILLED_NURSING_FACILITY): Payer: Medicare Other | Admitting: Internal Medicine

## 2014-04-01 DIAGNOSIS — G40909 Epilepsy, unspecified, not intractable, without status epilepticus: Secondary | ICD-10-CM

## 2014-04-01 DIAGNOSIS — D638 Anemia in other chronic diseases classified elsewhere: Secondary | ICD-10-CM

## 2014-04-01 DIAGNOSIS — J309 Allergic rhinitis, unspecified: Secondary | ICD-10-CM

## 2014-04-01 DIAGNOSIS — I10 Essential (primary) hypertension: Secondary | ICD-10-CM

## 2014-04-02 NOTE — Progress Notes (Signed)
        PROGRESS NOTE  DATE: 04-01-14  FACILITY: [facility]  LEVEL OF CARE: SNF  Routine Visit  CHIEF COMPLAINT:  Manage anemia of chronic disease, seizure disorder, hypertension and allergic rhinitis  HISTORY OF PRESENT ILLNESS:  REASSESSMENT OF ONGOING PROBLEM(S):  ANEMIA: The anemia has been stable. The patient denies fatigue, melena or hematochezia. 08-18-13 hemoglobin 11.7, MCV is 87. In 1-15 hemoglobin 13.2, in 8-15 hemoglobin 11.8, MCV 85.  HTN: Pt 's HTN remains stable.  Denies CP, sob, DOE, pedal edema, headaches, dizziness or visual disturbances.  No complications from the medications currently being used.  Last BP : 140/96, 122/62, 140/80, 136/68, 138/58, 140/80, 138/76, 120/76, 124/78, 145/94, 108/66.  SEIZURE DISORDER: The patient's seizure disorder remains stable. No complications reported from the medications presently being used. Staff do not report any recent seizure activity.  ALLERGIC RHINITIS: Allergic rhinitis remains stable.  Patient denies ongoing symptoms such as runny nose sneezing or tearing. No complications reported from the current medication(s) being used.  PAST MEDICAL HISTORY : Reviewed.  No changes.  CURRENT MEDICATIONS: Reviewed per [facility]  REVIEW OF SYSTEMS:  GENERAL: no change in appetite, no fatigue, no weight changes, no fever, chills or weakness RESPIRATORY: no cough, SOB, DOE, wheezing, hemoptysis CARDIAC: no chest pain, edema or palpitations GI: no abdominal pain, diarrhea, constipation, heart burn, nausea or vomiting  PHYSICAL EXAMINATION  VS:  See VS Section  GENERAL: no acute distress, normal body habitus NECK: supple, trachea midline, no neck masses, no thyroid tenderness, no thyromegaly RESPIRATORY: breathing is even & unlabored, BS CTAB CARDIAC: RRR, no murmur,no extra heart sounds, no edema GI: abdomen soft, normal BS, no masses, no tenderness, no hepatomegaly, no splenomegaly PSYCHIATRIC: the patient is alert & oriented to  person, affect & behavior appropriate  LABS/RADIOLOGY: 8-15 hemoglobin 11.8, MCV 85 otherwise CBC normal, CMP normal 7-15 fasting lipid panel normal 5-15 hemoglobin A1c 5.7 2-15 hemoglobin 11.7, MCV 87, platelets 407 otherwise CBC normal, CMP normal 11-14 hemoglobin 12, MCV 85 otherwise CBC normal, CMP normal, hemoglobin A1c 5.9  7/14 FLP nl 5/14 cbc & cmp nl, HbA1c 5.8  2/14 BMP normal 1/14 hemoglobin 12 MCV 87 otherwise CBC normal, glucose 131 otherwise CMP normal 11/13 hemoglobin A1c 5.8  ASSESSMENT/PLAN:  Anemia of chronic disease-hemoglobin declined. Will monitor.  seizure disorder-well controlled. hypertension-blood pressure borderline. Will monitor. allergic rhinitis-well-controlled. diabetes mellitus-diet controlled. atrial fibrillation-rate controlled. 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 Continue supportive care. osteoarthritis-denies ongoing pain  CPT CODE: [id number]  Edgar Frisk. Durwin Reges, [place] [phone]

## 2014-05-01 ENCOUNTER — Non-Acute Institutional Stay (SKILLED_NURSING_FACILITY): Payer: Medicare Other | Admitting: Internal Medicine

## 2014-05-01 DIAGNOSIS — D638 Anemia in other chronic diseases classified elsewhere: Secondary | ICD-10-CM

## 2014-05-01 DIAGNOSIS — J309 Allergic rhinitis, unspecified: Secondary | ICD-10-CM

## 2014-05-01 DIAGNOSIS — G40909 Epilepsy, unspecified, not intractable, without status epilepticus: Secondary | ICD-10-CM

## 2014-05-01 DIAGNOSIS — I10 Essential (primary) hypertension: Secondary | ICD-10-CM

## 2014-05-04 DIAGNOSIS — D638 Anemia in other chronic diseases classified elsewhere: Secondary | ICD-10-CM | POA: Insufficient documentation

## 2014-05-04 NOTE — Progress Notes (Signed)
        PROGRESS NOTE  DATE: 05-01-14  FACILITY: [facility]  LEVEL OF CARE: SNF  Routine Visit  CHIEF COMPLAINT:  Manage anemia of chronic disease, seizure disorder, hypertension and allergic rhinitis  HISTORY OF PRESENT ILLNESS:  REASSESSMENT OF ONGOING PROBLEM(S):  ANEMIA: The anemia has been stable. The patient denies fatigue, melena or hematochezia. 08-18-13 hemoglobin 11.7, MCV is 87. In 1-15 hemoglobin 13.2, in 8-15 hemoglobin 11.8, MCV 85.  HTN: Pt 's HTN remains stable.  Denies CP, sob, DOE, pedal edema, headaches, dizziness or visual disturbances.  No complications from the medications currently being used.  Last BP : 140/96, 122/62, 140/80, 136/68, 138/58, 140/80, 138/76, 120/76, 124/78, 145/94, 108/66, 116/76.  SEIZURE DISORDER: The patient's seizure disorder remains stable. No complications reported from the medications presently being used. Staff do not report any recent seizure activity.  ALLERGIC RHINITIS: Allergic rhinitis remains stable.  Patient denies ongoing symptoms such as runny nose sneezing or tearing. No complications reported from the current medication(s) being used.  PAST MEDICAL HISTORY : Reviewed.  No changes.  CURRENT MEDICATIONS: Reviewed per [facility]  REVIEW OF SYSTEMS:  GENERAL: no change in appetite, no fatigue, no weight changes, no fever, chills or weakness RESPIRATORY: no cough, SOB, DOE, wheezing, hemoptysis CARDIAC: no chest pain, edema or palpitations GI: no abdominal pain, diarrhea, constipation, heart burn, nausea or vomiting  PHYSICAL EXAMINATION  VS:  See VS Section  GENERAL: no acute distress, normal body habitus NECK: supple, trachea midline, no neck masses, no thyroid tenderness, no thyromegaly RESPIRATORY: breathing is even & unlabored, BS CTAB CARDIAC: RRR, no murmur,no extra heart sounds, no edema GI: abdomen soft, normal BS, no masses, no tenderness, no hepatomegaly, no splenomegaly PSYCHIATRIC: the patient is alert & oriented  to person, affect & behavior appropriate  LABS/RADIOLOGY: 8-15 hemoglobin 11.8, MCV 85 otherwise CBC normal, CMP normal 7-15 fasting lipid panel normal 5-15 hemoglobin A1c 5.7 2-15 hemoglobin 11.7, MCV 87, platelets 407 otherwise CBC normal, CMP normal 11-14 hemoglobin 12, MCV 85 otherwise CBC normal, CMP normal, hemoglobin A1c 5.9  7/14 FLP nl 5/14 cbc & cmp nl, HbA1c 5.8  2/14 BMP normal 1/14 hemoglobin 12 MCV 87 otherwise CBC normal, glucose 131 otherwise CMP normal 11/13 hemoglobin A1c 5.8  ASSESSMENT/PLAN:  Anemia of chronic disease-hemoglobin declined. Will monitor.  seizure disorder-well controlled. hypertension-blood pressure borderline. Will monitor. allergic rhinitis-well-controlled. diabetes mellitus-diet controlled. atrial fibrillation-rate controlled. 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 Continue supportive care. osteoarthritis-denies ongoing pain  CPT CODE: [id number]  Edgar Frisk. Durwin Reges, [place] [phone]

## 2014-09-23 ENCOUNTER — Emergency Department (HOSPITAL_COMMUNITY): Payer: Medicare Other

## 2014-09-23 ENCOUNTER — Other Ambulatory Visit (HOSPITAL_COMMUNITY): Payer: Self-pay

## 2014-09-23 ENCOUNTER — Inpatient Hospital Stay (HOSPITAL_COMMUNITY)
Admission: EM | Admit: 2014-09-23 | Discharge: 2014-09-30 | DRG: 177 | Disposition: A | Discharge status: Hospice | Payer: Medicare Other | Attending: Internal Medicine | Admitting: Internal Medicine

## 2014-09-23 ENCOUNTER — Encounter (HOSPITAL_COMMUNITY): Payer: Self-pay | Admitting: Emergency Medicine

## 2014-09-23 DIAGNOSIS — E119 Type 2 diabetes mellitus without complications: Secondary | ICD-10-CM | POA: Diagnosis present

## 2014-09-23 DIAGNOSIS — F0390 Unspecified dementia without behavioral disturbance: Secondary | ICD-10-CM | POA: Diagnosis not present

## 2014-09-23 DIAGNOSIS — I672 Cerebral atherosclerosis: Secondary | ICD-10-CM | POA: Diagnosis present

## 2014-09-23 DIAGNOSIS — I959 Hypotension, unspecified: Secondary | ICD-10-CM | POA: Diagnosis not present

## 2014-09-23 DIAGNOSIS — G8191 Hemiplegia, unspecified affecting right dominant side: Secondary | ICD-10-CM | POA: Diagnosis present

## 2014-09-23 DIAGNOSIS — Y95 Nosocomial condition: Secondary | ICD-10-CM | POA: Diagnosis present

## 2014-09-23 DIAGNOSIS — I129 Hypertensive chronic kidney disease with stage 1 through stage 4 chronic kidney disease, or unspecified chronic kidney disease: Secondary | ICD-10-CM | POA: Diagnosis present

## 2014-09-23 DIAGNOSIS — J690 Pneumonitis due to inhalation of food and vomit: Secondary | ICD-10-CM | POA: Diagnosis present

## 2014-09-23 DIAGNOSIS — N189 Chronic kidney disease, unspecified: Secondary | ICD-10-CM | POA: Diagnosis present

## 2014-09-23 DIAGNOSIS — D638 Anemia in other chronic diseases classified elsewhere: Secondary | ICD-10-CM | POA: Diagnosis present

## 2014-09-23 DIAGNOSIS — Z23 Encounter for immunization: Secondary | ICD-10-CM

## 2014-09-23 DIAGNOSIS — M1990 Unspecified osteoarthritis, unspecified site: Secondary | ICD-10-CM | POA: Diagnosis present

## 2014-09-23 DIAGNOSIS — R627 Adult failure to thrive: Secondary | ICD-10-CM | POA: Diagnosis present

## 2014-09-23 DIAGNOSIS — Z79899 Other long term (current) drug therapy: Secondary | ICD-10-CM

## 2014-09-23 DIAGNOSIS — I480 Paroxysmal atrial fibrillation: Secondary | ICD-10-CM | POA: Diagnosis present

## 2014-09-23 DIAGNOSIS — G40909 Epilepsy, unspecified, not intractable, without status epilepticus: Secondary | ICD-10-CM | POA: Diagnosis present

## 2014-09-23 DIAGNOSIS — Z515 Encounter for palliative care: Secondary | ICD-10-CM | POA: Diagnosis present

## 2014-09-23 DIAGNOSIS — Z681 Body mass index (BMI) 19 or less, adult: Secondary | ICD-10-CM

## 2014-09-23 DIAGNOSIS — Z8673 Personal history of transient ischemic attack (TIA), and cerebral infarction without residual deficits: Secondary | ICD-10-CM | POA: Diagnosis not present

## 2014-09-23 DIAGNOSIS — R1310 Dysphagia, unspecified: Secondary | ICD-10-CM

## 2014-09-23 DIAGNOSIS — Z7982 Long term (current) use of aspirin: Secondary | ICD-10-CM

## 2014-09-23 DIAGNOSIS — E43 Unspecified severe protein-calorie malnutrition: Secondary | ICD-10-CM | POA: Diagnosis present

## 2014-09-23 DIAGNOSIS — F0150 Vascular dementia without behavioral disturbance: Secondary | ICD-10-CM | POA: Diagnosis present

## 2014-09-23 DIAGNOSIS — I10 Essential (primary) hypertension: Secondary | ICD-10-CM | POA: Diagnosis not present

## 2014-09-23 DIAGNOSIS — Z66 Do not resuscitate: Secondary | ICD-10-CM | POA: Diagnosis present

## 2014-09-23 DIAGNOSIS — R0902 Hypoxemia: Secondary | ICD-10-CM | POA: Diagnosis present

## 2014-09-23 DIAGNOSIS — Z87891 Personal history of nicotine dependence: Secondary | ICD-10-CM

## 2014-09-23 DIAGNOSIS — J9601 Acute respiratory failure with hypoxia: Secondary | ICD-10-CM | POA: Diagnosis present

## 2014-09-23 HISTORY — DX: Unspecified dementia, unspecified severity, without behavioral disturbance, psychotic disturbance, mood disturbance, and anxiety: F03.90

## 2014-09-23 LAB — BASIC METABOLIC PANEL
ANION GAP: 10 (ref 5–15)
BUN: 12 mg/dL (ref 6–23)
CALCIUM: 8.4 mg/dL (ref 8.4–10.5)
CO2: 25 mmol/L (ref 19–32)
Chloride: 101 mmol/L (ref 96–112)
Creatinine, Ser: 0.85 mg/dL (ref 0.50–1.35)
GFR calc Af Amer: >90 mL/min (ref >90)
GFR calc non Af Amer: 80 mL/min — ABNORMAL LOW (ref >90)
Glucose, Bld: 103 mg/dL — ABNORMAL HIGH (ref 70–99)
POTASSIUM: 3.8 mmol/L (ref 3.5–5.1)
SODIUM: 136 mmol/L (ref 135–145)

## 2014-09-23 LAB — I-STAT CG4 LACTIC ACID, ED: LACTIC ACID, VENOUS: 0.64 mmol/L (ref 0.5–2.0)

## 2014-09-23 LAB — CBC WITH DIFFERENTIAL/PLATELET
BASOS ABS: 0 10*3/uL (ref 0.0–0.1)
Basophils Relative: 1 % (ref 0–1)
Eosinophils Absolute: 0.2 10*3/uL (ref 0.0–0.7)
Eosinophils Relative: 5 % (ref 0–5)
HEMATOCRIT: 35.4 % — AB (ref 39.0–52.0)
HEMOGLOBIN: 10.9 g/dL — AB (ref 13.0–17.0)
LYMPHS PCT: 24 % (ref 12–46)
Lymphs Abs: 1.2 10*3/uL (ref 0.7–4.0)
MCH: 27.7 pg (ref 26.0–34.0)
MCHC: 30.8 g/dL (ref 30.0–36.0)
MCV: 89.8 fL (ref 78.0–100.0)
Monocytes Absolute: 0.5 10*3/uL (ref 0.1–1.0)
Monocytes Relative: 10 % (ref 3–12)
NEUTROS PCT: 60 % (ref 43–77)
Neutro Abs: 3 10*3/uL (ref 1.7–7.7)
PLATELETS: 194 10*3/uL (ref 150–400)
RBC: 3.94 MIL/uL — AB (ref 4.22–5.81)
RDW: 11.8 % (ref 11.5–15.5)
WBC: 4.9 10*3/uL (ref 4.0–10.5)

## 2014-09-23 LAB — RETICULOCYTES
RBC.: 4.12 MIL/uL — ABNORMAL LOW (ref 4.22–5.81)
Retic Count, Absolute: 24.7 10*3/uL (ref 19.0–186.0)
Retic Ct Pct: 0.6 % (ref 0.4–3.1)

## 2014-09-23 MED ORDER — LEVETIRACETAM IN NACL 500 MG/100ML IV SOLN
500.0000 mg | Freq: Two times a day (BID) | INTRAVENOUS | Status: DC
  Administered 2014-09-23 – 2014-09-26 (×7): 500 mg via INTRAVENOUS
  Filled 2014-09-23 (×10): qty 100

## 2014-09-23 MED ORDER — ONDANSETRON HCL 4 MG/2ML IJ SOLN: 4.0000 mg | Freq: Four times a day (QID) | INTRAMUSCULAR | Status: DC | PRN

## 2014-09-23 MED ORDER — SODIUM CHLORIDE 0.9 % IJ SOLN
3.0000 mL | Freq: Two times a day (BID) | INTRAMUSCULAR | Status: DC
  Administered 2014-09-23: 3 mL via INTRAVENOUS

## 2014-09-23 MED ORDER — PIPERACILLIN-TAZOBACTAM 3.375 G IVPB
3.3750 g | Freq: Three times a day (TID) | INTRAVENOUS | Status: DC
  Administered 2014-09-24 – 2014-09-25 (×4): 3.375 g via INTRAVENOUS
  Filled 2014-09-23 (×5): qty 50

## 2014-09-23 MED ORDER — PNEUMOCOCCAL VAC POLYVALENT 25 MCG/0.5ML IJ INJ
0.5000 mL | INJECTION | INTRAMUSCULAR | Status: AC
  Administered 2014-09-24: 0.5 mL via INTRAMUSCULAR
  Filled 2014-09-23 (×2): qty 0.5

## 2014-09-23 MED ORDER — ONDANSETRON HCL 4 MG/2ML IJ SOLN: 4.0000 mg | Freq: Three times a day (TID) | INTRAMUSCULAR | Status: AC | PRN

## 2014-09-23 MED ORDER — ASPIRIN EC 325 MG PO TBEC
325.0000 mg | DELAYED_RELEASE_TABLET | Freq: Every day | ORAL | Status: DC
  Filled 2014-09-23 (×2): qty 1

## 2014-09-23 MED ORDER — PIPERACILLIN-TAZOBACTAM 3.375 G IVPB 30 MIN
3.3750 g | Freq: Once | INTRAVENOUS | Status: AC
  Administered 2014-09-23: 3.375 g via INTRAVENOUS
  Filled 2014-09-23: qty 50

## 2014-09-23 MED ORDER — CETYLPYRIDINIUM CHLORIDE 0.05 % MT LIQD
7.0000 mL | Freq: Two times a day (BID) | OROMUCOSAL | Status: DC
  Administered 2014-09-24 – 2014-09-29 (×10): 7 mL via OROMUCOSAL

## 2014-09-23 MED ORDER — HYDRALAZINE HCL 20 MG/ML IJ SOLN
5.0000 mg | INTRAMUSCULAR | Status: DC | PRN
  Administered 2014-09-26: 10 mg via INTRAVENOUS
  Filled 2014-09-23: qty 1

## 2014-09-23 MED ORDER — HEPARIN SODIUM (PORCINE) 5000 UNIT/ML IJ SOLN
5000.0000 [IU] | Freq: Three times a day (TID) | INTRAMUSCULAR | Status: DC
  Administered 2014-09-23 – 2014-09-30 (×19): 5000 [IU] via SUBCUTANEOUS
  Filled 2014-09-23 (×21): qty 1

## 2014-09-23 MED ORDER — ONDANSETRON HCL 4 MG PO TABS: 4.0000 mg | ORAL_TABLET | Freq: Four times a day (QID) | ORAL | Status: DC | PRN

## 2014-09-23 MED ORDER — SODIUM CHLORIDE 0.9 % IV SOLN
INTRAVENOUS | Status: DC
  Administered 2014-09-23 – 2014-09-24 (×2): via INTRAVENOUS
  Administered 2014-09-24: 100 mL/h via INTRAVENOUS
  Administered 2014-09-26: via INTRAVENOUS

## 2014-09-23 MED ORDER — LEVOFLOXACIN IN D5W 750 MG/150ML IV SOLN
750.0000 mg | Freq: Once | INTRAVENOUS | Status: AC
  Administered 2014-09-23: 750 mg via INTRAVENOUS
  Filled 2014-09-23: qty 150

## 2014-09-23 MED ORDER — CHLORHEXIDINE GLUCONATE 0.12 % MT SOLN
15.0000 mL | Freq: Two times a day (BID) | OROMUCOSAL | Status: DC
  Administered 2014-09-23 – 2014-09-29 (×11): 15 mL via OROMUCOSAL
  Filled 2014-09-23 (×10): qty 15

## 2014-09-23 NOTE — ED Notes (Signed)
Bed: RESA Expected date:  Expected time:  Means of arrival:  Comments: EMS- peg tube placement

## 2014-09-23 NOTE — ED Notes (Signed)
Called floor and was told to call back in 5 min but the timer is started.Marland Kitchenklj

## 2014-09-23 NOTE — H&P (Addendum)
[facility] History and Physical  Andrus Sharp RPR:[id number] DOB: 08-Mar-1931 DOA: 09/23/2014  Referring physician: Dr Hyman Bower PCP: Cyndee Brightly, MD   Chief Complaint: Aspiration Pneumonia  HPI: Ronald Sutton is a 81 y.o. male   Level 5 Caveat - Pt Demented  Patient presents for [facility] with reported aspiration pneumonia. Patient already on a honey thick liquid diet. Per report patient already being treated for aspiration pneumonia with Levaquin IV. Peripheral IV in place since 09/22/2014. Patient sent to hospital for evaluation of a feeding tube due to recurrent aspiration while eating. Per report patient hypoxic to 80% in transit by EMS..  Currently patient's only complaint is being cold and wanting his clothes back. Patient is unable to give the date. Patient is aware of person only. Patient states he came here because of a problem with his food.  Review of Systems:  Per HPI but pt demented and unable to give a reliable history.    Past Medical History  Diagnosis Date  . Hypertension   . Diabetes mellitus without complication   . CVA (cerebral infarction)     Right-sided hemiparesis  . Seizures   . A-fib   . Anemia   . Arthritis     osteoarthritis  . Cancer     Oral cancer?  . Dementia    Past Surgical History  Procedure Laterality Date  . Cholecystectomy    . Hip pinning,cannulated Right 08/06/2013    Procedure: CANNULATED HIP PINNING ;  Surgeon: Mcarthur Rossetti, MD;  Location: [facility];  Service: Orthopedics;  Laterality: Right;   Social History:  reports that he has quit smoking. He does not have any smokeless tobacco history on file. He reports that he does not drink alcohol or use illicit drugs.  No Known Allergies  Family History  Problem Relation Age of Onset  . Family history unknown: Yes  Pt demented and unable to give accurate history   Prior to Admission medications   Medication Sig Start Date End Date Taking?  Authorizing Provider  Alum & Mag Hydroxide-Simeth (MAGIC MOUTHWASH) SOLN Take 5 mLs by mouth 3 (three) times daily.   Yes Historical Provider, MD  acetaminophen (TYLENOL) 325 MG tablet Take 2 tablets (650 mg total) by mouth every 6 (six) hours as needed for mild pain (or Fever >/= 101). 08/07/13   Mcarthur Rossetti, MD  amLODipine-benazepril (LOTREL) 10-20 MG per capsule Take 1 capsule by mouth daily.    Historical Provider, MD  aspirin 325 MG EC tablet Take 325 mg by mouth daily.    Historical Provider, MD  cetirizine (ZYRTEC) 10 MG tablet Take 10 mg by mouth daily.    Historical Provider, MD  cetirizine (ZYRTEC) 10 MG tablet Take 10 mg by mouth daily.    Historical Provider, MD  fluticasone (FLONASE) 50 MCG/ACT nasal spray Place 1 spray into both nostrils daily. For allergies    Historical Provider, MD  levETIRAcetam (KEPPRA) 500 MG tablet Take 500 mg by mouth 2 (two) times daily.    Historical Provider, MD  Multiple Vitamin (MULTI VITAMIN MENS) tablet Take 1 tablet by mouth daily.    Historical Provider, MD  nitroGLYCERIN (NITROSTAT) 0.4 MG SL tablet Place 0.4 mg under the tongue every 5 (five) minutes as needed for chest pain.    Historical Provider, MD  sennosides-docusate sodium (SENOKOT-S) 8.6-50 MG tablet Take 1 tablet by mouth 2 (two) times daily.    Historical Provider, MD   Physical Exam: Danley Danker  Vitals:   09/23/14 1715 09/23/14 1729 09/23/14 1745 09/23/14 1937  BP:    149/76  Pulse: 98 97 100 94  Temp:      TempSrc:      Resp: 20 23 20 16   SpO2: 100% 99% 100% 99%    Wt Readings from Last 3 Encounters:  05/04/14 58.741 kg (129 lb 8 oz)  04/02/14 57.607 kg (127 lb)  03/04/14 57.607 kg (127 lb)    General:  Appears calm and comfortable Eyes:  PERRL, normal lids, irises & conjunctiva ENT:  grossly normal hearing, lips & tongue Neck:  no LAD, masses or thyromegaly Cardiovascular:  RRR, no m/r/g. No LE edema. Telemetry:  SR, no arrhythmias  Respiratory: Diffuse rhonchi and  crackles bilaterally. Normal work of breathing. On 2 L nasal cannula. Abdomen:  soft, ntnd Skin:  no rash or induration seen on limited exam Musculoskeletal:  grossly normal tone BUE/BLE Psychiatric: Patient alert and oriented to person only. Difficulty with articulation but answers questions appropriately  Neurologic: Cranial nerves II through XII grossly intact. Moves all extremities incorporated fashion.           Labs on Admission:  Basic Metabolic Panel:  Recent Labs Lab 09/23/14 1653  NA 136  K 3.8  CL 101  CO2 25  GLUCOSE 103*  BUN 12  CREATININE 0.85  CALCIUM 8.4   Liver Function Tests: No results for input(s): AST, ALT, ALKPHOS, BILITOT, PROT, ALBUMIN in the last 168 hours. No results for input(s): LIPASE, AMYLASE in the last 168 hours. No results for input(s): AMMONIA in the last 168 hours. CBC:  Recent Labs Lab 09/23/14 1653  WBC 4.9  NEUTROABS 3.0  HGB 10.9*  HCT 35.4*  MCV 89.8  PLT 194   Cardiac Enzymes: No results for input(s): CKTOTAL, CKMB, CKMBINDEX, TROPONINI in the last 168 hours.  BNP (last 3 results) No results for input(s): BNP in the last 8760 hours.  ProBNP (last 3 results) No results for input(s): PROBNP in the last 8760 hours.  CBG: No results for input(s): GLUCAP in the last 168 hours.  Radiological Exams on Admission: Dg Chest Port 1 View  09/23/2014   CLINICAL DATA:  Nursing home patient with current diagnosis of aspiration pneumonia. Patient non communicative. Hypoxemia with oxygen saturation 80% on room air.  EXAM: PORTABLE CHEST - 1 VIEW  COMPARISON:  08/05/2013 and earlier.  FINDINGS: Interval development of patchy airspace opacities in the lower lobes and likely right middle lobe since the most recent prior examination 08/05/2013. Cardiac silhouette normal in size, unchanged. Thoracic aorta tortuous and atherosclerotic, unchanged.  IMPRESSION: Patchy pneumonia involving the lung bases, likely the lower lobes and right middle  lobe, query aspiration.   Electronically Signed   By: Evangeline Dakin M.D.   On: 09/23/2014 17:00     Assessment/Plan Principal Problem:   Aspiration pneumonia Active Problems:   Essential hypertension, benign   Seizure disorder   Dementia   Anemia of chronic disease   Paroxysmal a-fib   History of CVA (cerebrovascular accident)   Dysphagia  Dysphagia/Aspiration PNeumonia/HCAP: Per report from nursing home patient has had multiple bouts of aspiration over the last several days and was started on IV Levaquin 2 days ago for an aspiration pneumonia. Patient aspirating on honey thick liquid. Nursing home physician and speech requesting admission. Reported O2 sats of 80% by EMS and 89% in the ED. Chest x-Indy consistent with bilateral lower lobe and right middle lobe pneumonias concerning for aspiration.  -  Telemetry - ABG - NPO - Speech swallow eval  - GI consult depending on Speech eval (per [facility]) - Change to Zosyn - O2 PRN - Sputum Cx - Legionella and Strep Ag - f/u BCX  Dementia: Per nursing home patient's mentation is at baseline. Patient's healthcare power of attorney is [name], phone number [phone]. Patient's dementia may limit treatment options for dysphagia in this discussion will have to be had with family as there have been reported requests for possible feeding tube. Multiple attempts were made unsuccessfully to speak to [name]. Spoke to patient's niece who will come to the hospital to help answer questions. No reported behavior disturbances.  HTN:  - Hold  amlodipine, benazepril until taking PO - Hydralazine SBP >180  Seizures: No recent seizures per report Continue Keppra  Anemia: Hgb 10.9 - baseline. likely from chronic disease adn poor nutrition - Anemia panel - CBC in am  Paroxysmal A. fib: Patient is not an anticoagulant appropriate patient. Sinus on telemetry monitor in the ED. No rate controlling medications. - EKG  History of CVA: - Continue  ASA  Code Status: FULL DVT Prophylaxis: Hep Family Communication: Niece - [name] Disposition Plan: pending evaluation and improvement in medical condition  Andreia Gandolfi Lenna Sciara, MD Family Medicine [facility] [email or web address] Password [email or web address]

## 2014-09-23 NOTE — ED Provider Notes (Signed)
CSN: [id number]     Arrival date & time 09/23/14  1625 History   First MD Initiated Contact with Patient 09/23/14 1627     Chief Complaint  Patient presents with  . Aspiration Pneumonia      (Consider location/radiation/quality/duration/timing/severity/associated sxs/prior Treatment) HPI Comments: The patient is a pleasant 81 year old male who comes from [facility] after having recurrent aspiration pneumonia. He is already on honey thickened liquids, he was started on antibiotics in the last 48 hours because of recurrent aspiration pneumonia, currently taking Levaquin. He had an IV placed yesterday for this reason, because of ongoing and recurrent aspirations he was sent to the hospital for evaluation as well as evaluation for a feeding tube placement to prevent further aspirations. The patient states that he feels slightly uncomfortable with breathing, he was hypoxic to 80% for paramedics, he denies vomiting, fever, cough but the patient does have dementia.  The history is provided by the patient, the EMS personnel and the nursing home.    Past Medical History  Diagnosis Date  . Hypertension   . Diabetes mellitus without complication   . CVA (cerebral infarction)     Right-sided hemiparesis  . Seizures   . A-fib   . Anemia   . Arthritis     osteoarthritis  . Cancer     Oral cancer?   Past Surgical History  Procedure Laterality Date  . Cholecystectomy    . Hip pinning,cannulated Right 08/06/2013    Procedure: CANNULATED HIP PINNING ;  Surgeon: Mcarthur Rossetti, MD;  Location: [facility];  Service: Orthopedics;  Laterality: Right;   History reviewed. No pertinent family history. History  Substance Use Topics  . Smoking status: Former Research scientist (life sciences)  . Smokeless tobacco: Not on file  . Alcohol Use: No    Review of Systems  Unable to perform ROS: Dementia      Allergies  Review of patient's allergies indicates no known allergies.  Home Medications   Prior to  Admission medications   Medication Sig Start Date End Date Taking? Authorizing Provider  Alum & Mag Hydroxide-Simeth (MAGIC MOUTHWASH) SOLN Take 5 mLs by mouth 3 (three) times daily.   Yes Historical Provider, MD  acetaminophen (TYLENOL) 325 MG tablet Take 2 tablets (650 mg total) by mouth every 6 (six) hours as needed for mild pain (or Fever >/= 101). 08/07/13   Mcarthur Rossetti, MD  amLODipine-benazepril (LOTREL) 10-20 MG per capsule Take 1 capsule by mouth daily.    Historical Provider, MD  aspirin 325 MG EC tablet Take 325 mg by mouth daily.    Historical Provider, MD  cetirizine (ZYRTEC) 10 MG tablet Take 10 mg by mouth daily.    Historical Provider, MD  cetirizine (ZYRTEC) 10 MG tablet Take 10 mg by mouth daily.    Historical Provider, MD  fluticasone (FLONASE) 50 MCG/ACT nasal spray Place 1 spray into both nostrils daily. For allergies    Historical Provider, MD  levETIRAcetam (KEPPRA) 500 MG tablet Take 500 mg by mouth 2 (two) times daily.    Historical Provider, MD  Multiple Vitamin (MULTI VITAMIN MENS) tablet Take 1 tablet by mouth daily.    Historical Provider, MD  nitroGLYCERIN (NITROSTAT) 0.4 MG SL tablet Place 0.4 mg under the tongue every 5 (five) minutes as needed for chest pain.    Historical Provider, MD  sennosides-docusate sodium (SENOKOT-S) 8.6-50 MG tablet Take 1 tablet by mouth 2 (two) times daily.    Historical Provider, MD   BP  170/71 mmHg  Pulse 100  Temp(Src) 98.9 F (37.2 C) (Oral)  Resp 20  SpO2 100% Physical Exam  Constitutional: He appears well-developed and well-nourished. No distress.  HENT:  Head: Normocephalic and atraumatic.  Mouth/Throat: Oropharynx is clear and moist. No oropharyngeal exudate.  Eyes: Conjunctivae and EOM are normal. Pupils are equal, round, and reactive to light. Right eye exhibits no discharge. Left eye exhibits no discharge. No scleral icterus.  Neck: Normal range of motion. Neck supple. No JVD present. No thyromegaly present.   Cardiovascular: Normal rate, regular rhythm, normal heart sounds and intact distal pulses.  Exam reveals no gallop and no friction rub.   No murmur heard. Pulmonary/Chest: Effort normal. No respiratory distress. He has no wheezes. He has rales (rales at bilateral bases).  Mild tachypnea, no accessory muscle use  Abdominal: Soft. Bowel sounds are normal. He exhibits no distension and no mass. There is no tenderness.  Musculoskeletal: Normal range of motion. He exhibits no edema or tenderness.  Lymphadenopathy:    He has no cervical adenopathy.  Neurological: He is alert. Coordination normal.  Skin: Skin is warm and dry. No rash noted. No erythema.  Psychiatric: He has a normal mood and affect. His behavior is normal.  Nursing note and vitals reviewed.   ED Course  Procedures (including critical care time) Labs Review Labs Reviewed  CULTURE, BLOOD (ROUTINE X 2)  CULTURE, BLOOD (ROUTINE X 2)  BASIC METABOLIC PANEL  CBC WITH DIFFERENTIAL/PLATELET  I-STAT CG4 LACTIC ACID, ED    Imaging Review Dg Chest Port 1 View  09/23/2014   CLINICAL DATA:  Nursing home patient with current diagnosis of aspiration pneumonia. Patient non communicative. Hypoxemia with oxygen saturation 80% on room air.  EXAM: PORTABLE CHEST - 1 VIEW  COMPARISON:  08/05/2013 and earlier.  FINDINGS: Interval development of patchy airspace opacities in the lower lobes and likely right middle lobe since the most recent prior examination 08/05/2013. Cardiac silhouette normal in size, unchanged. Thoracic aorta tortuous and atherosclerotic, unchanged.  IMPRESSION: Patchy pneumonia involving the lung bases, likely the lower lobes and right middle lobe, query aspiration.   Electronically Signed   By: Evangeline Dakin M.D.   On: 09/23/2014 17:00      MDM   Final diagnoses:  Aspiration pneumonia, unspecified aspiration pneumonia type    The patient apparently has recurrent aspiration of air, this is of concern as he has had a  stroke which affects his swallowing ability and is now feeling thickened liquids. Oxygen 89% on room air, supplemental given, chest x-Kendry, labs, plan admission for antibiotics and G-tube placement.  X-Mirza confirms aspiration pneumonia, labs pending, lactic acid normal, vital signs unremarkable, supplemental oxygen raises him to over 90%, stable for admission to a MedSurg bed, discussed with hospitalist who agrees. Marily Memos)  Meds given in ED:  Medications  levofloxacin (LEVAQUIN) IVPB 750 mg (750 mg Intravenous New Bag/Given 09/23/14 1720)    New Prescriptions   No medications on file      Noemi Chapel, MD 09/23/14 [phone]

## 2014-09-23 NOTE — ED Notes (Addendum)
Per EMS pt from [facility] sent out for feeding tube placement due to aspiration pneumonia and risk for further aspiration pneumonia. Facility placed IV for IV nutrition, date of placement unknown. Per EMS pt O2 saturation 80 % on room air, pt placed on 4 L/min O2, O2 saturation raised to 98%, pt does not normally wear home O2. Per EMS pt is alert and oriented.

## 2014-09-23 NOTE — Progress Notes (Addendum)
CSW met with pt at bedside. Pt did not talk much during the interview. It was difficult to understand pt. Also, pt was hard of hearing. Per note, pt has a hx of dementia. However, ptatient confirms that he is from [facility]. Note states, that pt presents to [facility] due to aspiration pneumonia.   During the interview the pt's niece walked into the room. Niece states that the pt has been living at [facility] for over 5 years. Also, niece states that the pt broke his hip a bout a year ago. Niece did not appear to be sure about if pt is able to complete his ADL's independently.  Niece states that the pt has a good support system as far as communication goes. She informed CSW that the pt's POA is his sister who is 13 years old, and also has her own health issues. Niece states that all of the pt's main supports are over the age of 88. Niece states that she would also be a good contact person for pt. She states that she is also listed as a contact for the pt while at [facility] due to his other family members being elderly. Niece states the pt does have a son. However, he lives in [place] and that he and the pt do not communicate too often.  Patient appeared to be cold at bedside. CSW asked pt if he would like a blanket. Pt stated that he would like a blanket. CSW consulted with nurse who states that it is okay for pt to have blanket. Patient and niece do not have any questions.   [name] [phone] Niece/[name] [phone]  Willette Brace [phone] ED CSW 09/23/2014 9:48 PM

## 2014-09-23 NOTE — Progress Notes (Addendum)
ANTIBIOTIC CONSULT NOTE - INITIAL  Pharmacy Consult for Zosyn Indication: Aspiration pneumonia  No Known Allergies  Patient Measurements: Height: 5\' 8"  (172.7 cm) Weight: 127 lb (57.607 kg) IBW/kg (Calculated) : 68.4  Vital Signs: Temp: 98.5 F (36.9 C) (03/14 2004) Temp Source: Oral (03/14 2004) BP: 175/78 mmHg (03/14 2004) Pulse Rate: 99 (03/14 2004) Intake/Output from previous day:   Intake/Output from this shift:    Labs:  Recent Labs  09/23/14 1653  WBC 4.9  HGB 10.9*  PLT 194  CREATININE 0.85   Estimated Creatinine Clearance: 55.5 mL/min (by C-G formula based on Cr of 0.85). No results for input(s): VANCOTROUGH, VANCOPEAK, VANCORANDOM, GENTTROUGH, GENTPEAK, GENTRANDOM, TOBRATROUGH, TOBRAPEAK, TOBRARND, AMIKACINPEAK, AMIKACINTROU, AMIKACIN in the last 72 hours.   Microbiology: No results found for this or any previous visit (from the past 720 hour(s)).  Medical History: Past Medical History  Diagnosis Date  . Hypertension   . Diabetes mellitus without complication   . CVA (cerebral infarction)     Right-sided hemiparesis  . Seizures   . A-fib   . Anemia   . Arthritis     osteoarthritis  . Cancer     Oral cancer?  . Dementia     Medications:  Scheduled:  . [START ON 09/24/2014] antiseptic oral rinse  7 mL Mouth Rinse q12n4p  . aspirin  325 mg Oral Daily  . chlorhexidine  15 mL Mouth Rinse BID  . heparin  5,000 Units Subcutaneous 3 times per day  . levETIRAcetam  500 mg Intravenous Q12H  . [START ON 09/24/2014] piperacillin-tazobactam (ZOSYN)  IV  3.375 g Intravenous 3 times per day  . [START ON 09/24/2014] pneumococcal 23 valent vaccine  0.5 mL Intramuscular Tomorrow-1000  . sodium chloride  3 mL Intravenous Q12H   Infusions:  . sodium chloride 100 mL/hr at 09/23/14 2155   PRN: hydrALAZINE, ondansetron (ZOFRAN) IV, ondansetron **OR** [DISCONTINUED] ondansetron (ZOFRAN) IV Anti-infectives    Start     Dose/Rate Route Frequency Ordered Stop   09/24/14 0600  piperacillin-tazobactam (ZOSYN) IVPB 3.375 g     3.375 g 12.5 mL/hr over 240 Minutes Intravenous 3 times per day 09/23/14 2119     09/23/14 2200  piperacillin-tazobactam (ZOSYN) IVPB 3.375 g     3.375 g 100 mL/hr over 30 Minutes Intravenous  Once 09/23/14 2119 09/23/14 2226   09/23/14 1715  levofloxacin (LEVAQUIN) IVPB 750 mg     750 mg 100 mL/hr over 90 Minutes Intravenous  Once 09/23/14 1711 09/23/14 1850     Assessment: 83yoM from NH admitted with worsening aspiration PNA.  Started on IV Levaquin 2 days ago for same.  Hypoxic on arrival; chest x-[place] consistent with bilateral lower lobe and right middle lobe pneumonias concerning for aspiration.  To begin Zosyn for aspiration pneumonia  3/14 >> Zosyn >>  Tmax: AF WBCs:4.9 Renal: SCr 0.85; CrCl 55 CG  3/14 blood: IP 3/14 sputum: IP  3/14 influenza panel: IP 3/14 legionella UAg: IP 3/14 strep pneumo UAg: IP   Goal of Therapy:   Eradication of infection  Appropriate antibiotic dosing for indication and renal function  Plan:   Zosyn 3.375 mg IV x1 over 30 min to start at 2200, then 3.375 mg IV q8h over 4-hr infusion, to start tomorrow at 0600  Patient with known seizure disorder on Keppra; f/u renal function closely while on beta-lactams  Follow clinical course, culture results as available  Zosyn should cover most clinically relevant anaerobes and respiratory bacteria.  Once stable, can  consider narrowing to Unasyn or Augmentin   Reuel Boom, PharmD Pager: [phone] 09/23/2014, 11:37 PM

## 2014-09-23 NOTE — Progress Notes (Signed)
Clinical Social Work Department BRIEF PSYCHOSOCIAL ASSESSMENT 09/23/2014  Patient:  Ronald Sutton, Ronald Sutton     Account Number:  [id number]     Admit date:  09/23/2014  Clinical Social Worker:  Tilda Burrow, CLINICAL SOCIAL WORKER  Date/Time:  09/23/2014 11:14 AM  Referred by:  CSW  Date Referred:  09/23/2014 Referred for  Other - See comment   Other Referral:   Patient currently lives at [facility].   Interview type:  Family Other interview type:   CSW was not able to collect much information patient. However, he was able to verify that he lives at [facility].    PSYCHOSOCIAL DATA Living Status:  FACILITY Admitted from facility:  [facility] Level of care:  Assisted Living Primary support name:  [name] Primary support relationship to patient:  [place] Degree of support available:   According to neice, who was at bedside. The pt's sister is to be considered his primary sipport because she is his POA. However, due to sisters age the neice states that it would be agood idea to call her along with the pt's sister.    CURRENT CONCERNS Current Concerns  Adjustment to Illness   Other Concerns:    SOCIAL WORK ASSESSMENT / PLAN CSW met with pt at bedside. Pt did not talk much during the interview. It was difficult to understand pt. Also, pt was hard of hearing. Per note, pt has a hx of dementia. However, ptatient confirms that he is from [facility]. Note states, that pt presents to [facility] due to aspiration pneumonia.    During the interview the pt's niece walked into the room. Niece states that the pt has been living at [facility] for over 5 years. Also, niece states that the pt broke his hip a bout a year ago. Niece did not appear to be sure about if pt is able to complete his ADL's independently.    Niece states that the pt has a good support system as far as communication goes. She informed CSW that the pt's POA is his sister who is 48 years old, and also has her own  health issues. Niece states that all of the pt's main supports are over the age of 66. Niece states that she would also be a good contact person for pt. She states that she is also listed as a contact for the pt while at [facility] due to his other family members being elderly. Niece states the pt does have a son. However, he lives in [place] and that he and the pt do not communicate too often.    Patient appeared to be cold at bedside. CSW asked pt if he would like a blanket. Pt stated that he would like a blanket. CSW consulted with nurse who states that it is okay for pt to have blanket. Patient and niece do not have any questions.      [name] [phone]  Niece/[name] [phone]    Willette Brace [phone] ED CSW 09/23/2014 9:48 PM   Assessment/plan status:  No Further Intervention Required Other assessment/ plan:   Information/referral to community resources:   There is not other information or referrals needed at this time. Patient is currently receving care and living at [facility].    PATIENT'S/FAMILY'S RESPONSE TO PLAN OF CARE: Neice appears to be supportive at bedside. Patient appeears to be familar with pt. She states that she would be a good contact for pt due to the other family  members being elderly.    Willette Brace [phone] ED CSW 09/23/2014 11:25 PM

## 2014-09-24 DIAGNOSIS — J690 Pneumonitis due to inhalation of food and vomit: Secondary | ICD-10-CM | POA: Diagnosis not present

## 2014-09-24 LAB — IRON AND TIBC
Iron: 15 ug/dL — ABNORMAL LOW (ref 42–165)
Saturation Ratios: 6 % — ABNORMAL LOW (ref 20–55)
TIBC: 269 ug/dL (ref 215–435)
UIBC: 254 ug/dL (ref 125–400)

## 2014-09-24 LAB — LEGIONELLA ANTIGEN, URINE

## 2014-09-24 LAB — BASIC METABOLIC PANEL
Anion gap: 10 (ref 5–15)
BUN: 11 mg/dL (ref 6–23)
CO2: 22 mmol/L (ref 19–32)
Calcium: 8.3 mg/dL — ABNORMAL LOW (ref 8.4–10.5)
Chloride: 101 mmol/L (ref 96–112)
Creatinine, Ser: 0.79 mg/dL (ref 0.50–1.35)
GFR calc Af Amer: >90 mL/min (ref >90)
GFR calc non Af Amer: 82 mL/min — ABNORMAL LOW (ref >90)
Glucose, Bld: 96 mg/dL (ref 70–99)
Potassium: 4.2 mmol/L (ref 3.5–5.1)
Sodium: 133 mmol/L — ABNORMAL LOW (ref 135–145)

## 2014-09-24 LAB — VITAMIN B12: Vitamin B-12: 1012 pg/mL — ABNORMAL HIGH (ref 211–911)

## 2014-09-24 LAB — STREP PNEUMONIAE URINARY ANTIGEN: Strep Pneumo Urinary Antigen: NEGATIVE

## 2014-09-24 LAB — INFLUENZA PANEL BY PCR (TYPE A & B)
H1N1 flu by pcr: NOT DETECTED
Influenza A By PCR: NEGATIVE
Influenza B By PCR: NEGATIVE

## 2014-09-24 LAB — CBC
HEMATOCRIT: 39.6 % (ref 39.0–52.0)
Hemoglobin: 12.6 g/dL — ABNORMAL LOW (ref 13.0–17.0)
MCH: 28.1 pg (ref 26.0–34.0)
MCHC: 31.8 g/dL (ref 30.0–36.0)
MCV: 88.4 fL (ref 78.0–100.0)
Platelets: 178 10*3/uL (ref 150–400)
RBC: 4.48 MIL/uL (ref 4.22–5.81)
RDW: 11.6 % (ref 11.5–15.5)
WBC: 4.4 10*3/uL (ref 4.0–10.5)

## 2014-09-24 LAB — FERRITIN: Ferritin: 313 ng/mL (ref 22–322)

## 2014-09-24 LAB — FOLATE: Folate: >20 ng/mL

## 2014-09-24 LAB — MRSA PCR SCREENING: MRSA by PCR: POSITIVE — AB

## 2014-09-24 MED ORDER — LORAZEPAM 2 MG/ML IJ SOLN
1.0000 mg | Freq: Once | INTRAMUSCULAR | Status: AC
  Administered 2014-09-28: 1 mg via INTRAVENOUS
  Filled 2014-09-24: qty 1

## 2014-09-24 MED ORDER — ASPIRIN 300 MG RE SUPP
300.0000 mg | Freq: Every day | RECTAL | Status: DC
Start: 2014-09-24 — End: 2014-09-30
  Administered 2014-09-24 – 2014-09-30 (×7): 300 mg via RECTAL
  Filled 2014-09-24 (×7): qty 1

## 2014-09-24 NOTE — Progress Notes (Signed)
Patient ID: Ronald Sutton  male  SWF:[id number]    DOB: 29-Apr-1931    DOA: 09/23/2014  PCP: Cyndee Brightly, MD  Brief history of present illness  Patient is a 81 year old male with hypertension, diabetes, CVA with right-sided hemiparesis, A. Fib, dementia, presented from [place] home with reported aspiration pneumonia. Patient already on honey thick liquid diet. Patient was being treated for aspiration pneumonia. Patient apparently was sent to the hospital for evaluation of a feeding tube, was reported to be hypoxic to 80% by EMS.   Assessment/Plan: Principal Problem:   Aspiration pneumonia with dysphagia - Agree with IV Zosyn, continue nothing by mouth status, IV fluids, - Somewhat agitated this morning, received Ativan IV 1, - Swallow evaluation, will need MBS, if he fails, we will need evaluation for PEG tube.However it likely still will not decrease the risk of aspiration. - follow blood cultures  Active Problems: Dementia - the patient currently was somewhat confused and agitated needing Ativan.    Essential hypertension, benign - Continue IV hydralazine with parameters  Seizure disorder Continue IV Keppra  Anemia - Baseline hemoglobin 10, likely anemia of chronic disease and  Malnutrition - currently stable  Paroxysmal atrial fibrillation - Currently rate controlled, not a candidate for anticoagulation secondary to advanced dementia and fall risk  History of CVA Continue aspirin PR   DVT Prophylaxis:heparin subcutaneous  Code Status:full code  Family Communication: no family member at the bedside  Disposition:  Consultants: none Procedures: CXR Antibiotics:  IV zosyn 3/14>    Subjective: Patient seen and examined, oriented to self, no fevers or chills, no acute issues overnight subsequently somewhat confused and agitated  Objective: Weight change:   Intake/Output Summary (Last 24 hours) at 09/24/14 1310 Last data filed at 09/24/14  1307  Gross per 24 hour  Intake    200 ml  Output    700 ml  Net   -500 ml   Blood pressure 151/72, pulse 86, temperature 98.2 F (36.8 C), temperature source Oral, resp. rate 18, height 5\' 8"  (1.727 m), weight 57.607 kg (127 lb), SpO2 100 %.  Physical Exam: General: Alert and awake, oriented to self CVS: S1-S2 clear, no murmur rubs or gallops Chest: coarse breath sounds to the bases Abdomen: soft nontender, nondistended, normal bowel sounds  Extremities: no cyanosis, clubbing or edema noted bilaterally   Lab Results: Basic Metabolic Panel:  Recent Labs Lab 09/23/14 1653 09/24/14 0450  NA 136 133*  K 3.8 4.2  CL 101 101  CO2 25 22  GLUCOSE 103* 96  BUN 12 11  CREATININE 0.85 0.79  CALCIUM 8.4 8.3*   Liver Function Tests: No results for input(s): AST, ALT, ALKPHOS, BILITOT, PROT, ALBUMIN in the last 168 hours. No results for input(s): LIPASE, AMYLASE in the last 168 hours. No results for input(s): AMMONIA in the last 168 hours. CBC:  Recent Labs Lab 09/23/14 1653 09/24/14 0450  WBC 4.9 4.4  NEUTROABS 3.0  --   HGB 10.9* 12.6*  HCT 35.4* 39.6  MCV 89.8 88.4  PLT 194 178   Cardiac Enzymes: No results for input(s): CKTOTAL, CKMB, CKMBINDEX, TROPONINI in the last 168 hours. BNP: Invalid input(s): POCBNP CBG: No results for input(s): GLUCAP in the last 168 hours.   Micro Results: Recent Results (from the past 240 hour(s))  MRSA PCR Screening     Status: Abnormal   Collection Time: 09/23/14 10:37 PM  Result Value Ref Range Status   MRSA by PCR POSITIVE (A) NEGATIVE  Final    Comment:        The GeneXpert MRSA Assay (FDA approved for NASAL specimens only), is one component of a comprehensive MRSA colonization surveillance program. It is not intended to diagnose MRSA infection nor to guide or monitor treatment for MRSA infections. RESULT CALLED TO, READ BACK BY AND VERIFIED WITH: L.REINERT,RN AT 0147 ON 09/24/14 BY W.SHEA,MT     Studies/Results: Dg  Chest Port 1 View  09/23/2014   CLINICAL DATA:  Nursing home patient with current diagnosis of aspiration pneumonia. Patient non communicative. Hypoxemia with oxygen saturation 80% on room air.  EXAM: PORTABLE CHEST - 1 VIEW  COMPARISON:  08/05/2013 and earlier.  FINDINGS: Interval development of patchy airspace opacities in the lower lobes and likely right middle lobe since the most recent prior examination 08/05/2013. Cardiac silhouette normal in size, unchanged. Thoracic aorta tortuous and atherosclerotic, unchanged.  IMPRESSION: Patchy pneumonia involving the lung bases, likely the lower lobes and right middle lobe, query aspiration.   Electronically Signed   By: Evangeline Dakin M.D.   On: 09/23/2014 17:00    Medications: Scheduled Meds: . antiseptic oral rinse  7 mL Mouth Rinse q12n4p  . aspirin  325 mg Oral Daily  . chlorhexidine  15 mL Mouth Rinse BID  . heparin  5,000 Units Subcutaneous 3 times per day  . levETIRAcetam  500 mg Intravenous Q12H  . LORazepam  1 mg Intravenous Once  . piperacillin-tazobactam (ZOSYN)  IV  3.375 g Intravenous 3 times per day  . sodium chloride  3 mL Intravenous Q12H      LOS: 1 day   Ronald Sutton M.D. [facility] 09/24/2014, 1:10 PM Pager: [phone]  If 7PM-7AM, please contact night-coverage [email or web address] Password [email or web address]

## 2014-09-24 NOTE — Progress Notes (Signed)
INITIAL NUTRITION ASSESSMENT  DOCUMENTATION CODES Per approved criteria  -Non-severe (moderate) malnutrition in the context of chronic illness  Pt meets criteria for MODERATE (non-severe) MALNUTRITION in the context of CHRONIC ILLNESS as evidenced by moderate loss of subcutaneous fat mass and severe muscle wasting.  INTERVENTION: If/when feeding tube is placed: Initiate Jevity 1.2 @ 20 ml/hr via NGT (?PEG) and increase by 10 ml every 4 hours to goal rate of 55 ml/hr.   Tube feeding regimen provides 1584 kcal (100% of needs), 73 grams of protein, and 1065 ml of H2O.   RD to continue to monitor   NUTRITION DIAGNOSIS: Inadequate oral intake related to aspiration/dysphagia as evidenced by NPO status.   Goal: Pt to meet >/= 90% of their estimated nutrition needs   Monitor:  Diet advancement/TF intervention, weight trend, labs, Goals of Care  Reason for Assessment: Consult for nutritional need (pt NPO d/t aspiration)  81 y.o. male  Admitting Dx: Aspiration pneumonia  ASSESSMENT: Patient presents for [place] home with reported aspiration pneumonia. Patient already on a honey thick liquid diet. Per report patient already being treated for aspiration pneumonia with Levaquin IV. Patient sent to hospital for evaluation of a feeding tube due to recurrent aspiration while eating.   Per review of chart patient was on honey-thick liquids PTA, aspirated and is now NPO. Per weight history, pt's weight has been stable for the past 8 months. Pt unable to provide much history.   Labs: low sodium  Nutrition Focused Physical Exam:   Subcutaneous Fat:  Orbital Region: mild wasting Upper Arm Region: mild wasting Thoracic and Lumbar Region: moderate wasting  Muscle:  Temple Region: mild wasting Clavicle Bone Region: moderate wasting Clavicle and Acromion Bone Region: moderate wasting Scapular Bone Region: NA Dorsal Hand: moderate wasting Patellar Region: severe wasting Anterior  Thigh Region: severe wasting Posterior Calf Region: severe wasting  Edema: none noted  Height: Ht Readings from Last 1 Encounters:  09/23/14 5\' 8"  (1.727 m)    Weight: Wt Readings from Last 1 Encounters:  09/23/14 127 lb (57.607 kg)    Ideal Body Weight: 154 lbs  % Ideal Body Weight: 82%  Wt Readings from Last 10 Encounters:  09/23/14 127 lb (57.607 kg)  05/04/14 129 lb 8 oz (58.741 kg)  04/02/14 127 lb (57.607 kg)  03/04/14 127 lb (57.607 kg)  01/17/14 127 lb (57.607 kg)  12/24/13 130 lb (58.968 kg)  12/06/13 129 lb (58.514 kg)  08/06/13 140 lb 10.5 oz (63.8 kg)    Usual Body Weight: unknown  % Usual Body Weight: NA  BMI:  Body mass index is 19.31 kg/(m^2).  Estimated Nutritional Needs: Kcal: 1500-1750 Protein: 70-80 grams Fluid: 1.7 L/day  Skin: intact  Diet Order:  NPO  EDUCATION NEEDS: -No education needs identified at this time   Intake/Output Summary (Last 24 hours) at 09/24/14 1200 Last data filed at 09/24/14 0900  Gross per 24 hour  Intake    200 ml  Output    600 ml  Net   -400 ml    Last BM: PTA  Labs:   Recent Labs Lab 09/23/14 1653 09/24/14 0450  NA 136 133*  K 3.8 4.2  CL 101 101  CO2 25 22  BUN 12 11  CREATININE 0.85 0.79  CALCIUM 8.4 8.3*  GLUCOSE 103* 96    CBG (last 3)  No results for input(s): GLUCAP in the last 72 hours.  Scheduled Meds: . antiseptic oral rinse  7 mL Mouth Rinse  q12n4p  . aspirin  325 mg Oral Daily  . chlorhexidine  15 mL Mouth Rinse BID  . heparin  5,000 Units Subcutaneous 3 times per day  . levETIRAcetam  500 mg Intravenous Q12H  . LORazepam  1 mg Intravenous Once  . piperacillin-tazobactam (ZOSYN)  IV  3.375 g Intravenous 3 times per day  . sodium chloride  3 mL Intravenous Q12H    Continuous Infusions: . sodium chloride 100 mL/hr (09/24/14 1132)    Past Medical History  Diagnosis Date  . Hypertension   . Diabetes mellitus without complication   . CVA (cerebral infarction)      Right-sided hemiparesis  . Seizures   . A-fib   . Anemia   . Arthritis     osteoarthritis  . Cancer     Oral cancer?  . Dementia     Past Surgical History  Procedure Laterality Date  . Cholecystectomy    . Hip pinning,cannulated Right 08/06/2013    Procedure: CANNULATED HIP PINNING ;  Surgeon: Mcarthur Rossetti, MD;  Location: [facility];  Service: Orthopedics;  Laterality: Right;    Pryor Ochoa RD, LDN Inpatient Clinical Dietitian Pager: [phone] After Hours Pager: [phone]

## 2014-09-24 NOTE — Evaluation (Signed)
SLP Cancellation Note  Patient Details Name: Ronald Sutton MRN: [id number] DOB: 1931-03-22   Cancelled treatment:       Reason Eval/Treat Not Completed:  (xray unable to complete MBS this pm, spoke to MD who agrees pt will benefit from day of antibiotics and MBS, Wed 3/16)  Luanna Salk, [place] [place] [phone]

## 2014-09-25 ENCOUNTER — Inpatient Hospital Stay (HOSPITAL_COMMUNITY): Payer: Medicare Other

## 2014-09-25 DIAGNOSIS — J9601 Acute respiratory failure with hypoxia: Secondary | ICD-10-CM

## 2014-09-25 DIAGNOSIS — E43 Unspecified severe protein-calorie malnutrition: Secondary | ICD-10-CM | POA: Diagnosis present

## 2014-09-25 LAB — HIV ANTIBODY (ROUTINE TESTING W REFLEX): HIV SCREEN 4TH GENERATION: NONREACTIVE

## 2014-09-25 MED ORDER — VITAMINS A & D EX OINT
TOPICAL_OINTMENT | CUTANEOUS | Status: AC
Start: 2014-09-25 — End: 2014-09-25
  Administered 2014-09-25: 21:00:00
  Filled 2014-09-25: qty 5

## 2014-09-25 MED ORDER — AMPICILLIN-SULBACTAM SODIUM 1.5 (1-0.5) G IJ SOLR
1.5000 g | Freq: Four times a day (QID) | INTRAMUSCULAR | Status: DC
  Administered 2014-09-25 – 2014-09-30 (×21): 1.5 g via INTRAVENOUS
  Filled 2014-09-25 (×25): qty 1.5

## 2014-09-25 NOTE — Progress Notes (Signed)
[facility] PROGRESS NOTE Assessment/Plan: Aspiration pneumonia/  Dysphagia/acute respiratory failure with hypoxia: - He's had recurrent episode of aspiration, saturations have remained stable. - He was started empirically on Zosyn on admission, we'll de-escalate to Unasyn. - will consult PMT.  Protein-calorie malnutrition, severe - nutrition.  Paroxysmal a-fib: - Patient not appropriate for antegrade ablation per H&P, rate controlled in sinus rhythm.  History of CVA (cerebrovascular accident): - cont ASA  Anemia of chronic disease: - At baseline most likely due to cranial disease  Dementia: - Healthcare power of attorney is Ms. [name] phone [phone]   Seizure disorder: - Continue Keppra no changes were made.  Essential hypertension, benign: - Held oral antihypertensive medications using hydralazine for systolic blood pressure greater than 180.     Code Status: FULL DVT Prophylaxis: Hep Family Communication: none Disposition Plan: pending evaluation and improvement in medical condition   Consultants:  PMT  Procedures:  SLP  Antibiotics:  unasyn  HPI/Subjective: No complains.  Objective: Filed Vitals:   09/24/14 0401 09/24/14 1307 09/24/14 2034 09/25/14 0622  BP: 171/76 151/72 169/73 123/59  Pulse: 96 86 90 82  Temp: 98.2 F (36.8 C) 98.2 F (36.8 C) 98.2 F (36.8 C) 97.9 F (36.6 C)  TempSrc: Oral Oral Oral Oral  Resp: 16 18 18 16   Height:      Weight:      SpO2: 100% 100% 100% 100%    Intake/Output Summary (Last 24 hours) at 09/25/14 1020 Last data filed at 09/25/14 0538  Gross per 24 hour  Intake   1800 ml  Output   1600 ml  Net    200 ml   Filed Weights   09/23/14 2004  Weight: 57.607 kg (127 lb)    Exam:  General: Alert, awake, oriented x3, in no acute distress.  HEENT: No bruits, no goiter.  Heart: Regular rate and rhythm. Lungs: Good air movement, right side crackles. Abdomen: Soft, nontender, nondistended,  positive bowel sounds.  Neuro: Grossly intact, nonfocal.   Data Reviewed: Basic Metabolic Panel:  Recent Labs Lab 09/23/14 1653 09/24/14 0450  NA 136 133*  K 3.8 4.2  CL 101 101  CO2 25 22  GLUCOSE 103* 96  BUN 12 11  CREATININE 0.85 0.79  CALCIUM 8.4 8.3*   Liver Function Tests: No results for input(s): AST, ALT, ALKPHOS, BILITOT, PROT, ALBUMIN in the last 168 hours. No results for input(s): LIPASE, AMYLASE in the last 168 hours. No results for input(s): AMMONIA in the last 168 hours. CBC:  Recent Labs Lab 09/23/14 1653 09/24/14 0450  WBC 4.9 4.4  NEUTROABS 3.0  --   HGB 10.9* 12.6*  HCT 35.4* 39.6  MCV 89.8 88.4  PLT 194 178   Cardiac Enzymes: No results for input(s): CKTOTAL, CKMB, CKMBINDEX, TROPONINI in the last 168 hours. BNP (last 3 results) No results for input(s): BNP in the last 8760 hours.  ProBNP (last 3 results) No results for input(s): PROBNP in the last 8760 hours.  CBG: No results for input(s): GLUCAP in the last 168 hours.  Recent Results (from the past 240 hour(s))  Culture, blood (routine x 2)      Status: None (Preliminary result)   Collection Time: 09/23/14  5:11 PM  Result Value Ref Range Status   Specimen Description BLOOD RIGHT FOREARM  Final   Special Requests BOTTLES DRAWN AEROBIC AND ANAEROBIC 5ML  Final   Culture   Final           BLOOD CULTURE  RECEIVED NO GROWTH TO DATE CULTURE WILL BE HELD FOR 5 DAYS BEFORE ISSUING A FINAL NEGATIVE REPORT Performed at [facility]    Report Status PENDING  Incomplete  Culture, blood (routine x 2)      Status: None (Preliminary result)   Collection Time: 09/23/14  5:11 PM  Result Value Ref Range Status   Specimen Description BLOOD RAC  Final   Special Requests BOTTLES DRAWN AEROBIC AND ANAEROBIC 5ML  Final   Culture   Final           BLOOD CULTURE RECEIVED NO GROWTH TO DATE CULTURE WILL BE HELD FOR 5 DAYS BEFORE ISSUING A FINAL NEGATIVE REPORT Performed at [facility]     Report Status PENDING  Incomplete  MRSA PCR Screening     Status: Abnormal   Collection Time: 09/23/14 10:37 PM  Result Value Ref Range Status   MRSA by PCR POSITIVE (A) NEGATIVE Final    Comment:        The GeneXpert MRSA Assay (FDA approved for NASAL specimens only), is one component of a comprehensive MRSA colonization surveillance program. It is not intended to diagnose MRSA infection nor to guide or monitor treatment for MRSA infections. RESULT CALLED TO, READ BACK BY AND VERIFIED WITH: L.REINERT,RN AT 0147 ON 09/24/14 BY W.SHEA,MT      Studies: Dg Chest Port 1 View  09/23/2014   CLINICAL DATA:  Nursing home patient with current diagnosis of aspiration pneumonia. Patient non communicative. Hypoxemia with oxygen saturation 80% on room air.  EXAM: PORTABLE CHEST - 1 VIEW  COMPARISON:  08/05/2013 and earlier.  FINDINGS: Interval development of patchy airspace opacities in the lower lobes and likely right middle lobe since the most recent prior examination 08/05/2013. Cardiac silhouette normal in size, unchanged. Thoracic aorta tortuous and atherosclerotic, unchanged.  IMPRESSION: Patchy pneumonia involving the lung bases, likely the lower lobes and right middle lobe, query aspiration.   Electronically Signed   By: Evangeline Dakin M.D.   On: 09/23/2014 17:00    Scheduled Meds: . antiseptic oral rinse  7 mL Mouth Rinse q12n4p  . aspirin  300 mg Rectal Daily  . chlorhexidine  15 mL Mouth Rinse BID  . heparin  5,000 Units Subcutaneous 3 times per day  . levETIRAcetam  500 mg Intravenous Q12H  . LORazepam  1 mg Intravenous Once  . piperacillin-tazobactam (ZOSYN)  IV  3.375 g Intravenous 3 times per day  . sodium chloride  3 mL Intravenous Q12H   Continuous Infusions: . sodium chloride 100 mL/hr at 09/24/14 2154     Charlynne Cousins  [facility] Pager [phone]. If 7PM-7AM, please contact night-coverage at [email or web address], password [facility] 09/25/2014, 10:20 AM  LOS: 2 days

## 2014-09-25 NOTE — Progress Notes (Signed)
MBS scheduled for today - approximately 1145 am per xray.  RN aware.  Thanks. Luanna Salk, [place] [facility] SLP [phone]

## 2014-09-25 NOTE — Progress Notes (Addendum)
Speech Language Pathology Treatment: Dysphagia  Patient Details Name: Ronald Sutton MRN: [id number] DOB: 1930/09/19 Today's Date: 09/25/2014 Time: 1650-1700 SLP Time Calculation (min) (ACUTE ONLY): 10 min  Assessment / Plan / Recommendation Clinical Impression  SLP session to educate pt and niece *via phone per her request*  to results of MBS and considerations.   Pt appeared to have difficulty comprehending information shared with him during our session.    Education re: level of dysphagia due to CVA, oropharyngeal cancer, cerebellar degeneration reviewed.  Suspect has been having ongoing aspiration (likely for years- at least since 2009) with decreased recent tolerance due to advanced age, deconditioning and multiple complex medical issues.  Further educated Ronald Sutton and pt that feeding tubes do not prevent aspiration.  Advised pt and Ronald Sutton that regardless of diet consistency and/or NPO status, pt will have ongoing aspiration.   Feasible option may be for comfort intake with known risks and mitigation strategies.    Ronald Sutton reported understanding to information and advised she would share test results with Ronald Sutton.    Recommend NPO except small single ice chips after oral care until PMT meeting to determine goals of care.  Pt appeared comfortable and did not request anything upon SLP leaving room.     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  Swallow evaluation ordered.  Pt wth admit for fall in 07/2013.  MBS completed today with findings of moderately severe oropharyngeal and cervical esophageal dysphagia.  Visit to educate pt and neice via telephone to results of MBS and recommendations/considerations.     Pertinent Vitals Pain Assessment: No/denies pain  SLP  Plan  Continue with current plan of care    Recommendations Diet recommendations: NPO (pending PMT meeting) Medication Administration: Via alternative means              Oral Care Recommendations: Oral care Q4 per protocol;Oral care prior to ice chips Follow up Recommendations:  (tbd, doubtful will be indicated) Plan: Continue with current plan of care    [place], [place], [place] [facility] SLP [phone]

## 2014-09-25 NOTE — Procedures (Signed)
Objective Swallowing Evaluation: Modified Barium Swallowing Study  Patient Details  Name: Ronald Sutton MRN: [id number] Date of Birth: August 01, 1930  Today's Date: 09/25/2014 Time: SLP Start Time (ACUTE ONLY): 1210-SLP Stop Time (ACUTE ONLY): 1245 SLP Time Calculation (min) (ACUTE ONLY): 35 min  Past Medical History:  Past Medical History  Diagnosis Date  . Hypertension   . Diabetes mellitus without complication   . CVA (cerebral infarction)     Right-sided hemiparesis  . Seizures   . A-fib   . Anemia   . Arthritis     osteoarthritis  . Cancer     Oral cancer?  . Dementia    Past Surgical History:  Past Surgical History  Procedure Laterality Date  . Cholecystectomy    . 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  Swallow evaluation ordered.  Pt wth admit for fall in 07/2013.    No Data Recorded  Assessment / Plan / Recommendation CHL IP CLINICAL IMPRESSIONS 09/25/2014  Dysphagia Diagnosis Moderate oral phase dysphagia;Severe oral phase dysphagia;Moderate pharyngeal phase dysphagia;Severe pharyngeal phase dysphagia;Moderate cervical esophageal phase dysphagia;Severe cervical esophageal phase dysphagia  Clinical impression Moderately severe oropharyngeal and cervical esophageal dysphagia with sensorimotor deficits due to pt's h/o pharyngeal cancer, CVA, and cerebellar atrophy.  Lingual weakness resulted in delayed oral transiting, premature spillage into pharynx uncontrolled and residuals.  Pharyngeal swallow characterized by delay, decreased tongue base retraction and  poor proximal esophageal opening resulting in residuals and aspiration.  Pt with oropharyngeal residuals across consistencies without adequate ability to clear.    He likely has been experiencing chronic aspiration (dating back to at least 2005) with decreased tolerance due to advancing age, deconditioning etc.  He will aspirate regardless of consistency - and if pt goals become comfort (note PMT referral) = would defer to him for dietary desires.    Recommend NPO except small single ice boluses with adequate oral care until determination regarding goals is made.  Educated pt that feeding tube will not prevent aspiration.  Using live video, SLP educated pt to findings.        CHL IP TREATMENT RECOMMENDATION 09/25/2014  Treatment Plan Recommendations Therapy as outlined in treatment plan below     CHL IP DIET RECOMMENDATION 09/25/2014  Diet Recommendations NPO  Liquid Administration via (None)  Medication Administration (None)  Compensations (None)  Postural Changes and/or Swallow Maneuvers (None)     CHL IP OTHER RECOMMENDATIONS 09/25/2014  Recommended Consults (None)  Oral Care Recommendations Oral care Q4 per protocol;Oral care prior to ice chips  Other Recommendations Have oral suction available     CHL IP FOLLOW UP RECOMMENDATIONS 09/25/2014  Follow up Recommendations (No Data)     CHL IP FREQUENCY AND DURATION 09/25/2014  Speech Therapy Frequency (ACUTE ONLY) min 1 x/week  Treatment Duration 1 week         CHL IP REASON FOR REFERRAL 09/25/2014  Reason for Referral Objectively evaluate swallowing function     CHL IP ORAL PHASE 09/25/2014  Oral Phase Impaired  Oral - Honey Teaspoon Piecemeal swallowing;Reduced posterior propulsion;Weak lingual manipulation;Delayed oral transit;Lingual/palatal residue  Oral -  Honey Cup Reduced posterior propulsion;Piecemeal swallowing;Delayed oral transit;Weak lingual manipulation;Lingual/palatal residue  Oral - Honey Syringe (None)  Oral - Nectar  Teaspoon Reduced posterior propulsion;Piecemeal swallowing;Delayed oral transit;Weak lingual manipulation;Lingual/palatal residue  Oral - Nectar Cup Reduced posterior propulsion;Piecemeal swallowing;Delayed oral transit;Weak lingual manipulation;Lingual/palatal residue;Left anterior bolus loss  Oral - Thin Teaspoon Reduced posterior propulsion;Piecemeal swallowing;Delayed oral transit;Weak lingual manipulation;Lingual/palatal residue;Left anterior bolus loss  Oral - Thin Cup Reduced posterior propulsion;Piecemeal swallowing;Delayed oral transit;Weak lingual manipulation;Lingual/palatal residue;Left anterior bolus loss  Oral - Thin Straw Reduced posterior propulsion;Piecemeal swallowing;Delayed oral transit;Weak lingual manipulation;Lingual/palatal residue  Oral - Thin Syringe (None)  Oral - Puree Reduced posterior propulsion;Piecemeal swallowing;Delayed oral transit;Weak lingual manipulation;Lingual/palatal residue  Oral - Mechanical Soft Reduced posterior propulsion;Piecemeal swallowing;Delayed oral transit;Lingual/palatal residue  Oral - Regular (None)  Oral - Multi-consistency (None)  Oral - Pill (None)  Oral Phase - Comment premature spillage of barium into pharynx uncontrolled      CHL IP PHARYNGEAL PHASE 09/25/2014  Pharyngeal Phase Impaired  Pharyngeal - Honey Teaspoon Premature spillage to valleculae;Premature spillage to pyriform sinuses;Reduced pharyngeal peristalsis;Pharyngeal residue - valleculae;Pharyngeal residue - pyriform sinuses;Reduced tongue base retraction;Delayed swallow initiation  Pharyngeal - Honey Cup Premature spillage to pyriform sinuses;Premature spillage to valleculae;Reduced pharyngeal peristalsis;Pharyngeal residue - valleculae;Pharyngeal residue - pyriform sinuses;Reduced tongue base retraction;Delayed swallow initiation  Pharyngeal - Nectar Teaspoon Premature spillage to valleculae;Premature spillage to pyriform sinuses;Reduced pharyngeal peristalsis;Pharyngeal  residue - valleculae;Pharyngeal residue - pyriform sinuses;Reduced tongue base retraction;Delayed swallow initiation;Penetration/Aspiration during swallow;Penetration/Aspiration after swallow  Penetration/Aspiration details (nectar teaspoon) Material enters airway, CONTACTS cords and not ejected out  Pharyngeal - Nectar Cup Premature spillage to pyriform sinuses;Premature spillage to valleculae;Reduced pharyngeal peristalsis;Pharyngeal residue - valleculae;Pharyngeal residue - pyriform sinuses;Pharyngeal residue - posterior pharnyx;Reduced tongue base retraction;Delayed swallow initiation;Trace aspiration;Penetration/Aspiration before swallow  Penetration/Aspiration details (nectar cup) Material enters airway, passes BELOW cords without attempt by patient to eject out (silent aspiration)  Pharyngeal - Thin Cup Premature spillage to valleculae;Premature spillage to pyriform sinuses;Reduced pharyngeal peristalsis;Reduced tongue base retraction;Delayed swallow initiation;Moderate aspiration;Penetration/Aspiration before swallow;Pharyngeal residue - valleculae;Pharyngeal residue - pyriform sinuses  Penetration/Aspiration details (thin cup) Material enters airway, passes BELOW cords and not ejected out despite cough attempt by patient  Pharyngeal - Thin Straw Premature spillage to pyriform sinuses;Premature spillage to valleculae;Reduced pharyngeal peristalsis;Reduced tongue base retraction;Delayed swallow initiation;Penetration/Aspiration during swallow;Penetration/Aspiration before swallow;Pharyngeal residue - valleculae;Pharyngeal residue - pyriform sinuses  Penetration/Aspiration details (thin straw) Material enters airway, remains ABOVE vocal cords and not ejected out  Pharyngeal - Puree Reduced tongue base retraction;Delayed swallow initiation  Pharyngeal - Mechanical Soft Pharyngeal residue - valleculae;Delayed swallow initiation;Reduced pharyngeal peristalsis;Reduced tongue base retraction;Pharyngeal  residue - pyriform sinuses  Pharyngeal Comment head turn left not helpful (pt's weak side) to prevent residuals, cued dry swallows minimally effective to decrease residuals, pt unable to expectorate to clear pharyngeal residuals  - due to weakness     CHL IP CERVICAL ESOPHAGEAL PHASE 09/25/2014  Cervical Esophageal Phase Impaired  Honey Teaspoon   Honey Cup   Nectar Teaspoon   Nectar Cup   Nectar Straw   Thin Teaspoon   Thin Cup   Thin Straw   Cervical Esophageal Comment appearance of decreased upper esophageal opening adding to pharyngeal residuals    No flowsheet data found.         Luanna Salk, [facility] SLP [phone]

## 2014-09-26 DIAGNOSIS — Z515 Encounter for palliative care: Secondary | ICD-10-CM | POA: Diagnosis present

## 2014-09-26 MED ORDER — DEXTROSE-NACL 5-0.45 % IV SOLN
INTRAVENOUS | Status: DC
  Administered 2014-09-26 – 2014-09-28 (×5): via INTRAVENOUS

## 2014-09-26 NOTE — Progress Notes (Signed)
-  [facility] PROGRESS NOTE Assessment/Plan: Aspiration pneumonia/  Dysphagia/acute respiratory failure with hypoxia: - He's had recurrent episode of aspiration, saturations have remained stable. - He was started empirically on Zosyn on admission, we'll de-escalate to Unasyn. - Awaiting palliative care recommendations. - Swallowing evaluation done recommended only ice chips.  Protein-calorie malnutrition, severe - nutrition.  Paroxysmal a-fib: - Patient not appropriate for anticoagulation per H&P, rate controlled in sinus rhythm.  History of CVA (cerebrovascular accident): - cont ASA  Anemia of chronic disease: - At baseline most likely due to cranial disease  Dementia: - Healthcare power of attorney is Ms. [name] phone [phone]   Seizure disorder: - Continue Keppra no changes were made.  Essential hypertension, benign: - Held oral antihypertensive medications using hydralazine for systolic blood pressure greater than 180.   Code Status: FULL DVT Prophylaxis: Hep Family Communication: none Disposition Plan: pending evaluation and improvement in medical condition   Consultants:  PMT  Procedures:  SLP  Antibiotics:  unasyn  HPI/Subjective: No complains.  Objective: Filed Vitals:   09/25/14 0622 09/25/14 1322 09/25/14 2208 09/26/14 0504  BP: 123/59 102/51 146/103 155/67  Pulse: 82 74 73 75  Temp: 97.9 F (36.6 C) 98.4 F (36.9 C) 97.5 F (36.4 C) 97.6 F (36.4 C)  TempSrc: Oral Oral Axillary Oral  Resp: 16 18 16 16   Height:      Weight:      SpO2: 100% 99% 98% 100%    Intake/Output Summary (Last 24 hours) at 09/26/14 0916 Last data filed at 09/26/14 0700  Gross per 24 hour  Intake 3213.33 ml  Output   1150 ml  Net 2063.33 ml   Filed Weights   09/23/14 2004  Weight: 57.607 kg (127 lb)   - Exam:  General: , in no acute distress.  HEENT: No bruits, no goiter.  Heart: Regular rate and rhythm. Lungs: Good air movement, right  side crackles. Abdomen: Soft, nontender, nondistended, positive bowel sounds.  Neuro: Grossly intact, nonfocal.   Data Reviewed: Basic Metabolic Panel:  Recent Labs Lab 09/23/14 1653 09/24/14 0450  NA 136 133*  K 3.8 4.2  CL 101 101  CO2 25 22  GLUCOSE 103* 96  BUN 12 11  CREATININE 0.85 0.79  CALCIUM 8.4 8.3*   Liver Function Tests: No results for input(s): AST, ALT, ALKPHOS, BILITOT, PROT, ALBUMIN in the last 168 hours. No results for input(s): LIPASE, AMYLASE in the last 168 hours. No results for input(s): AMMONIA in the last 168 hours. CBC:  Recent Labs Lab 09/23/14 1653 09/24/14 0450  WBC 4.9 4.4  NEUTROABS 3.0  --   HGB 10.9* 12.6*  HCT 35.4* 39.6  MCV 89.8 88.4  PLT 194 178   Cardiac Enzymes: No results for input(s): CKTOTAL, CKMB, CKMBINDEX, TROPONINI in the last 168 hours. BNP (last 3 results) No results for input(s): BNP in the last 8760 hours.  ProBNP (last 3 results) No results for input(s): PROBNP in the last 8760 hours.  CBG: No results for input(s): GLUCAP in the last 168 hours.  Recent Results (from the past 240 hour(s))  Culture, blood (routine x 2)      Status: None (Preliminary result)   Collection Time: 09/23/14  5:11 PM  Result Value Ref Range Status   Specimen Description BLOOD RIGHT FOREARM  Final   Special Requests BOTTLES DRAWN AEROBIC AND ANAEROBIC 5ML  Final   Culture   Final           BLOOD CULTURE RECEIVED  NO GROWTH TO DATE CULTURE WILL BE HELD FOR 5 DAYS BEFORE ISSUING A FINAL NEGATIVE REPORT Performed at [facility]    Report Status PENDING  Incomplete  Culture, blood (routine x 2)      Status: None (Preliminary result)   Collection Time: 09/23/14  5:11 PM  Result Value Ref Range Status   Specimen Description BLOOD RAC  Final   Special Requests BOTTLES DRAWN AEROBIC AND ANAEROBIC 5ML  Final   Culture   Final           BLOOD CULTURE RECEIVED NO GROWTH TO DATE CULTURE WILL BE HELD FOR 5 DAYS BEFORE ISSUING A FINAL  NEGATIVE REPORT Performed at [facility]    Report Status PENDING  Incomplete  MRSA PCR Screening     Status: Abnormal   Collection Time: 09/23/14 10:37 PM  Result Value Ref Range Status   MRSA by PCR POSITIVE (A) NEGATIVE Final    Comment:        The GeneXpert MRSA Assay (FDA approved for NASAL specimens only), is one component of a comprehensive MRSA colonization surveillance program. It is not intended to diagnose MRSA infection nor to guide or monitor treatment for MRSA infections. RESULT CALLED TO, READ BACK BY AND VERIFIED WITH: L.REINERT,RN AT 0147 ON 09/24/14 BY W.SHEA,MT      Studies: Dg Swallowing Func-speech Pathology  09/25/2014   Narda Rutherford, CCC-SLP     09/25/2014  1:15 PM  Objective Swallowing Evaluation: Modified Barium Swallowing Study   Patient Details  Name: Ronald Sutton MRN: [id number] Date of Birth: Apr 29, 1931  Today's Date: 09/25/2014 Time: SLP Start Time (ACUTE ONLY): 1210-SLP Stop Time (ACUTE  ONLY): 1245 SLP Time Calculation (min) (ACUTE ONLY): 35 min  Past Medical History:  Past Medical History  Diagnosis Date  . Hypertension   . Diabetes mellitus without complication   . CVA (cerebral infarction)     Right-sided hemiparesis  . Seizures   . A-fib   . Anemia   . Arthritis     osteoarthritis  . Cancer     Oral cancer?  . Dementia    Past Surgical History:  Past Surgical History  Procedure Laterality Date  . Cholecystectomy    . 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  2005), CVA and seizure.  Swallow evaluation ordered.  Pt wth  admit for fall in 07/2013.    No  Data Recorded  Assessment / Plan / Recommendation CHL IP CLINICAL IMPRESSIONS 09/25/2014  Dysphagia Diagnosis Moderate oral phase dysphagia;Severe oral  phase dysphagia;Moderate pharyngeal phase dysphagia;Severe  pharyngeal phase dysphagia;Moderate cervical esophageal phase  dysphagia;Severe cervical esophageal phase dysphagia  Clinical impression Moderately severe oropharyngeal and cervical  esophageal dysphagia with sensorimotor deficits due to pt's h/o  pharyngeal cancer, CVA, and cerebellar atrophy.  Lingual weakness  resulted in delayed oral transiting, premature spillage into  pharynx uncontrolled and residuals.  Pharyngeal swallow  characterized by delay, decreased tongue base retraction and poor  proximal esophageal opening resulting in residuals and  aspiration.  Pt with oropharyngeal residuals across consistencies  without adequate ability  to clear.    He likely has been experiencing chronic aspiration (dating back  to at least 2005) with decreased tolerance due to advancing age,  deconditioning etc.  He will aspirate regardless of consistency -  and if pt goals become comfort (note PMT referral) = would defer  to him for dietary desires.    Recommend NPO except small single ice boluses with adequate oral  care until determination regarding goals is made.  Educated pt  that feeding tube will not prevent aspiration.  Using live video,  SLP educated pt to findings.        CHL IP TREATMENT RECOMMENDATION 09/25/2014  Treatment Plan Recommendations Therapy as outlined in treatment  plan below     CHL IP DIET RECOMMENDATION 09/25/2014  Diet Recommendations NPO  Liquid Administration via (None)  Medication Administration (None)  Compensations (None)  Postural Changes and/or Swallow Maneuvers (None)     CHL IP OTHER RECOMMENDATIONS 09/25/2014  Recommended Consults (None)  Oral Care Recommendations Oral care Q4 per protocol;Oral care  prior to ice chips  Other Recommendations Have oral suction available     CHL IP  FOLLOW UP RECOMMENDATIONS 09/25/2014  Follow up Recommendations (No Data)     CHL IP FREQUENCY AND DURATION 09/25/2014 Speech Therapy Frequency  (ACUTE ONLY) min 1 x/week  Treatment Duration 1 week         CHL IP REASON FOR REFERRAL 09/25/2014  Reason for Referral Objectively evaluate swallowing function     CHL IP ORAL PHASE 09/25/2014  Oral Phase Impaired  Oral - Honey Teaspoon Piecemeal swallowing;Reduced posterior  propulsion;Weak lingual manipulation;Delayed oral  transit;Lingual/palatal residue  Oral - Honey Cup Reduced posterior propulsion;Piecemeal  swallowing;Delayed oral transit;Weak lingual  manipulation;Lingual/palatal residue  Oral - Honey Syringe (None)  Oral - Nectar Teaspoon Reduced posterior propulsion;Piecemeal  swallowing;Delayed oral transit;Weak lingual  manipulation;Lingual/palatal residue  Oral - Nectar Cup Reduced posterior propulsion;Piecemeal  swallowing;Delayed oral transit;Weak lingual  manipulation;Lingual/palatal residue;Left anterior bolus loss  Oral - Thin Teaspoon Reduced posterior propulsion;Piecemeal  swallowing;Delayed oral transit;Weak lingual  manipulation;Lingual/palatal residue;Left anterior bolus loss  Oral - Thin Cup Reduced posterior propulsion;Piecemeal  swallowing;Delayed oral transit;Weak lingual  manipulation;Lingual/palatal residue;Left anterior bolus loss  Oral - Thin Straw Reduced posterior propulsion;Piecemeal  swallowing;Delayed oral transit;Weak lingual  manipulation;Lingual/palatal residue  Oral - Thin Syringe (None)  Oral - Puree Reduced posterior propulsion;Piecemeal  swallowing;Delayed oral transit;Weak lingual  manipulation;Lingual/palatal residue  Oral - Mechanical Soft Reduced posterior propulsion;Piecemeal  swallowing;Delayed oral transit;Lingual/palatal residue  Oral - Regular (None)  Oral - Multi-consistency (None)  Oral - Pill (None)  Oral Phase - Comment premature spillage of barium into pharynx  uncontrolled      CHL IP PHARYNGEAL PHASE 09/25/2014   Pharyngeal Phase Impaired  Pharyngeal - Honey Teaspoon Premature spillage to  valleculae;Premature spillage to pyriform sinuses;Reduced  pharyngeal peristalsis;Pharyngeal residue - valleculae;Pharyngeal  residue - pyriform sinuses;Reduced tongue base retraction;Delayed  swallow initiation  Pharyngeal - Honey Cup Premature spillage to pyriform  sinuses;Premature spillage to valleculae;Reduced pharyngeal  peristalsis;Pharyngeal residue - valleculae;Pharyngeal residue -  pyriform sinuses;Reduced tongue base retraction;Delayed swallow  initiation  Pharyngeal - Nectar Teaspoon Premature spillage to  valleculae;Premature spillage to pyriform sinuses;Reduced  pharyngeal peristalsis;Pharyngeal residue - valleculae;Pharyngeal  residue - pyriform sinuses;Reduced tongue base retraction;Delayed  swallow initiation;Penetration/Aspiration during  swallow;Penetration/Aspiration after swallow  Penetration/Aspiration details (nectar teaspoon) Material enters  airway, CONTACTS cords and not ejected out  Pharyngeal - Nectar Cup Premature spillage to pyriform  sinuses;Premature spillage to valleculae;Reduced pharyngeal  peristalsis;Pharyngeal residue -  valleculae;Pharyngeal residue -  pyriform sinuses;Pharyngeal residue - posterior pharnyx;Reduced  tongue base retraction;Delayed swallow initiation;Trace  aspiration;Penetration/Aspiration before swallow  Penetration/Aspiration details (nectar cup) Material enters  airway, passes BELOW cords without attempt by patient to eject  out (silent aspiration)  Pharyngeal - Thin Cup Premature spillage to valleculae;Premature  spillage to pyriform sinuses;Reduced pharyngeal  peristalsis;Reduced tongue base retraction;Delayed swallow  initiation;Moderate aspiration;Penetration/Aspiration before  swallow;Pharyngeal residue - valleculae;Pharyngeal residue -  pyriform sinuses  Penetration/Aspiration details (thin cup) Material enters airway,  passes BELOW cords and not ejected out despite cough  attempt by  patient  Pharyngeal - Thin Straw Premature spillage to pyriform  sinuses;Premature spillage to valleculae;Reduced pharyngeal  peristalsis;Reduced tongue base retraction;Delayed swallow  initiation;Penetration/Aspiration during  swallow;Penetration/Aspiration before swallow;Pharyngeal residue  - valleculae;Pharyngeal residue - pyriform sinuses  Penetration/Aspiration details (thin straw) Material enters  airway, remains ABOVE vocal cords and not ejected out  Pharyngeal - Puree Reduced tongue base retraction;Delayed swallow  initiation  Pharyngeal - Mechanical Soft Pharyngeal residue -  valleculae;Delayed swallow initiation;Reduced pharyngeal  peristalsis;Reduced tongue base retraction;Pharyngeal residue -  pyriform sinuses  Pharyngeal Comment head turn left not helpful (pt's weak side) to  prevent residuals, cued dry swallows minimally effective to  decrease residuals, pt unable to expectorate to clear pharyngeal  residuals  - due to weakness     CHL IP CERVICAL ESOPHAGEAL PHASE 09/25/2014  Cervical Esophageal Phase Impaired  Honey Teaspoon   Honey Cup   Nectar Teaspoon   Nectar Cup   Nectar Straw   Thin Teaspoon  Thin Cup   Thin Straw   Cervical Esophageal Comment appearance of decreased upper  esophageal opening adding to pharyngeal residuals    No flowsheet data found.         Luanna Salk, MS [facility] SLP [phone]     Scheduled Meds: . ampicillin-sulbactam (UNASYN) IV  1.5 g Intravenous Q6H  . antiseptic oral rinse  7 mL Mouth Rinse q12n4p  . aspirin  300 mg Rectal Daily  . chlorhexidine  15 mL Mouth Rinse BID  . heparin  5,000 Units Subcutaneous 3 times per day  . levETIRAcetam  500 mg Intravenous Q12H  . LORazepam  1 mg Intravenous Once  . sodium chloride  3 mL Intravenous Q12H   Continuous Infusions: . sodium chloride 100 mL/hr at 09/26/14 0004     Charlynne Cousins  [facility] Pager [phone]. If 7PM-7AM, please contact night-coverage at [email or web address], password  [facility] 09/26/2014, 9:16 AM  LOS: 3 days

## 2014-09-26 NOTE — Consult Note (Signed)
Patient Ronald Sutton      DOB: 08/15/1930      UMP:[id number]     Consult Note from the Palliative Medicine Team at [place] Requested by: Dr Aileen Fass     PCP: Cyndee Brightly, MD Reason for [place]     Phone Number:[phone]  Assessment/Recommendations: 81 yo male with dementia, sz d/o, h/o CVA and presumed aspiration  1.  Code Status: Full  2. GOC:  Patient does not have capacity. I will meet with his sister Ronald Sutton tomorrow at 1PM to discuss situation further.   3. Symptom Management:   1. Dysphagia/Aspiration- in setting of advanced dementia.  I agree with other sentiments of physicians here and SLP that PEG tube is not likely to decrease this risk and I would argue not indicated.  Literature does not support that patients with dementia have less aspiration or improved survival with PEGs in this situation.    4. Psychosocial/Spiritual: Sister Ronald Sutton listed as POA.  I do not see official documentation of this other than on white board.  I will meet with her tomorrow     Brief HPI: Ronald Sutton is an 81 yo male with PMHx of Dementia, CVA's, sz d/o who presented from [facility] with hypoxia and concern for possible episode of aspiration. Patient unable to provide any reliable history and HPI is from record review.  Reportedly was started on levaquin prior to admit on 3/14 for aspiration concern and hypoxia persisted.  Reportedly sent to [facility] for eval of PEG tube placement. Had swallow eval here with severe dysphagia and high risk for ongoing aspiration.  Currently on abx and palliative consulted for [place]. Patient ROS unreliable but denies pain, dyspnea, hunger, discomfort.   ROS: Unable to obtain 2/2 dementia    PMH:  Past Medical History  Diagnosis Date  . Hypertension   . Diabetes mellitus without complication   . CVA (cerebral infarction)     Right-sided hemiparesis  . Seizures   . A-fib   . Anemia   . Arthritis    osteoarthritis  . Cancer     Oral cancer?  . Dementia      PSH: Past Surgical History  Procedure Laterality Date  . Cholecystectomy    . Hip pinning,cannulated Right 08/06/2013    Procedure: CANNULATED HIP PINNING ;  Surgeon: Mcarthur Rossetti, MD;  Location: [facility];  Service: Orthopedics;  Laterality: Right;   I have reviewed the [place] and SH and  If appropriate update it with new information. No Known Allergies Scheduled Meds: . ampicillin-sulbactam (UNASYN) IV  1.5 g Intravenous Q6H  . antiseptic oral rinse  7 mL Mouth Rinse q12n4p  . aspirin  300 mg Rectal Daily  . chlorhexidine  15 mL Mouth Rinse BID  . heparin  5,000 Units Subcutaneous 3 times per day  . levETIRAcetam  500 mg Intravenous Q12H  . LORazepam  1 mg Intravenous Once  . sodium chloride  3 mL Intravenous Q12H   Continuous Infusions: . dextrose 5 % and 0.45% NaCl 75 mL/hr at 09/26/14 1003   PRN Meds:.hydrALAZINE, ondansetron **OR** [DISCONTINUED] ondansetron (ZOFRAN) IV    BP 159/85 mmHg  Pulse 76  Temp(Src) 98 F (36.7 C) (Oral)  Resp 16  Ht 5\' 8"  (1.727 m)  Wt 57.607 kg (127 lb)  BMI 19.31 kg/m2  SpO2 100%   PPS: 30-40   Intake/Output Summary (Last 24 hours) at 09/26/14 1440 Last data filed at 09/26/14 1311  Gross per  24 hour  Intake 3213.33 ml  Output   2350 ml  Net 863.33 ml   Physical Exam:  General: Alert, NAD HEENT:  [place], sclera anicteric Neck: supple Chest:   CTAB CVS: RRR Abdomen:soft, ND Ext: no edema Neuro:oriented to self only  Labs: CBC    Component Value Date/Time   WBC 4.4 09/24/2014 0450   WBC 6.4 11/28/2009 1404   RBC 4.48 09/24/2014 0450   RBC 4.12* 09/23/2014 2110   RBC 4.36 11/28/2009 1404   HGB 12.6* 09/24/2014 0450   HGB 12.6* 11/28/2009 1404   HCT 39.6 09/24/2014 0450   HCT 37.6* 11/28/2009 1404   PLT 178 09/24/2014 0450   PLT 219 11/28/2009 1404   MCV 88.4 09/24/2014 0450   MCV 86.2 11/28/2009 1404   MCH 28.1 09/24/2014 0450   MCH 28.9 11/28/2009  1404   MCHC 31.8 09/24/2014 0450   MCHC 33.5 11/28/2009 1404   RDW 11.6 09/24/2014 0450   RDW 11.6 11/28/2009 1404   LYMPHSABS 1.2 09/23/2014 1653   LYMPHSABS 1.5 11/28/2009 1404   MONOABS 0.5 09/23/2014 1653   MONOABS 0.6 11/28/2009 1404   EOSABS 0.2 09/23/2014 1653   EOSABS 0.5 11/28/2009 1404   BASOSABS 0.0 09/23/2014 1653   BASOSABS 0.1 11/28/2009 1404    BMET    Component Value Date/Time   NA 133* 09/24/2014 0450   K 4.2 09/24/2014 0450   CL 101 09/24/2014 0450   CO2 22 09/24/2014 0450   GLUCOSE 96 09/24/2014 0450   BUN 11 09/24/2014 0450   CREATININE 0.79 09/24/2014 0450   CALCIUM 8.3* 09/24/2014 0450   GFRNONAA 82* 09/24/2014 0450   GFRAA >90 09/24/2014 0450    CMP     Component Value Date/Time   NA 133* 09/24/2014 0450   K 4.2 09/24/2014 0450   CL 101 09/24/2014 0450   CO2 22 09/24/2014 0450   GLUCOSE 96 09/24/2014 0450   BUN 11 09/24/2014 0450   CREATININE 0.79 09/24/2014 0450   CALCIUM 8.3* 09/24/2014 0450   PROT 6.6 11/28/2009 1404   ALBUMIN 4.2 11/28/2009 1404   AST 17 11/28/2009 1404   ALT 8 11/28/2009 1404   ALKPHOS 73 11/28/2009 1404   BILITOT 0.4 11/28/2009 1404   GFRNONAA 82* 09/24/2014 0450   GFRAA >90 09/24/2014 0450   3/14 CXR IMPRESSION: Patchy pneumonia involving the lung bases, likely the lower lobes and right middle lobe, query aspiration.  Total Time: 40 minutes Greater than 50%  of this time was spent counseling and coordinating care related to the above assessment and plan.  Doran Clay D.O. Palliative Medicine Team at [facility]  Pager: [phone] Team Phone: [phone]

## 2014-09-27 DIAGNOSIS — E440 Moderate protein-calorie malnutrition: Secondary | ICD-10-CM

## 2014-09-27 LAB — CLOSTRIDIUM DIFFICILE BY PCR: Toxigenic C. Difficile by PCR: NEGATIVE

## 2014-09-27 MED ORDER — SODIUM CHLORIDE 0.9 % IV BOLUS (SEPSIS)
500.0000 mL | Freq: Once | INTRAVENOUS | Status: AC
  Administered 2014-09-27: 500 mL via INTRAVENOUS

## 2014-09-27 MED ORDER — LEVETIRACETAM IN NACL 750 MG/50ML IV SOLN
750.0000 mg | Freq: Two times a day (BID) | INTRAVENOUS | Status: DC
  Administered 2014-09-27 – 2014-09-29 (×5): 750 mg via INTRAVENOUS
  Filled 2014-09-27 (×9): qty 50

## 2014-09-27 NOTE — Progress Notes (Signed)
[facility] PROGRESS NOTE Assessment/Plan: Aspiration pneumonia/  Dysphagia/acute respiratory failure with hypoxia: - With Recurrent episode of aspiration, saturations have remained stable. - Cont to Unasyn. - Appreciate palliative care recommendations. Meeting with Health POA - Swallowing evaluation done recommended only ice chips. g-tube does will not eliminate changes of aspirations.  Protein-calorie malnutrition, severe - nutrition contsult.  Paroxysmal a-fib: - Patient not appropriate for anticoagulation per H&P, rate controlled in sinus rhythm.  History of CVA (cerebrovascular accident): - cont ASA  Anemia of chronic disease: - At baseline most likely due to chronic kidney disease  Dementia: - Healthcare power of attorney is Ms. [name] phone [phone]   Seizure disorder: - Continue Keppra no changes were made.  Essential hypertension, benign: - Held oral antihypertensive medications using hydralazine for systolic blood pressure greater than 180.   Code Status: FULL DVT Prophylaxis: Hep Family Communication: none Disposition Plan: pending evaluation and improvement in medical condition   Consultants:  PMT  Procedures:  SLP  Antibiotics:  unasyn  HPI/Subjective: No complains.  Objective: Filed Vitals:   09/26/14 2300 09/27/14 0430 09/27/14 0452 09/27/14 0538  BP: 155/75 79/44 82/42  140/56  Pulse:  67 71 71  Temp:      TempSrc:      Resp:      Height:      Weight:      SpO2:  99%      Intake/Output Summary (Last 24 hours) at 09/27/14 0827 Last data filed at 09/26/14 1935  Gross per 24 hour  Intake 771.25 ml  Output   1900 ml  Net -1128.75 ml   Filed Weights   09/23/14 2004  Weight: 57.607 kg (127 lb)   - Exam:  General: , in no acute distress.  HEENT: No bruits, no goiter.  Heart: Regular rate and rhythm. Lungs: Good air movement, right side crackles. Abdomen: Soft, nontender, nondistended, positive bowel sounds.    Neuro: Grossly intact, nonfocal.   Data Reviewed: Basic Metabolic Panel:  Recent Labs Lab 09/23/14 1653 09/24/14 0450  NA 136 133*  K 3.8 4.2  CL 101 101  CO2 25 22  GLUCOSE 103* 96  BUN 12 11  CREATININE 0.85 0.79  CALCIUM 8.4 8.3*   Liver Function Tests: No results for input(s): AST, ALT, ALKPHOS, BILITOT, PROT, ALBUMIN in the last 168 hours. No results for input(s): LIPASE, AMYLASE in the last 168 hours. No results for input(s): AMMONIA in the last 168 hours. CBC:  Recent Labs Lab 09/23/14 1653 09/24/14 0450  WBC 4.9 4.4  NEUTROABS 3.0  --   HGB 10.9* 12.6*  HCT 35.4* 39.6  MCV 89.8 88.4  PLT 194 178   Cardiac Enzymes: No results for input(s): CKTOTAL, CKMB, CKMBINDEX, TROPONINI in the last 168 hours. BNP (last 3 results) No results for input(s): BNP in the last 8760 hours.  ProBNP (last 3 results) No results for input(s): PROBNP in the last 8760 hours.  CBG: No results for input(s): GLUCAP in the last 168 hours.  Recent Results (from the past 240 hour(s))  Culture, blood (routine x 2)      Status: None (Preliminary result)   Collection Time: 09/23/14  5:11 PM  Result Value Ref Range Status   Specimen Description BLOOD RIGHT FOREARM  Final   Special Requests BOTTLES DRAWN AEROBIC AND ANAEROBIC 5ML  Final   Culture   Final           BLOOD CULTURE RECEIVED NO GROWTH TO DATE CULTURE WILL BE HELD  FOR 5 DAYS BEFORE ISSUING A FINAL NEGATIVE REPORT Performed at [facility]    Report Status PENDING  Incomplete  Culture, blood (routine x 2)      Status: None (Preliminary result)   Collection Time: 09/23/14  5:11 PM  Result Value Ref Range Status   Specimen Description BLOOD RAC  Final   Special Requests BOTTLES DRAWN AEROBIC AND ANAEROBIC 5ML  Final   Culture   Final           BLOOD CULTURE RECEIVED NO GROWTH TO DATE CULTURE WILL BE HELD FOR 5 DAYS BEFORE ISSUING A FINAL NEGATIVE REPORT Performed at [facility]    Report Status PENDING   Incomplete  MRSA PCR Screening     Status: Abnormal   Collection Time: 09/23/14 10:37 PM  Result Value Ref Range Status   MRSA by PCR POSITIVE (A) NEGATIVE Final    Comment:        The GeneXpert MRSA Assay (FDA approved for NASAL specimens only), is one component of a comprehensive MRSA colonization surveillance program. It is not intended to diagnose MRSA infection nor to guide or monitor treatment for MRSA infections. RESULT CALLED TO, READ BACK BY AND VERIFIED WITH: L.REINERT,RN AT 0147 ON 09/24/14 BY W.SHEA,MT      Studies: Dg Swallowing Func-speech Pathology  09/25/2014   Narda Rutherford, CCC-SLP     09/25/2014  1:15 PM  Objective Swallowing Evaluation: Modified Barium Swallowing Study   Patient Details  Name: Ronald Sutton MRN: [id number] Date of Birth: 01/22/1931  Today's Date: 09/25/2014 Time: SLP Start Time (ACUTE ONLY): 1210-SLP Stop Time (ACUTE  ONLY): 1245 SLP Time Calculation (min) (ACUTE ONLY): 35 min  Past Medical History:  Past Medical History  Diagnosis Date  . Hypertension   . Diabetes mellitus without complication   . CVA (cerebral infarction)     Right-sided hemiparesis  . Seizures   . A-fib   . Anemia   . Arthritis     osteoarthritis  . Cancer     Oral cancer?  . Dementia    Past Surgical History:  Past Surgical History  Procedure Laterality Date  . Cholecystectomy    . 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  Swallow evaluation ordered.  Pt wth  admit for fall in 07/2013.    No Data Recorded  Assessment / Plan / Recommendation CHL IP CLINICAL IMPRESSIONS  09/25/2014  Dysphagia Diagnosis Moderate oral phase dysphagia;Severe oral  phase dysphagia;Moderate pharyngeal phase dysphagia;Severe  pharyngeal phase dysphagia;Moderate cervical esophageal phase  dysphagia;Severe cervical esophageal phase dysphagia  Clinical impression Moderately severe oropharyngeal and cervical  esophageal dysphagia with sensorimotor deficits due to pt's h/o  pharyngeal cancer, CVA, and cerebellar atrophy.  Lingual weakness  resulted in delayed oral transiting, premature spillage into  pharynx uncontrolled and residuals.  Pharyngeal swallow  characterized by delay, decreased tongue base retraction and poor  proximal esophageal opening resulting in residuals and  aspiration.  Pt with oropharyngeal residuals across consistencies  without adequate ability to clear.    He likely has  been experiencing chronic aspiration (dating back  to at least 2005) with decreased tolerance due to advancing age,  deconditioning etc.  He will aspirate regardless of consistency -  and if pt goals become comfort (note PMT referral) = would defer  to him for dietary desires.    Recommend NPO except small single ice boluses with adequate oral  care until determination regarding goals is made.  Educated pt  that feeding tube will not prevent aspiration.  Using live video,  SLP educated pt to findings.        CHL IP TREATMENT RECOMMENDATION 09/25/2014  Treatment Plan Recommendations Therapy as outlined in treatment  plan below     CHL IP DIET RECOMMENDATION 09/25/2014  Diet Recommendations NPO  Liquid Administration via (None)  Medication Administration (None)  Compensations (None)  Postural Changes and/or Swallow Maneuvers (None)     CHL IP OTHER RECOMMENDATIONS 09/25/2014  Recommended Consults (None)  Oral Care Recommendations Oral care Q4 per protocol;Oral care  prior to ice chips  Other Recommendations Have oral suction available     CHL IP FOLLOW UP RECOMMENDATIONS 09/25/2014  Follow up Recommendations (No Data)     CHL  IP FREQUENCY AND DURATION 09/25/2014 Speech Therapy Frequency  (ACUTE ONLY) min 1 x/week  Treatment Duration 1 week         CHL IP REASON FOR REFERRAL 09/25/2014  Reason for Referral Objectively evaluate swallowing function     CHL IP ORAL PHASE 09/25/2014  Oral Phase Impaired  Oral - Honey Teaspoon Piecemeal swallowing;Reduced posterior  propulsion;Weak lingual manipulation;Delayed oral  transit;Lingual/palatal residue  Oral - Honey Cup Reduced posterior propulsion;Piecemeal  swallowing;Delayed oral transit;Weak lingual  manipulation;Lingual/palatal residue  Oral - Honey Syringe (None)  Oral - Nectar Teaspoon Reduced posterior propulsion;Piecemeal  swallowing;Delayed oral transit;Weak lingual  manipulation;Lingual/palatal residue  Oral - Nectar Cup Reduced posterior propulsion;Piecemeal  swallowing;Delayed oral transit;Weak lingual  manipulation;Lingual/palatal residue;Left anterior bolus loss  Oral - Thin Teaspoon Reduced posterior propulsion;Piecemeal  swallowing;Delayed oral transit;Weak lingual  manipulation;Lingual/palatal residue;Left anterior bolus loss  Oral - Thin Cup Reduced posterior propulsion;Piecemeal  swallowing;Delayed oral transit;Weak lingual  manipulation;Lingual/palatal residue;Left anterior bolus loss  Oral - Thin Straw Reduced posterior propulsion;Piecemeal  swallowing;Delayed oral transit;Weak lingual  manipulation;Lingual/palatal residue  Oral - Thin Syringe (None)  Oral - Puree Reduced posterior propulsion;Piecemeal  swallowing;Delayed oral transit;Weak lingual  manipulation;Lingual/palatal residue  Oral - Mechanical Soft Reduced posterior propulsion;Piecemeal  swallowing;Delayed oral transit;Lingual/palatal residue  Oral - Regular (None)  Oral - Multi-consistency (None)  Oral - Pill (None)  Oral Phase - Comment premature spillage of barium into pharynx  uncontrolled      CHL IP PHARYNGEAL PHASE 09/25/2014  Pharyngeal Phase Impaired  Pharyngeal - Honey Teaspoon Premature spillage to   valleculae;Premature spillage to pyriform sinuses;Reduced  pharyngeal peristalsis;Pharyngeal residue - valleculae;Pharyngeal  residue - pyriform sinuses;Reduced tongue base retraction;Delayed  swallow initiation  Pharyngeal - Honey Cup Premature spillage to pyriform  sinuses;Premature spillage to valleculae;Reduced pharyngeal  peristalsis;Pharyngeal residue - valleculae;Pharyngeal residue -  pyriform sinuses;Reduced tongue base retraction;Delayed swallow  initiation  Pharyngeal - Nectar Teaspoon Premature spillage to  valleculae;Premature spillage to pyriform sinuses;Reduced  pharyngeal peristalsis;Pharyngeal residue - valleculae;Pharyngeal  residue - pyriform sinuses;Reduced tongue base retraction;Delayed  swallow initiation;Penetration/Aspiration during  swallow;Penetration/Aspiration after swallow  Penetration/Aspiration details (nectar teaspoon) Material enters  airway, CONTACTS cords and not ejected out  Pharyngeal - Nectar Cup Premature spillage to pyriform  sinuses;Premature spillage to valleculae;Reduced pharyngeal  peristalsis;Pharyngeal residue - valleculae;Pharyngeal residue -  pyriform sinuses;Pharyngeal residue -  posterior pharnyx;Reduced  tongue base retraction;Delayed swallow initiation;Trace  aspiration;Penetration/Aspiration before swallow  Penetration/Aspiration details (nectar cup) Material enters  airway, passes BELOW cords without attempt by patient to eject  out (silent aspiration)  Pharyngeal - Thin Cup Premature spillage to valleculae;Premature  spillage to pyriform sinuses;Reduced pharyngeal  peristalsis;Reduced tongue base retraction;Delayed swallow  initiation;Moderate aspiration;Penetration/Aspiration before  swallow;Pharyngeal residue - valleculae;Pharyngeal residue -  pyriform sinuses  Penetration/Aspiration details (thin cup) Material enters airway,  passes BELOW cords and not ejected out despite cough attempt by  patient  Pharyngeal - Thin Straw Premature spillage to pyriform   sinuses;Premature spillage to valleculae;Reduced pharyngeal  peristalsis;Reduced tongue base retraction;Delayed swallow  initiation;Penetration/Aspiration during  swallow;Penetration/Aspiration before swallow;Pharyngeal residue  - valleculae;Pharyngeal residue - pyriform sinuses  Penetration/Aspiration details (thin straw) Material enters  airway, remains ABOVE vocal cords and not ejected out  Pharyngeal - Puree Reduced tongue base retraction;Delayed swallow  initiation  Pharyngeal - Mechanical Soft Pharyngeal residue -  valleculae;Delayed swallow initiation;Reduced pharyngeal  peristalsis;Reduced tongue base retraction;Pharyngeal residue -  pyriform sinuses  Pharyngeal Comment head turn left not helpful (pt's weak side) to  prevent residuals, cued dry swallows minimally effective to  decrease residuals, pt unable to expectorate to clear pharyngeal  residuals  - due to weakness     CHL IP CERVICAL ESOPHAGEAL PHASE 09/25/2014  Cervical Esophageal Phase Impaired  Honey Teaspoon   Honey Cup   Nectar Teaspoon   Nectar Cup   Nectar Straw   Thin Teaspoon  Thin Cup   Thin Straw   Cervical Esophageal Comment appearance of decreased upper  esophageal opening adding to pharyngeal residuals    No flowsheet data found.         Luanna Salk, MS [facility] SLP [phone]     Scheduled Meds: . ampicillin-sulbactam (UNASYN) IV  1.5 g Intravenous Q6H  . antiseptic oral rinse  7 mL Mouth Rinse q12n4p  . aspirin  300 mg Rectal Daily  . chlorhexidine  15 mL Mouth Rinse BID  . heparin  5,000 Units Subcutaneous 3 times per day  . levETIRAcetam  750 mg Intravenous Q12H  . LORazepam  1 mg Intravenous Once  . sodium chloride  3 mL Intravenous Q12H   Continuous Infusions: . dextrose 5 % and 0.45% NaCl 75 mL/hr at 09/26/14 2209     Charlynne Cousins  [facility] Pager [phone]. If 7PM-7AM, please contact night-coverage at [email or web address], password [facility] 09/27/2014, 8:27 AM  LOS: 4 days

## 2014-09-27 NOTE — Progress Notes (Addendum)
CSW following for return to [facility] when medically ready. Awaiting outcome of Palliative Care Consult. CSW has completed FL2 & will continue to follow and assist with return.   If ready over weekend, please contact weekend CSW, Rollene Fare (ph#: [phone]).    Raynaldo Opitz, [facility] Clinical Social Worker cell #: [phone]

## 2014-09-27 NOTE — Progress Notes (Signed)
Acute episode-  Pt had a 10-20 sec witnessed seizure activity while using the BSC. BP 79/44 KR 71 NSR in monitor. Patient arousable after the seizure, able to state name. On call NP made aware and new order received : NS 500 ml bolus and may give next dose of keppra now. Will carry out order. Will monitor patient closely.

## 2014-09-27 NOTE — Progress Notes (Signed)
SLP Cancellation Note  Patient Details Name: Ronald Sutton MRN: [id number] DOB: March 08, 1931   Cancelled treatment:       Reason Eval/Treat Not Completed: Other (comment) (await results of palliative meeting to establish pt and family goals)   Claudie Fisherman, [facility] SLP [phone]

## 2014-09-28 MED ORDER — BENAZEPRIL HCL 10 MG PO TABS
20.0000 mg | ORAL_TABLET | Freq: Every day | ORAL | Status: DC
  Administered 2014-09-28: 20 mg via ORAL
  Filled 2014-09-28: qty 2

## 2014-09-28 MED ORDER — VITAMINS A & D EX OINT
TOPICAL_OINTMENT | CUTANEOUS | Status: AC
  Filled 2014-09-28: qty 5

## 2014-09-28 MED ORDER — AMLODIPINE BESY-BENAZEPRIL HCL 10-20 MG PO CAPS: 1.0000 | ORAL_CAPSULE | Freq: Every day | ORAL | Status: DC

## 2014-09-28 MED ORDER — AMLODIPINE BESYLATE 10 MG PO TABS
10.0000 mg | ORAL_TABLET | Freq: Every day | ORAL | Status: DC
  Administered 2014-09-28: 10 mg via ORAL
  Filled 2014-09-28: qty 1

## 2014-09-28 MED ORDER — SODIUM CHLORIDE 0.9 % IV BOLUS (SEPSIS)
1000.0000 mL | Freq: Once | INTRAVENOUS | Status: AC
  Administered 2014-09-28: 1000 mL via INTRAVENOUS

## 2014-09-28 NOTE — Progress Notes (Addendum)
Patient Ronald Sutton      DOB: 1931-04-06      SAY:[id number]  LATE ENTRY FOR 09/27/14 Palliative Medicine Team at [facility] Progress Note    Subjective: Confused, denies pain, N/V   Filed Vitals:   09/28/14 0525  BP: 130/87  Pulse: 87  Temp: 98.1 F (36.7 C)  Resp: 18   Physical exam: General: Alert, NAD HEENT: [place], sclera anicteric Chest: CTAB CVS: RRR Abdomen:soft, ND Ext: no edema Neuro:oriented to self only   CBC    Component Value Date/Time   WBC 4.4 09/24/2014 0450   WBC 6.4 11/28/2009 1404   RBC 4.48 09/24/2014 0450   RBC 4.12* 09/23/2014 2110   RBC 4.36 11/28/2009 1404   HGB 12.6* 09/24/2014 0450   HGB 12.6* 11/28/2009 1404   HCT 39.6 09/24/2014 0450   HCT 37.6* 11/28/2009 1404   PLT 178 09/24/2014 0450   PLT 219 11/28/2009 1404   MCV 88.4 09/24/2014 0450   MCV 86.2 11/28/2009 1404   MCH 28.1 09/24/2014 0450   MCH 28.9 11/28/2009 1404   MCHC 31.8 09/24/2014 0450   MCHC 33.5 11/28/2009 1404   RDW 11.6 09/24/2014 0450   RDW 11.6 11/28/2009 1404   LYMPHSABS 1.2 09/23/2014 1653   LYMPHSABS 1.5 11/28/2009 1404   MONOABS 0.5 09/23/2014 1653   MONOABS 0.6 11/28/2009 1404   EOSABS 0.2 09/23/2014 1653   EOSABS 0.5 11/28/2009 1404   BASOSABS 0.0 09/23/2014 1653   BASOSABS 0.1 11/28/2009 1404    CMP     Component Value Date/Time   NA 133* 09/24/2014 0450   K 4.2 09/24/2014 0450   CL 101 09/24/2014 0450   CO2 22 09/24/2014 0450   GLUCOSE 96 09/24/2014 0450   BUN 11 09/24/2014 0450   CREATININE 0.79 09/24/2014 0450   CALCIUM 8.3* 09/24/2014 0450   PROT 6.6 11/28/2009 1404   ALBUMIN 4.2 11/28/2009 1404   AST 17 11/28/2009 1404   ALT 8 11/28/2009 1404   ALKPHOS 73 11/28/2009 1404   BILITOT 0.4 11/28/2009 1404   GFRNONAA 82* 09/24/2014 0450   GFRAA >90 09/24/2014 0450      Assessment and plan: 81 yo male with dementia, sz d/o, h/o CVA and presumed aspiration  1. Code Status:  DNR  2. GOC:  I met with sister Ronald Sutton and her  husband today to talk about Ronald Sutton. We talked about Dementia and what recurrent aspiration means in dementia.  We talked about how this is often a re-occurring problem and how PEG tubes do not really fix this problem.  After long discussion surrounding these issues and options regarding hospice care, they wish to pursue return to his nursing home with addition of hospice care. They do not advocate for pursuing PEG and feel it is appropriate for hospice to manage him at nursing home as he declines.  They do not feel we should transfer him back to hospital or use IV abx to treat future infections. They would be okay with oral abx, but do not want care escalated to point where he would need to return to hospital. If symptoms cant be controlled at nursing facility, hospice should be called to transfer to hospice facility.  He should be able to receive hospice care at nursing home because he is a long term care resident.    3. Symptom Management:  1. Dysphagia/Aspiration- based on above discussion, allow comfort feeds  4. Psychosocial/Spiritual: Sister Ronald Sutton listed as POA. they have 8 other siblings but all decisions are  deferred to Ronald Sutton and she only has help from her husband in decision making.  Has 1 son, but has been estranged from him for "years" and Ronald Sutton does not know how to reach him.     Total Time: 30 minutes >50% of time spent in counseling and coordination of care regarding above.   Doran Clay D.O. Palliative Medicine Team at [facility]  Pager: [phone] Team Phone: [phone]

## 2014-09-28 NOTE — Progress Notes (Signed)
Change in b/p 70/32 manually. Pt responds to name call and sternal rub. No other changes in initial am assessment. MD notified of change in bp

## 2014-09-28 NOTE — Progress Notes (Signed)
[facility] PROGRESS NOTE Assessment/Plan: Aspiration pneumonia/  Dysphagia/acute respiratory failure with hypoxia: - With Recurrent episode of aspiration, saturations have remained stable. - Cont to Unasyn. - Appreciate palliative care recommendations. Meeting with Health POA - Swallowing evaluation done recommended only ice chips, placed NPO, g-tube does will not eliminate changes of aspirations.  Protein-calorie malnutrition, severe  Paroxysmal a-fib: - Patient not appropriate for anticoagulation per H&P, rate controlled in sinus rhythm.  History of CVA (cerebrovascular accident): - cont ASA  Anemia of chronic disease: - At baseline most likely due to chronic kidney disease  Dementia: - Healthcare power of attorney is Ms. [name] phone [phone]   Seizure disorder: - Continue Keppra no changes were made.  Essential hypertension, benign: - Resume norvasc.   Code Status: FULL DVT Prophylaxis: Hep Family Communication: none Disposition Plan: pending evaluation and improvement in medical condition   Consultants:  PMT  Procedures:  SLP  Antibiotics:  unasyn  HPI/Subjective: No complains.  Objective: Filed Vitals:   09/27/14 0846 09/27/14 1401 09/27/14 2042 09/28/14 0525  BP:  157/58 174/78 130/87  Pulse:  80 84 87  Temp:  98.5 F (36.9 C) 98 F (36.7 C) 98.1 F (36.7 C)  TempSrc:  Oral Oral Oral  Resp:  18 18 18   Height:      Weight:      SpO2: 97% 98% 95% 98%    Intake/Output Summary (Last 24 hours) at 09/28/14 0909 Last data filed at 09/28/14 0537  Gross per 24 hour  Intake 2136.25 ml  Output   1550 ml  Net 586.25 ml   Filed Weights   09/23/14 2004  Weight: 57.607 kg (127 lb)   - Exam:  General: , in no acute distress.  HEENT: No bruits, no goiter.  Heart: Regular rate and rhythm. Lungs: Good air movement, right side crackles. Abdomen: Soft, nontender, nondistended, positive bowel sounds.  Neuro: Grossly intact,  nonfocal.   Data Reviewed: Basic Metabolic Panel:  Recent Labs Lab 09/23/14 1653 09/24/14 0450  NA 136 133*  K 3.8 4.2  CL 101 101  CO2 25 22  GLUCOSE 103* 96  BUN 12 11  CREATININE 0.85 0.79  CALCIUM 8.4 8.3*   Liver Function Tests: No results for input(s): AST, ALT, ALKPHOS, BILITOT, PROT, ALBUMIN in the last 168 hours. No results for input(s): LIPASE, AMYLASE in the last 168 hours. No results for input(s): AMMONIA in the last 168 hours. CBC:  Recent Labs Lab 09/23/14 1653 09/24/14 0450  WBC 4.9 4.4  NEUTROABS 3.0  --   HGB 10.9* 12.6*  HCT 35.4* 39.6  MCV 89.8 88.4  PLT 194 178   Cardiac Enzymes: No results for input(s): CKTOTAL, CKMB, CKMBINDEX, TROPONINI in the last 168 hours. BNP (last 3 results) No results for input(s): BNP in the last 8760 hours.  ProBNP (last 3 results) No results for input(s): PROBNP in the last 8760 hours.  CBG: No results for input(s): GLUCAP in the last 168 hours.  Recent Results (from the past 240 hour(s))  Culture, blood (routine x 2)      Status: None (Preliminary result)   Collection Time: 09/23/14  5:11 PM  Result Value Ref Range Status   Specimen Description BLOOD RIGHT FOREARM  Final   Special Requests BOTTLES DRAWN AEROBIC AND ANAEROBIC 5ML  Final   Culture   Final           BLOOD CULTURE RECEIVED NO GROWTH TO DATE CULTURE WILL BE HELD FOR 5 DAYS BEFORE  ISSUING A FINAL NEGATIVE REPORT Performed at [facility]    Report Status PENDING  Incomplete  Culture, blood (routine x 2)      Status: None (Preliminary result)   Collection Time: 09/23/14  5:11 PM  Result Value Ref Range Status   Specimen Description BLOOD RAC  Final   Special Requests BOTTLES DRAWN AEROBIC AND ANAEROBIC 5ML  Final   Culture   Final           BLOOD CULTURE RECEIVED NO GROWTH TO DATE CULTURE WILL BE HELD FOR 5 DAYS BEFORE ISSUING A FINAL NEGATIVE REPORT Performed at [facility]    Report Status PENDING  Incomplete  MRSA PCR  Screening     Status: Abnormal   Collection Time: 09/23/14 10:37 PM  Result Value Ref Range Status   MRSA by PCR POSITIVE (A) NEGATIVE Final    Comment:        The GeneXpert MRSA Assay (FDA approved for NASAL specimens only), is one component of a comprehensive MRSA colonization surveillance program. It is not intended to diagnose MRSA infection nor to guide or monitor treatment for MRSA infections. RESULT CALLED TO, READ BACK BY AND VERIFIED WITH: L.REINERT,RN AT 0147 ON 09/24/14 BY W.SHEA,MT   Clostridium Difficile by PCR     Status: None   Collection Time: 09/27/14  8:45 AM  Result Value Ref Range Status   C difficile by pcr NEGATIVE NEGATIVE Final     Studies: No results found.  Scheduled Meds: . ampicillin-sulbactam (UNASYN) IV  1.5 g Intravenous Q6H  . antiseptic oral rinse  7 mL Mouth Rinse q12n4p  . aspirin  300 mg Rectal Daily  . chlorhexidine  15 mL Mouth Rinse BID  . heparin  5,000 Units Subcutaneous 3 times per day  . levETIRAcetam  750 mg Intravenous Q12H  . LORazepam  1 mg Intravenous Once  . sodium chloride  3 mL Intravenous Q12H  . vitamin A & D       Continuous Infusions: . dextrose 5 % and 0.45% NaCl 75 mL/hr at 09/28/14 [place], Lalita Ebel  [facility] Pager [phone]. If 7PM-7AM, please contact night-coverage at [email or web address], password [facility] 09/28/2014, 9:09 AM  LOS: 5 days

## 2014-09-29 MED ORDER — LEVETIRACETAM 500 MG/5ML IV SOLN
750.0000 mg | Freq: Two times a day (BID) | INTRAVENOUS | Status: DC
  Administered 2014-09-29 – 2014-09-30 (×2): 750 mg via INTRAVENOUS
  Filled 2014-09-29 (×3): qty 7.5

## 2014-09-29 MED ORDER — LEVETIRACETAM IN NACL 750 MG/50ML IV SOLN: 750.0000 mg | Freq: Two times a day (BID) | INTRAVENOUS | Status: DC

## 2014-09-29 NOTE — Plan of Care (Signed)
Problem: Phase III Progression Outcomes Goal: Tolerating diet Outcome: Not Progressing Failed SLP

## 2014-09-29 NOTE — Progress Notes (Signed)
[facility] PROGRESS NOTE Assessment/Plan: Aspiration pneumonia/  Dysphagia/acute respiratory failure with hypoxia: - With Recurrent episode of aspiration, saturations have remained stable. - Cont to Unasyn. Mild episode of hypotension on 3.19.2016, Antihypertensive medications stopped. Will not  Resumed. - Appreciate palliative consult, Meeting with Health POA - Swallowing evaluation done recommended only ice chips, placed NPO, g-tube does will not eliminate changes of aspirations, pt is a poor candidate.  Protein-calorie malnutrition, severe  Paroxysmal a-fib: - Patient not appropriate for anticoagulation per H&P, rate controlled in sinus rhythm.  History of CVA (cerebrovascular accident): - cont ASA  Anemia of chronic disease: - At baseline most likely due to chronic kidney disease  Dementia: - Healthcare power of attorney is Ms. [name] phone [phone]   Seizure disorder: - Continue Keppra no changes were made. - no events  Essential hypertension, benign: - Resume norvasc.   Code Status: FULL DVT Prophylaxis: Hep Family Communication: none Disposition Plan: pending evaluation and improvement in medical condition   Consultants:  PMT  Procedures:  SLP  Antibiotics:  unasyn  HPI/Subjective: No complains.  Objective: Filed Vitals:   09/28/14 1405 09/28/14 1422 09/28/14 2009 09/29/14 0515  BP: 65/39 70/38 116/50 125/49  Pulse: 75  94 75  Temp: 98.3 F (36.8 C)  98.5 F (36.9 C) 97.8 F (36.6 C)  TempSrc: Oral  Oral Oral  Resp: 16  16 16   Height:      Weight:      SpO2: 95%  95% 98%    Intake/Output Summary (Last 24 hours) at 09/29/14 0727 Last data filed at 09/29/14 0600  Gross per 24 hour  Intake 1965.17 ml  Output   1750 ml  Net 215.17 ml   Filed Weights   09/23/14 2004  Weight: 57.607 kg (127 lb)   - Exam:  General: , in no acute distress.  HEENT: No bruits, no goiter.  Heart: Regular rate and rhythm. Lungs: Good air  movement, right side crackles. Abdomen: Soft, nontender, nondistended, positive bowel sounds.  Neuro: Grossly intact, nonfocal.   Data Reviewed: Basic Metabolic Panel:  Recent Labs Lab 09/23/14 1653 09/24/14 0450  NA 136 133*  K 3.8 4.2  CL 101 101  CO2 25 22  GLUCOSE 103* 96  BUN 12 11  CREATININE 0.85 0.79  CALCIUM 8.4 8.3*   Liver Function Tests: No results for input(s): AST, ALT, ALKPHOS, BILITOT, PROT, ALBUMIN in the last 168 hours. No results for input(s): LIPASE, AMYLASE in the last 168 hours. No results for input(s): AMMONIA in the last 168 hours. CBC:  Recent Labs Lab 09/23/14 1653 09/24/14 0450  WBC 4.9 4.4  NEUTROABS 3.0  --   HGB 10.9* 12.6*  HCT 35.4* 39.6  MCV 89.8 88.4  PLT 194 178   Cardiac Enzymes: No results for input(s): CKTOTAL, CKMB, CKMBINDEX, TROPONINI in the last 168 hours. BNP (last 3 results) No results for input(s): BNP in the last 8760 hours.  ProBNP (last 3 results) No results for input(s): PROBNP in the last 8760 hours.  CBG: No results for input(s): GLUCAP in the last 168 hours.  Recent Results (from the past 240 hour(s))  Culture, blood (routine x 2)      Status: None (Preliminary result)   Collection Time: 09/23/14  5:11 PM  Result Value Ref Range Status   Specimen Description BLOOD RIGHT FOREARM  Final   Special Requests BOTTLES DRAWN AEROBIC AND ANAEROBIC 5ML  Final   Culture   Final  BLOOD CULTURE RECEIVED NO GROWTH TO DATE CULTURE WILL BE HELD FOR 5 DAYS BEFORE ISSUING A FINAL NEGATIVE REPORT Performed at [facility]    Report Status PENDING  Incomplete  Culture, blood (routine x 2)      Status: None (Preliminary result)   Collection Time: 09/23/14  5:11 PM  Result Value Ref Range Status   Specimen Description BLOOD RAC  Final   Special Requests BOTTLES DRAWN AEROBIC AND ANAEROBIC 5ML  Final   Culture   Final           BLOOD CULTURE RECEIVED NO GROWTH TO DATE CULTURE WILL BE HELD FOR 5 DAYS BEFORE  ISSUING A FINAL NEGATIVE REPORT Performed at [facility]    Report Status PENDING  Incomplete  MRSA PCR Screening     Status: Abnormal   Collection Time: 09/23/14 10:37 PM  Result Value Ref Range Status   MRSA by PCR POSITIVE (A) NEGATIVE Final    Comment:        The GeneXpert MRSA Assay (FDA approved for NASAL specimens only), is one component of a comprehensive MRSA colonization surveillance program. It is not intended to diagnose MRSA infection nor to guide or monitor treatment for MRSA infections. RESULT CALLED TO, READ BACK BY AND VERIFIED WITH: L.REINERT,RN AT 0147 ON 09/24/14 BY W.SHEA,MT   Clostridium Difficile by PCR     Status: None   Collection Time: 09/27/14  8:45 AM  Result Value Ref Range Status   C difficile by pcr NEGATIVE NEGATIVE Final     Studies: No results found.  Scheduled Meds: . ampicillin-sulbactam (UNASYN) IV  1.5 g Intravenous Q6H  . antiseptic oral rinse  7 mL Mouth Rinse q12n4p  . aspirin  300 mg Rectal Daily  . chlorhexidine  15 mL Mouth Rinse BID  . heparin  5,000 Units Subcutaneous 3 times per day  . levETIRAcetam  750 mg Intravenous Q12H  . sodium chloride  3 mL Intravenous Q12H   Continuous Infusions: . dextrose 5 % and 0.45% NaCl 10 mL/hr at 09/28/14 1937     Charlynne Cousins  [facility] Pager [phone]. If 7PM-7AM, please contact night-coverage at [email or web address], password [facility] 09/29/2014, 7:27 AM  LOS: 6 days

## 2014-09-30 DIAGNOSIS — E43 Unspecified severe protein-calorie malnutrition: Secondary | ICD-10-CM

## 2014-09-30 LAB — CULTURE, BLOOD (ROUTINE X 2)
Culture: NO GROWTH
Culture: NO GROWTH

## 2014-09-30 MED ORDER — BISACODYL 10 MG RE SUPP: 10.0000 mg | RECTAL | Status: AC | PRN

## 2014-09-30 MED ORDER — ASPIRIN 300 MG RE SUPP: 300.0000 mg | Freq: Every day | RECTAL | Status: AC

## 2014-09-30 MED ORDER — ACETAMINOPHEN 120 MG RE SUPP: 120.0000 mg | Freq: Four times a day (QID) | RECTAL | Status: AC | PRN

## 2014-09-30 NOTE — Progress Notes (Signed)
Report given to [place] from [facility]

## 2014-09-30 NOTE — Clinical Social Work Note (Signed)
Patient for d/c today to SNF bed at  [facility]. Family and patient agreeable to this plan- plan transfer via EMS. Eduard Clos, MSW, Latanya Presser [phone]

## 2014-09-30 NOTE — Discharge Summary (Signed)
Physician Discharge Summary  Ronald Sutton UDJ:[id number] DOB: 01/14/1931 DOA: 09/23/2014  PCP: Ronald Brightly, MD  Admit date: 09/23/2014 Discharge date: 09/30/2014  Time spent:  35 minutes  Recommendations for Outpatient Follow-up:  1. Discharged to [facility] facility with hospice follow-up.  2. Prognosis is guarded. Please see recommendations below.   Discharge Diagnoses:   Principal Problem:   Aspiration pneumonia  Active Problems:    Acute respiratory failure with hypoxia   Dysphagia   Seizure disorder   Essential hypertension, benign   Dementia   Anemia of chronic disease   Paroxysmal a-fib   History of CVA (cerebrovascular accident)   Protein-calorie malnutrition, severe   Palliative care encounter   Discharge Condition: Poor prognosis  Diet recommendation: Comfort feeds  CODE STATUS: DO NOT RESUSCITATE  Filed Weights   09/23/14 2004  Weight: 57.607 kg (127 lb)    History of present illness:  Please refer to admission H&P for details, but in brief, 82 year old male with,  advanced dementia, hypertension, CVA with right-sided hemiparesis, , paroxysmal A. fib not on anticoagulation due to fall risk, malnutrition, seizure disorder, anemia of chronic disease who was admitted to [facility] from [facility] facility with aspiration pneumonia. On presentation he had hypoxic respiratory failure. Hospital course prolonged due to ongoing aspiration and need for goals of care discussion with family.  Hospital Course:  Acute hypoxic respiratory failure secondary to aspiration pneumonia Patient has severe dysphagia with recurrent episode of aspiration causing dropping O2 sat. Patient placed on IV Zosyn on admission and then switch to IV Unasyn.  Has failed swallow evaluation and made nothing by mouth. Antibiotic course completed. Cultures negative.   Recurrent aspiration pneumonia/failure to thrive and goals of care  discussion. Patient does not have capacity to make decision and after discussion by palliative care consult with family members and healthcare proxy he was made DO NOT RESUSCITATE. Family did not wish for artificial feed or partial any aggressive care. They recommended him to return to nursing home with addition to hospice care and felt it was appropriate for hospice to manage him at the facility as he declines. They also did not feel that patient should be transferred back to hospital or use of IV antibiotic to treat future infection. (They were okay with oral antibiotic but did not want to escalate care to the point that he would need hospitalization).  It is recommended that if patient's recent declines at the nursing home hospice should be called to transfer patient to a hospice facility. -Patient placed on comfort feeds.  Advanced vascular dementia Patient does not have capacity to make decisions. Continue aspirin suppository  Paroxysmal A. fib Note a candidate for anticoagulation.  Severe protein calorie malnutrition Comfort feeding as tolerated  Seizure disorder Continue oral Keppra  Hypertension Antihypertensives held low blood pressure while in the hospital.   She will be discharged to skilled nursing facility with hospice care  Procedures:  None  Consultations:  Active care  Discharge Exam: Filed Vitals:   09/30/14 0526  BP: 176/78  Pulse: 82  Temp: 97.9 F (36.6 C)  Resp: 16    General: Elderly thin built male lying in bed in no acute distress, confused HEENT: Pallor present, dry oral mucosa, supple neck Chest: rt basilar crackles, no rhonchi or wheeze CVS: Normal S1 and S2, no murmurs rub or gallop GI: Soft, nondistended, nontender, bowel sounds present Musculoskeletal: Warm, no edema CNS: Awake, oriented x0 ( knows his name only)  Discharge Instructions    Current Discharge Medication List    START taking these medications   Details   acetaminophen (TYLENOL) 120 MG suppository Place 1 suppository (120 mg total) rectally every 6 (six) hours as needed for fever. Qty: 12 suppository, Refills: 0    aspirin 300 MG suppository Place 1 suppository (300 mg total) rectally daily. Qty: 30 suppository, Refills: 0    bisacodyl (DULCOLAX) 10 MG suppository Place 1 suppository (10 mg total) rectally as needed for moderate constipation. Qty: 12 suppository, Refills: 0      CONTINUE these medications which have NOT CHANGED   Details  fluticasone (FLONASE) 50 MCG/ACT nasal spray Place 1 spray into both nostrils daily. For allergies    levETIRAcetam (KEPPRA) 500 MG tablet Take 500 mg by mouth 2 (two) times daily.      STOP taking these medications     Alum & Mag Hydroxide-Simeth (MAGIC MOUTHWASH) SOLN      amLODipine-benazepril (LOTREL) 10-20 MG per capsule      aspirin 325 MG EC tablet      cetirizine (ZYRTEC) 10 MG tablet      levofloxacin (LEVAQUIN) 750 MG/150ML SOLN      Multiple Vitamin (MULTI VITAMIN MENS) tablet      sennosides-docusate sodium (SENOKOT-S) 8.6-50 MG tablet      acetaminophen (TYLENOL) 325 MG tablet      nitroGLYCERIN (NITROSTAT) 0.4 MG SL tablet        No Known Allergies Follow-up Information    Please follow up.   Why:  follow up at [facility] with hospice       The results of significant diagnostics from this hospitalization (including imaging, microbiology, ancillary and laboratory) are listed below for reference.    Significant Diagnostic Studies: Dg Chest Port 1 View  09/23/2014   CLINICAL DATA:  Nursing home patient with current diagnosis of aspiration pneumonia. Patient non communicative. Hypoxemia with oxygen saturation 80% on room air.  EXAM: PORTABLE CHEST - 1 VIEW  COMPARISON:  08/05/2013 and earlier.  FINDINGS: Interval development of patchy airspace opacities in the lower lobes and likely right middle lobe since the most recent prior examination 08/05/2013. Cardiac silhouette  normal in size, unchanged. Thoracic aorta tortuous and atherosclerotic, unchanged.  IMPRESSION: Patchy pneumonia involving the lung bases, likely the lower lobes and right middle lobe, query aspiration.   Electronically Signed   By: Evangeline Dakin M.D.   On: 09/23/2014 17:00   Dg Swallowing Func-speech Pathology  09/25/2014   Narda Rutherford, CCC-SLP     09/25/2014  1:15 PM  Objective Swallowing Evaluation: Modified Barium Swallowing Study   Patient Details  Name: Ronald Sutton MRN: [id number] Date of Birth: 1930-10-24  Today's Date: 09/25/2014 Time: SLP Start Time (ACUTE ONLY): 1210-SLP Stop Time (ACUTE  ONLY): 1245 SLP Time Calculation (min) (ACUTE ONLY): 35 min  Past Medical History:  Past Medical History  Diagnosis Date  . Hypertension   . Diabetes mellitus without complication   . CVA (cerebral infarction)     Right-sided hemiparesis  . Seizures   . A-fib   . Anemia   . Arthritis     osteoarthritis  . Cancer     Oral cancer?  . Dementia    Past Surgical History:  Past Surgical History  Procedure Laterality Date  . Cholecystectomy    . 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  Swallow evaluation ordered.  Pt wth  admit for fall in 07/2013.    No Data Recorded  Assessment / Plan / Recommendation CHL IP CLINICAL IMPRESSIONS 09/25/2014  Dysphagia Diagnosis Moderate oral phase dysphagia;Severe oral  phase dysphagia;Moderate pharyngeal phase dysphagia;Severe  pharyngeal phase dysphagia;Moderate cervical esophageal phase  dysphagia;Severe cervical esophageal phase dysphagia  Clinical impression  Moderately severe oropharyngeal and cervical  esophageal dysphagia with sensorimotor deficits due to pt's h/o  pharyngeal cancer, CVA, and cerebellar atrophy.  Lingual weakness  resulted in delayed oral transiting, premature spillage into  pharynx uncontrolled and residuals.  Pharyngeal swallow  characterized by delay, decreased tongue base retraction and poor  proximal esophageal opening resulting in residuals and  aspiration.  Pt with oropharyngeal residuals across consistencies  without adequate ability to clear.    He likely has been experiencing chronic aspiration (dating back  to at least 2005) with decreased tolerance due to advancing age,  deconditioning etc.  He will aspirate regardless of consistency -  and if pt goals become comfort (note PMT referral) = would defer  to him for dietary desires.    Recommend NPO except small single ice boluses with adequate oral  care until determination regarding goals is made.  Educated pt  that feeding tube will not prevent aspiration.  Using live video,  SLP educated pt to findings.        CHL IP TREATMENT RECOMMENDATION 09/25/2014  Treatment Plan Recommendations Therapy as outlined in treatment  plan below     CHL IP DIET RECOMMENDATION 09/25/2014  Diet Recommendations NPO  Liquid Administration via (None)  Medication Administration (None)  Compensations (None)  Postural Changes and/or Swallow Maneuvers (None)     CHL IP OTHER RECOMMENDATIONS 09/25/2014  Recommended Consults (None)  Oral Care Recommendations Oral care Q4 per protocol;Oral care  prior to ice chips  Other Recommendations Have oral suction available     CHL IP FOLLOW UP RECOMMENDATIONS 09/25/2014  Follow up Recommendations (No Data)     CHL IP FREQUENCY AND DURATION 09/25/2014 Speech Therapy Frequency  (ACUTE ONLY) min 1 x/week  Treatment Duration 1 week         CHL IP REASON FOR REFERRAL 09/25/2014  Reason for Referral Objectively evaluate swallowing function     CHL IP ORAL PHASE 09/25/2014  Oral Phase  Impaired  Oral - Honey Teaspoon Piecemeal swallowing;Reduced posterior  propulsion;Weak lingual manipulation;Delayed oral  transit;Lingual/palatal residue  Oral - Honey Cup Reduced posterior propulsion;Piecemeal  swallowing;Delayed oral transit;Weak lingual  manipulation;Lingual/palatal residue  Oral - Honey Syringe (None)  Oral - Nectar Teaspoon Reduced posterior propulsion;Piecemeal  swallowing;Delayed oral transit;Weak lingual  manipulation;Lingual/palatal residue  Oral - Nectar Cup Reduced posterior propulsion;Piecemeal  swallowing;Delayed oral transit;Weak lingual  manipulation;Lingual/palatal residue;Left anterior bolus loss  Oral - Thin Teaspoon Reduced posterior propulsion;Piecemeal  swallowing;Delayed oral transit;Weak lingual  manipulation;Lingual/palatal residue;Left anterior bolus loss  Oral - Thin Cup Reduced posterior propulsion;Piecemeal  swallowing;Delayed oral transit;Weak lingual  manipulation;Lingual/palatal residue;Left anterior bolus loss  Oral - Thin Straw Reduced posterior propulsion;Piecemeal  swallowing;Delayed oral transit;Weak lingual  manipulation;Lingual/palatal residue  Oral - Thin Syringe (None)  Oral - Puree Reduced posterior propulsion;Piecemeal  swallowing;Delayed oral transit;Weak lingual  manipulation;Lingual/palatal  residue  Oral - Mechanical Soft Reduced posterior propulsion;Piecemeal  swallowing;Delayed oral transit;Lingual/palatal residue  Oral - Regular (None)  Oral - Multi-consistency (None)  Oral - Pill (None)  Oral Phase - Comment premature spillage of barium into pharynx  uncontrolled      CHL IP PHARYNGEAL PHASE 09/25/2014  Pharyngeal Phase Impaired  Pharyngeal - Honey Teaspoon Premature spillage to  valleculae;Premature spillage to pyriform sinuses;Reduced  pharyngeal peristalsis;Pharyngeal residue - valleculae;Pharyngeal  residue - pyriform sinuses;Reduced tongue base retraction;Delayed  swallow initiation  Pharyngeal - Honey Cup Premature spillage to pyriform   sinuses;Premature spillage to valleculae;Reduced pharyngeal  peristalsis;Pharyngeal residue - valleculae;Pharyngeal residue -  pyriform sinuses;Reduced tongue base retraction;Delayed swallow  initiation  Pharyngeal - Nectar Teaspoon Premature spillage to  valleculae;Premature spillage to pyriform sinuses;Reduced  pharyngeal peristalsis;Pharyngeal residue - valleculae;Pharyngeal  residue - pyriform sinuses;Reduced tongue base retraction;Delayed  swallow initiation;Penetration/Aspiration during  swallow;Penetration/Aspiration after swallow  Penetration/Aspiration details (nectar teaspoon) Material enters  airway, CONTACTS cords and not ejected out  Pharyngeal - Nectar Cup Premature spillage to pyriform  sinuses;Premature spillage to valleculae;Reduced pharyngeal  peristalsis;Pharyngeal residue - valleculae;Pharyngeal residue -  pyriform sinuses;Pharyngeal residue - posterior pharnyx;Reduced  tongue base retraction;Delayed swallow initiation;Trace  aspiration;Penetration/Aspiration before swallow  Penetration/Aspiration details (nectar cup) Material enters  airway, passes BELOW cords without attempt by patient to eject  out (silent aspiration)  Pharyngeal - Thin Cup Premature spillage to valleculae;Premature  spillage to pyriform sinuses;Reduced pharyngeal  peristalsis;Reduced tongue base retraction;Delayed swallow  initiation;Moderate aspiration;Penetration/Aspiration before  swallow;Pharyngeal residue - valleculae;Pharyngeal residue -  pyriform sinuses  Penetration/Aspiration details (thin cup) Material enters airway,  passes BELOW cords and not ejected out despite cough attempt by  patient  Pharyngeal - Thin Straw Premature spillage to pyriform  sinuses;Premature spillage to valleculae;Reduced pharyngeal  peristalsis;Reduced tongue base retraction;Delayed swallow  initiation;Penetration/Aspiration during  swallow;Penetration/Aspiration before swallow;Pharyngeal residue  - valleculae;Pharyngeal residue - pyriform  sinuses  Penetration/Aspiration details (thin straw) Material enters  airway, remains ABOVE vocal cords and not ejected out  Pharyngeal - Puree Reduced tongue base retraction;Delayed swallow  initiation  Pharyngeal - Mechanical Soft Pharyngeal residue -  valleculae;Delayed swallow initiation;Reduced pharyngeal  peristalsis;Reduced tongue base retraction;Pharyngeal residue -  pyriform sinuses  Pharyngeal Comment head turn left not helpful (pt's weak side) to  prevent residuals, cued dry swallows minimally effective to  decrease residuals, pt unable to expectorate to clear pharyngeal  residuals  - due to weakness     CHL IP CERVICAL ESOPHAGEAL PHASE 09/25/2014  Cervical Esophageal Phase Impaired  Honey Teaspoon   Honey Cup   Nectar Teaspoon   Nectar Cup   Nectar Straw   Thin Teaspoon  Thin Cup   Thin Straw   Cervical Esophageal Comment appearance of decreased upper  esophageal opening adding to pharyngeal residuals    No flowsheet data found.         Luanna Salk, [facility] SLP [phone]     Microbiology: Recent Results (from the past 240 hour(s))  Culture, blood (routine x 2)      Status: None   Collection Time: 09/23/14  5:11 PM  Result Value Ref Range Status   Specimen Description BLOOD RIGHT FOREARM  Final   Special Requests BOTTLES DRAWN AEROBIC AND ANAEROBIC 5ML  Final   Culture   Final    NO GROWTH 5 DAYS Performed at [facility]    Report Status 09/30/2014 FINAL  Final  Culture, blood (routine x 2)      Status: None   Collection Time:  09/23/14  5:11 PM  Result Value Ref Range Status   Specimen Description BLOOD RAC  Final   Special Requests BOTTLES DRAWN AEROBIC AND ANAEROBIC 5ML  Final   Culture   Final    NO GROWTH 5 DAYS Performed at [facility]    Report Status 09/30/2014 FINAL  Final  MRSA PCR Screening     Status: Abnormal   Collection Time: 09/23/14 10:37 PM  Result Value Ref Range Status   MRSA by PCR POSITIVE (A) NEGATIVE Final    Comment:        The  GeneXpert MRSA Assay (FDA approved for NASAL specimens only), is one component of a comprehensive MRSA colonization surveillance program. It is not intended to diagnose MRSA infection nor to guide or monitor treatment for MRSA infections. RESULT CALLED TO, READ BACK BY AND VERIFIED WITH: L.REINERT,RN AT 0147 ON 09/24/14 BY W.SHEA,MT   Clostridium Difficile by PCR     Status: None   Collection Time: 09/27/14  8:45 AM  Result Value Ref Range Status   C difficile by pcr NEGATIVE NEGATIVE Final     Labs: Basic Metabolic Panel:  Recent Labs Lab 09/23/14 1653 09/24/14 0450  NA 136 133*  K 3.8 4.2  CL 101 101  CO2 25 22  GLUCOSE 103* 96  BUN 12 11  CREATININE 0.85 0.79  CALCIUM 8.4 8.3*   Liver Function Tests: No results for input(s): AST, ALT, ALKPHOS, BILITOT, PROT, ALBUMIN in the last 168 hours. No results for input(s): LIPASE, AMYLASE in the last 168 hours. No results for input(s): AMMONIA in the last 168 hours. CBC:  Recent Labs Lab 09/23/14 1653 09/24/14 0450  WBC 4.9 4.4  NEUTROABS 3.0  --   HGB 10.9* 12.6*  HCT 35.4* 39.6  MCV 89.8 88.4  PLT 194 178   Cardiac Enzymes: No results for input(s): CKTOTAL, CKMB, CKMBINDEX, TROPONINI in the last 168 hours. BNP: BNP (last 3 results) No results for input(s): BNP in the last 8760 hours.  ProBNP (last 3 results) No results for input(s): PROBNP in the last 8760 hours.  CBG: No results for input(s): GLUCAP in the last 168 hours.     SignedLouellen Molder  [facility] 09/30/2014, 1:13 PM

## 2015-02-22 IMAGING — CR DG HIP COMPLETE 2+V*R*
3 series · 3 of 3 positions shown · non-contrast
Comparison: Right hip radiographs performed 03/27/2008

CLINICAL DATA: Unwitnessed fall, with right hip pain.

EXAM:
RIGHT HIP - COMPLETE 2+ VIEW

[x pelvis]
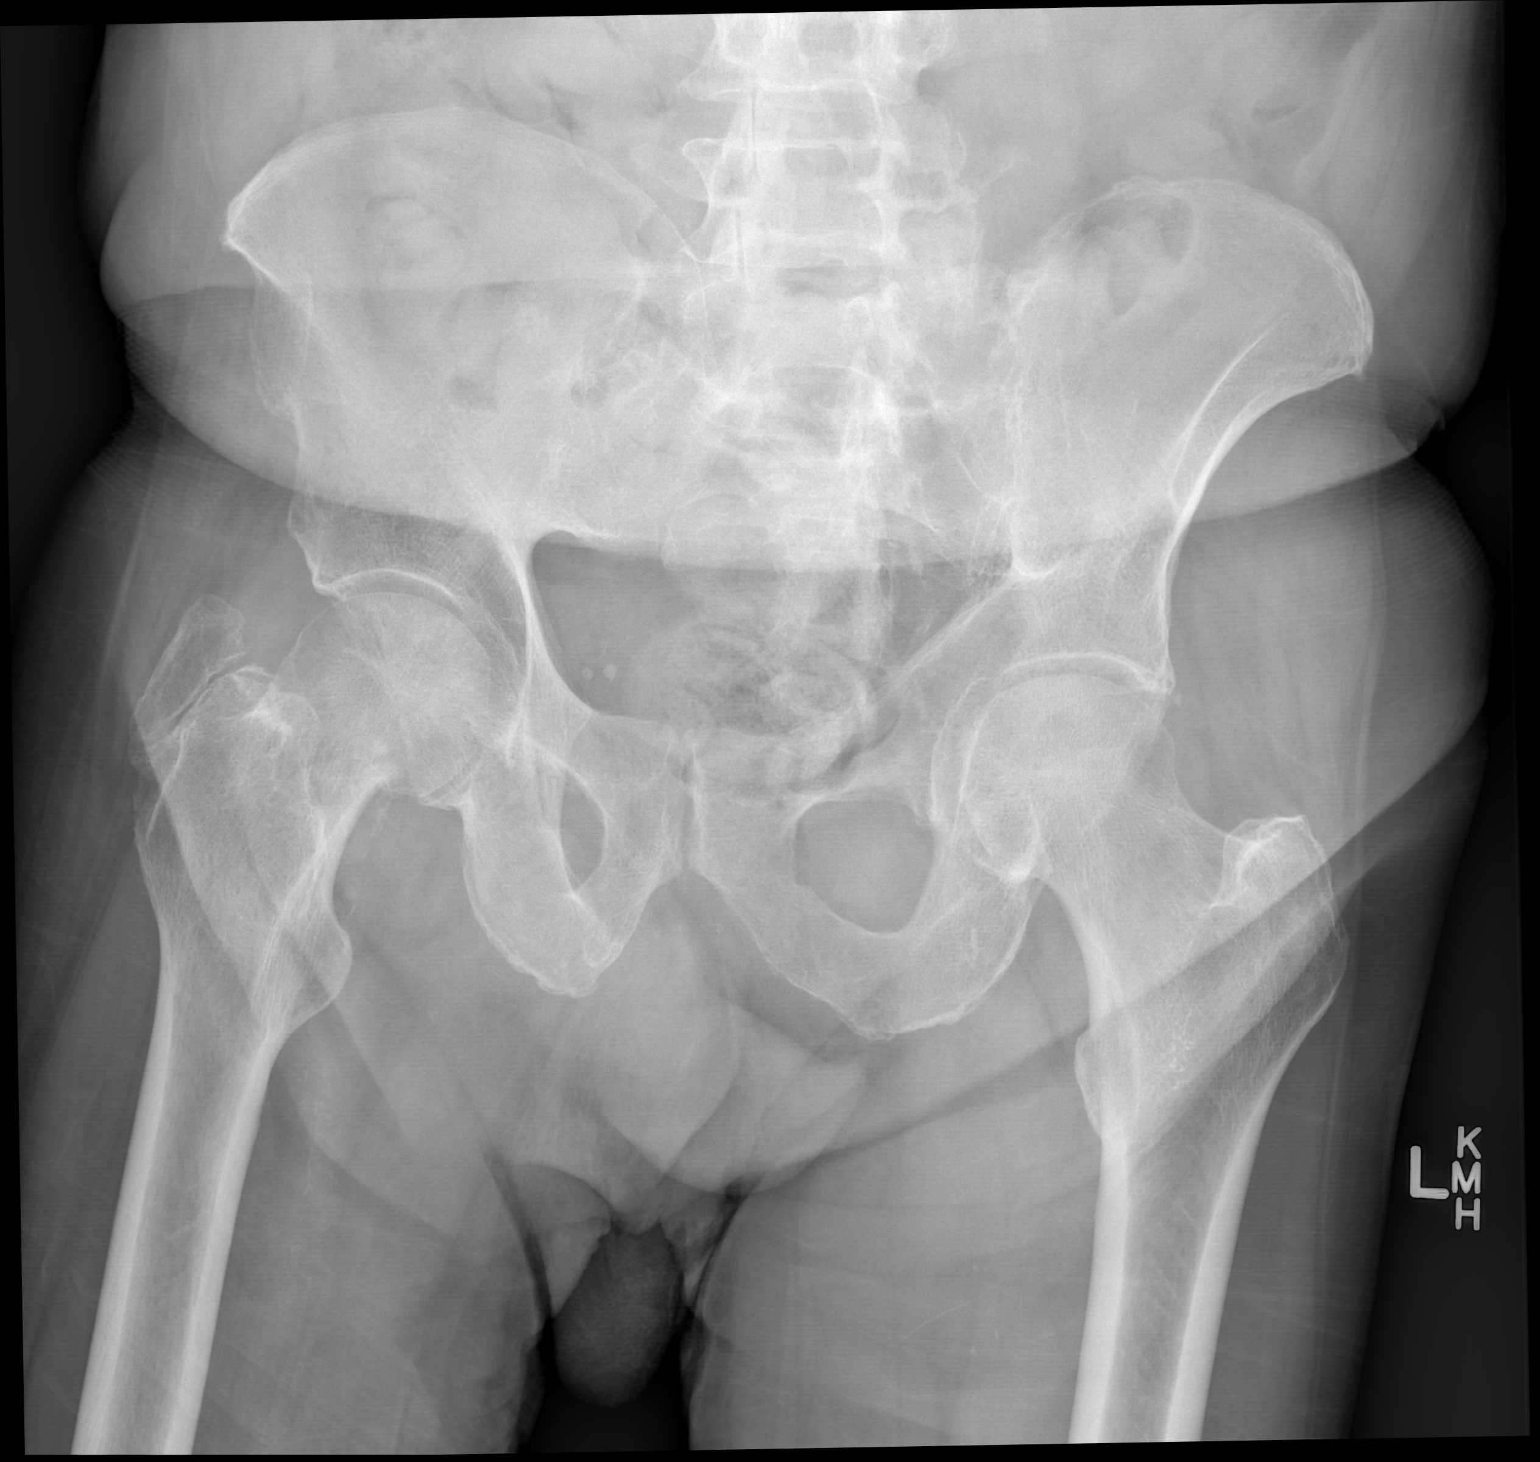

[x hip ap right (1 of 2)]
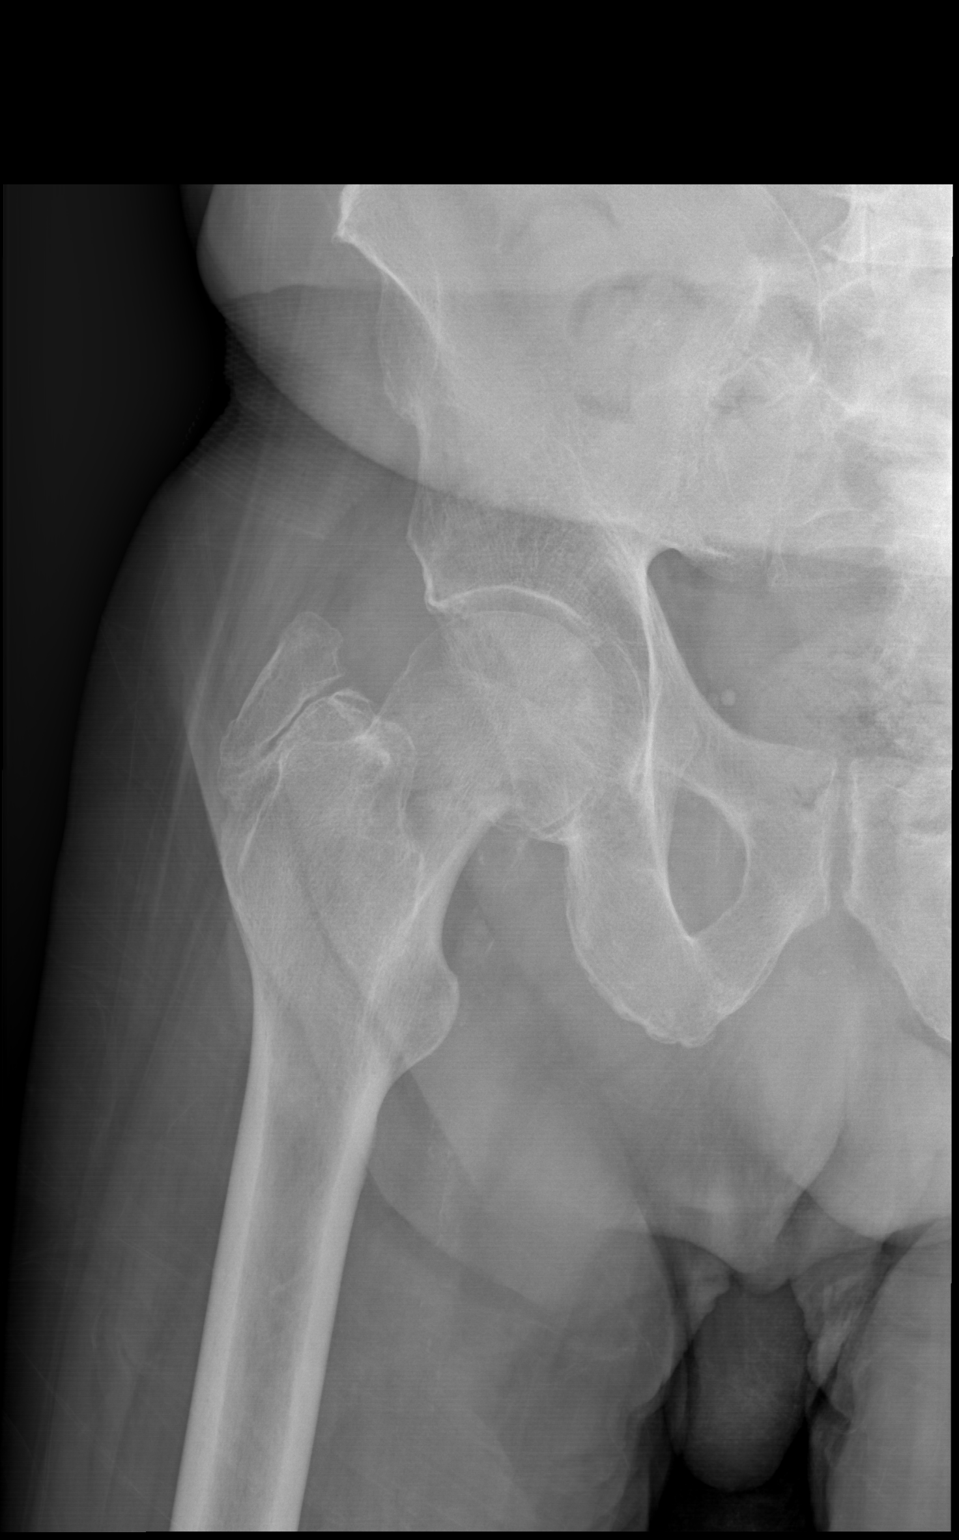

[x hip ap right (2 of 2)]
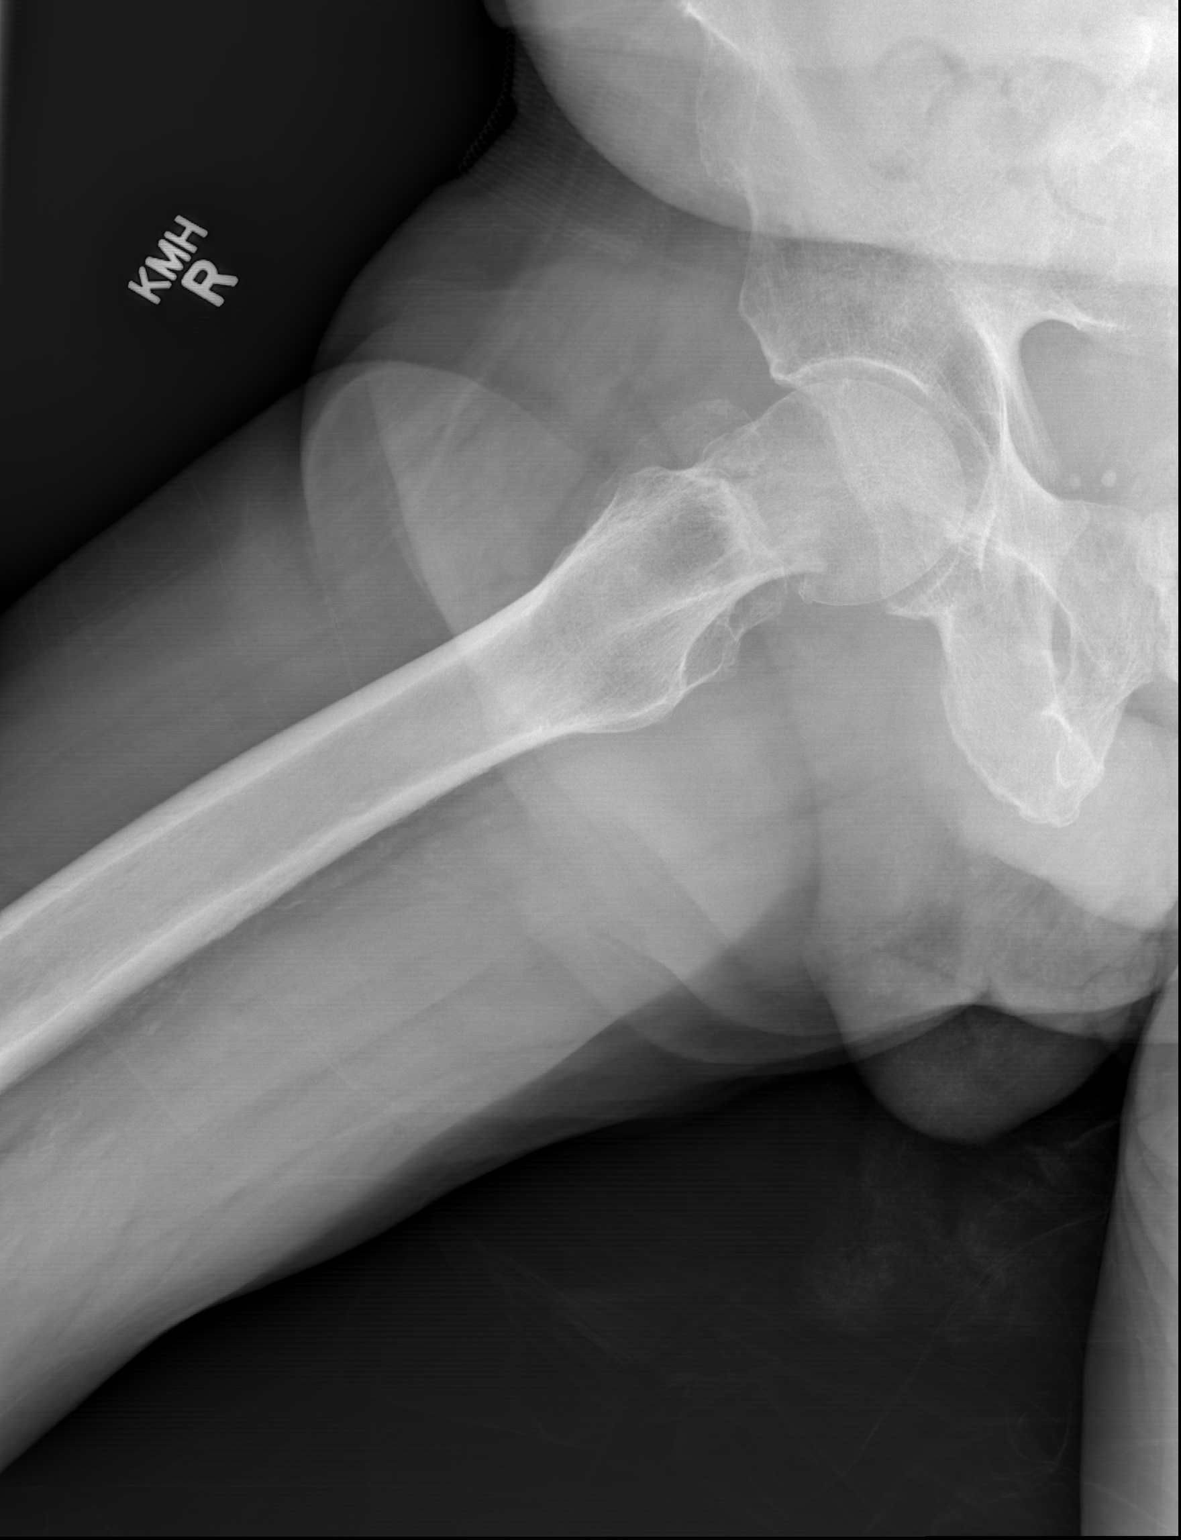

[3 of 3 positions shown; findings below may reference images not displayed]

FINDINGS: There is a minimally displaced subcapital fracture through the right
femoral neck, though it does extend more distally along the lateral
aspect of the neck. The right femoral head remains seated at the
acetabulum. A chronic osseous fragment is again seen overlying the
greater femoral trochanter, likely reflecting remote injury.

No additional fractures are seen. The left hip joint is grossly
unremarkable in appearance. The sacroiliac joints are within normal
limits. The visualized bowel gas pattern is grossly unremarkable.
IMPRESSION: Minimally displaced subcapital fracture through the right femoral
neck; it does extend more distally along the lateral aspect of the
neck.

## 2015-02-23 IMAGING — CR DG PORTABLE PELVIS
1 series · 1 of 1 positions shown · non-contrast
Comparison: 08/05/2013

CLINICAL DATA: Right hip subcapital femoral neck fracture,
operative fixation

EXAM:
PORTABLE PELVIS 1-2 VIEWS

[AP]
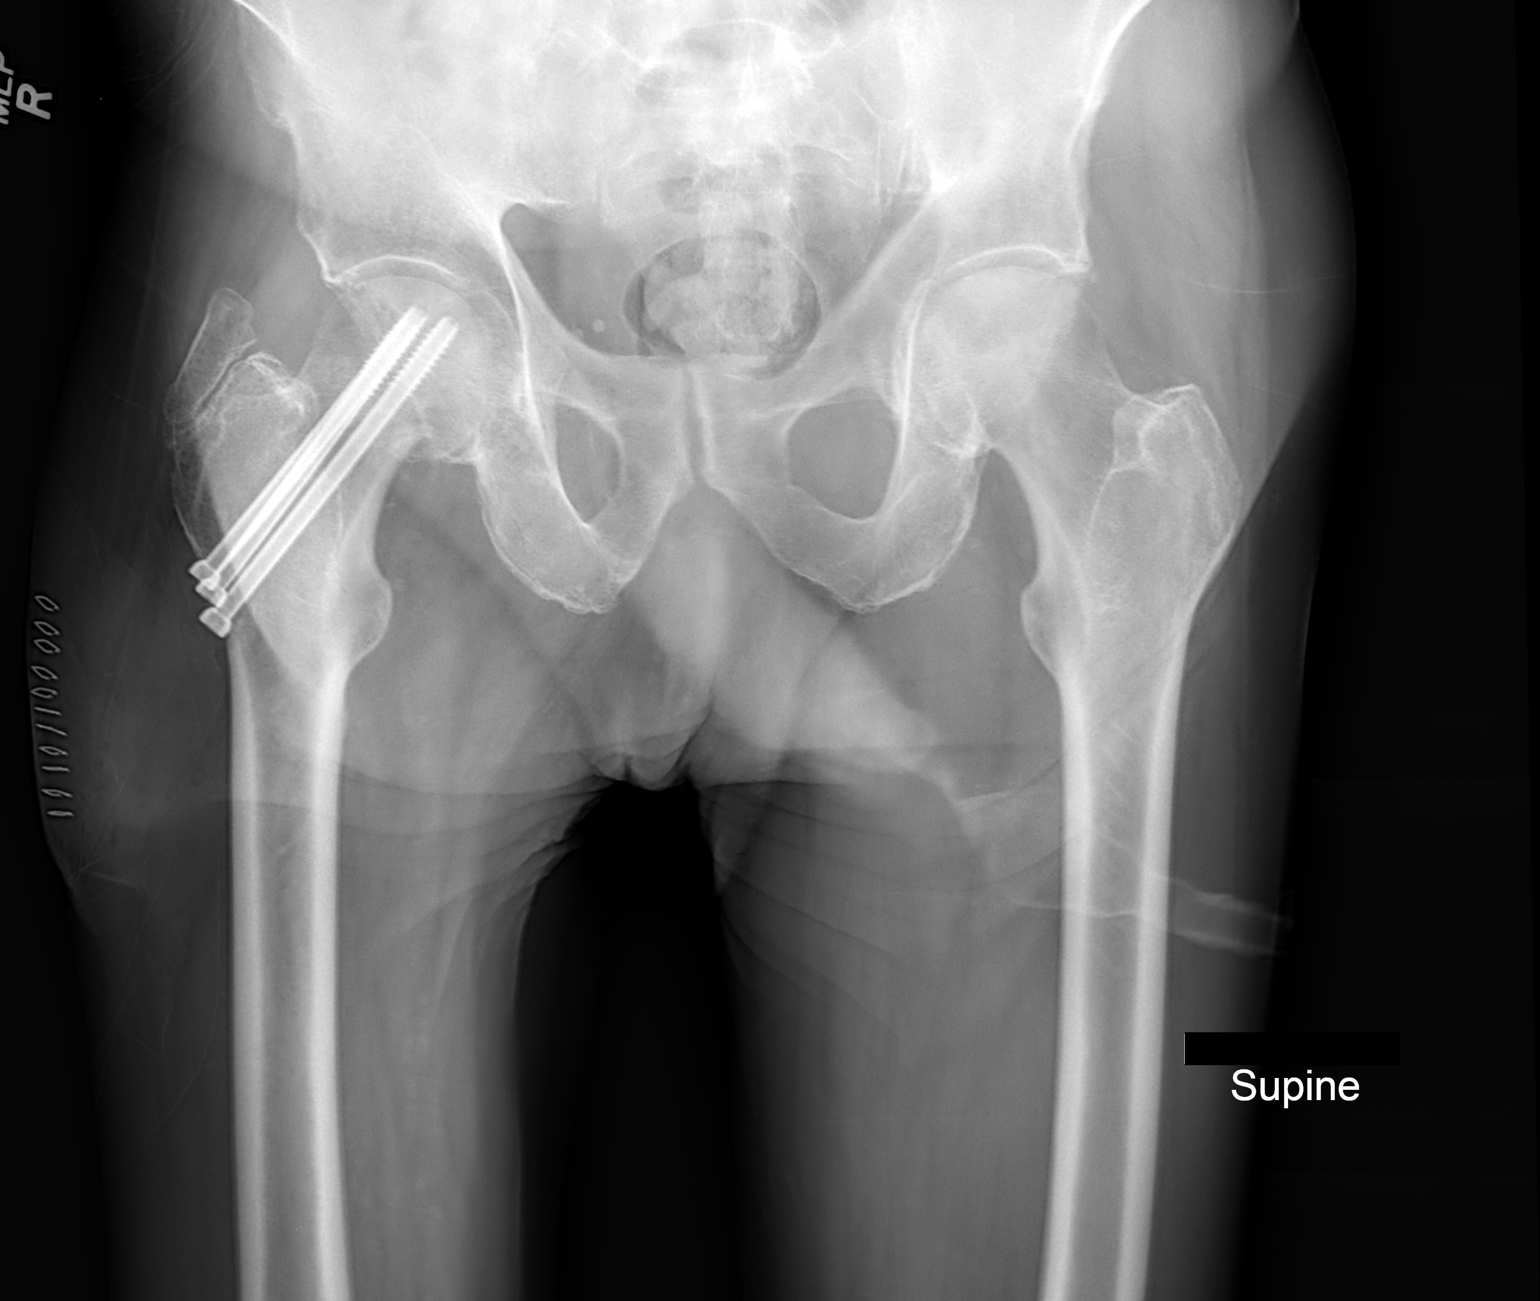

[1 of 1 positions shown; findings below may reference images not displayed]

FINDINGS: 3 fixation screws reduced the right proximal femur subcapital
femoral neck fracture. Anatomic alignment. Stable appearance. No
malalignment. No hardware abnormality. Skin staples laterally.
IMPRESSION: Screw fixation of the right proximal femur subcapital femoral neck
fracture. No complicating features

## 2015-07-13 DEATH — deceased

## 2016-04-11 IMAGING — CR DG CHEST 1V PORT
1 series · 1 of 1 positions shown · non-contrast
Comparison: 08/05/2013 and earlier.

CLINICAL DATA: [HOSPITAL] patient with current diagnosis of
aspiration pneumonia. Patient non communicative. Hypoxemia with
oxygen saturation 80% on room air.

EXAM:
PORTABLE CHEST - 1 VIEW

[AP]
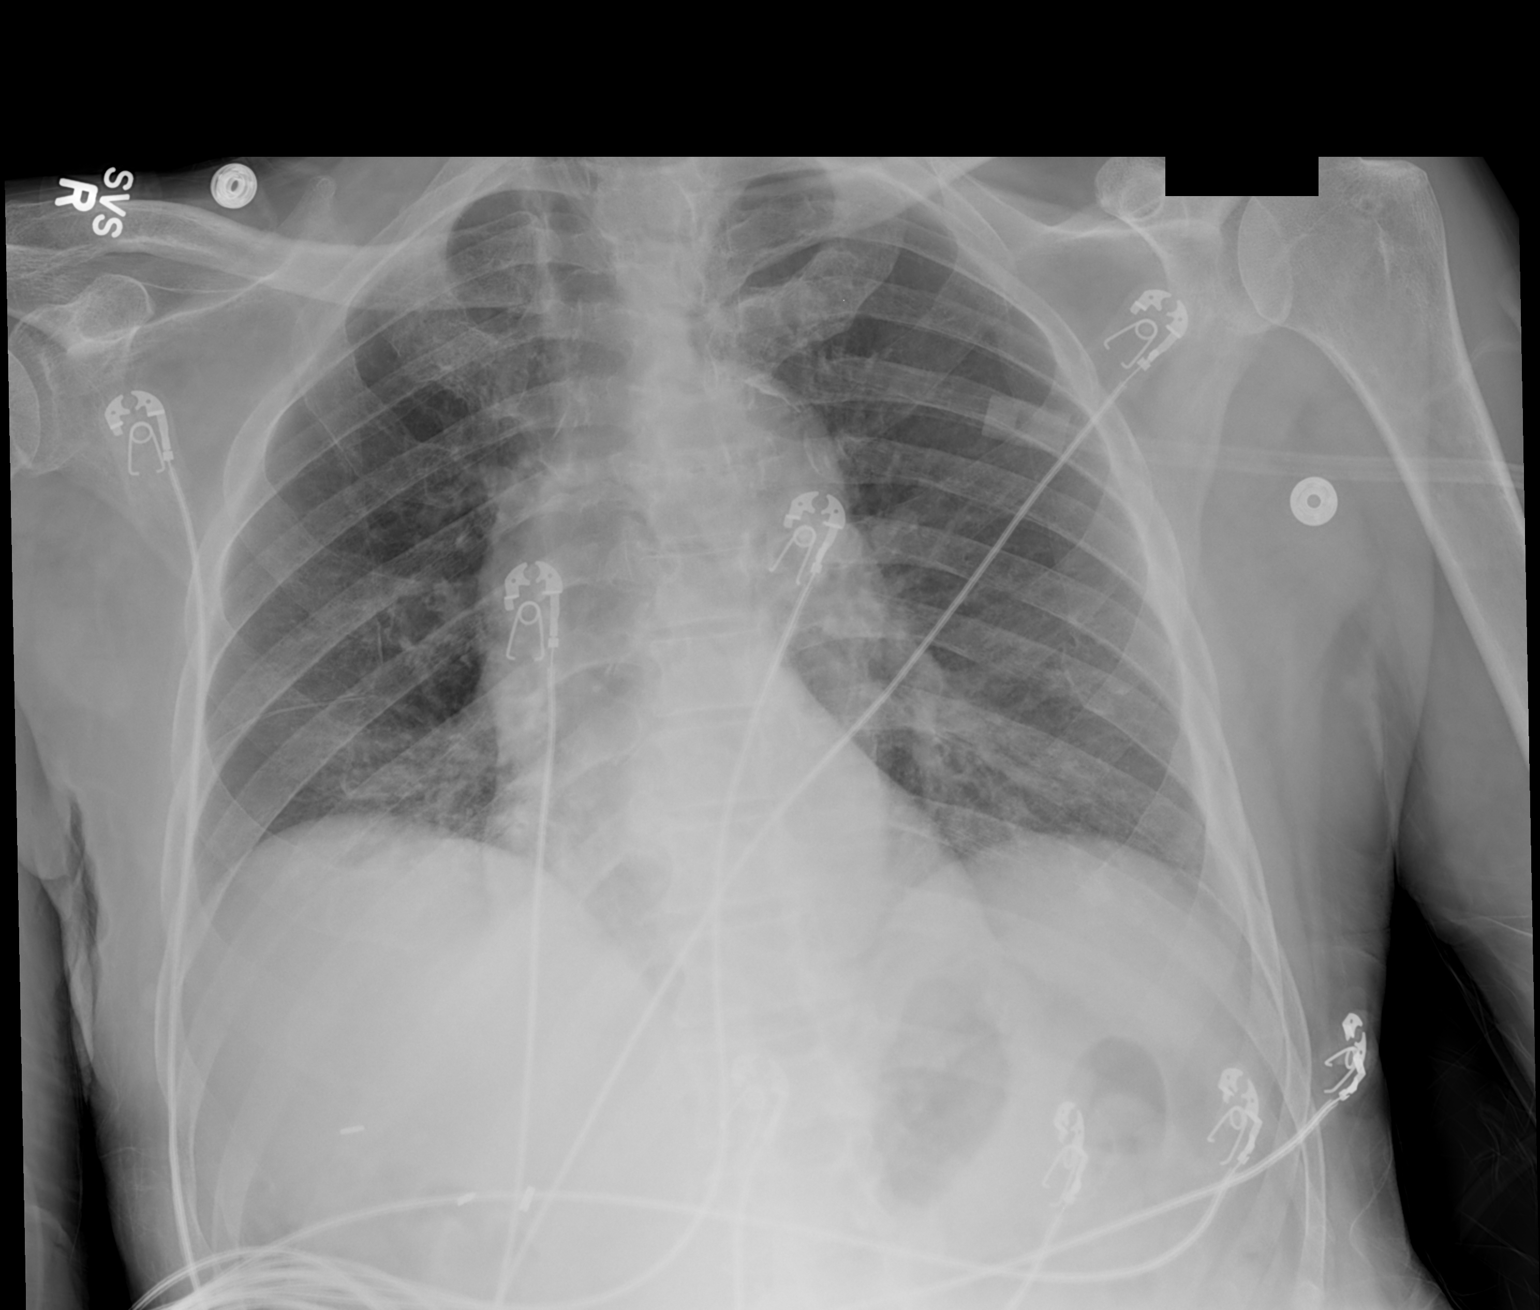

[1 of 1 positions shown; findings below may reference images not displayed]

FINDINGS: Interval development of patchy airspace opacities in the lower lobes
and likely right middle lobe since the most recent prior examination
08/05/2013. Cardiac silhouette normal in size, unchanged. Thoracic
aorta tortuous and atherosclerotic, unchanged.
IMPRESSION: Patchy pneumonia involving the lung bases, likely the lower lobes
and right middle lobe, query aspiration.

## 2016-11-25 ENCOUNTER — Encounter (HOSPITAL_COMMUNITY): Payer: Self-pay

## 2016-11-25 ENCOUNTER — Emergency Department (HOSPITAL_COMMUNITY)
Admission: EM | Admit: 2016-11-25 | Discharge: 2016-11-26 | Disposition: A | Discharge status: Home / Self Care | Payer: Medicare Other | Attending: Emergency Medicine | Admitting: Emergency Medicine

## 2016-11-25 DIAGNOSIS — R404 Transient alteration of awareness: Secondary | ICD-10-CM

## 2016-11-25 DIAGNOSIS — Z7982 Long term (current) use of aspirin: Secondary | ICD-10-CM | POA: Insufficient documentation

## 2016-11-25 DIAGNOSIS — Z79899 Other long term (current) drug therapy: Secondary | ICD-10-CM | POA: Insufficient documentation

## 2016-11-25 DIAGNOSIS — I10 Essential (primary) hypertension: Secondary | ICD-10-CM | POA: Insufficient documentation

## 2016-11-25 DIAGNOSIS — F17290 Nicotine dependence, other tobacco product, uncomplicated: Secondary | ICD-10-CM | POA: Insufficient documentation

## 2016-11-25 DIAGNOSIS — R4182 Altered mental status, unspecified: Secondary | ICD-10-CM | POA: Diagnosis not present

## 2016-11-25 LAB — CBG MONITORING, ED: Glucose-Capillary: 85 mg/dL (ref 65–99)

## 2016-11-25 NOTE — ED Provider Notes (Signed)
I saw and evaluated the patient, reviewed the resident's note and I agree with the findings and plan.  Pertinent History: The pt has hx of seizures - on Keppra - states that he feels fine but was evidently found by family to have some stiffness and confusion when they came home after having left 4 hours prior when he was at his normal level of consciousness / baseline.  Pt does not recall seizure, has no CP, HA, blurred vision or other c/o at this time. The pt denies missing meds, has no tongue pain but was incontinent of urine.  ? CBG prehospital  Pertinent Exam findings: well appaering, clear heart and lung sounds, soft abdomen, neuro without focal findings.  Normal FNF, clear speech, normal grips and no drift - has normal strength in legs, normal memory other than the events of this morning.  I personally interpreted the EKG as well as the resident and agree with the interpretation on the resident's chart.  Final diagnoses:  Altered consciousness      Eber HongMiller, [place], MD 12/01/16 [phone]

## 2016-11-25 NOTE — ED Triage Notes (Signed)
Pt arrives via EMS from home with AMS. Pt daughter reports last time she saw him at 10:30 this morning he was A&Ox4 but when returning home at 2:15 he was altered. Daughter states he was "postering" but still talking. Pt is now a&o to person and place. Pt was incontinent of urine. Pt has hx of seizures but no seizure activity was witnessed.   vss 132/76 Hr-80 spo2- 98% ra rr-20 cbg- 109  18LFA

## 2016-11-25 NOTE — ED Notes (Signed)
EMS reports pts last seizure was 5 years ago

## 2016-11-25 NOTE — ED Notes (Signed)
Pt and son state they understands instructions. Home stable with son.

## 2016-11-25 NOTE — Discharge Instructions (Signed)
call or return to have any questions, new symptoms, change in symptoms, or symptoms that you  do not understand. ° °

## 2016-11-25 NOTE — ED Provider Notes (Signed)
[facility] Provider Note   CSN: [id number] Arrival date & time: 11/25/16  1446     History   Chief Complaint No chief complaint on file.   HPI Kayceon Oki is a 83 y.o. male.  The history is provided by the patient and a relative.  Altered Mental Status   This is a new problem. The current episode started 1 to 2 hours ago. The problem has been resolved. Pertinent negatives include no weakness. Associated symptoms comments: Daughter told ems "rigid, altered, and incontinent". Risk factors: hx of szs. His past medical history is significant for seizures.    Past Medical History:  Diagnosis Date  . Hypertension   . Pancreatitis   . Seizures [facility])     Patient Active Problem List   Diagnosis Date Noted  . Syncope 01/13/2012  . Anemia 12/09/2011  . Seizures (HCC) 12/08/2011  . HTN (hypertension) 12/08/2011  . Encephalopathy 12/08/2011    Past Surgical History:  Procedure Laterality Date  . HERNIA REPAIR         Home Medications    Prior to Admission medications   Medication Sig Start Date End Date Taking? Authorizing Provider  aspirin EC 81 MG tablet Take 81 mg by mouth every morning.   Yes [provider]  levETIRAcetam (KEPPRA) 500 MG tablet Take 1 tablet (500 mg total) by mouth 2 (two) times daily. 05/04/13  Yes Buriev, Isaiah Serge, MD  metoprolol tartrate (LOPRESSOR) 50 MG tablet Take 50 mg by mouth daily. 10/11/16  Yes [provider]    Family History Family History  Problem Relation Age of Onset  . Hypertension Other   . Diabetes Other   . Asthma Other     Social History Social History  Substance Use Topics  . Smoking status: Current Some Day Smoker    Types: Cigars  . Smokeless tobacco: Never Used  . Alcohol use No     Allergies   Patient has no known allergies.   Review of Systems Review of Systems  Constitutional: Negative for fever.  HENT: Negative.   Respiratory: Negative for cough and shortness of breath.     Cardiovascular: Negative for chest pain and leg swelling.  Gastrointestinal: Negative.   Genitourinary: Negative.   Musculoskeletal: Negative.   Skin: Negative.   Neurological: Negative for dizziness, weakness, light-headedness and headaches.  All other systems reviewed and are negative.    Physical Exam Updated Vital Signs BP 140/62 (BP Location: Right Arm)   Pulse 64   Temp 97.6 F (36.4 C) (Oral)   Resp 16   Wt 119 lb (54 kg)   SpO2 99%   BMI 23.24 kg/m  ED Triage Vitals  Enc Vitals Group     BP --      Pulse --      Resp --      Temp --      Temp src --      SpO2 11/25/16 1451 98 %     Weight 11/25/16 1452 119 lb (54 kg)     Height --      Head Circumference --      Peak Flow --      Pain Score --      Pain Loc --      Pain Edu? --      Excl. in GC? --     Physical Exam  Constitutional: He is oriented to person, place, and time. He appears well-developed and well-nourished.  HENT:  Head:  Normocephalic and atraumatic.  Mouth/Throat: Oropharynx is clear and moist.  Eyes: Conjunctivae and EOM are normal. Pupils are equal, round, and reactive to light.  No nystagmus  Neck: Normal range of motion. Neck supple.  Cardiovascular: Normal rate, regular rhythm, normal heart sounds and intact distal pulses.   No murmur heard. Pulmonary/Chest: Effort normal and breath sounds normal. No respiratory distress.  Abdominal: Soft. He exhibits no distension. There is no tenderness. There is no guarding.  Musculoskeletal: He exhibits no edema or tenderness.  Neurological: He is alert and oriented to person, place, and time. He has normal strength. No cranial nerve deficit or sensory deficit. He exhibits normal muscle tone. He displays a negative Romberg sign. Coordination and gait normal. GCS eye subscore is 4. GCS verbal subscore is 5. GCS motor subscore is 6.  Skin: Skin is warm and dry.  Psychiatric: He has a normal mood and affect.  Nursing note and vitals  reviewed.    ED Treatments / Results  Labs (all labs ordered are listed, but only abnormal results are displayed) Labs Reviewed  CBG MONITORING, ED    EKG  EKG Interpretation  Date/Time:  Thursday Nov 25 2016 17:06:07 EDT Ventricular Rate:  60 PR Interval:    QRS Duration: 93 QT Interval:  462 QTC Calculation: 462 R Axis:   27 Text Interpretation:  Sinus arrhythmia Since last tracing rate slower Abnormal ekg Confirmed by Eber HongMiller, Brian ([id number]) on 11/25/2016 6:59:02 PM       Radiology No results found.  Procedures Procedures (including critical care time)  Medications Ordered in ED Medications - No data to display   Initial Impression / Assessment and Plan / ED Course  I have reviewed the triage vital signs and the nursing notes.  Pertinent labs & imaging results that were available during my care of the patient were reviewed by me and considered in my medical decision making (see chart for details).     Patient is a 83 year old male with a history of seizures who presents with EMS for concern of possible episode of altered mental status. Per report his daughter left him at his normal self around 10:30 and when she returned around 2:30 he was "rigid as well as incontinent of urine and confused." He was lying in bed at the time. Upon my evaluation vital signs are all reassuring without a fever, tachycardia, and reassuring blood pressures. No significant findings on exam and he is ambulatory with a steady gait and no focal neuro deficits. He states that he has been taking HIS medications appropriately and that he has not had any fevers, chest pain, shortness of breath, headache, states "I don't know why she called the ambulance." Patient's son arrived here and states that he does not know why his sister called the ambulance and that his dad is normal to him. He states that his sister was "more worried about the dog acting funny in my dad". At this time I doubt acute infective or  surgical pathology. There is a concern for possible seizures no evidence for status the patient is otherwise well appearing without signs of injury. No acute traumatic findings. Advised to continue taking his seizure medications and follow up with his doctor within 5 days.  Patient stable for discharge home with son.  I have reviewed all results with the patient. Patient agrees to stated plan. All questions answered. Advised to call or return to have any questions, new symptoms, change in symptoms, or symptoms that they do not  understand.   Final Clinical Impressions(s) / ED Diagnoses   Final diagnoses:  Altered consciousness    New Prescriptions Discharge Medication List as of 11/25/2016  5:06 PM       Marijean Niemann, MD 11/26/16 1954    Eber Hong, MD 12/01/16 [phone]

## 2019-11-19 ENCOUNTER — Emergency Department (HOSPITAL_COMMUNITY): Payer: Medicare Other

## 2019-11-19 ENCOUNTER — Emergency Department (HOSPITAL_COMMUNITY)
Admission: EM | Admit: 2019-11-19 | Discharge: 2019-11-19 | Disposition: A | Discharge status: Home / Self Care | Payer: Medicare Other | Attending: Emergency Medicine | Admitting: Emergency Medicine

## 2019-11-19 ENCOUNTER — Encounter (HOSPITAL_COMMUNITY): Payer: Self-pay | Admitting: Emergency Medicine

## 2019-11-19 DIAGNOSIS — I10 Essential (primary) hypertension: Secondary | ICD-10-CM | POA: Insufficient documentation

## 2019-11-19 DIAGNOSIS — Z043 Encounter for examination and observation following other accident: Secondary | ICD-10-CM | POA: Diagnosis not present

## 2019-11-19 DIAGNOSIS — Z7982 Long term (current) use of aspirin: Secondary | ICD-10-CM | POA: Diagnosis not present

## 2019-11-19 DIAGNOSIS — Z79899 Other long term (current) drug therapy: Secondary | ICD-10-CM | POA: Diagnosis not present

## 2019-11-19 DIAGNOSIS — E860 Dehydration: Secondary | ICD-10-CM | POA: Diagnosis not present

## 2019-11-19 DIAGNOSIS — F17210 Nicotine dependence, cigarettes, uncomplicated: Secondary | ICD-10-CM | POA: Insufficient documentation

## 2019-11-19 DIAGNOSIS — W19XXXA Unspecified fall, initial encounter: Secondary | ICD-10-CM

## 2019-11-19 LAB — BASIC METABOLIC PANEL
Anion gap: 10 (ref 5–15)
BUN: 21 mg/dL (ref 8–23)
CO2: 23 mmol/L (ref 22–32)
Calcium: 8.7 mg/dL — ABNORMAL LOW (ref 8.9–10.3)
Chloride: 104 mmol/L (ref 98–111)
Creatinine, Ser: 1.44 mg/dL — ABNORMAL HIGH (ref 0.61–1.24)
GFR calc Af Amer: 51 mL/min — ABNORMAL LOW (ref >60)
GFR calc non Af Amer: 44 mL/min — ABNORMAL LOW (ref >60)
Glucose, Bld: 93 mg/dL (ref 70–99)
Potassium: 3.7 mmol/L (ref 3.5–5.1)
Sodium: 137 mmol/L (ref 135–145)

## 2019-11-19 LAB — URINALYSIS, ROUTINE W REFLEX MICROSCOPIC
Bacteria, UA: NONE SEEN
Bilirubin Urine: NEGATIVE
Glucose, UA: NEGATIVE mg/dL
Hgb urine dipstick: NEGATIVE
Ketones, ur: NEGATIVE mg/dL
Nitrite: NEGATIVE
Protein, ur: NEGATIVE mg/dL
Specific Gravity, Urine: 1.015 (ref 1.005–1.030)
pH: 5 (ref 5.0–8.0)

## 2019-11-19 LAB — CBC
HCT: 30 % — ABNORMAL LOW (ref 39.0–52.0)
Hemoglobin: 9.8 g/dL — ABNORMAL LOW (ref 13.0–17.0)
MCH: 30 pg (ref 26.0–34.0)
MCHC: 32.7 g/dL (ref 30.0–36.0)
MCV: 91.7 fL (ref 80.0–100.0)
Platelets: 173 10*3/uL (ref 150–400)
RBC: 3.27 MIL/uL — ABNORMAL LOW (ref 4.22–5.81)
RDW: 12.9 % (ref 11.5–15.5)
WBC: 5.6 10*3/uL (ref 4.0–10.5)
nRBC: 0 % (ref 0.0–0.2)

## 2019-11-19 LAB — CBG MONITORING, ED: Glucose-Capillary: 69 mg/dL — ABNORMAL LOW (ref 70–99)

## 2019-11-19 LAB — CK: Total CK: 208 U/L (ref 49–397)

## 2019-11-19 MED ORDER — SODIUM CHLORIDE 0.9% FLUSH: 3.0000 mL | Freq: Once | INTRAVENOUS | Status: DC

## 2019-11-19 NOTE — ED Notes (Signed)
4 attempts at iv access failed. Patient's forearm veins roll excessively and 2 tries at antecubital blew immediately on entrance to vein. Calling iv team.

## 2019-11-19 NOTE — ED Triage Notes (Signed)
Pt states he was in the bed and "woke up on the floor" around 0600 this morning; denies pain or injury. Pt unsure how he got on the floor. Pt provider told him to come to the ER.

## 2019-11-19 NOTE — ED Provider Notes (Signed)
[facility]-EMERGENCY DEPT Provider Note   CSN: [id number] Arrival date & time: 11/19/19  1451     History Chief Complaint  Patient presents with  . Fall    Carlos Shields is a 86 y.o. male with a past medical history significant for hypertension, pancreatitis, and seizures on Keppra who presents to the ED after a fall that occurred earlier this morning. Daughter at bedside and notes that patient was found on the floor around noon today and she expects he was on the floor since 6am. Patient lives alone and is checked on frequently by his daughter. Patient notes he "just fell" on the ground and couldn't get back up. Denies head injury. No physical complaints. Daughter called patient's PCP who recommended he reports to the ED for further evaluation. Patient is currently on ASA 81mg , but no other blood thinners. Patient denies headache, chest pain, back pain, neck pain. Denies hip pain.   Daughter also notes patient fell roughly 1 month ago and was evaluated by his PCP.   History obtained from patient, daughter, and past medical records. No interpreter used during encounter.      Past Medical History:  Diagnosis Date  . Hypertension   . Pancreatitis   . Seizures [facility])     Patient Active Problem List   Diagnosis Date Noted  . Syncope 01/13/2012  . Anemia 12/09/2011  . Seizures (HCC) 12/08/2011  . HTN (hypertension) 12/08/2011  . Encephalopathy 12/08/2011    Past Surgical History:  Procedure Laterality Date  . HERNIA REPAIR         Family History  Problem Relation Age of Onset  . Hypertension Other   . Diabetes Other   . Asthma Other     Social History   Tobacco Use  . Smoking status: Current Some Day Smoker    Types: Cigars  . Smokeless tobacco: Never Used  Substance Use Topics  . Alcohol use: No  . Drug use: No    Home Medications Prior to Admission medications   Medication Sig Start Date End Date Taking? Authorizing Provider  aspirin EC  81 MG tablet Take 81 mg by mouth every morning.   Yes [provider]  levETIRAcetam (KEPPRA) 500 MG tablet Take 1 tablet (500 mg total) by mouth 2 (two) times daily. 05/04/13  Yes Buriev, 05/06/13, MD  metoprolol tartrate (LOPRESSOR) 50 MG tablet Take 50 mg by mouth daily. 10/11/16  Yes [provider]    Allergies    Patient has no known allergies.  Review of Systems   Review of Systems  Constitutional: Negative for chills and fever.  HENT: Negative for rhinorrhea and sore throat.   Eyes: Negative for visual disturbance.  Respiratory: Negative for shortness of breath.   Cardiovascular: Negative for chest pain and palpitations.  Gastrointestinal: Negative for abdominal pain.  Genitourinary: Negative for dysuria.  Musculoskeletal: Negative for myalgias.  Skin: Negative for color change and rash.  Neurological: Negative for dizziness and light-headedness.    Physical Exam Updated Vital Signs BP (!) 157/73 (BP Location: Left Arm) Comment: Simultaneous filing. User may not have seen previous data.  Pulse (!) 56   Temp 98.6 F (37 C) (Oral)   Resp 13 Comment: Simultaneous filing. User may not have seen previous data.  Ht 5\' 4"  (1.626 m)   Wt 54.4 kg   SpO2 100%   BMI 20.60 kg/m   Physical Exam Vitals and nursing note reviewed.  Constitutional:      General:  He is not in acute distress.    Appearance: He is not ill-appearing.  HENT:     Head: Normocephalic.  Eyes:     Pupils: Pupils are equal, round, and reactive to light.  Neck:     Comments: No cervical midline tenderness.  Cardiovascular:     Rate and Rhythm: Normal rate and regular rhythm.     Pulses: Normal pulses.     Heart sounds: Normal heart sounds. No murmur. No friction rub. No gallop.   Pulmonary:     Effort: Pulmonary effort is normal.     Breath sounds: Normal breath sounds.  Abdominal:     General: Abdomen is flat. Bowel sounds are normal. There is no distension.     Palpations:  Abdomen is soft.     Tenderness: There is no abdominal tenderness. There is no guarding or rebound.  Musculoskeletal:     Cervical back: Neck supple.     Comments: No T-spine and L-spine midline tenderness, no stepoff or deformity, no paraspinal tenderness No leg edema bilaterally Patient moves all extremities without difficulty. DP/PT pulses 2+ and equal bilaterally Sensation grossly intact bilaterally Strength of knee flexion and extension is 5/5 Plantar and dorsiflexion of ankle 5/5 Achilles and patellar reflexes present and equal   Skin:    General: Skin is warm and dry.  Neurological:     General: No focal deficit present.     Mental Status: He is alert.     Comments: Speech is clear, able to follow commands CN III-XII intact Normal strength in upper and lower extremities bilaterally including dorsiflexion and plantar flexion, strong and equal grip strength Sensation grossly intact throughout Moves extremities without ataxia, coordination intact No pronator drift   Psychiatric:        Mood and Affect: Mood normal.        Behavior: Behavior normal.     ED Results / Procedures / Treatments   Labs (all labs ordered are listed, but only abnormal results are displayed) Labs Reviewed  BASIC METABOLIC PANEL - Abnormal; Notable for the following components:      Result Value   Creatinine, Ser 1.44 (*)    Calcium 8.7 (*)    GFR calc non Af Amer 44 (*)    GFR calc Af Amer 51 (*)    All other components within normal limits  CBC - Abnormal; Notable for the following components:   RBC 3.27 (*)    Hemoglobin 9.8 (*)    HCT 30.0 (*)    All other components within normal limits  URINALYSIS, ROUTINE W REFLEX MICROSCOPIC - Abnormal; Notable for the following components:   Leukocytes,Ua TRACE (*)    All other components within normal limits  CBG MONITORING, ED - Abnormal; Notable for the following components:   Glucose-Capillary 69 (*)    All other components within normal  limits  URINE CULTURE  CK    EKG EKG Interpretation  Date/Time:  Monday Nov 19 2019 15:14:04 EDT Ventricular Rate:  61 PR Interval:    QRS Duration: 81 QT Interval:  435 QTC Calculation: 439 R Axis:   -35 Text Interpretation: Sinus rhythm Left axis deviation 12 Lead; Mason-Likar When compared to prior, no significant changes seen. NO STEMI Confirmed by Theda Belfast ([id number]) on 11/19/2019 7:39:11 PM   Radiology DG Chest Portable 1 View  Result Date: 11/19/2019 CLINICAL DATA:  Larey Seat today.  Possible syncope. EXAM: PORTABLE CHEST 1 VIEW COMPARISON:  05/04/2013 FINDINGS: Heart size is normal. Question  coronary artery calcification versus is left system stent. Aortic atherosclerotic calcification. Pulmonary vascularity is normal. The lungs are clear. No effusions. No acute bone finding. IMPRESSION: No active disease. Question left system stent versus coronary artery calcification. Electronically Signed   By: Paulina Fusi M.D.   On: 11/19/2019 20:20    Procedures Procedures (including critical care time)  Medications Ordered in ED Medications  sodium chloride flush (NS) 0.9 % injection 3 mL (has no administration in time range)    ED Course  I have reviewed the triage vital signs and the nursing notes.  Pertinent labs & imaging results that were available during my care of the patient were reviewed by me and considered in my medical decision making (see chart for details).  Clinical Course as of Nov 18 2340  Mon Nov 19, 2019  1914 Attempted to call daughter with number provided   [CA]  2242 CK Total: 208 [CA]    Clinical Course User Index [CA] Mannie Stabile, PA-C   MDM Rules/Calculators/A&P                     86 year old male presents to the ED after being found on the ground around noon today. Daughter at bedside believes patient was on the floor from 6am to noon. Patient has no physical complaints. Unsure if fall was mechanical vs. Syncopal vs. Other causes. Stable  vitals. Patient is afebrile, not tachycardic or hypoxic. Patient in no acute distress and non-toxic appearing. Physical exam unremarkable. No cervical, thoracic, or lumbar midline tenderness. No tenderness at bilateral hips. Abdomen soft, non-distended, and non-tender.  Normal neurological exam. Will obtain routine labs, UA, and CK to rule out any acute abnormalities given unknown cause of fall.  CBC reassuring with no leukocytosis.  Hemoglobin at baseline at 9.8.  BMP significant for elevated creatinine at 1.44.  UA significant for trace leukocytes, but no bacteria.  We will hold antibiotics at this time.  Urine culture pending. CK normal at 208. No concern for rhabdo at this time. CXR personally reviewed which is negative for any acute abnormalities. EKG personally reviewed which demonstrates normal sinus rhythm with no change from previous EKG.   Patient able to ambulate here in the ED with cane. Home health referral placed for patient for help at home. Advised patient to follow-up with PCP within the next week for further evaluation. Patient still denies any physical complaints. Advised patient that if he develops pain, he may take over the counter Tylenol. Strict ED precautions discussed with patient. Patient states understanding and agrees to plan. Patient discharged home in no acute distress and stable vitals  Discussed case with Dr. Rush Landmark who evaluated patient at bedside and agrees with assessment and plan.   Final Clinical Impression(s) / ED Diagnoses Final diagnoses:  Fall, initial encounter    Rx / DC Orders ED Discharge Orders         Ordered    Home Health     11/19/19 2329    Face-to-face encounter (required for Medicare/Medicaid patients)    Comments: I Mannie Stabile certify that this patient is under my care and that I, or a nurse practitioner or physician's assistant working with me, had a face-to-face encounter that meets the physician face-to-face encounter requirements  with this patient on 11/19/2019. The encounter with the patient was in whole, or in part for the following medical condition(s) which is the primary reason for home health care (List medical condition): home  health needs   11/19/19 2329           Karie Kirks 11/19/19 2342    Tegeler, Gwenyth Allegra, MD 11/20/19 1051

## 2019-11-19 NOTE — Discharge Instructions (Addendum)
As discussed, all of your labs were reassuring today. I have placed an order for home health for extra help at home. Expect a phone call this week for an appointment. If you develop any pain, you may take over the counter Tylenol as needed. Please follow-up with PCP within the next week for further evaluation. Return to the ER for new or worsening symptoms.

## 2019-11-19 NOTE — ED Notes (Signed)
Could not get pt blood work 

## 2019-11-21 LAB — URINE CULTURE: Culture: <10000 — AB

## 2021-03-05 ENCOUNTER — Ambulatory Visit (HOSPITAL_COMMUNITY)
Admission: EM | Admit: 2021-03-05 | Discharge: 2021-03-05 | Disposition: A | Discharge status: Home / Self Care | Payer: Medicare Other | Attending: Internal Medicine | Admitting: Internal Medicine

## 2021-03-05 ENCOUNTER — Encounter (HOSPITAL_COMMUNITY): Payer: Self-pay

## 2021-03-05 ENCOUNTER — Other Ambulatory Visit: Payer: Self-pay

## 2021-03-05 DIAGNOSIS — J340 Abscess, furuncle and carbuncle of nose: Secondary | ICD-10-CM

## 2021-03-05 MED ORDER — MUPIROCIN CALCIUM 2 % NA OINT: TOPICAL_OINTMENT | NASAL | 0 refills | Status: DC

## 2021-03-05 MED ORDER — DOXYCYCLINE HYCLATE 100 MG PO CAPS
100.0000 mg | ORAL_CAPSULE | Freq: Two times a day (BID) | ORAL | 0 refills | Status: DC
Start: 2021-03-05 — End: 2021-04-28

## 2021-03-05 NOTE — Discharge Instructions (Addendum)
You should be getting better in 2-3 days, but if you get worse, you need to go to the hospital to have lab work done.

## 2021-03-05 NOTE — ED Provider Notes (Signed)
[facility]    CSN: [id number] Arrival date & time: 03/05/21  1519      History   Chief Complaint Chief Complaint  Patient presents with   Facial Swelling    HPI Carlos Shields is a 87 y.o. male who presents with his daughter who states when he went to check on him today noticed his R face is swollen and tip of his nose. Pt states he has mild rhinitis today, but denies HA, body aches, N/V/D, cough, CP or SOB ir UTI symptoms. Has been eating fine. Has had covid injections and flu shot is up to date. States his nose has been itching on the L and he has been picking at his nose. Denies hx of abscesses.     Past Medical History:  Diagnosis Date   Hypertension    Pancreatitis    Seizures [facility])     Patient Active Problem List   Diagnosis Date Noted   Syncope 01/13/2012   Anemia 12/09/2011   Seizures (HCC) 12/08/2011   HTN (hypertension) 12/08/2011   Encephalopathy 12/08/2011    Past Surgical History:  Procedure Laterality Date   HERNIA REPAIR         Home Medications    Prior to Admission medications   Medication Sig Start Date End Date Taking? Authorizing Provider  doxycycline (VIBRAMYCIN) 100 MG capsule Take 1 capsule (100 mg total) by mouth 2 (two) times daily. 03/05/21  Yes Rodriguez-Southworth, Nettie Elm, PA-C  mupirocin nasal ointment (BACTROBAN) 2 % Apply in each nostril bid x 10 days 03/05/21  Yes Rodriguez-Southworth, Viviana Simpler  aspirin EC 81 MG tablet Take 81 mg by mouth every morning.    [provider]  levETIRAcetam (KEPPRA) 500 MG tablet Take 1 tablet (500 mg total) by mouth 2 (two) times daily. 05/04/13   [name], MD  metoprolol tartrate (LOPRESSOR) 50 MG tablet Take 50 mg by mouth daily. 10/11/16   [provider]    Family History Family History  Problem Relation Age of Onset   Hypertension Other    Diabetes Other    Asthma Other     Social History Social History   Tobacco Use   Smoking status: Some Days     Types: Cigars   Smokeless tobacco: Never  Substance Use Topics   Alcohol use: No   Drug use: No     Allergies   Patient has no known allergies.   Review of Systems Review of Systems + rhinitis, nose itching on L side. The rest is neg.   Physical Exam Triage Vital Signs ED Triage Vitals  Enc Vitals Group     BP 03/05/21 1550 138/66     Pulse Rate 03/05/21 1550 65     Resp --      Temp 03/05/21 1550 (!) 100.9 F (38.3 C)     Temp Source 03/05/21 1550 Oral     SpO2 03/05/21 1550 100 %     Weight --      Height --      Head Circumference --      Peak Flow --      Pain Score 03/05/21 1559 0     Pain Loc --      Pain Edu? --      Excl. in GC? --    No data found.  Updated Vital Signs BP 138/66 (BP Location: Left Arm)   Pulse 65   Temp (!) 100.9 F (38.3 C) (Oral)   SpO2 100%  Visual Acuity Right Eye Distance:   Left Eye Distance:   Bilateral Distance:    Right Eye Near:   Left Eye Near:    Bilateral Near:     Physical Exam Vitals and nursing note reviewed.  Constitutional:      General: He is not in acute distress.    Appearance: He is normal weight. He is not toxic-appearing.  HENT:     Head: Normocephalic.     Comments: R face cheek is a little swollen compared to the L, but there is no erythema on this area and is not tender    Right Ear: External ear normal.     Left Ear: External ear normal.     Nose: Rhinorrhea present.     Comments: The tip of his nose is red. Has scabbing with a bloody crust on the inner L distal septal area. Is mildly tender.  Eyes:     General: No scleral icterus.    Conjunctiva/sclera: Conjunctivae normal.  Cardiovascular:     Rate and Rhythm: Normal rate and regular rhythm.  Pulmonary:     Effort: Pulmonary effort is normal.     Breath sounds: Normal breath sounds.  Musculoskeletal:        General: Normal range of motion.     Cervical back: Neck supple.  Lymphadenopathy:     Cervical: No cervical adenopathy.   Skin:    General: Skin is warm and dry.  Neurological:     Mental Status: He is alert and oriented to person, place, and time.     Gait: Gait normal.  Psychiatric:        Mood and Affect: Mood normal.        Behavior: Behavior normal.     UC Treatments / Results  Labs (all labs ordered are listed, but only abnormal results are displayed) Labs Reviewed - No data to display  EKG   Radiology No results found.  Procedures Procedures (including critical care time)  Medications Ordered in UC Medications - No data to display  Initial Impression / Assessment and Plan / UC Course  I have reviewed the triage vital signs and the nursing notes. Has cellulitis of nose. I placed him on Doxy and Bactroban ointment as noted. See instructions.      Final Clinical Impressions(s) / UC Diagnoses   Final diagnoses:  Cellulitis of nose     Discharge Instructions      You should be getting better in 2-3 days, but if you get worse, you need to go to the hospital to have lab work done.      ED Prescriptions     Medication Sig Dispense Auth. Provider   mupirocin nasal ointment (BACTROBAN) 2 % Apply in each nostril bid x 10 days 1 g Rodriguez-Southworth, Nettie Elm, PA-C   doxycycline (VIBRAMYCIN) 100 MG capsule Take 1 capsule (100 mg total) by mouth 2 (two) times daily. 20 capsule Rodriguez-Southworth, Nettie Elm, PA-C      PDMP not reviewed this encounter.   Garey Ham, [place] 03/05/21 1625

## 2021-03-05 NOTE — ED Triage Notes (Signed)
Pt presents with swelling on his nose since yesterday. States the inside of his nose itches. Pt daughter states she has noticed swelling around his cheeks.

## 2021-04-25 ENCOUNTER — Emergency Department (HOSPITAL_COMMUNITY): Payer: Medicare Other

## 2021-04-25 ENCOUNTER — Observation Stay (HOSPITAL_COMMUNITY)
Admission: EM | Admit: 2021-04-25 | Discharge: 2021-04-28 | Disposition: A | Discharge status: Home / Self Care | Payer: Medicare Other | Attending: Family Medicine | Admitting: Family Medicine

## 2021-04-25 ENCOUNTER — Other Ambulatory Visit: Payer: Self-pay

## 2021-04-25 DIAGNOSIS — I129 Hypertensive chronic kidney disease with stage 1 through stage 4 chronic kidney disease, or unspecified chronic kidney disease: Secondary | ICD-10-CM | POA: Diagnosis not present

## 2021-04-25 DIAGNOSIS — N183 Chronic kidney disease, stage 3 unspecified: Secondary | ICD-10-CM | POA: Insufficient documentation

## 2021-04-25 DIAGNOSIS — Z8673 Personal history of transient ischemic attack (TIA), and cerebral infarction without residual deficits: Secondary | ICD-10-CM | POA: Insufficient documentation

## 2021-04-25 DIAGNOSIS — Z20822 Contact with and (suspected) exposure to covid-19: Secondary | ICD-10-CM | POA: Insufficient documentation

## 2021-04-25 DIAGNOSIS — R531 Weakness: Principal | ICD-10-CM | POA: Insufficient documentation

## 2021-04-25 DIAGNOSIS — I10 Essential (primary) hypertension: Secondary | ICD-10-CM | POA: Diagnosis present

## 2021-04-25 DIAGNOSIS — R569 Unspecified convulsions: Secondary | ICD-10-CM

## 2021-04-25 DIAGNOSIS — F17290 Nicotine dependence, other tobacco product, uncomplicated: Secondary | ICD-10-CM | POA: Diagnosis not present

## 2021-04-25 LAB — URINALYSIS, COMPLETE (UACMP) WITH MICROSCOPIC
Bacteria, UA: NONE SEEN
Bilirubin Urine: NEGATIVE
Glucose, UA: NEGATIVE mg/dL
Ketones, ur: 5 mg/dL — AB
Leukocytes,Ua: NEGATIVE
Nitrite: NEGATIVE
Protein, ur: 30 mg/dL — AB
Specific Gravity, Urine: 1.013 (ref 1.005–1.030)
pH: 6 (ref 5.0–8.0)

## 2021-04-25 LAB — CBC WITH DIFFERENTIAL/PLATELET
Abs Immature Granulocytes: 0.02 10*3/uL (ref 0.00–0.07)
Basophils Absolute: 0 10*3/uL (ref 0.0–0.1)
Basophils Relative: 0 %
Eosinophils Absolute: 0 10*3/uL (ref 0.0–0.5)
Eosinophils Relative: 0 %
HCT: 30.5 % — ABNORMAL LOW (ref 39.0–52.0)
Hemoglobin: 9.8 g/dL — ABNORMAL LOW (ref 13.0–17.0)
Immature Granulocytes: 0 %
Lymphocytes Relative: 15 %
Lymphs Abs: 1.3 10*3/uL (ref 0.7–4.0)
MCH: 29 pg (ref 26.0–34.0)
MCHC: 32.1 g/dL (ref 30.0–36.0)
MCV: 90.2 fL (ref 80.0–100.0)
Monocytes Absolute: 0.9 10*3/uL (ref 0.1–1.0)
Monocytes Relative: 10 %
Neutro Abs: 6.4 10*3/uL (ref 1.7–7.7)
Neutrophils Relative %: 75 %
Platelets: 201 10*3/uL (ref 150–400)
RBC: 3.38 MIL/uL — ABNORMAL LOW (ref 4.22–5.81)
RDW: 13.1 % (ref 11.5–15.5)
WBC: 8.6 10*3/uL (ref 4.0–10.5)
nRBC: 0 % (ref 0.0–0.2)

## 2021-04-25 LAB — RESP PANEL BY RT-PCR (FLU A&B, COVID) ARPGX2
Influenza A by PCR: NEGATIVE
Influenza B by PCR: NEGATIVE
SARS Coronavirus 2 by RT PCR: NEGATIVE

## 2021-04-25 LAB — I-STAT CHEM 8, ED
BUN: 25 mg/dL — ABNORMAL HIGH (ref 8–23)
Calcium, Ion: 1.17 mmol/L (ref 1.15–1.40)
Chloride: 105 mmol/L (ref 98–111)
Creatinine, Ser: 1.4 mg/dL — ABNORMAL HIGH (ref 0.61–1.24)
Glucose, Bld: 102 mg/dL — ABNORMAL HIGH (ref 70–99)
HCT: 31 % — ABNORMAL LOW (ref 39.0–52.0)
Hemoglobin: 10.5 g/dL — ABNORMAL LOW (ref 13.0–17.0)
Potassium: 4 mmol/L (ref 3.5–5.1)
Sodium: 137 mmol/L (ref 135–145)
TCO2: 23 mmol/L (ref 22–32)

## 2021-04-25 LAB — PROTIME-INR
INR: 1.2 (ref 0.8–1.2)
Prothrombin Time: 14.9 seconds (ref 11.4–15.2)

## 2021-04-25 LAB — COMPREHENSIVE METABOLIC PANEL
ALT: 18 U/L (ref 0–44)
AST: 42 U/L — ABNORMAL HIGH (ref 15–41)
Albumin: 4.3 g/dL (ref 3.5–5.0)
Alkaline Phosphatase: 70 U/L (ref 38–126)
Anion gap: 10 (ref 5–15)
BUN: 24 mg/dL — ABNORMAL HIGH (ref 8–23)
CO2: 21 mmol/L — ABNORMAL LOW (ref 22–32)
Calcium: 9.2 mg/dL (ref 8.9–10.3)
Chloride: 103 mmol/L (ref 98–111)
Creatinine, Ser: 1.43 mg/dL — ABNORMAL HIGH (ref 0.61–1.24)
GFR, Estimated: 47 mL/min — ABNORMAL LOW (ref >60)
Glucose, Bld: 110 mg/dL — ABNORMAL HIGH (ref 70–99)
Potassium: 4 mmol/L (ref 3.5–5.1)
Sodium: 134 mmol/L — ABNORMAL LOW (ref 135–145)
Total Bilirubin: 1.7 mg/dL — ABNORMAL HIGH (ref 0.3–1.2)
Total Protein: 8 g/dL (ref 6.5–8.1)

## 2021-04-25 LAB — MAGNESIUM: Magnesium: 1.8 mg/dL (ref 1.7–2.4)

## 2021-04-25 LAB — CK
Total CK: 795 U/L — ABNORMAL HIGH (ref 49–397)
Total CK: 772 U/L — ABNORMAL HIGH (ref 49–397)

## 2021-04-25 LAB — LACTIC ACID, PLASMA: Lactic Acid, Venous: 1.9 mmol/L (ref 0.5–1.9)

## 2021-04-25 LAB — CBG MONITORING, ED: Glucose-Capillary: 101 mg/dL — ABNORMAL HIGH (ref 70–99)

## 2021-04-25 MED ORDER — SODIUM CHLORIDE 0.9 % IV BOLUS
1000.0000 mL | Freq: Once | INTRAVENOUS | Status: AC
  Administered 2021-04-25: 1000 mL via INTRAVENOUS

## 2021-04-25 MED ORDER — LEVETIRACETAM IN NACL 1000 MG/100ML IV SOLN
1000.0000 mg | Freq: Once | INTRAVENOUS | Status: AC
  Administered 2021-04-25: 1000 mg via INTRAVENOUS
  Filled 2021-04-25: qty 100

## 2021-04-25 NOTE — ED Notes (Signed)
Multiple attempts made to capture clear EKG Pt consistently shaking, which causes too much artifact  Will continue to attempt to capture EKG RN Lelan Pons notified

## 2021-04-25 NOTE — ED Notes (Signed)
RN attempted to assist patient up on the side of the bed. Patient can not hold himself up and couldn't sit on the side of bed by himself

## 2021-04-25 NOTE — ED Triage Notes (Signed)
Patient BIB EMS from home c/o weakness. Per report family reported falls x3 today and unable to get up at home. Pt denies N/V/D. No reports of fever and SOB. Pt a/ox2.  BP 150/90 HR 76 RR 20 O2sat 98% on RA CBG 115

## 2021-04-25 NOTE — ED Provider Notes (Signed)
[facility]-EMERGENCY DEPT Provider Note   CSN: [id number] Arrival date & time: 04/25/21  1642     History Chief Complaint  Patient presents with   Fall   Weakness    Daylin Gruszka is a 87 y.o. male.  HPI Patient presents from home, where he lives with his daughter, for weakness.  Last night, he was in his normal state of health.  At baseline, he is able to ambulate small distances in the house.  He does have a seizure disorder and takes Keppra twice daily.  Per his daughter, he has been taking all of his doses.  He has a remote history of alcohol abuse.  Patient states that he has not drank alcohol in nearly 10 years.  This morning, he was on the floor and too weak to stand up.  His daughter states that when she found him, he had urinated on himself.  Patient is uncertain of how he ended up there or why he felt too weak to stand up.  Currently, he continues to feel weak.  He denies any areas of focal numbness or weakness.  He denies any changes to his vision.  He denies any current areas of pain or discomfort.  He has not had any recent vomiting or diarrhea.  Patient's daughter states that he typically has a good appetite.  He has not eaten today.    Past Medical History:  Diagnosis Date   Hypertension    Pancreatitis    Seizures (HCC)     Patient Active Problem List   Diagnosis Date Noted   CKD (chronic kidney disease), stage III (HCC) 04/26/2021   Weakness generalized 04/26/2021   Weakness 04/25/2021   Syncope 01/13/2012   Anemia 12/09/2011   Seizures (HCC) 12/08/2011   HTN (hypertension) 12/08/2011   Encephalopathy 12/08/2011    Past Surgical History:  Procedure Laterality Date   HERNIA REPAIR         Family History  Problem Relation Age of Onset   Hypertension Other    Diabetes Other    Asthma Other     Social History   Tobacco Use   Smoking status: Some Days    Types: Cigars   Smokeless tobacco: Never  Substance Use Topics   Alcohol  use: No   Drug use: No    Home Medications Prior to Admission medications   Medication Sig Start Date End Date Taking? Authorizing Provider  levETIRAcetam (KEPPRA) 500 MG tablet Take 1 tablet (500 mg total) by mouth 2 (two) times daily. 05/04/13  Yes Buriev, Isaiah Serge, MD  metoprolol tartrate (LOPRESSOR) 50 MG tablet Take 50 mg by mouth daily. 10/11/16  Yes [provider]  multivitamin (ONE-A-DAY MEN'S) TABS tablet Take 1 tablet by mouth daily.   Yes [provider]  doxycycline (VIBRAMYCIN) 100 MG capsule Take 1 capsule (100 mg total) by mouth 2 (two) times daily. Patient not taking: Reported on 04/25/2021 03/05/21   Rodriguez-Southworth, Nettie Elm, PA-C  mupirocin nasal ointment (BACTROBAN) 2 % Apply in each nostril bid x 10 days Patient not taking: Reported on 04/25/2021 03/05/21   Rodriguez-Southworth, Nettie Elm, PA-C    Allergies    Patient has no known allergies.  Review of Systems   Review of Systems  Constitutional:  Positive for fatigue. Negative for chills and fever.  HENT:  Negative for congestion, ear pain and sore throat.   Eyes:  Negative for pain and visual disturbance.  Respiratory:  Negative for cough, chest tightness, shortness of  breath and wheezing.   Cardiovascular:  Negative for chest pain, palpitations and leg swelling.  Gastrointestinal:  Negative for abdominal pain, diarrhea, nausea and vomiting.  Genitourinary:  Negative for dysuria, flank pain and hematuria.  Musculoskeletal:  Positive for gait problem. Negative for arthralgias, back pain, joint swelling and neck pain.  Skin:  Negative for color change and rash.  Neurological:  Positive for weakness (Generalized). Negative for dizziness, seizures, syncope, facial asymmetry, light-headedness, numbness and headaches.  Hematological:  Does not bruise/bleed easily.  Psychiatric/Behavioral:  Positive for confusion.   All other systems reviewed and are negative.  Physical Exam Updated Vital Signs BP  140/69 (BP Location: Left Arm)   Pulse 72   Temp 97.9 F (36.6 C) (Oral)   Resp 17   Ht 5\' 4"  (1.626 m)   Wt 54.4 kg   SpO2 100%   BMI 20.60 kg/m   Physical Exam Vitals and nursing note reviewed.  Constitutional:      General: He is not in acute distress.    Appearance: He is well-developed. He is ill-appearing (Chronically). He is not toxic-appearing or diaphoretic.  HENT:     Head: Normocephalic and atraumatic.     Right Ear: External ear normal.     Left Ear: External ear normal.     Nose: Nose normal.     Mouth/Throat:     Mouth: Mucous membranes are moist.     Comments: Evidence of tongue bite on right anterior tongue, superficial Eyes:     General: No visual field deficit or scleral icterus.    Extraocular Movements: Extraocular movements intact.     Conjunctiva/sclera: Conjunctivae normal.     Pupils: Pupils are equal, round, and reactive to light.  Cardiovascular:     Rate and Rhythm: Normal rate and regular rhythm.     Heart sounds: No murmur heard. Pulmonary:     Effort: Pulmonary effort is normal. No respiratory distress.     Breath sounds: Normal breath sounds. No wheezing or rales.  Chest:     Chest wall: No tenderness.  Abdominal:     Palpations: Abdomen is soft.     Tenderness: There is no abdominal tenderness. There is no right CVA tenderness or left CVA tenderness.  Musculoskeletal:        General: No swelling, tenderness, deformity or signs of injury.     Cervical back: Normal range of motion and neck supple. No rigidity.     Right lower leg: No edema.     Left lower leg: No edema.  Skin:    General: Skin is warm and dry.     Coloration: Skin is not jaundiced.  Neurological:     Mental Status: He is alert.     Cranial Nerves: No facial asymmetry.     Sensory: Sensation is intact. No sensory deficit.     Motor: Abnormal muscle tone (mild diffuse rigidity) present. No weakness.     Coordination: Finger-Nose-Finger Test normal.     Comments:  Movements and verbal responses slowed  Psychiatric:        Mood and Affect: Mood normal.        Behavior: Behavior normal.    ED Results / Procedures / Treatments   Labs (all labs ordered are listed, but only abnormal results are displayed) Labs Reviewed  COMPREHENSIVE METABOLIC PANEL - Abnormal; Notable for the following components:      Result Value   Sodium 134 (*)    CO2 21 (*)  Glucose, Bld 110 (*)    BUN 24 (*)    Creatinine, Ser 1.43 (*)    AST 42 (*)    Total Bilirubin 1.7 (*)    GFR, Estimated 47 (*)    All other components within normal limits  CBC WITH DIFFERENTIAL/PLATELET - Abnormal; Notable for the following components:   RBC 3.38 (*)    Hemoglobin 9.8 (*)    HCT 30.5 (*)    All other components within normal limits  URINALYSIS, COMPLETE (UACMP) WITH MICROSCOPIC - Abnormal; Notable for the following components:   Hgb urine dipstick MODERATE (*)    Ketones, ur 5 (*)    Protein, ur 30 (*)    All other components within normal limits  CK - Abnormal; Notable for the following components:   Total CK 795 (*)    All other components within normal limits  CK - Abnormal; Notable for the following components:   Total CK 772 (*)    All other components within normal limits  CBG MONITORING, ED - Abnormal; Notable for the following components:   Glucose-Capillary 101 (*)    All other components within normal limits  I-STAT CHEM 8, ED - Abnormal; Notable for the following components:   BUN 25 (*)    Creatinine, Ser 1.40 (*)    Glucose, Bld 102 (*)    Hemoglobin 10.5 (*)    HCT 31.0 (*)    All other components within normal limits  RESP PANEL BY RT-PCR (FLU A&B, COVID) ARPGX2  MAGNESIUM  LACTIC ACID, PLASMA  PROTIME-INR    EKG None  Radiology CT HEAD WO CONTRAST  Result Date: 04/25/2021 CLINICAL DATA:  Fall.  Mental status change, unknown cause EXAM: CT HEAD WITHOUT CONTRAST TECHNIQUE: Contiguous axial images were obtained from the base of the skull  through the vertex without intravenous contrast. COMPARISON:  05/03/2013 FINDINGS: Brain: There is atrophy and chronic small vessel disease changes. No acute intracranial abnormality. Specifically, no hemorrhage, hydrocephalus, mass lesion, acute infarction, or significant intracranial injury. Vascular: No hyperdense vessel or unexpected calcification. Skull: No acute calvarial abnormality. Sinuses/Orbits: Near complete opacification of the right maxillary sinus. Mucosal thickening in scattered ethmoid air cells. No air-fluid levels. Other: None IMPRESSION: Atrophy, chronic microvascular disease. No acute intracranial abnormality. Electronically Signed   By: Charlett Nose M.D.   On: 04/25/2021 18:31   MR BRAIN WO CONTRAST  Result Date: 04/26/2021 CLINICAL DATA:  TIA EXAM: MRI HEAD WITHOUT CONTRAST TECHNIQUE: Multiplanar, multiecho pulse sequences of the brain and surrounding structures were obtained without intravenous contrast. COMPARISON:  01/14/2012 FINDINGS: Brain: No acute infarction, hemorrhage, hydrocephalus, extra-axial collection or mass lesion. Advanced brain atrophy, especially severe in the medial temporal lobes. Chronic small vessel ischemia that is confluent in the hemispheric white matter with chronic lacunar infarcts specifically seen at the bilateral thalamus and brainstem. Small remote left parietal cortex infarct. Vascular: Normal flow voids. Skull and upper cervical spine: Normal marrow signal. Sinuses/Orbits: Right maxillary sinusitis with complete opacification from peripheral mucosal thickening and central inspissated material, long-standing when compared to 2013. Interval clearance of the left maxillary sinus IMPRESSION: 1. No acute finding. 2. Advanced brain atrophy and chronic small vessel disease. Electronically Signed   By: Tiburcio Pea M.D.   On: 04/26/2021 10:35   DG Pelvis Portable  Result Date: 04/25/2021 CLINICAL DATA:  Fall EXAM: PORTABLE PELVIS 1-2 VIEWS COMPARISON:   None. FINDINGS: There is no evidence of pelvic fracture or diastasis. No pelvic bone lesions are seen. IMPRESSION: Negative.  Electronically Signed   By: Charlett Nose M.D.   On: 04/25/2021 18:30   DG Chest Port 1 View  Result Date: 04/25/2021 CLINICAL DATA:  Altered mental status EXAM: PORTABLE CHEST 1 VIEW COMPARISON:  11/19/2019 FINDINGS: The heart size and mediastinal contours are within normal limits. Both lungs are clear. The visualized skeletal structures are unremarkable. IMPRESSION: No active disease. Electronically Signed   By: Charlett Nose M.D.   On: 04/25/2021 18:30    Procedures Procedures   Medications Ordered in ED Medications  metoprolol tartrate (LOPRESSOR) tablet 50 mg (50 mg Oral Given 04/26/21 0859)  levETIRAcetam (KEPPRA) tablet 500 mg (500 mg Oral Given 04/26/21 0859)  multivitamin with minerals tablet 1 tablet (1 tablet Oral Given 04/26/21 0859)  enoxaparin (LOVENOX) injection 30 mg (30 mg Subcutaneous Given 04/26/21 0900)  0.9 %  sodium chloride infusion ( Intravenous New Bag/Given 04/26/21 0140)  MEDLINE mouth rinse (15 mLs Mouth Rinse Given 04/26/21 0900)  sodium chloride 0.9 % bolus 1,000 mL (0 mLs Intravenous Stopped 04/25/21 1854)  levETIRAcetam (KEPPRA) IVPB 1000 mg/100 mL premix (0 mg Intravenous Stopped 04/25/21 1736)    ED Course  I have reviewed the triage vital signs and the nursing notes.  Pertinent labs & imaging results that were available during my care of the patient were reviewed by me and considered in my medical decision making (see chart for details).    MDM Rules/Calculators/A&P                          Patient is a 87 year old male with history of seizure disorder presenting for generalized weakness.  Vital signs on arrival notable for moderate hypertension.  Patient is afebrile.  He appears mildly confused.  His verbal responses and physical movements are slowed.  I am not able to identify any focal neurologic deficits.  He does have  evidence of a tongue bite.  There was report of urinary incontinence on scene.  Patient may have had a seizure and is simply postictal from that.  Keppra load was given.  Bolus of IV fluids was ordered.  Diagnostic work-up was initiated.  Noncontrasted CT scan of head showed no acute findings.  Patient's lab work was notable for myoglobinuria.  CK was ordered.  His current creatinine is 1.4.  On chart review, this is consistent with his creatinine from several months ago.  Interestingly, at that time, he presented in a very similar fashion.  During that prior visit, he was able to return to baseline and be discharged.  His CK was mildly elevated in the 200s.  Today, CK is 795.  Most likely cause of elevated CK is a recent seizure.  Of note, lactic acid in the ED was normal.  A 4-hour repeat CK was ordered.  On reassessment, patient stated that he felt fine.  He appeared to have a slight improvement in his rigidity and movements.  When attempting to stand, patient has significant truncal weakness.  This causes instability and patient was unable to walk.  Because of truncal weakness could be secondary to rhabdomyolysis.  Repeat CK continues to be elevated at 772.  Patient to be admitted for rhabdomyolysis and persistent weakness.  MRI brain was ordered to rule out any central process contributing to his acute functional decline.  Final Clinical Impression(s) / ED Diagnoses Final diagnoses:  Weakness    Rx / DC Orders ED Discharge Orders     None  Gloris Manchester, MD 04/26/21 1336

## 2021-04-26 ENCOUNTER — Encounter (HOSPITAL_COMMUNITY): Payer: Self-pay | Admitting: Internal Medicine

## 2021-04-26 ENCOUNTER — Observation Stay (HOSPITAL_COMMUNITY): Payer: Medicare Other

## 2021-04-26 DIAGNOSIS — R531 Weakness: Secondary | ICD-10-CM | POA: Diagnosis not present

## 2021-04-26 DIAGNOSIS — N183 Chronic kidney disease, stage 3 unspecified: Secondary | ICD-10-CM | POA: Diagnosis present

## 2021-04-26 MED ORDER — ORAL CARE MOUTH RINSE
15.0000 mL | Freq: Two times a day (BID) | OROMUCOSAL | Status: DC
  Administered 2021-04-26 – 2021-04-28 (×4): 15 mL via OROMUCOSAL

## 2021-04-26 MED ORDER — METOPROLOL TARTRATE 50 MG PO TABS
50.0000 mg | ORAL_TABLET | Freq: Every day | ORAL | Status: DC
  Administered 2021-04-26 – 2021-04-28 (×3): 50 mg via ORAL
  Filled 2021-04-26 (×3): qty 1

## 2021-04-26 MED ORDER — ENOXAPARIN SODIUM 30 MG/0.3ML IJ SOSY
30.0000 mg | PREFILLED_SYRINGE | INTRAMUSCULAR | Status: DC
  Administered 2021-04-26 – 2021-04-28 (×3): 30 mg via SUBCUTANEOUS
  Filled 2021-04-26 (×3): qty 0.3

## 2021-04-26 MED ORDER — SODIUM CHLORIDE 0.9 % IV SOLN: INTRAVENOUS | Status: AC

## 2021-04-26 MED ORDER — LEVETIRACETAM 500 MG PO TABS
500.0000 mg | ORAL_TABLET | Freq: Two times a day (BID) | ORAL | Status: DC
  Administered 2021-04-26 – 2021-04-28 (×6): 500 mg via ORAL
  Filled 2021-04-26 (×6): qty 1

## 2021-04-26 MED ORDER — ADULT MULTIVITAMIN W/MINERALS CH
1.0000 | ORAL_TABLET | Freq: Every day | ORAL | Status: DC
  Administered 2021-04-26 – 2021-04-28 (×3): 1 via ORAL
  Filled 2021-04-26 (×5): qty 1

## 2021-04-26 NOTE — Progress Notes (Signed)
PROGRESS NOTE    Carlos Shields  ERX:[id number] DOB: 07/14/1930 DOA: 04/25/2021 PCP: Corine Shelter, MD   Brief Narrative: Carlos Shields is a 87 y.o. male with a history of seizures, hypertension, CKD. Patient presented secondary to fall with resultant difficulty walking. Concern for possible seizure. Patient given Keppra loading dose followed by resumption of home regimen. PT ordered.   Assessment & Plan:   Principal Problem:   Weakness Active Problems:   Seizures (HCC)   HTN (hypertension)   CKD (chronic kidney disease), stage III (HCC)   Weakness generalized   Generalized weakness Unknown etiology but per daughter, this is acute. MRI brain and PT ordered on admission.  Seizure disorder Per daughter, last noticed seizure was about five years ago. No changes to medications. His daughter states he has had some rhinorrhea recently but thought it may be because of allergies. Concern for possible seizure secondary to tongue bite. Keppra 1000 mg IV loading dose given in the ED -EEG -Seizure precautions -Continue Keppra 500 mg BID  Primary hypertension -Continue metoprolol  CKD stage IIIa Stable  Chronic anemia Stable  Rhabdomyolysis Mild. No obvious symptoms. CK of 795 with associated positive hemoglobin/negative RBCs on urinalysis. Creatinine is stable. Given a 1 L NS bolus on admission. CK down to 772 today -CK in AM; if trending up -Continue IV fluids  DVT prophylaxis: Lovenox Code Status:   Code Status: Full Code Family Communication: Daughter on telephone Disposition Plan: Discharge pending continued workup for weakness and PT recommendations.    Consultants:  None  Procedures:  None  Antimicrobials: None    Subjective: No concerns today per patient.  Objective: Vitals:   04/25/21 2319 04/26/21 0013 04/26/21 0403 04/26/21 0854  BP: (!) 149/77 (!) 142/72 126/69 140/69  Pulse: 68  65 72  Resp: 18 16 16 17   Temp: 98.4 F (36.9 C) (!) 96.9 F (36.1  C) 97.8 F (36.6 C) 97.9 F (36.6 C)  TempSrc: Oral Oral Oral Oral  SpO2: 97% 100% 100% 100%  Weight:      Height:        Intake/Output Summary (Last 24 hours) at 04/26/2021 1140 Last data filed at 04/26/2021 0500 Gross per 24 hour  Intake 1262.44 ml  Output 0 ml  Net 1262.44 ml   Filed Weights   04/25/21 1653  Weight: 54.4 kg    Examination:  General exam: Appears calm and comfortable  Respiratory system: Clear to auscultation. Respiratory effort normal. Cardiovascular system: S1 & S2 heard, RRR. No murmurs, rubs, gallops or clicks. Gastrointestinal system: Abdomen is nondistended, soft and nontender. No organomegaly or masses felt. Normal bowel sounds heard. Central nervous system: Alert and oriented to self. No focal neurological deficits. 3/5 BLE strength Musculoskeletal: No edema. No calf tenderness Skin: No cyanosis. No rashes Psychiatry: Judgement and insight appear impaired. Blunt affect    Data Reviewed: I have personally reviewed following labs and imaging studies  CBC Lab Results  Component Value Date   WBC 8.6 04/25/2021   RBC 3.38 (L) 04/25/2021   HGB 10.5 (L) 04/25/2021   HCT 31.0 (L) 04/25/2021   MCV 90.2 04/25/2021   MCH 29.0 04/25/2021   PLT 201 04/25/2021   MCHC 32.1 04/25/2021   RDW 13.1 04/25/2021   LYMPHSABS 1.3 04/25/2021   MONOABS 0.9 04/25/2021   EOSABS 0.0 04/25/2021   BASOSABS 0.0 04/25/2021     Last metabolic panel Lab Results  Component Value Date   NA 137 04/25/2021   K 4.0 04/25/2021  CL 105 04/25/2021   CO2 21 (L) 04/25/2021   BUN 25 (H) 04/25/2021   CREATININE 1.40 (H) 04/25/2021   GLUCOSE 102 (H) 04/25/2021   GFRNONAA 47 (L) 04/25/2021   GFRAA 51 (L) 11/19/2019   CALCIUM 9.2 04/25/2021   PHOS 2.9 05/04/2013   PROT 8.0 04/25/2021   ALBUMIN 4.3 04/25/2021   BILITOT 1.7 (H) 04/25/2021   ALKPHOS 70 04/25/2021   AST 42 (H) 04/25/2021   ALT 18 04/25/2021   ANIONGAP 10 04/25/2021    CBG (last 3)  Recent Labs     04/25/21 1739  GLUCAP 101*     GFR: Estimated Creatinine Clearance: 28.1 mL/min (A) (by C-G formula based on SCr of 1.4 mg/dL (H)).  Coagulation Profile: Recent Labs  Lab 04/25/21 1655  INR 1.2    Recent Results (from the past 240 hour(s))  Resp Panel by RT-PCR (Flu A&B, Covid) Nasopharyngeal Swab     Status: None   Collection Time: 04/25/21  4:57 PM   Specimen: Nasopharyngeal Swab; Nasopharyngeal(NP) swabs in vial transport medium  Result Value Ref Range Status   SARS Coronavirus 2 by RT PCR NEGATIVE NEGATIVE Final    Comment: (NOTE) SARS-CoV-2 target nucleic acids are NOT DETECTED.  The SARS-CoV-2 RNA is generally detectable in upper respiratory specimens during the acute phase of infection. The lowest concentration of SARS-CoV-2 viral copies this assay can detect is 138 copies/mL. A negative result does not preclude SARS-Cov-2 infection and should not be used as the sole basis for treatment or other patient management decisions. A negative result may occur with  improper specimen collection/handling, submission of specimen other than nasopharyngeal swab, presence of viral mutation(s) within the areas targeted by this assay, and inadequate number of viral copies(<138 copies/mL). A negative result must be combined with clinical observations, patient history, and epidemiological information. The expected result is Negative.  Fact Sheet for Patients:  BloggerCourse.com  Fact Sheet for Healthcare Providers:  SeriousBroker.it  This test is no t yet approved or cleared by the [place] FDA and  has been authorized for detection and/or diagnosis of SARS-CoV-2 by FDA under an Emergency Use Authorization (EUA). This EUA will remain  in effect (meaning this test can be used) for the duration of the COVID-19 declaration under Section 564(b)(1) of the Act, 21 U.S.C.section 360bbb-3(b)(1), unless the authorization is  terminated  or revoked sooner.       Influenza A by PCR NEGATIVE NEGATIVE Final   Influenza B by PCR NEGATIVE NEGATIVE Final    Comment: (NOTE) The Xpert Xpress SARS-CoV-2/FLU/RSV plus assay is intended as an aid in the diagnosis of influenza from Nasopharyngeal swab specimens and should not be used as a sole basis for treatment. Nasal washings and aspirates are unacceptable for Xpert Xpress SARS-CoV-2/FLU/RSV testing.  Fact Sheet for Patients: BloggerCourse.com  Fact Sheet for Healthcare Providers: SeriousBroker.it  This test is not yet approved or cleared by the [place] FDA and has been authorized for detection and/or diagnosis of SARS-CoV-2 by FDA under an Emergency Use Authorization (EUA). This EUA will remain in effect (meaning this test can be used) for the duration of the COVID-19 declaration under Section 564(b)(1) of the Act, 21 U.S.C. section 360bbb-3(b)(1), unless the authorization is terminated or revoked.  Performed at [facility], [address] [place], [place] [address]         Radiology Studies: CT HEAD WO CONTRAST  Result Date: 04/25/2021 CLINICAL DATA:  Fall.  Mental status change, unknown cause  EXAM: CT HEAD WITHOUT CONTRAST TECHNIQUE: Contiguous axial images were obtained from the base of the skull through the vertex without intravenous contrast. COMPARISON:  05/03/2013 FINDINGS: Brain: There is atrophy and chronic small vessel disease changes. No acute intracranial abnormality. Specifically, no hemorrhage, hydrocephalus, mass lesion, acute infarction, or significant intracranial injury. Vascular: No hyperdense vessel or unexpected calcification. Skull: No acute calvarial abnormality. Sinuses/Orbits: Near complete opacification of the right maxillary sinus. Mucosal thickening in scattered ethmoid air cells. No air-fluid levels. Other: None IMPRESSION: Atrophy, chronic microvascular  disease. No acute intracranial abnormality. Electronically Signed   By: Charlett Nose M.D.   On: 04/25/2021 18:31   MR BRAIN WO CONTRAST  Result Date: 04/26/2021 CLINICAL DATA:  TIA EXAM: MRI HEAD WITHOUT CONTRAST TECHNIQUE: Multiplanar, multiecho pulse sequences of the brain and surrounding structures were obtained without intravenous contrast. COMPARISON:  01/14/2012 FINDINGS: Brain: No acute infarction, hemorrhage, hydrocephalus, extra-axial collection or mass lesion. Advanced brain atrophy, especially severe in the medial temporal lobes. Chronic small vessel ischemia that is confluent in the hemispheric white matter with chronic lacunar infarcts specifically seen at the bilateral thalamus and brainstem. Small remote left parietal cortex infarct. Vascular: Normal flow voids. Skull and upper cervical spine: Normal marrow signal. Sinuses/Orbits: Right maxillary sinusitis with complete opacification from peripheral mucosal thickening and central inspissated material, long-standing when compared to 2013. Interval clearance of the left maxillary sinus IMPRESSION: 1. No acute finding. 2. Advanced brain atrophy and chronic small vessel disease. Electronically Signed   By: Tiburcio Pea M.D.   On: 04/26/2021 10:35   DG Pelvis Portable  Result Date: 04/25/2021 CLINICAL DATA:  Fall EXAM: PORTABLE PELVIS 1-2 VIEWS COMPARISON:  None. FINDINGS: There is no evidence of pelvic fracture or diastasis. No pelvic bone lesions are seen. IMPRESSION: Negative. Electronically Signed   By: Charlett Nose M.D.   On: 04/25/2021 18:30   DG Chest Port 1 View  Result Date: 04/25/2021 CLINICAL DATA:  Altered mental status EXAM: PORTABLE CHEST 1 VIEW COMPARISON:  11/19/2019 FINDINGS: The heart size and mediastinal contours are within normal limits. Both lungs are clear. The visualized skeletal structures are unremarkable. IMPRESSION: No active disease. Electronically Signed   By: Charlett Nose M.D.   On: 04/25/2021 18:30         Scheduled Meds:  enoxaparin (LOVENOX) injection  30 mg Subcutaneous Q24H   levETIRAcetam  500 mg Oral BID   mouth rinse  15 mL Mouth Rinse BID   metoprolol tartrate  50 mg Oral Daily   multivitamin with minerals  1 tablet Oral Daily   Continuous Infusions:  sodium chloride 75 mL/hr at 04/26/21 0140     LOS: 0 days     Jacquelin Hawking, MD [facility] 04/26/2021, 11:40 AM  If 7PM-7AM, please contact night-coverage [email or web address]

## 2021-04-26 NOTE — Progress Notes (Signed)
PT Cancellation Note  Patient Details Name: Carlos Shields MRN: [id number] DOB: [birth year]   Cancelled Treatment:     PT order received but eval deferred this am - RN advises pt going to MRI shortly.  Will follow.   Isai Gottlieb 04/26/2021, 9:38 AM

## 2021-04-26 NOTE — H&P (Signed)
History and Physical    Carlos Shields WEX:[id number] DOB: 10-05-1930 DOA: 04/25/2021  PCP: Corine Shelter, MD  Patient coming from: Home.  Chief Complaint: Weakness.  HPI: Carlos Shields is a 87 y.o. male with history of seizures and hypertension was brought to the ER after patient was finding it difficult to ambulate after a fall.  As per the report patient had at least 3 falls in the last 24 hours.  Patient is a poor historian we will need to get further history from patient's daughter who brought the patient to the ER.  ED Course: In the ER patient appears nonfocal but is generally weak and unable to ambulate.  CT head is unremarkable.  Labs show creatinine 1.4 hemoglobin around 9 which is comparable to the one done in May 2021.  CK levels were elevated at around 700.  EKG was showing normal sinus rhythm.  COVID test was negative.  X-ray pelvis unremarkable.  Patient admitted for generalized weakness cause not clear.  On exam patient does have a tongue bite which may be that patient may have had a seizure.  Patient was given a Keppra loading dose in the ER.  Review of Systems: As per HPI, rest all negative.   Past Medical History:  Diagnosis Date   Hypertension    Pancreatitis    Seizures (HCC)     Past Surgical History:  Procedure Laterality Date   HERNIA REPAIR       reports that he has been smoking cigars. He has never used smokeless tobacco. He reports that he does not drink alcohol and does not use drugs.  No Known Allergies  Family History  Problem Relation Age of Onset   Hypertension Other    Diabetes Other    Asthma Other     Prior to Admission medications   Medication Sig Start Date End Date Taking? Authorizing Provider  levETIRAcetam (KEPPRA) 500 MG tablet Take 1 tablet (500 mg total) by mouth 2 (two) times daily. 05/04/13  Yes Buriev, Isaiah Serge, MD  metoprolol tartrate (LOPRESSOR) 50 MG tablet Take 50 mg by mouth daily. 10/11/16  Yes [provider]   multivitamin (ONE-A-DAY MEN'S) TABS tablet Take 1 tablet by mouth daily.   Yes [provider]  doxycycline (VIBRAMYCIN) 100 MG capsule Take 1 capsule (100 mg total) by mouth 2 (two) times daily. Patient not taking: Reported on 04/25/2021 03/05/21   Rodriguez-Southworth, Nettie Elm, PA-C  mupirocin nasal ointment (BACTROBAN) 2 % Apply in each nostril bid x 10 days Patient not taking: Reported on 04/25/2021 03/05/21   Rodriguez-Southworth, Nettie Elm, PA-C    Physical Exam: Constitutional: Moderately built and nourished. Vitals:   04/25/21 2230 04/25/21 2300 04/25/21 2319 04/26/21 0013  BP: 139/76 (!) 149/77 (!) 149/77 (!) 142/72  Pulse: 65 73 68   Resp: 16 16 18 16   Temp:   98.4 F (36.9 C) (!) 96.9 F (36.1 C)  TempSrc:   Oral Oral  SpO2: 99% 100% 97% 100%  Weight:      Height:       Eyes: Anicteric no pallor. ENMT: Tongue bite seen. Neck: No neck rigidity. Respiratory: No rhonchi or crepitations. Cardiovascular: S1-S2 heard. Abdomen: Soft nontender bowel sound present. Musculoskeletal: No edema. Skin: No rash. Neurologic: Alert awake oriented to his name.  Moving all extremities 5 x 5. Psychiatric: Oriented to name.   Labs on Admission: I have personally reviewed following labs and imaging studies  CBC: Recent Labs  Lab 04/25/21 1655 04/25/21 1722  WBC 8.6  --   NEUTROABS 6.4  --   HGB 9.8* 10.5*  HCT 30.5* 31.0*  MCV 90.2  --   PLT 201  --    Basic Metabolic Panel: Recent Labs  Lab 04/25/21 1655 04/25/21 1722  NA 134* 137  K 4.0 4.0  CL 103 105  CO2 21*  --   GLUCOSE 110* 102*  BUN 24* 25*  CREATININE 1.43* 1.40*  CALCIUM 9.2  --   MG 1.8  --    GFR: Estimated Creatinine Clearance: 28.1 mL/min (A) (by C-G formula based on SCr of 1.4 mg/dL (H)). Liver Function Tests: Recent Labs  Lab 04/25/21 1655  AST 42*  ALT 18  ALKPHOS 70  BILITOT 1.7*  PROT 8.0  ALBUMIN 4.3   No results for input(s): LIPASE, AMYLASE in the last 168 hours. No results  for input(s): AMMONIA in the last 168 hours. Coagulation Profile: Recent Labs  Lab 04/25/21 1655  INR 1.2   Cardiac Enzymes: Recent Labs  Lab 04/25/21 1655 04/25/21 2215  CKTOTAL 795* 772*   BNP (last 3 results) No results for input(s): PROBNP in the last 8760 hours. HbA1C: No results for input(s): HGBA1C in the last 72 hours. CBG: Recent Labs  Lab 04/25/21 1739  GLUCAP 101*   Lipid Profile: No results for input(s): CHOL, HDL, LDLCALC, TRIG, CHOLHDL, LDLDIRECT in the last 72 hours. Thyroid Function Tests: No results for input(s): TSH, T4TOTAL, FREET4, T3FREE, THYROIDAB in the last 72 hours. Anemia Panel: No results for input(s): VITAMINB12, FOLATE, FERRITIN, TIBC, IRON, RETICCTPCT in the last 72 hours. Urine analysis:    Component Value Date/Time   COLORURINE YELLOW 04/25/2021 1655   APPEARANCEUR CLEAR 04/25/2021 1655   LABSPEC 1.013 04/25/2021 1655   PHURINE 6.0 04/25/2021 1655   GLUCOSEU NEGATIVE 04/25/2021 1655   HGBUR MODERATE (A) 04/25/2021 1655   BILIRUBINUR NEGATIVE 04/25/2021 1655   KETONESUR 5 (A) 04/25/2021 1655   PROTEINUR 30 (A) 04/25/2021 1655   UROBILINOGEN 0.2 05/04/2013 0337   NITRITE NEGATIVE 04/25/2021 1655   LEUKOCYTESUR NEGATIVE 04/25/2021 1655   Sepsis Labs: @LABRCNTIP (procalcitonin:4,lacticidven:4) ) Recent Results (from the past 240 hour(s))  Resp Panel by RT-PCR (Flu A&B, Covid) Nasopharyngeal Swab     Status: None   Collection Time: 04/25/21  4:57 PM   Specimen: Nasopharyngeal Swab; Nasopharyngeal(NP) swabs in vial transport medium  Result Value Ref Range Status   SARS Coronavirus 2 by RT PCR NEGATIVE NEGATIVE Final    Comment: (NOTE) SARS-CoV-2 target nucleic acids are NOT DETECTED.  The SARS-CoV-2 RNA is generally detectable in upper respiratory specimens during the acute phase of infection. The lowest concentration of SARS-CoV-2 viral copies this assay can detect is 138 copies/mL. A negative result does not preclude  SARS-Cov-2 infection and should not be used as the sole basis for treatment or other patient management decisions. A negative result may occur with  improper specimen collection/handling, submission of specimen other than nasopharyngeal swab, presence of viral mutation(s) within the areas targeted by this assay, and inadequate number of viral copies(<138 copies/mL). A negative result must be combined with clinical observations, patient history, and epidemiological information. The expected result is Negative.  Fact Sheet for Patients:  04/27/21  Fact Sheet for Healthcare Providers:  BloggerCourse.com  This test is no t yet approved or cleared by the [place] FDA and  has been authorized for detection and/or diagnosis of SARS-CoV-2 by FDA under an Emergency Use Authorization (EUA). This EUA will remain  in effect (meaning  this test can be used) for the duration of the COVID-19 declaration under Section 564(b)(1) of the Act, 21 U.S.C.section 360bbb-3(b)(1), unless the authorization is terminated  or revoked sooner.       Influenza A by PCR NEGATIVE NEGATIVE Final   Influenza B by PCR NEGATIVE NEGATIVE Final    Comment: (NOTE) The Xpert Xpress SARS-CoV-2/FLU/RSV plus assay is intended as an aid in the diagnosis of influenza from Nasopharyngeal swab specimens and should not be used as a sole basis for treatment. Nasal washings and aspirates are unacceptable for Xpert Xpress SARS-CoV-2/FLU/RSV testing.  Fact Sheet for Patients: BloggerCourse.com  Fact Sheet for Healthcare Providers: SeriousBroker.it  This test is not yet approved or cleared by the [place] FDA and has been authorized for detection and/or diagnosis of SARS-CoV-2 by FDA under an Emergency Use Authorization (EUA). This EUA will remain in effect (meaning this test can be used) for the duration of  the COVID-19 declaration under Section 564(b)(1) of the Act, 21 U.S.C. section 360bbb-3(b)(1), unless the authorization is terminated or revoked.  Performed at [facility], [address], [place], [place] [address]      Radiological Exams on Admission: CT HEAD WO CONTRAST  Result Date: 04/25/2021 CLINICAL DATA:  Fall.  Mental status change, unknown cause EXAM: CT HEAD WITHOUT CONTRAST TECHNIQUE: Contiguous axial images were obtained from the base of the skull through the vertex without intravenous contrast. COMPARISON:  05/03/2013 FINDINGS: Brain: There is atrophy and chronic small vessel disease changes. No acute intracranial abnormality. Specifically, no hemorrhage, hydrocephalus, mass lesion, acute infarction, or significant intracranial injury. Vascular: No hyperdense vessel or unexpected calcification. Skull: No acute calvarial abnormality. Sinuses/Orbits: Near complete opacification of the right maxillary sinus. Mucosal thickening in scattered ethmoid air cells. No air-fluid levels. Other: None IMPRESSION: Atrophy, chronic microvascular disease. No acute intracranial abnormality. Electronically Signed   By: Charlett Nose M.D.   On: 04/25/2021 18:31   DG Pelvis Portable  Result Date: 04/25/2021 CLINICAL DATA:  Fall EXAM: PORTABLE PELVIS 1-2 VIEWS COMPARISON:  None. FINDINGS: There is no evidence of pelvic fracture or diastasis. No pelvic bone lesions are seen. IMPRESSION: Negative. Electronically Signed   By: Charlett Nose M.D.   On: 04/25/2021 18:30   DG Chest Port 1 View  Result Date: 04/25/2021 CLINICAL DATA:  Altered mental status EXAM: PORTABLE CHEST 1 VIEW COMPARISON:  11/19/2019 FINDINGS: The heart size and mediastinal contours are within normal limits. Both lungs are clear. The visualized skeletal structures are unremarkable. IMPRESSION: No active disease. Electronically Signed   By: Charlett Nose M.D.   On: 04/25/2021 18:30    EKG: Independently reviewed.   Normal sinus rhythm.  Assessment/Plan Principal Problem:   Weakness Active Problems:   Seizures (HCC)   HTN (hypertension)   CKD (chronic kidney disease), stage III (HCC)   Weakness generalized    Generalized weakness cause not clear.  MRI brain has been ordered.  Get physical therapy consult.  Patient appears nonfocal. History of seizures may have had another seizure.  On exam patient does have a tongue bite.  Patient was given Keppra loading dose.  Will need to discuss with neurologist about if further dose of Keppra needs to be increased. Hypertension on beta-blockers. Chronically disease stage III creatinine appears to be at baseline when compared to the one in May 2021. Chronic anemia follow CBC check anemia panel.  Addendum -discussed with patient's daughter.  Patient's daughter stated that patient had at least 2 falls yesterday and  was finding it difficult to get up after the fall and that is the reason patient was brought to the ER.   DVT prophylaxis: Lovenox. Code Status: Full code. Family Communication: Patient's daughter. Disposition Plan: Home. Consults called: We will discussed with neurologist and get physical therapy consult. Admission status: Observation.   Eduard Clos MD [facility] Pager [phone].  If 7PM-7AM, please contact night-coverage [email or web address] Password [email or web address]  04/26/2021, 1:10 AM

## 2021-04-27 ENCOUNTER — Observation Stay (HOSPITAL_COMMUNITY)
Admit: 2021-04-27 | Discharge: 2021-04-27 | Disposition: A | Discharge status: Home / Self Care | Payer: Medicare Other | Attending: Family Medicine | Admitting: Family Medicine

## 2021-04-27 DIAGNOSIS — R569 Unspecified convulsions: Secondary | ICD-10-CM | POA: Diagnosis not present

## 2021-04-27 DIAGNOSIS — R531 Weakness: Secondary | ICD-10-CM | POA: Diagnosis not present

## 2021-04-27 LAB — CK: Total CK: 655 U/L — ABNORMAL HIGH (ref 49–397)

## 2021-04-27 NOTE — Evaluation (Signed)
Physical Therapy Evaluation Patient Details Name: Carlos Shields MRN: [id number] DOB: 1931/02/01 Today's Date: 04/27/2021  History of Present Illness     Clinical Impression  Pt is an 87 y.o. male with below HPI resulting in the deficits listed below (see PT Problem List). Pt reports that he is independent with dressing, bathing, and toileting and ambulates household distances with RW. Pt is a questionable historian and denies history of falls multiple times throughout session, but per EMR pt has presented to hospital for falls/difficulty ambulating. Pt performed bed mobility, transfers, and ambulation ~34ft with MIN A for stability/safety and cues for RW management. Pt states that he lives with 2 grandsons and has 24/7 supervision between two of them, but pt is questionable historian and no family present to verify PLOF/home environment. Per LCSW note on 04/27/21, pt will not have 24/7 supervision with prior home living so daughter plans to take him to her home at d/c. Recommend 24/7 supervision, HHPT, and RW (if pt does not own). Pt will benefit from skilled PT to maximize functional mobility to increase independence.         Recommendations for follow up therapy are one component of a multi-disciplinary discharge planning process, led by the attending physician.  Recommendations may be updated based on patient status, additional functional criteria and insurance authorization.  Follow Up Recommendations        04/27/21 1300  PT Visit Information  Last PT Received On 04/27/21  Assistance Needed +1  History of Present Illness Pt is a 87 y.o. male with a history of seizures, hypertension, CKD. Patient presented secondary to fall with resultant difficulty walking. Concern for possible seizure. PMH significant for HTN and seizures.  Precautions  Precautions Fall  Restrictions  Weight Bearing Restrictions No  Home Living  Family/patient expects to be discharged to: Private residence  Living  Arrangements Other relatives  Available Help at Discharge Family  Type of Home House  Home Access Stairs to enter  Entrance Stairs-Number of Steps  (pt unable to recall how many)  Entrance Stairs-Rails Can reach both  Home Layout One level  Bathroom Optician, dispensing - 2 wheels;Shower seat;Cane - single point;Cane - quad;BSC  Additional Comments Pt reports he lives with his 2 grandson's and has 24/7 supervision. Daughter lives close by. Questionable historian, no family present to verify PLOF.  Prior Function  Level of Independence Independent with assistive device(s)  Comments use of RW. Pt reports he is independent with bathing, dressing, daughter helps with meals. Ambulates household distances. Questionable historian  Communication  Communication No difficulties  Pain Assessment  Pain Assessment No/denies pain  Cognition  Arousal/Alertness Awake/alert  Behavior During Therapy WFL for tasks assessed/performed  Overall Cognitive Status No family/caregiver present to determine baseline cognitive functioning  General Comments oriented to self, month and place, replied "25" when asked the current yr. denies history of falls, per EMR pt had fall at home.  Upper Extremity Assessment  Upper Extremity Assessment Overall WFL for tasks assessed  Lower Extremity Assessment  Lower Extremity Assessment RLE deficits/detail;LLE deficits/detail  RLE Deficits / Details grossly 4/5 throughout  LLE Deficits / Details grossly 4/5 throughout  Cervical / Trunk Assessment  Cervical / Trunk Assessment Kyphotic  Bed Mobility  Overal bed mobility Needs Assistance  Bed Mobility Supine to Sit  Supine to sit Min assist;HOB elevated  General bed mobility comments MIN A to bring trunk to upright, pt with  use of bed rails and requiring increased time.  Transfers  Overall transfer level Needs assistance  Equipment used  Rolling walker (2 wheeled)  Transfers Sit to/from Stand  Sit to Stand Min assist  General transfer comment MIN A for power up to rise and for steadying with cues for safe hand placement with use of RW.  Ambulation/Gait  Ambulation/Gait assistance Min assist  Gait Distance (Feet) 90 Feet  Assistive device Rolling walker (2 wheeled)  Gait Pattern/deviations Step-through pattern;Decreased stride length;Drifts right/left  General Gait Details MIN A for stability with cues to maintain close proximity to RW to maximize safety.  Pt with intermittent drifting L/R  Gait velocity decr  Balance  Overall balance assessment Needs assistance  Sitting-balance support Feet supported  Sitting balance-Leahy Scale Good  Standing balance support Bilateral upper extremity supported;During functional activity  Standing balance-Leahy Scale Poor  Standing balance comment use of RW  PT - End of Session  Equipment Utilized During Treatment Gait belt  Activity Tolerance Patient tolerated treatment well  Patient left in chair;with call bell/phone within reach;with chair alarm set  PT Assessment  PT Recommendation/Assessment Patient needs continued PT services  PT Visit Diagnosis Unsteadiness on feet (R26.81);History of falling (Z91.81)  PT Problem List Decreased strength;Decreased activity tolerance;Decreased balance;Decreased mobility;Decreased knowledge of use of DME  PT Plan  PT Frequency (ACUTE ONLY) Min 3X/week  PT Treatment/Interventions (ACUTE ONLY) DME instruction;Gait training;Stair training;Functional mobility training;Therapeutic activities;Therapeutic exercise;Balance training;Patient/family education  AM-PAC PT "6 Clicks" Mobility Outcome Measure (Version 2)  Help needed turning from your back to your side while in a flat bed without using bedrails? 4  Help needed moving from lying on your back to sitting on the side of a flat bed without using bedrails? 3  Help needed moving to and from a bed to a  chair (including a wheelchair)? 3  Help needed standing up from a chair using your arms (e.g., wheelchair or bedside chair)? 3  Help needed to walk in hospital room? 3  Help needed climbing 3-5 steps with a railing?  2  6 Click Score 18  Consider Recommendation of Discharge To: Home with [facility]  Progressive Mobility  What is the highest level of mobility based on the progressive mobility assessment? Level 5 (Walks with assist in room/hall) - Balance while stepping forward/back and can walk in room with assist - Complete  Mobility Ambulated with assistance in hallway  PT Recommendation  Follow Up Recommendations Home health PT;Supervision for mobility/OOB;Supervision/Assistance - 24 hour  PT equipment  (if pt does not own RW, recommend RW)  Individuals Consulted  Consulted and Agree with Results and Recommendations Patient  Acute Rehab PT Goals  Patient Stated Goal Move like before when was at home  PT Goal Formulation With patient  Time For Goal Achievement 05/11/21  Potential to Achieve Goals Good  PT Time Calculation  PT Start Time (ACUTE ONLY) 1347  PT Stop Time (ACUTE ONLY) 1420  PT Time Calculation (min) (ACUTE ONLY) 33 min  PT General Charges  $$ ACUTE PT VISIT 1 Visit  PT Evaluation  $PT Eval Low Complexity 1 Low  PT Treatments  $Therapeutic Activity 8-22 mins       Lyman Speller PT, DPT  Acute Rehabilitation Services  Office [phone]  04/27/2021, 5:04 PM

## 2021-04-27 NOTE — Procedures (Addendum)
Patient Name: Carlos Shields  MRN: [id number]  Epilepsy Attending: Charlsie Quest  Referring Physician/Provider: Dr Jacquelin Hawking Date: 04/27/2021 Duration: 21.56 mins  Patient history: 87 year old male with history of epilepsy who presented after an episode of fall and was noted to have a tongue bite.  EEG evaluate for seizure.  Level of alertness: Awake  AEDs during EEG study: LEV  Technical aspects: This EEG study was done with scalp electrodes positioned according to the 10-20 International system of electrode placement. Electrical activity was acquired at a sampling rate of 500Hz  and reviewed with a high frequency filter of 70Hz  and a low frequency filter of 1Hz . EEG data were recorded continuously and digitally stored.   Description: The posterior dominant rhythm consists of 9 Hz activity of moderate voltage (25-35 uV) seen predominantly in posterior head regions, symmetric and reactive to eye opening and eye closing. Hyperventilation and photic stimulation were not performed.     IMPRESSION: This study is within normal limits. No seizures or epileptiform discharges were seen throughout the recording.  Carlos Shields 

## 2021-04-27 NOTE — Progress Notes (Signed)
PROGRESS NOTE    Carlos Shields  DIY:[id number] DOB: 26-Sep-1930 DOA: 04/25/2021 PCP: Corine Shelter, MD   Brief Narrative: Carlos Shields is a 87 y.o. male with a history of seizures, hypertension, CKD. Patient presented secondary to fall with resultant difficulty walking. Concern for possible seizure. Patient given Keppra loading dose followed by resumption of home regimen. PT ordered.   Assessment & Plan:   Principal Problem:   Weakness Active Problems:   Seizures (HCC)   HTN (hypertension)   CKD (chronic kidney disease), stage III (HCC)   Weakness generalized   Generalized weakness Unknown etiology but per daughter, this is acute. MRI brain and PT ordered on admission. MRI without acute process -Follow-up PT recommendations  Seizure disorder Per daughter, last noticed seizure was about five years ago. No changes to medications. His daughter states he has had some rhinorrhea recently but thought it may be because of allergies. Concern for possible seizure secondary to tongue bite. Keppra 1000 mg IV loading dose given in the ED -EEG pending -Seizure precautions -Continue Keppra 500 mg BID  Primary hypertension -Continue metoprolol  CKD stage IIIa Stable  Chronic anemia Stable  Rhabdomyolysis Mild. No obvious symptoms. CK of 795 with associated positive hemoglobin/negative RBCs on urinalysis. Creatinine is stable. Given a 1 L NS bolus on admission. CK down to 655 today -CK in AM -Continue IV fluids for now  DVT prophylaxis: Lovenox Code Status:   Code Status: Full Code Family Communication: Daughter at bedside Disposition Plan: Discharge pending PT recommendations.    Consultants:  None  Procedures:  None  Antimicrobials: None    Subjective: No issues overnight.  Objective: Vitals:   04/26/21 1416 04/26/21 2023 04/27/21 0428 04/27/21 1003  BP: 117/67 (!) 148/66 (!) 146/71 (!) 145/73  Pulse: 60 65 (!) 58 68  Resp: 16 16 15    Temp: 97.8 F (36.6 C)  98.8 F (37.1 C) 97.7 F (36.5 C)   TempSrc: Oral Oral Oral   SpO2: 100% 96% 100%   Weight:      Height:        Intake/Output Summary (Last 24 hours) at 04/27/2021 1154 Last data filed at 04/27/2021 0618 Gross per 24 hour  Intake 731.91 ml  Output 300 ml  Net 431.91 ml    Filed Weights   04/25/21 1653  Weight: 54.4 kg    Examination:  General exam: Appears calm and comfortable Respiratory system: Clear to auscultation. Respiratory effort normal. Cardiovascular system: S1 & S2 heard, RRR. No murmurs, rubs, gallops or clicks. Gastrointestinal system: Abdomen is nondistended, soft and nontender. No organomegaly or masses felt. Normal bowel sounds heard. Central nervous system: Alert and oriented to person, place and month. No focal neurological deficits. Musculoskeletal: No edema. No calf tenderness Skin: No cyanosis. No rashes Psychiatry: Judgement and insight appear impaired. Mood & affect appropriate.    Data Reviewed: I have personally reviewed following labs and imaging studies  CBC Lab Results  Component Value Date   WBC 8.6 04/25/2021   RBC 3.38 (L) 04/25/2021   HGB 10.5 (L) 04/25/2021   HCT 31.0 (L) 04/25/2021   MCV 90.2 04/25/2021   MCH 29.0 04/25/2021   PLT 201 04/25/2021   MCHC 32.1 04/25/2021   RDW 13.1 04/25/2021   LYMPHSABS 1.3 04/25/2021   MONOABS 0.9 04/25/2021   EOSABS 0.0 04/25/2021   BASOSABS 0.0 04/25/2021     Last metabolic panel Lab Results  Component Value Date   NA 137 04/25/2021   K 4.0 04/25/2021  CL 105 04/25/2021   CO2 21 (L) 04/25/2021   BUN 25 (H) 04/25/2021   CREATININE 1.40 (H) 04/25/2021   GLUCOSE 102 (H) 04/25/2021   GFRNONAA 47 (L) 04/25/2021   GFRAA 51 (L) 11/19/2019   CALCIUM 9.2 04/25/2021   PHOS 2.9 05/04/2013   PROT 8.0 04/25/2021   ALBUMIN 4.3 04/25/2021   BILITOT 1.7 (H) 04/25/2021   ALKPHOS 70 04/25/2021   AST 42 (H) 04/25/2021   ALT 18 04/25/2021   ANIONGAP 10 04/25/2021    CBG (last 3)  Recent  Labs    04/25/21 1739  GLUCAP 101*      GFR: Estimated Creatinine Clearance: 28.1 mL/min (A) (by C-G formula based on SCr of 1.4 mg/dL (H)).  Coagulation Profile: Recent Labs  Lab 04/25/21 1655  INR 1.2     Recent Results (from the past 240 hour(s))  Resp Panel by RT-PCR (Flu A&B, Covid) Nasopharyngeal Swab     Status: None   Collection Time: 04/25/21  4:57 PM   Specimen: Nasopharyngeal Swab; Nasopharyngeal(NP) swabs in vial transport medium  Result Value Ref Range Status   SARS Coronavirus 2 by RT PCR NEGATIVE NEGATIVE Final    Comment: (NOTE) SARS-CoV-2 target nucleic acids are NOT DETECTED.  The SARS-CoV-2 RNA is generally detectable in upper respiratory specimens during the acute phase of infection. The lowest concentration of SARS-CoV-2 viral copies this assay can detect is 138 copies/mL. A negative result does not preclude SARS-Cov-2 infection and should not be used as the sole basis for treatment or other patient management decisions. A negative result may occur with  improper specimen collection/handling, submission of specimen other than nasopharyngeal swab, presence of viral mutation(s) within the areas targeted by this assay, and inadequate number of viral copies(<138 copies/mL). A negative result must be combined with clinical observations, patient history, and epidemiological information. The expected result is Negative.  Fact Sheet for Patients:  BloggerCourse.com  Fact Sheet for Healthcare Providers:  SeriousBroker.it  This test is no t yet approved or cleared by the [place] FDA and  has been authorized for detection and/or diagnosis of SARS-CoV-2 by FDA under an Emergency Use Authorization (EUA). This EUA will remain  in effect (meaning this test can be used) for the duration of the COVID-19 declaration under Section 564(b)(1) of the Act, 21 U.S.C.section 360bbb-3(b)(1), unless the authorization  is terminated  or revoked sooner.       Influenza A by PCR NEGATIVE NEGATIVE Final   Influenza B by PCR NEGATIVE NEGATIVE Final    Comment: (NOTE) The Xpert Xpress SARS-CoV-2/FLU/RSV plus assay is intended as an aid in the diagnosis of influenza from Nasopharyngeal swab specimens and should not be used as a sole basis for treatment. Nasal washings and aspirates are unacceptable for Xpert Xpress SARS-CoV-2/FLU/RSV testing.  Fact Sheet for Patients: BloggerCourse.com  Fact Sheet for Healthcare Providers: SeriousBroker.it  This test is not yet approved or cleared by the [place] FDA and has been authorized for detection and/or diagnosis of SARS-CoV-2 by FDA under an Emergency Use Authorization (EUA). This EUA will remain in effect (meaning this test can be used) for the duration of the COVID-19 declaration under Section 564(b)(1) of the Act, 21 U.S.C. section 360bbb-3(b)(1), unless the authorization is terminated or revoked.  Performed at [facility] [address], [place], [place] [address]          Radiology Studies: CT HEAD WO CONTRAST  Result Date: 04/25/2021 CLINICAL DATA:  Fall.  Mental status  change, unknown cause EXAM: CT HEAD WITHOUT CONTRAST TECHNIQUE: Contiguous axial images were obtained from the base of the skull through the vertex without intravenous contrast. COMPARISON:  05/03/2013 FINDINGS: Brain: There is atrophy and chronic small vessel disease changes. No acute intracranial abnormality. Specifically, no hemorrhage, hydrocephalus, mass lesion, acute infarction, or significant intracranial injury. Vascular: No hyperdense vessel or unexpected calcification. Skull: No acute calvarial abnormality. Sinuses/Orbits: Near complete opacification of the right maxillary sinus. Mucosal thickening in scattered ethmoid air cells. No air-fluid levels. Other: None IMPRESSION: Atrophy, chronic  microvascular disease. No acute intracranial abnormality. Electronically Signed   By: Charlett Nose M.D.   On: 04/25/2021 18:31   MR BRAIN WO CONTRAST  Result Date: 04/26/2021 CLINICAL DATA:  TIA EXAM: MRI HEAD WITHOUT CONTRAST TECHNIQUE: Multiplanar, multiecho pulse sequences of the brain and surrounding structures were obtained without intravenous contrast. COMPARISON:  01/14/2012 FINDINGS: Brain: No acute infarction, hemorrhage, hydrocephalus, extra-axial collection or mass lesion. Advanced brain atrophy, especially severe in the medial temporal lobes. Chronic small vessel ischemia that is confluent in the hemispheric white matter with chronic lacunar infarcts specifically seen at the bilateral thalamus and brainstem. Small remote left parietal cortex infarct. Vascular: Normal flow voids. Skull and upper cervical spine: Normal marrow signal. Sinuses/Orbits: Right maxillary sinusitis with complete opacification from peripheral mucosal thickening and central inspissated material, long-standing when compared to 2013. Interval clearance of the left maxillary sinus IMPRESSION: 1. No acute finding. 2. Advanced brain atrophy and chronic small vessel disease. Electronically Signed   By: Tiburcio Pea M.D.   On: 04/26/2021 10:35   DG Pelvis Portable  Result Date: 04/25/2021 CLINICAL DATA:  Fall EXAM: PORTABLE PELVIS 1-2 VIEWS COMPARISON:  None. FINDINGS: There is no evidence of pelvic fracture or diastasis. No pelvic bone lesions are seen. IMPRESSION: Negative. Electronically Signed   By: Charlett Nose M.D.   On: 04/25/2021 18:30   DG Chest Port 1 View  Result Date: 04/25/2021 CLINICAL DATA:  Altered mental status EXAM: PORTABLE CHEST 1 VIEW COMPARISON:  11/19/2019 FINDINGS: The heart size and mediastinal contours are within normal limits. Both lungs are clear. The visualized skeletal structures are unremarkable. IMPRESSION: No active disease. Electronically Signed   By: Charlett Nose M.D.   On: 04/25/2021  18:30        Scheduled Meds:  enoxaparin (LOVENOX) injection  30 mg Subcutaneous Q24H   levETIRAcetam  500 mg Oral BID   mouth rinse  15 mL Mouth Rinse BID   metoprolol tartrate  50 mg Oral Daily   multivitamin with minerals  1 tablet Oral Daily   Continuous Infusions:     LOS: 0 days     Jacquelin Hawking, MD [facility] 04/27/2021, 11:54 AM  If 7PM-7AM, please contact night-coverage [email or web address]

## 2021-04-27 NOTE — Progress Notes (Signed)
EEG complete - results pending 

## 2021-04-27 NOTE — TOC Initial Note (Signed)
Transition of Care [facility]) - Initial/Assessment Note    Patient Details  Name: Carlos Shields MRN: [id number] Date of Birth: 30-Oct-1930  [facility]) CM/SW Contact:    Ida Rogue, LCSW Phone Number: 04/27/2021, 3:11 PM  Clinical Narrative:  Called daughter in follow up to PT recommendation of HH PT, rolling walker.  Ms Marlana Latus states her father has been staying with his nephew, but he is gone much of the time with no one else there for supervision, so she plans to take him to her home at d/c.  She would like HH PT services, as well as walker.   Will need to find [facility] provider. TOC will continue to follow during the course of hospitalization.                Expected Discharge Plan: Home w Home Health Services Barriers to Discharge: No Barriers Identified   Patient Goals and CMS Choice     Choice offered to / list presented to : Adult Children  Expected Discharge Plan and Services Expected Discharge Plan: Home w Home Health Services   Discharge Planning Services: CM Consult Post Acute Care Choice: Durable Medical Equipment, Home Health Living arrangements for the past 2 months: Single Family Home                                      Prior Living Arrangements/Services Living arrangements for the past 2 months: Single Family Home Lives with:: Relatives Patient language and need for interpreter reviewed:: Yes        Need for Family Participation in Patient Care: Yes (Comment) Care giver support system in place?: Yes (comment)   Criminal Activity/Legal Involvement Pertinent to Current Situation/Hospitalization: No - Comment as needed  Activities of Daily Living Home Assistive Devices/Equipment: Transfer belt, Walker (specify type) ADL Screening (condition at time of admission) Patient's cognitive ability adequate to safely complete daily activities?: No Is the patient deaf or have difficulty hearing?: No Does the patient have difficulty seeing, even when wearing  glasses/contacts?: Yes Does the patient have difficulty concentrating, remembering, or making decisions?: Yes Patient able to express need for assistance with ADLs?: Yes Does the patient have difficulty dressing or bathing?: Yes Independently performs ADLs?: No Communication: Independent Dressing (OT): Needs assistance Is this a change from baseline?: Pre-admission baseline Grooming: Needs assistance Is this a change from baseline?: Pre-admission baseline Feeding: Needs assistance Is this a change from baseline?: Pre-admission baseline Bathing: Dependent Is this a change from baseline?: Pre-admission baseline Toileting: Needs assistance Is this a change from baseline?: Pre-admission baseline In/Out Bed: Dependent Is this a change from baseline?: Pre-admission baseline Walks in Home: Dependent Is this a change from baseline?: Pre-admission baseline Does the patient have difficulty walking or climbing stairs?: Yes Weakness of Legs: Both Weakness of Arms/Hands: Both  Permission Sought/Granted Permission sought to share information with : Family Supports Permission granted to share information with : No  Share Information with NAME: Odem,[name] (Daughter)   [phone]           Emotional Assessment Appearance:: Appears stated age Attitude/Demeanor/Rapport: Engaged Affect (typically observed): Appropriate Orientation: : Oriented to Self, Oriented to Situation Alcohol / Substance Use: Not Applicable Psych Involvement: No (comment)  Admission diagnosis:  Weakness [R53.1] Weakness generalized [R53.1] Patient Active Problem List   Diagnosis Date Noted   CKD (chronic kidney disease), stage III (HCC) 04/26/2021   Weakness generalized 04/26/2021  Weakness 04/25/2021   Syncope 01/13/2012   Anemia 12/09/2011   Seizures (HCC) 12/08/2011   HTN (hypertension) 12/08/2011   Encephalopathy 12/08/2011   PCP:  Corine Shelter, MD Pharmacy:   [facility] [phone] [address], [place]  - [phone] AT [place] [address] [place] [address] Phone: [phone] Fax: [phone]     Social Determinants of Health (SDOH) Interventions    Readmission Risk Interventions No flowsheet data found.

## 2021-04-28 DIAGNOSIS — R531 Weakness: Secondary | ICD-10-CM | POA: Diagnosis not present

## 2021-04-28 LAB — BASIC METABOLIC PANEL
Anion gap: 8 (ref 5–15)
BUN: 18 mg/dL (ref 8–23)
CO2: 20 mmol/L — ABNORMAL LOW (ref 22–32)
Calcium: 8.8 mg/dL — ABNORMAL LOW (ref 8.9–10.3)
Chloride: 105 mmol/L (ref 98–111)
Creatinine, Ser: 1.22 mg/dL (ref 0.61–1.24)
GFR, Estimated: 57 mL/min — ABNORMAL LOW (ref >60)
Glucose, Bld: 99 mg/dL (ref 70–99)
Potassium: 4.4 mmol/L (ref 3.5–5.1)
Sodium: 133 mmol/L — ABNORMAL LOW (ref 135–145)

## 2021-04-28 LAB — CBC
HCT: 31.2 % — ABNORMAL LOW (ref 39.0–52.0)
Hemoglobin: 10 g/dL — ABNORMAL LOW (ref 13.0–17.0)
MCH: 29.2 pg (ref 26.0–34.0)
MCHC: 32.1 g/dL (ref 30.0–36.0)
MCV: 91 fL (ref 80.0–100.0)
Platelets: 182 10*3/uL (ref 150–400)
RBC: 3.43 MIL/uL — ABNORMAL LOW (ref 4.22–5.81)
RDW: 12.9 % (ref 11.5–15.5)
WBC: 5 10*3/uL (ref 4.0–10.5)
nRBC: 0 % (ref 0.0–0.2)

## 2021-04-28 LAB — CK: Total CK: 800 U/L — ABNORMAL HIGH (ref 49–397)

## 2021-04-28 MED ORDER — SODIUM CHLORIDE 0.9 % IV BOLUS
500.0000 mL | Freq: Once | INTRAVENOUS | Status: AC
  Administered 2021-04-28: 500 mL via INTRAVENOUS

## 2021-04-28 NOTE — Discharge Summary (Signed)
Physician Discharge Summary  Carlos Shields ZOX:[id number] DOB: 02/25/1931 DOA: 04/25/2021  PCP: Corine Shelter, MD  Admit date: 04/25/2021 Discharge date: 04/28/2021  Admitted From: Home Disposition: Home  Recommendations for Outpatient Follow-up:  Follow up with PCP in 1 week Please follow up on the following pending results: None  Home Health: PT, Home aide Equipment/Devices: Rolling walker  Discharge Condition: Stable CODE STATUS: Full code Diet recommendation: Regular diet   Brief/Interim Summary:  Admission HPI written by Eduard Clos, MD   HPI: Carlos Shields is a 87 y.o. male with history of seizures and hypertension was brought to the ER after patient was finding it difficult to ambulate after a fall.  As per the report patient had at least 3 falls in the last 24 hours.  Patient is a poor historian we will need to get further history from patient's daughter who brought the patient to the ER.   Hospital course:  Generalized weakness Unknown etiology but per daughter, this is acute. Possibly related to dehydration. MRI brain and PT ordered on admission. MRI without acute process. PT recommending home health and a rolling walker.   Seizure disorder Per daughter, last noticed seizure was about five years ago. No changes to medications. His daughter states he has had some rhinorrhea recently but thought it may be because of allergies. Concern for possible seizure secondary to tongue bite. Keppra 1000 mg IV loading dose given in the ED. EEG normal and without evidence of seizure activity. Continue home Keppra.   Primary hypertension Continue metoprolol   CKD stage IIIa Stable   Chronic anemia Stable   Rhabdomyolysis Mild. No obvious symptoms. CK of 795 with associated positive hemoglobin/negative RBCs on urinalysis. Creatinine is stable. Given a 1 L NS bolus on admission. CK stabilized but no associated AKI.   Discharge Diagnoses:  Principal Problem:    Weakness Active Problems:   Seizures (HCC)   HTN (hypertension)   CKD (chronic kidney disease), stage III (HCC)   Weakness generalized    Discharge Instructions   Allergies as of 04/28/2021   No Known Allergies      Medication List     STOP taking these medications    doxycycline 100 MG capsule Commonly known as: VIBRAMYCIN   mupirocin nasal ointment 2 % Commonly known as: BACTROBAN       TAKE these medications    levETIRAcetam 500 MG tablet Commonly known as: KEPPRA Take 1 tablet (500 mg total) by mouth 2 (two) times daily.   metoprolol tartrate 50 MG tablet Commonly known as: LOPRESSOR Take 50 mg by mouth daily.   multivitamin Tabs tablet Take 1 tablet by mouth daily.               Durable Medical Equipment  (From admission, onward)           Start     Ordered   04/28/21 0805  For home use only DME Walker rolling  Once       Question Answer Comment  Walker: With 5 Inch Wheels   Patient needs a walker to treat with the following condition Rhabdomyolysis      04/28/21 0805            Follow-up Information     Corine Shelter, MD. Go on 04/30/2021.   Specialty: Pulmonary Disease Why: Thursday at 3:30 for your hospital follow up appointment Contact information: [address] [place] [address] [phone]  [facility], [facility] Follow up.   Specialty: Home Health Services Why: They will be in contact with you about sending someone out to the home-it could be up to a week as they are experiencing staff shortages. Contact information: [address] [place] [address] [phone]                No Known Allergies  Consultations: None   Procedures/Studies: CT HEAD WO CONTRAST  Result Date: 04/25/2021 CLINICAL DATA:  Fall.  Mental status change, unknown cause EXAM: CT HEAD WITHOUT CONTRAST TECHNIQUE: Contiguous axial images were obtained from the base of the skull through the  vertex without intravenous contrast. COMPARISON:  05/03/2013 FINDINGS: Brain: There is atrophy and chronic small vessel disease changes. No acute intracranial abnormality. Specifically, no hemorrhage, hydrocephalus, mass lesion, acute infarction, or significant intracranial injury. Vascular: No hyperdense vessel or unexpected calcification. Skull: No acute calvarial abnormality. Sinuses/Orbits: Near complete opacification of the right maxillary sinus. Mucosal thickening in scattered ethmoid air cells. No air-fluid levels. Other: None IMPRESSION: Atrophy, chronic microvascular disease. No acute intracranial abnormality. Electronically Signed   By: Charlett Nose M.D.   On: 04/25/2021 18:31   MR BRAIN WO CONTRAST  Result Date: 04/26/2021 CLINICAL DATA:  TIA EXAM: MRI HEAD WITHOUT CONTRAST TECHNIQUE: Multiplanar, multiecho pulse sequences of the brain and surrounding structures were obtained without intravenous contrast. COMPARISON:  01/14/2012 FINDINGS: Brain: No acute infarction, hemorrhage, hydrocephalus, extra-axial collection or mass lesion. Advanced brain atrophy, especially severe in the medial temporal lobes. Chronic small vessel ischemia that is confluent in the hemispheric white matter with chronic lacunar infarcts specifically seen at the bilateral thalamus and brainstem. Small remote left parietal cortex infarct. Vascular: Normal flow voids. Skull and upper cervical spine: Normal marrow signal. Sinuses/Orbits: Right maxillary sinusitis with complete opacification from peripheral mucosal thickening and central inspissated material, long-standing when compared to 2013. Interval clearance of the left maxillary sinus IMPRESSION: 1. No acute finding. 2. Advanced brain atrophy and chronic small vessel disease. Electronically Signed   By: Tiburcio Pea M.D.   On: 04/26/2021 10:35   DG Pelvis Portable  Result Date: 04/25/2021 CLINICAL DATA:  Fall EXAM: PORTABLE PELVIS 1-2 VIEWS COMPARISON:  None.  FINDINGS: There is no evidence of pelvic fracture or diastasis. No pelvic bone lesions are seen. IMPRESSION: Negative. Electronically Signed   By: Charlett Nose M.D.   On: 04/25/2021 18:30   DG Chest Port 1 View  Result Date: 04/25/2021 CLINICAL DATA:  Altered mental status EXAM: PORTABLE CHEST 1 VIEW COMPARISON:  11/19/2019 FINDINGS: The heart size and mediastinal contours are within normal limits. Both lungs are clear. The visualized skeletal structures are unremarkable. IMPRESSION: No active disease. Electronically Signed   By: Charlett Nose M.D.   On: 04/25/2021 18:30   EEG adult  Result Date: 04/27/2021 Charlsie Quest, MD     04/28/2021  8:42 AM Patient Name: Carlos Shields MRN: [id number] Epilepsy Attending: Charlsie Quest Referring Physician/Provider: Dr Jacquelin Hawking Date: 04/27/2021 Duration: 21.56 mins Patient history: 87 year old male with history of epilepsy who presented after an episode of fall and was noted to have a tongue bite.  EEG evaluate for seizure. Level of alertness: Awake AEDs during EEG study: LEV Technical aspects: This EEG study was done with scalp electrodes positioned according to the 10-20 International system of electrode placement. Electrical activity was acquired at a sampling rate of 500Hz  and reviewed with a high frequency filter of 70Hz  and a low frequency filter of  1Hz . EEG data were recorded continuously and digitally stored. Description: The posterior dominant rhythm consists of 9 Hz activity of moderate voltage (25-35 uV) seen predominantly in posterior head regions, symmetric and reactive to eye opening and eye closing. Hyperventilation and photic stimulation were not performed.   IMPRESSION: This study is within normal limits. No seizures or epileptiform discharges were seen throughout the recording. Priyanka      Subjective: No concerns today. Eager to go home.  Discharge Exam: Vitals:   04/28/21 0335 04/28/21 1002  BP: (!) 172/95 (!) 145/66   Pulse: 62 80  Resp: 18   Temp: 98.1 F (36.7 C)   SpO2: 99%    Vitals:   04/27/21 1445 04/27/21 2147 04/28/21 0335 04/28/21 1002  BP: (!) 149/75 (!) 172/72 (!) 172/95 (!) 145/66  Pulse: (!) 56 69 62 80  Resp: 19 20 18    Temp: 98.8 F (37.1 C) 97.6 F (36.4 C) 98.1 F (36.7 C)   TempSrc:  Oral Oral   SpO2: 95% 100% 99%   Weight:      Height:        General: Pt is alert, awake, not in acute distress Cardiovascular: RRR, S1/S2 +, no rubs, no gallops Respiratory: CTA bilaterally, no wheezing, no rhonchi Abdominal: Soft, NT, ND, bowel sounds + Extremities: no edema, no cyanosis    The results of significant diagnostics from this hospitalization (including imaging, microbiology, ancillary and laboratory) are listed below for reference.     Microbiology: Recent Results (from the past 240 hour(s))  Resp Panel by RT-PCR (Flu A&B, Covid) Nasopharyngeal Swab     Status: None   Collection Time: 04/25/21  4:57 PM   Specimen: Nasopharyngeal Swab; Nasopharyngeal(NP) swabs in vial transport medium  Result Value Ref Range Status   SARS Coronavirus 2 by RT PCR NEGATIVE NEGATIVE Final    Comment: (NOTE) SARS-CoV-2 target nucleic acids are NOT DETECTED.  The SARS-CoV-2 RNA is generally detectable in upper respiratory specimens during the acute phase of infection. The lowest concentration of SARS-CoV-2 viral copies this assay can detect is 138 copies/mL. A negative result does not preclude SARS-Cov-2 infection and should not be used as the sole basis for treatment or other patient management decisions. A negative result may occur with  improper specimen collection/handling, submission of specimen other than nasopharyngeal swab, presence of viral mutation(s) within the areas targeted by this assay, and inadequate number of viral copies(<138 copies/mL). A negative result must be combined with clinical observations, patient history, and epidemiological information. The expected result  is Negative.  Fact Sheet for Patients:   Fact Sheet for Healthcare Providers:  04/27/21  This test is no t yet approved or cleared by the [employer] FDA and  has been authorized for detection and/or diagnosis of SARS-CoV-2 by FDA under an Emergency Use Authorization (EUA). This EUA will remain  in effect (meaning this test can be used) for the duration of the COVID-19 declaration under Section 564(b)(1) of the Act, 21 U.S.C.section 360bbb-3(b)(1), unless the authorization is terminated  or revoked sooner.       Influenza A by PCR NEGATIVE NEGATIVE Final   Influenza B by PCR NEGATIVE NEGATIVE Final    Comment: (NOTE) The Xpert Xpress SARS-CoV-2/FLU/RSV plus assay is intended as an aid in the diagnosis of influenza from Nasopharyngeal swab specimens and should not be used as a sole basis for treatment. Nasal washings and aspirates are unacceptable for Xpert Xpress SARS-CoV-2/FLU/RSV testing.  Fact Sheet for Patients:  BloggerCourse.com  Fact Sheet for Healthcare Providers: SeriousBroker.it  This test is not yet approved or cleared by the [place] FDA and has been authorized for detection and/or diagnosis of SARS-CoV-2 by FDA under an Emergency Use Authorization (EUA). This EUA will remain in effect (meaning this test can be used) for the duration of the COVID-19 declaration under Section 564(b)(1) of the Act, 21 U.S.C. section 360bbb-3(b)(1), unless the authorization is terminated or revoked.  Performed at [facility], [address], [place], [place] [address]      Labs: BNP (last 3 results) No results for input(s): BNP in the last 8760 hours. Basic Metabolic Panel: Recent Labs  Lab 04/25/21 1655 04/25/21 1722 04/28/21 0436  NA 134* 137 133*  K 4.0 4.0 4.4  CL 103 105 105  CO2 21*  --  20*  GLUCOSE 110* 102* 99   BUN 24* 25* 18  CREATININE 1.43* 1.40* 1.22  CALCIUM 9.2  --  8.8*  MG 1.8  --   --    Liver Function Tests: Recent Labs  Lab 04/25/21 1655  AST 42*  ALT 18  ALKPHOS 70  BILITOT 1.7*  PROT 8.0  ALBUMIN 4.3   No results for input(s): LIPASE, AMYLASE in the last 168 hours. No results for input(s): AMMONIA in the last 168 hours. CBC: Recent Labs  Lab 04/25/21 1655 04/25/21 1722 04/28/21 0436  WBC 8.6  --  5.0  NEUTROABS 6.4  --   --   HGB 9.8* 10.5* 10.0*  HCT 30.5* 31.0* 31.2*  MCV 90.2  --  91.0  PLT 201  --  182   Cardiac Enzymes: Recent Labs  Lab 04/25/21 1655 04/25/21 2215 04/27/21 0552 04/28/21 0436  CKTOTAL 795* 772* 655* 800*   BNP: Invalid input(s): POCBNP CBG: Recent Labs  Lab 04/25/21 1739  GLUCAP 101*   D-Dimer No results for input(s): DDIMER in the last 72 hours. Hgb A1c No results for input(s): HGBA1C in the last 72 hours. Lipid Profile No results for input(s): CHOL, HDL, LDLCALC, TRIG, CHOLHDL, LDLDIRECT in the last 72 hours. Thyroid function studies No results for input(s): TSH, T4TOTAL, T3FREE, THYROIDAB in the last 72 hours.  Invalid input(s): FREET3 Anemia work up No results for input(s): VITAMINB12, FOLATE, FERRITIN, TIBC, IRON, RETICCTPCT in the last 72 hours. Urinalysis    Component Value Date/Time   COLORURINE YELLOW 04/25/2021 1655   APPEARANCEUR CLEAR 04/25/2021 1655   LABSPEC 1.013 04/25/2021 1655   PHURINE 6.0 04/25/2021 1655   GLUCOSEU NEGATIVE 04/25/2021 1655   HGBUR MODERATE (A) 04/25/2021 1655   BILIRUBINUR NEGATIVE 04/25/2021 1655   KETONESUR 5 (A) 04/25/2021 1655   PROTEINUR 30 (A) 04/25/2021 1655   UROBILINOGEN 0.2 05/04/2013 0337   NITRITE NEGATIVE 04/25/2021 1655   LEUKOCYTESUR NEGATIVE 04/25/2021 1655   Sepsis Labs Invalid input(s): PROCALCITONIN,  WBC,  LACTICIDVEN Microbiology Recent Results (from the past 240 hour(s))  Resp Panel by RT-PCR (Flu A&B, Covid) Nasopharyngeal Swab     Status: None    Collection Time: 04/25/21  4:57 PM   Specimen: Nasopharyngeal Swab; Nasopharyngeal(NP) swabs in vial transport medium  Result Value Ref Range Status   SARS Coronavirus 2 by RT PCR NEGATIVE NEGATIVE Final    Comment: (NOTE) SARS-CoV-2 target nucleic acids are NOT DETECTED.  The SARS-CoV-2 RNA is generally detectable in upper respiratory specimens during the acute phase of infection. The lowest concentration of SARS-CoV-2 viral copies this assay can detect is 138 copies/mL. A negative result does not  preclude SARS-Cov-2 infection and should not be used as the sole basis for treatment or other patient management decisions. A negative result may occur with  improper specimen collection/handling, submission of specimen other than nasopharyngeal swab, presence of viral mutation(s) within the areas targeted by this assay, and inadequate number of viral copies(<138 copies/mL). A negative result must be combined with clinical observations, patient history, and epidemiological information. The expected result is Negative.  Fact Sheet for Patients:  BloggerCourse.com  Fact Sheet for Healthcare Providers:  SeriousBroker.it  This test is no t yet approved or cleared by the [place] FDA and  has been authorized for detection and/or diagnosis of SARS-CoV-2 by FDA under an Emergency Use Authorization (EUA). This EUA will remain  in effect (meaning this test can be used) for the duration of the COVID-19 declaration under Section 564(b)(1) of the Act, 21 U.S.C.section 360bbb-3(b)(1), unless the authorization is terminated  or revoked sooner.       Influenza A by PCR NEGATIVE NEGATIVE Final   Influenza B by PCR NEGATIVE NEGATIVE Final    Comment: (NOTE) The Xpert Xpress SARS-CoV-2/FLU/RSV plus assay is intended as an aid in the diagnosis of influenza from Nasopharyngeal swab specimens and should not be used as a sole basis for treatment.  Nasal washings and aspirates are unacceptable for Xpert Xpress SARS-CoV-2/FLU/RSV testing.  Fact Sheet for Patients: BloggerCourse.com  Fact Sheet for Healthcare Providers: SeriousBroker.it  This test is not yet approved or cleared by the [place] FDA and has been authorized for detection and/or diagnosis of SARS-CoV-2 by FDA under an Emergency Use Authorization (EUA). This EUA will remain in effect (meaning this test can be used) for the duration of the COVID-19 declaration under Section 564(b)(1) of the Act, 21 U.S.C. section 360bbb-3(b)(1), unless the authorization is terminated or revoked.  Performed at [facility], [address], [place], [place] [address]     SIGNED:   Jacquelin Hawking, MD [facility] 04/28/2021, 12:53 PM

## 2021-04-28 NOTE — TOC Progression Note (Signed)
Transition of Care [facility]) - Progression Note    Patient Details  Name: Carlos Shields MRN: [id number] Date of Birth: 1931/05/09  Transition of [facility]) CM/SW Contact  Ida Rogue, [place] Phone Number: 04/28/2021, 9:08 AM  Clinical Narrative:   Sherron Monday with Darnelle Maffucci with [facility] who agrees to provide [facility] services for patient.  Spoke with Duwayne Heck with [facility] who will arrange for delivery of RW. TOC will continue to follow during the course of hospitalization.     Expected Discharge Plan: Home w Home Health Services Barriers to Discharge: No Barriers Identified  Expected Discharge Plan and Services Expected Discharge Plan: Home w Home Health Services   Discharge Planning Services: CM Consult Post Acute Care Choice: Durable Medical Equipment, Home Health Living arrangements for the past 2 months: Single Family Home                                       Social Determinants of Health (SDOH) Interventions    Readmission Risk Interventions No flowsheet data found.

## 2021-04-28 NOTE — Discharge Instructions (Signed)
Carlos Shields,  You were in the hospital with weakness. This may have been related to your muscle injury and need for fluids. You are seemingly much better. There was a concern for possible seizure but this is unknown. You did not have any seizures while in the hospital and your EEG was good. Please follow-up with your primary care physician.

## 2021-04-28 NOTE — Progress Notes (Signed)
Spoke w/ pt daughter [name] and she stated that she would be here around 1700 to pick patient up.

## 2021-06-02 ENCOUNTER — Other Ambulatory Visit (HOSPITAL_COMMUNITY): Payer: Self-pay | Admitting: Pulmonary Disease

## 2021-06-02 ENCOUNTER — Other Ambulatory Visit: Payer: Self-pay | Admitting: Pulmonary Disease

## 2021-06-02 DIAGNOSIS — R1310 Dysphagia, unspecified: Secondary | ICD-10-CM

## 2021-06-10 ENCOUNTER — Other Ambulatory Visit: Payer: Self-pay

## 2021-06-10 ENCOUNTER — Other Ambulatory Visit (HOSPITAL_COMMUNITY): Payer: Self-pay | Admitting: Pulmonary Disease

## 2021-06-10 ENCOUNTER — Ambulatory Visit (HOSPITAL_COMMUNITY)
Admission: RE | Admit: 2021-06-10 | Discharge: 2021-06-10 | Disposition: A | Discharge status: Home / Self Care | Payer: Medicare Other | Source: Ambulatory Visit | Attending: Pulmonary Disease | Admitting: Pulmonary Disease

## 2021-06-10 DIAGNOSIS — R1310 Dysphagia, unspecified: Secondary | ICD-10-CM | POA: Insufficient documentation

## 2021-11-03 DIAGNOSIS — I10 Essential (primary) hypertension: Secondary | ICD-10-CM | POA: Diagnosis not present

## 2021-11-03 DIAGNOSIS — Z0000 Encounter for general adult medical examination without abnormal findings: Secondary | ICD-10-CM | POA: Diagnosis not present

## 2021-11-03 DIAGNOSIS — R739 Hyperglycemia, unspecified: Secondary | ICD-10-CM | POA: Diagnosis not present

## 2021-11-03 DIAGNOSIS — R269 Unspecified abnormalities of gait and mobility: Secondary | ICD-10-CM | POA: Diagnosis not present

## 2021-11-03 DIAGNOSIS — Z13220 Encounter for screening for lipoid disorders: Secondary | ICD-10-CM | POA: Diagnosis not present

## 2021-11-17 DIAGNOSIS — R269 Unspecified abnormalities of gait and mobility: Secondary | ICD-10-CM | POA: Diagnosis not present

## 2021-11-17 DIAGNOSIS — I10 Essential (primary) hypertension: Secondary | ICD-10-CM | POA: Diagnosis not present

## 2021-11-17 DIAGNOSIS — N289 Disorder of kidney and ureter, unspecified: Secondary | ICD-10-CM | POA: Diagnosis not present

## 2021-11-17 DIAGNOSIS — R739 Hyperglycemia, unspecified: Secondary | ICD-10-CM | POA: Diagnosis not present

## 2021-11-20 DIAGNOSIS — R269 Unspecified abnormalities of gait and mobility: Secondary | ICD-10-CM | POA: Diagnosis not present

## 2021-11-27 DIAGNOSIS — R269 Unspecified abnormalities of gait and mobility: Secondary | ICD-10-CM | POA: Diagnosis not present

## 2021-11-27 DIAGNOSIS — Z8673 Personal history of transient ischemic attack (TIA), and cerebral infarction without residual deficits: Secondary | ICD-10-CM | POA: Diagnosis not present

## 2021-11-27 DIAGNOSIS — M5031 Other cervical disc degeneration,  high cervical region: Secondary | ICD-10-CM | POA: Diagnosis not present

## 2021-12-15 DIAGNOSIS — R269 Unspecified abnormalities of gait and mobility: Secondary | ICD-10-CM | POA: Diagnosis not present

## 2021-12-29 DIAGNOSIS — R739 Hyperglycemia, unspecified: Secondary | ICD-10-CM | POA: Diagnosis not present

## 2021-12-29 DIAGNOSIS — I10 Essential (primary) hypertension: Secondary | ICD-10-CM | POA: Diagnosis not present

## 2021-12-29 DIAGNOSIS — I872 Venous insufficiency (chronic) (peripheral): Secondary | ICD-10-CM | POA: Diagnosis not present

## 2021-12-29 DIAGNOSIS — N289 Disorder of kidney and ureter, unspecified: Secondary | ICD-10-CM | POA: Diagnosis not present

## 2021-12-29 DIAGNOSIS — R269 Unspecified abnormalities of gait and mobility: Secondary | ICD-10-CM | POA: Diagnosis not present

## 2022-01-05 DIAGNOSIS — R252 Cramp and spasm: Secondary | ICD-10-CM | POA: Diagnosis not present

## 2022-01-05 DIAGNOSIS — R600 Localized edema: Secondary | ICD-10-CM | POA: Diagnosis not present

## 2022-01-15 DIAGNOSIS — R269 Unspecified abnormalities of gait and mobility: Secondary | ICD-10-CM | POA: Diagnosis not present

## 2022-01-15 DIAGNOSIS — I6523 Occlusion and stenosis of bilateral carotid arteries: Secondary | ICD-10-CM | POA: Diagnosis not present

## 2022-01-15 DIAGNOSIS — R9082 White matter disease, unspecified: Secondary | ICD-10-CM | POA: Diagnosis not present

## 2022-01-19 DIAGNOSIS — R269 Unspecified abnormalities of gait and mobility: Secondary | ICD-10-CM | POA: Diagnosis not present

## 2022-01-28 DIAGNOSIS — I83893 Varicose veins of bilateral lower extremities with other complications: Secondary | ICD-10-CM | POA: Diagnosis not present

## 2022-02-02 DIAGNOSIS — R600 Localized edema: Secondary | ICD-10-CM | POA: Diagnosis not present

## 2022-02-02 DIAGNOSIS — R252 Cramp and spasm: Secondary | ICD-10-CM | POA: Diagnosis not present

## 2022-02-02 DIAGNOSIS — I872 Venous insufficiency (chronic) (peripheral): Secondary | ICD-10-CM | POA: Diagnosis not present

## 2022-02-25 DIAGNOSIS — R739 Hyperglycemia, unspecified: Secondary | ICD-10-CM | POA: Diagnosis not present

## 2022-02-25 DIAGNOSIS — R269 Unspecified abnormalities of gait and mobility: Secondary | ICD-10-CM | POA: Diagnosis not present

## 2022-02-25 DIAGNOSIS — I872 Venous insufficiency (chronic) (peripheral): Secondary | ICD-10-CM | POA: Diagnosis not present

## 2022-02-25 DIAGNOSIS — I10 Essential (primary) hypertension: Secondary | ICD-10-CM | POA: Diagnosis not present

## 2022-02-25 DIAGNOSIS — N289 Disorder of kidney and ureter, unspecified: Secondary | ICD-10-CM | POA: Diagnosis not present

## 2022-03-11 DIAGNOSIS — N2581 Secondary hyperparathyroidism of renal origin: Secondary | ICD-10-CM | POA: Diagnosis not present

## 2022-03-11 DIAGNOSIS — N1832 Chronic kidney disease, stage 3b: Secondary | ICD-10-CM | POA: Diagnosis not present

## 2022-03-11 DIAGNOSIS — I129 Hypertensive chronic kidney disease with stage 1 through stage 4 chronic kidney disease, or unspecified chronic kidney disease: Secondary | ICD-10-CM | POA: Diagnosis not present

## 2022-03-11 DIAGNOSIS — M7989 Other specified soft tissue disorders: Secondary | ICD-10-CM | POA: Diagnosis not present

## 2022-03-11 DIAGNOSIS — M6282 Rhabdomyolysis: Secondary | ICD-10-CM | POA: Diagnosis not present

## 2022-03-11 DIAGNOSIS — D631 Anemia in chronic kidney disease: Secondary | ICD-10-CM | POA: Diagnosis not present

## 2022-03-11 DIAGNOSIS — N189 Chronic kidney disease, unspecified: Secondary | ICD-10-CM | POA: Diagnosis not present

## 2022-03-12 ENCOUNTER — Other Ambulatory Visit: Payer: Self-pay | Admitting: Nephrology

## 2022-03-12 DIAGNOSIS — N1832 Chronic kidney disease, stage 3b: Secondary | ICD-10-CM

## 2022-03-29 DIAGNOSIS — I10 Essential (primary) hypertension: Secondary | ICD-10-CM | POA: Diagnosis not present

## 2022-03-29 DIAGNOSIS — Z23 Encounter for immunization: Secondary | ICD-10-CM | POA: Diagnosis not present

## 2022-03-29 DIAGNOSIS — I872 Venous insufficiency (chronic) (peripheral): Secondary | ICD-10-CM | POA: Diagnosis not present

## 2022-03-29 DIAGNOSIS — R269 Unspecified abnormalities of gait and mobility: Secondary | ICD-10-CM | POA: Diagnosis not present

## 2022-03-29 DIAGNOSIS — N1832 Chronic kidney disease, stage 3b: Secondary | ICD-10-CM | POA: Diagnosis not present

## 2022-03-29 DIAGNOSIS — Z0000 Encounter for general adult medical examination without abnormal findings: Secondary | ICD-10-CM | POA: Diagnosis not present

## 2022-05-24 DIAGNOSIS — R1319 Other dysphagia: Secondary | ICD-10-CM | POA: Diagnosis not present

## 2022-05-24 DIAGNOSIS — N1832 Chronic kidney disease, stage 3b: Secondary | ICD-10-CM | POA: Diagnosis not present

## 2022-05-24 DIAGNOSIS — R269 Unspecified abnormalities of gait and mobility: Secondary | ICD-10-CM | POA: Diagnosis not present

## 2022-05-24 DIAGNOSIS — Z0001 Encounter for general adult medical examination with abnormal findings: Secondary | ICD-10-CM | POA: Diagnosis not present

## 2022-05-24 DIAGNOSIS — I10 Essential (primary) hypertension: Secondary | ICD-10-CM | POA: Diagnosis not present

## 2022-05-24 DIAGNOSIS — I872 Venous insufficiency (chronic) (peripheral): Secondary | ICD-10-CM | POA: Diagnosis not present

## 2022-05-27 ENCOUNTER — Encounter: Payer: Self-pay | Admitting: Internal Medicine

## 2022-06-09 ENCOUNTER — Encounter: Payer: Self-pay | Admitting: Physician Assistant

## 2022-06-16 ENCOUNTER — Ambulatory Visit: Payer: Medicare Other | Admitting: Physician Assistant

## 2022-06-16 ENCOUNTER — Encounter: Payer: Self-pay | Admitting: Physician Assistant

## 2022-06-16 VITALS — BP 124/76 | HR 65 | Ht 64.0 in | Wt 114.0 lb

## 2022-06-16 DIAGNOSIS — K5904 Chronic idiopathic constipation: Secondary | ICD-10-CM | POA: Diagnosis not present

## 2022-06-16 DIAGNOSIS — R1312 Dysphagia, oropharyngeal phase: Secondary | ICD-10-CM

## 2022-06-16 DIAGNOSIS — R569 Unspecified convulsions: Secondary | ICD-10-CM | POA: Diagnosis not present

## 2022-06-16 MED ORDER — PANTOPRAZOLE SODIUM 40 MG PO TBEC: 40.0000 mg | DELAYED_RELEASE_TABLET | Freq: Every day | ORAL | 3 refills | Status: DC

## 2022-06-16 NOTE — Progress Notes (Signed)
06/16/2022 [name] [id number] [birth year]  Referring provider: Vincente Liberty, MD Primary GI doctor: Dr. Rush Landmark  ASSESSMENT AND PLAN:    Oropharyngeal dysphagia Worsening last 6 months, history of poor dentition. 06/10/2021 upper GI for dysphagia unremarkable. Patient is also complaining of tremor, shuffling gait with seizures, question, age, poor dentition, parkinsonian? Added on Protonix once daily for possible reflux related dysmotility. Will plan for barium swallow with tablet, if positive can consider endoscopy for dilatation. May benefit from modified barium swallow pending results.  Chronic idiopathic constipation - Increase fiber/ water intake, decrease caffeine, increase activity level. -Will add on Miralax daily - no obstructive symptoms at this time.  Seizures ([place]) Continue follow-up neurology   Patient Care Team: Vincente Liberty, MD as PCP - General (Pulmonary Disease)  HISTORY OF PRESENT ILLNESS: 88 y.o. male with a past medical history of hypertension, CKD stage III, history of seizures, anemia and others listed below presents for evaluation of dysphagia.   11/2011 hospital visit for seizure with anemia at that time.   Not had a colonoscopy in 5 to 10 years.   Iron 45, saturation ratios 19, ferritin 55.  Normal B12, folate 06/10/2021 upper GI for dysphagia showed swallowed barium without difficulty, normal hypopharyngeal contours, no aspiration no esophageal mass or stricture, mobility is within normal limits, no hiatal hernia no spontaneous GERD, stomach duodenum unremarkable patient swallowed 13 mm barium tablet without difficulty and passed in the stomach without delay.  05/25/2022 urgent consult from PCP oropharyngeal dysphgia, unable to swallow solids.  Patient also complaining of tremor, shuffling gait  Daughter is here with him.  He walks with a walker Has poor dentition.   States last 6 months with eating will feel food get caught in  his throat.  Denies odnyophagia, issues with liquids, or coughing during food.  Denies GERD.  No pain medication, no ETOH.  No weight loss per patient but has had about 6 lbs from our scales in a year.  No AB pain.  He has hard stools, no blood in stool or black stool.   Wt Readings from Last 3 Encounters:  06/16/22 114 lb (51.7 kg)  04/25/21 120 lb (54.4 kg)  11/19/19 120 lb (54.4 kg)     He  reports that he has been smoking cigars. He has never used smokeless tobacco. He reports that he does not drink alcohol and does not use drugs.  Current Medications:    Current Outpatient Medications (Cardiovascular):    metoprolol tartrate (LOPRESSOR) 50 MG tablet, Take 50 mg by mouth daily.     Current Outpatient Medications (Other):    levETIRAcetam (KEPPRA) 500 MG tablet, Take 1 tablet (500 mg total) by mouth 2 (two) times daily.   multivitamin (ONE-A-DAY MEN'S) TABS tablet, Take 1 tablet by mouth daily.   pantoprazole (PROTONIX) 40 MG tablet, Take 1 tablet (40 mg total) by mouth daily.  Medical History:  Past Medical History:  Diagnosis Date   CKD (chronic kidney disease) stage 3, GFR 30-59 ml/min (HCC)    Gait abnormality    Hypertension    Pancreatitis    Seizures (HCC)    Venous insufficiency    Allergies: No Known Allergies   Surgical History:  He  has a past surgical history that includes Hernia repair. Family History:  His family history includes Asthma in an other family member; Diabetes in an other family member; Hypertension in an other family member.  REVIEW OF SYSTEMS  : All other systems reviewed and  negative except where noted in the History of Present Illness.  PHYSICAL EXAM: BP 124/76   Pulse 65   Ht 5\' 4"  (1.626 m)   Wt 114 lb (51.7 kg)   BMI 19.57 kg/m  General:   Pleasant, well developed male in no acute distress Head:   Normocephalic and atraumatic. Eyes:  sclerae anicteric,conjunctive pink  Heart:   regular rate and rhythm Pulm:  Clear  anteriorly; no wheezing Abdomen:   Soft, Obese AB, Without guarding and Without rebound bowel sounds. No tenderness . Without guarding and Without rebound, No organomegaly appreciated. Rectal: Not evaluated Extremities:  Without edema. Msk: Symmetrical without gross deformities. Peripheral pulses intact.  Neurologic:  Alert and  oriented x1;  dysphagia, walking with walker,  Skin:   Dry and intact without significant lesions or rashes. Psychiatric:  insight impaired.   RELEVANT LABS AND IMAGING: CBC    Component Value Date/Time   WBC 5.0 04/28/2021 0436   RBC 3.43 (L) 04/28/2021 0436   HGB 10.0 (L) 04/28/2021 0436   HCT 31.2 (L) 04/28/2021 0436   PLT 182 04/28/2021 0436   MCV 91.0 04/28/2021 0436   MCH 29.2 04/28/2021 0436   MCHC 32.1 04/28/2021 0436   RDW 12.9 04/28/2021 0436   LYMPHSABS 1.3 04/25/2021 1655   MONOABS 0.9 04/25/2021 1655   EOSABS 0.0 04/25/2021 1655   BASOSABS 0.0 04/25/2021 1655    CMP     Component Value Date/Time   NA 133 (L) 04/28/2021 0436   K 4.4 04/28/2021 0436   CL 105 04/28/2021 0436   CO2 20 (L) 04/28/2021 0436   GLUCOSE 99 04/28/2021 0436   BUN 18 04/28/2021 0436   CREATININE 1.22 04/28/2021 0436   CALCIUM 8.8 (L) 04/28/2021 0436   PROT 8.0 04/25/2021 1655   ALBUMIN 4.3 04/25/2021 1655   AST 42 (H) 04/25/2021 1655   ALT 18 04/25/2021 1655   ALKPHOS 70 04/25/2021 1655   BILITOT 1.7 (H) 04/25/2021 1655   GFRNONAA 57 (L) 04/28/2021 0436   GFRAA 51 (L) 11/19/2019 2118     2119, PA-C 2:42 PM

## 2022-06-16 NOTE — Patient Instructions (Addendum)
_______________________________________________________  If you are age 86 or older, your body mass index should be between 23-30. Your Body mass index is 19.57 kg/m. If this is out of the aforementioned range listed, please consider follow up with your Primary Care Provider.  If you are age 49 or younger, your body mass index should be between 19-25. Your Body mass index is 19.57 kg/m. If this is out of the aformentioned range listed, please consider follow up with your Primary Care Provider.   ________________________________________________________  The  GI providers would like to encourage you to use [facility] to communicate with providers for non-urgent requests or questions.  Due to long hold times on the telephone, sending your provider a message by [facility] may be a faster and more efficient way to get a response.  Please allow 48 business hours for a response.  Please remember that this is for non-urgent requests.  _______________________________________________________  Bonita Quin have been scheduled for a Barium Esophogram at [facility] (1st floor of the hospital) on 06-18-2022 at 9am. Please arrive 30 minutes prior to your appointment for registration. Make certain not to have anything to eat or drink 3 hours prior to your test. If you need to reschedule for any reason, please contact radiology at [phone] to do so. __________________________________________________________________ A barium swallow is an examination that concentrates on views of the esophagus. This tends to be a double contrast exam (barium and two liquids which, when combined, create a gas to distend the wall of the oesophagus) or single contrast (non-ionic iodine based). The study is usually tailored to your symptoms so a good history is essential. Attention is paid during the study to the form, structure and configuration of the esophagus, looking for functional disorders (such as aspiration, dysphagia,  achalasia, motility and reflux) EXAMINATION You may be asked to change into a gown, depending on the type of swallow being performed. A radiologist and radiographer will perform the procedure. The radiologist will advise you of the type of contrast selected for your procedure and direct you during the exam. You will be asked to stand, sit or lie in several different positions and to hold a small amount of fluid in your mouth before being asked to swallow while the imaging is performed .In some instances you may be asked to swallow barium coated marshmallows to assess the motility of a solid food bolus. The exam can be recorded as a digital or video fluoroscopy procedure. POST PROCEDURE It will take 1-2 days for the barium to pass through your system. To facilitate this, it is important, unless otherwise directed, to increase your fluids for the next 24-48hrs and to resume your normal diet.  This test typically takes about 30 minutes to perform. __________________________________________________________________________________     Eather Colas is an osmotic laxative.  It only brings more water into the stool.  This is safe to take daily.  Can take up to 17 gram of miralax twice a day.  Mix with juice or coffee.  Start 1 capful at night for 3-4 days and reassess your response in 3-4 days.  You can increase and decrease the dose based on your response.  Remember, it can take up to 3-4 days to take effect OR for the effects to wear off.   I often pair this with benefiber in the morning to help assure the stool is not too loose.   Toileting tips to help with your constipation - Drink at least 64-80 ounces of water/liquid per day. - Establish  a time to try to move your bowels every day.  For many people, this is after a cup of coffee or after a meal such as breakfast. - Sit all of the way back on the toilet keeping your back fairly straight and while sitting up, try to rest the tops of your forearms  on your upper thighs.   - Raising your feet with a step stool/squatty potty can be helpful to improve the angle that allows your stool to pass through the rectum. - Relax the rectum feeling it bulge toward the toilet water.  If you feel your rectum raising toward your body, you are contracting rather than relaxing. - Breathe in and slowly exhale. "Belly breath" by expanding your belly towards your belly button. Keep belly expanded as you gently direct pressure down and back to the anus.  A low pitched GRRR sound can assist with increasing intra-abdominal pressure.  - Repeat 3-4 times. If unsuccessful, contract the pelvic floor to restore normal tone and get off the toilet.  Avoid excessive straining. - To reduce excessive wiping by teaching your anus to normally contract, place hands on outer aspect of knees and resist knee movement outward.  Hold 5-10 second then place hands just inside of knees and resist inward movement of knees.  Hold 5 seconds.  Repeat a few times each way.  Dysphagia precautions:  1. Take reflux medications 30+ minutes before food in the morning- protonix 40 mg once a day.  2. Begin meals with warm beverage 3. Eat smaller more frequent meals 4. Eat slowly, taking small bites and sips 5. Alternate solids and liquids 6. Avoid foods/liquids that increase acid production 7. Sit upright during and for 30+ minutes after meals to facilitate esophageal clearing 8. All meats should be chopped finely.   If something gets hung in your esophagus and will not come up or go down, proceed to the emergency room.    Due to recent changes in healthcare laws, you may see the results of your imaging and laboratory studies on MyChart before your provider has had a chance to review them.  We understand that in some cases there may be results that are confusing or concerning to you. Not all laboratory results come back in the same time frame and the provider may be waiting for multiple results in  order to interpret others.  Please give [place] 48 hours in order for your provider to thoroughly review all the results before contacting the office for clarification of your results.     It was a pleasure to see you today!  Thank you for trusting me with your gastrointestinal care!

## 2022-06-17 NOTE — Progress Notes (Signed)
Attending Physician's Attestation   I have reviewed the chart.   I agree with the Advanced Practitioner's note, impression, and recommendations with any updates as below.    Ayren Zumbro Mansouraty, MD [facility] Advanced [facility]oscopy Office # [phone]  

## 2022-06-18 ENCOUNTER — Telehealth: Payer: Self-pay

## 2022-06-18 ENCOUNTER — Other Ambulatory Visit: Payer: Self-pay | Admitting: Physician Assistant

## 2022-06-18 ENCOUNTER — Ambulatory Visit (HOSPITAL_COMMUNITY)
Admission: RE | Admit: 2022-06-18 | Discharge: 2022-06-18 | Disposition: A | Discharge status: Home / Self Care | Payer: Medicare Other | Source: Ambulatory Visit | Attending: Physician Assistant | Admitting: Physician Assistant

## 2022-06-18 DIAGNOSIS — R1312 Dysphagia, oropharyngeal phase: Secondary | ICD-10-CM | POA: Diagnosis not present

## 2022-06-18 DIAGNOSIS — R1310 Dysphagia, unspecified: Secondary | ICD-10-CM | POA: Diagnosis not present

## 2022-06-18 NOTE — Telephone Encounter (Signed)
Radiology calling with results of DG Esophagus which are in EPIC. Radiologist wants to be sure it is seen.  IMPRESSION: 1. Deep laryngeal penetration with sips through the straw. Speech pathology evaluation may be helpful, at this time a pill could be swallowed in the upright position. The patient could not stand safely for this portion of the evaluation. The daughter reports worsening mobility since Thanksgiving and a series of falls. 2. Possible small Zenker's diverticulum. 3. Potential smooth filling defect in the duodenum in the duodenal bulb measuring up to 2.5 cm. Question of filling defect seen on previous upper GI images though an air bubble could have a similar appearance based on the previous upper GI images. This was an incidental finding on today's study and is of uncertain significance. Often duodenal lesions are benign though this cannot be further characterized. CT of the abdomen with contrast with water immediately prior to scanning may be helpful to determine whether there is a gross lesion in this location. 4. Esophageal stasis and tertiary peristaltic activity.     Electronically Signed   By: Donzetta Kohut M.D.   On: 06/18/2022 10:21

## 2022-06-21 DIAGNOSIS — R1319 Other dysphagia: Secondary | ICD-10-CM | POA: Diagnosis not present

## 2022-06-21 DIAGNOSIS — R269 Unspecified abnormalities of gait and mobility: Secondary | ICD-10-CM | POA: Diagnosis not present

## 2022-06-21 DIAGNOSIS — I872 Venous insufficiency (chronic) (peripheral): Secondary | ICD-10-CM | POA: Diagnosis not present

## 2022-06-21 DIAGNOSIS — N1832 Chronic kidney disease, stage 3b: Secondary | ICD-10-CM | POA: Diagnosis not present

## 2022-06-21 DIAGNOSIS — R739 Hyperglycemia, unspecified: Secondary | ICD-10-CM | POA: Diagnosis not present

## 2022-06-21 DIAGNOSIS — I10 Essential (primary) hypertension: Secondary | ICD-10-CM | POA: Diagnosis not present

## 2022-06-21 DIAGNOSIS — Z13220 Encounter for screening for lipoid disorders: Secondary | ICD-10-CM | POA: Diagnosis not present

## 2022-06-25 ENCOUNTER — Other Ambulatory Visit: Payer: Self-pay

## 2022-06-25 DIAGNOSIS — R933 Abnormal findings on diagnostic imaging of other parts of digestive tract: Secondary | ICD-10-CM

## 2022-06-25 DIAGNOSIS — R1312 Dysphagia, oropharyngeal phase: Secondary | ICD-10-CM

## 2022-07-02 ENCOUNTER — Ambulatory Visit (HOSPITAL_COMMUNITY)
Admission: RE | Admit: 2022-07-02 | Discharge: 2022-07-02 | Disposition: A | Discharge status: Home / Self Care | Payer: Medicare Other | Source: Ambulatory Visit | Attending: Physician Assistant | Admitting: Physician Assistant

## 2022-07-02 ENCOUNTER — Other Ambulatory Visit (HOSPITAL_COMMUNITY): Payer: Self-pay

## 2022-07-02 DIAGNOSIS — N200 Calculus of kidney: Secondary | ICD-10-CM | POA: Diagnosis not present

## 2022-07-02 DIAGNOSIS — K8020 Calculus of gallbladder without cholecystitis without obstruction: Secondary | ICD-10-CM | POA: Diagnosis not present

## 2022-07-02 DIAGNOSIS — K5730 Diverticulosis of large intestine without perforation or abscess without bleeding: Secondary | ICD-10-CM | POA: Diagnosis not present

## 2022-07-02 DIAGNOSIS — R933 Abnormal findings on diagnostic imaging of other parts of digestive tract: Secondary | ICD-10-CM | POA: Insufficient documentation

## 2022-07-02 DIAGNOSIS — N281 Cyst of kidney, acquired: Secondary | ICD-10-CM | POA: Diagnosis not present

## 2022-07-02 DIAGNOSIS — R1310 Dysphagia, unspecified: Secondary | ICD-10-CM

## 2022-07-02 LAB — POCT I-STAT CREATININE: Creatinine, Ser: 1.9 mg/dL — ABNORMAL HIGH (ref 0.61–1.24)

## 2022-07-02 MED ORDER — IOHEXOL 300 MG/ML  SOLN
100.0000 mL | Freq: Once | INTRAMUSCULAR | Status: AC | PRN
  Administered 2022-07-02: 100 mL via INTRAVENOUS

## 2022-07-22 ENCOUNTER — Ambulatory Visit (HOSPITAL_COMMUNITY)
Admission: RE | Admit: 2022-07-22 | Discharge: 2022-07-22 | Disposition: A | Discharge status: Home / Self Care | Payer: Medicare Other | Source: Ambulatory Visit | Attending: Physician Assistant | Admitting: Physician Assistant

## 2022-07-22 DIAGNOSIS — R1310 Dysphagia, unspecified: Secondary | ICD-10-CM | POA: Diagnosis not present

## 2022-07-22 DIAGNOSIS — N183 Chronic kidney disease, stage 3 unspecified: Secondary | ICD-10-CM | POA: Insufficient documentation

## 2022-07-22 DIAGNOSIS — I129 Hypertensive chronic kidney disease with stage 1 through stage 4 chronic kidney disease, or unspecified chronic kidney disease: Secondary | ICD-10-CM | POA: Insufficient documentation

## 2022-07-22 DIAGNOSIS — K0889 Other specified disorders of teeth and supporting structures: Secondary | ICD-10-CM | POA: Insufficient documentation

## 2022-07-22 DIAGNOSIS — R059 Cough, unspecified: Secondary | ICD-10-CM | POA: Insufficient documentation

## 2022-07-22 DIAGNOSIS — R1312 Dysphagia, oropharyngeal phase: Secondary | ICD-10-CM

## 2022-07-22 DIAGNOSIS — R933 Abnormal findings on diagnostic imaging of other parts of digestive tract: Secondary | ICD-10-CM

## 2022-07-22 DIAGNOSIS — R1311 Dysphagia, oral phase: Secondary | ICD-10-CM | POA: Diagnosis not present

## 2022-07-22 DIAGNOSIS — Z0389 Encounter for observation for other suspected diseases and conditions ruled out: Secondary | ICD-10-CM | POA: Diagnosis not present

## 2022-07-22 NOTE — Progress Notes (Incomplete)
Objective Swallowing Evaluation: Type of Study: MBS-Modified Barium Swallow Study   Patient Details  Name: Carlos Shields MRN: [id number] Date of Birth: 11/07/1930  Today's Date: 07/22/2022 Time: SLP Start Time (ACUTE ONLY): 1331 -SLP Stop Time (ACUTE ONLY): 1355  SLP Time Calculation (min) (ACUTE ONLY): 24 min   Past Medical History:  Past Medical History:  Diagnosis Date   CKD (chronic kidney disease) stage 3, GFR 30-59 ml/min (HCC)    Gait abnormality    Hypertension    Pancreatitis    Seizures (HCC)    Venous insufficiency    Past Surgical History:  Past Surgical History:  Procedure Laterality Date   HERNIA REPAIR     HPI: pt is an 89 yo male referred for OP MBS due to concerns for pt having pharyngeal dysphagia.  Pt noted to have potential small Zenker's diverticulum.  Prior brain imaging showed atrophy, lacunar cva bilateral thalamic, brainstem and small left parietal cva.   Pt reports more issues swallowing foods than liquids, sensing lodging - pointing to left side of his throat.   He denies requiring heimlich manuever, losing weight unintentionally or having recurrent pneumonias.  Prior h/o ETOH use - ceased many years ago per pt - and pt currently uses cigars per GI note.  No family present when xray staff went to retrieve pt for MBS therefore testing completed as ordered.   Subjective: pt awake in bed    Recommendations for follow up therapy are one component of a multi-disciplinary discharge planning process, led by the attending physician.  Recommendations may be updated based on patient status, additional functional criteria and insurance authorization.  Assessment / Plan / Recommendation     07/22/2022    2:00 PM  Clinical Impressions  Clinical Impression Patient presents with minimal oral difficulties with solids with vertical mastication pattern and requiring 3 swallows to clear small bolus of cracker.  Functional oropharyngeal swallow with thin, nectar,  and  pudding.  Pt transited tablet into pharynx without liquid - as was pooled at vallecular space when xray was turned on.  Liquid swallows transited tablet into esophagus.  Suspect UES impaired opening preventing tablet from moving distally into esophagus despite several extra boluses of barium.  Trace laryngeal penetration of thin noted which is [facility] for his age.  Single episode of penetration of thin to cords via cup x1 due to impaired timing of laryngeal closure.  Cued cough effectively cleared.  Pharyngeal swallow is strong without retention.  As daughter was not present for exam and given pt's advanced age, recommend consider follow up session with HH or OP SLP for dysphagia education.  Thanks for this referral.  Educated pt to test results while testing completed.  SLP advised radiation technologist to advise daughter to tablet halting and need for pt to drink plenty of liquids today and inform daughter that SLP will review flouro loops and report will be placed in [place] today.  SLP Visit Diagnosis Dysphagia, oral phase (R13.11)  Impact on safety and function Mild aspiration risk          No data to display               No data to display             07/22/2022    2:00 PM  Diet Recommendations  SLP Diet Recommendations Regular solids;Thin liquid  Liquid Administration via Cup;Straw  Medication Administration --  Compensations Slow rate;Small sips/bites  Postural Changes Seated upright at 90 degrees;Remain  semi-upright after after feeds/meals (Comment)         07/22/2022    2:00 PM  Other Recommendations  Follow Up Recommendations Outpatient SLP        No data to display               07/22/2022    2:00 PM  Oral Phase  Oral Phase Impaired  Oral - Nectar Cup [facility]  Oral - Thin Teaspoon WFL  Oral - Thin Cup [facility];Piecemeal swallowing;Premature spillage  Oral - Thin Straw WFL;Piecemeal swallowing;Premature spillage  Oral - Puree WFL  Oral - Mech Soft Impaired  mastication;Lingual/palatal residue;Decreased bolus cohesion  Oral - Pill Premature spillage  Oral Phase - Comment pt with prolonged vertical mastication pattern with small solid bolus-  with oral retention - clearing with 3 swallows       07/22/2022    2:00 PM  Pharyngeal Phase  Pharyngeal Phase WFL;Impaired  Pharyngeal- Nectar Cup [facility]  Pharyngeal- Thin Teaspoon WFL  Pharyngeal- Thin Cup [facility];Penetration/Apiration after swallow  Pharyngeal Material enters airway, CONTACTS cords and not ejected out  Pharyngeal- Thin Straw WFL  Pharyngeal- Puree WFL  Pharyngeal- Mechanical Soft WFL  Pharyngeal- Pill Delayed swallow initiation-vallecula  Pharyngeal Comment Single episode of trace laryngeal penetration to vocal cords of thin with sequential swallows - that cleared with furhter swallows - WFL for his age, single episode of penetration to vocal cord without sensation -  due to impaired timing of laryngeal closure with sequential swallows, cued cough effective to clear barium        07/22/2022    2:00 PM  Cervical Esophageal Phase   Cervical Esophageal Phase Impaired  Nectar Cup [facility]  Thin Teaspoon WFL  Thin Cup WFL  Thin Straw WFL  Puree WFL  Mechanical Soft WFL  Pill Reduced cricopharyngeal relaxation;Other (Comment)  Cervical Esophageal Comment Tablet appeared to halt above UES - with replication of symptoms of halting in left side of pharynx.  Challenging view given pt was in wheelchair obscuring view.  In A-P view, tablet appeared slightly to the left in the esophagus. Mulitple boluse of thin liquid, pudding, thin warm water did not transit tablet.  Pt continued with sensation of retention - Suspect this is source of his dysphagia.  Tablet did not transit into pharynx fortunately.     Macario Golds 07/22/2022, 2:55 PM

## 2022-07-30 ENCOUNTER — Other Ambulatory Visit: Payer: Self-pay

## 2022-07-30 DIAGNOSIS — R933 Abnormal findings on diagnostic imaging of other parts of digestive tract: Secondary | ICD-10-CM

## 2022-07-30 DIAGNOSIS — R1312 Dysphagia, oropharyngeal phase: Secondary | ICD-10-CM

## 2022-08-04 ENCOUNTER — Ambulatory Visit: Payer: Medicare Other

## 2022-08-04 ENCOUNTER — Telehealth: Payer: Self-pay

## 2022-08-04 DIAGNOSIS — Z8249 Family history of ischemic heart disease and other diseases of the circulatory system: Secondary | ICD-10-CM | POA: Diagnosis not present

## 2022-08-04 DIAGNOSIS — G9341 Metabolic encephalopathy: Secondary | ICD-10-CM | POA: Diagnosis not present

## 2022-08-04 DIAGNOSIS — F17290 Nicotine dependence, other tobacco product, uncomplicated: Secondary | ICD-10-CM | POA: Diagnosis not present

## 2022-08-04 DIAGNOSIS — R569 Unspecified convulsions: Secondary | ICD-10-CM | POA: Diagnosis not present

## 2022-08-04 DIAGNOSIS — E871 Hypo-osmolality and hyponatremia: Secondary | ICD-10-CM | POA: Diagnosis not present

## 2022-08-04 DIAGNOSIS — F17210 Nicotine dependence, cigarettes, uncomplicated: Secondary | ICD-10-CM | POA: Diagnosis not present

## 2022-08-04 DIAGNOSIS — R29702 NIHSS score 2: Secondary | ICD-10-CM | POA: Diagnosis not present

## 2022-08-04 DIAGNOSIS — N179 Acute kidney failure, unspecified: Secondary | ICD-10-CM | POA: Diagnosis not present

## 2022-08-04 DIAGNOSIS — R29818 Other symptoms and signs involving the nervous system: Secondary | ICD-10-CM | POA: Diagnosis not present

## 2022-08-04 DIAGNOSIS — G8191 Hemiplegia, unspecified affecting right dominant side: Secondary | ICD-10-CM | POA: Diagnosis not present

## 2022-08-04 DIAGNOSIS — N200 Calculus of kidney: Secondary | ICD-10-CM | POA: Diagnosis not present

## 2022-08-04 DIAGNOSIS — I63232 Cerebral infarction due to unspecified occlusion or stenosis of left carotid arteries: Secondary | ICD-10-CM | POA: Diagnosis not present

## 2022-08-04 DIAGNOSIS — G9349 Other encephalopathy: Secondary | ICD-10-CM | POA: Diagnosis not present

## 2022-08-04 DIAGNOSIS — N1832 Chronic kidney disease, stage 3b: Secondary | ICD-10-CM | POA: Diagnosis not present

## 2022-08-04 DIAGNOSIS — D649 Anemia, unspecified: Secondary | ICD-10-CM | POA: Diagnosis not present

## 2022-08-04 DIAGNOSIS — Z833 Family history of diabetes mellitus: Secondary | ICD-10-CM | POA: Diagnosis not present

## 2022-08-04 DIAGNOSIS — I672 Cerebral atherosclerosis: Secondary | ICD-10-CM | POA: Diagnosis not present

## 2022-08-04 DIAGNOSIS — F17200 Nicotine dependence, unspecified, uncomplicated: Secondary | ICD-10-CM | POA: Diagnosis not present

## 2022-08-04 DIAGNOSIS — R1311 Dysphagia, oral phase: Secondary | ICD-10-CM

## 2022-08-04 DIAGNOSIS — I771 Stricture of artery: Secondary | ICD-10-CM | POA: Diagnosis not present

## 2022-08-04 DIAGNOSIS — G40909 Epilepsy, unspecified, not intractable, without status epilepticus: Secondary | ICD-10-CM | POA: Diagnosis present

## 2022-08-04 DIAGNOSIS — Z79899 Other long term (current) drug therapy: Secondary | ICD-10-CM | POA: Diagnosis not present

## 2022-08-04 DIAGNOSIS — F05 Delirium due to known physiological condition: Secondary | ICD-10-CM | POA: Diagnosis not present

## 2022-08-04 DIAGNOSIS — I6389 Other cerebral infarction: Secondary | ICD-10-CM | POA: Diagnosis not present

## 2022-08-04 DIAGNOSIS — I10 Essential (primary) hypertension: Secondary | ICD-10-CM | POA: Diagnosis not present

## 2022-08-04 DIAGNOSIS — I6622 Occlusion and stenosis of left posterior cerebral artery: Secondary | ICD-10-CM | POA: Diagnosis not present

## 2022-08-04 DIAGNOSIS — I63411 Cerebral infarction due to embolism of right middle cerebral artery: Secondary | ICD-10-CM | POA: Diagnosis not present

## 2022-08-04 DIAGNOSIS — I6522 Occlusion and stenosis of left carotid artery: Secondary | ICD-10-CM | POA: Diagnosis not present

## 2022-08-04 DIAGNOSIS — E785 Hyperlipidemia, unspecified: Secondary | ICD-10-CM | POA: Diagnosis not present

## 2022-08-04 DIAGNOSIS — R933 Abnormal findings on diagnostic imaging of other parts of digestive tract: Secondary | ICD-10-CM | POA: Insufficient documentation

## 2022-08-04 DIAGNOSIS — N1831 Chronic kidney disease, stage 3a: Secondary | ICD-10-CM | POA: Diagnosis not present

## 2022-08-04 DIAGNOSIS — I6503 Occlusion and stenosis of bilateral vertebral arteries: Secondary | ICD-10-CM | POA: Diagnosis not present

## 2022-08-04 DIAGNOSIS — R1312 Dysphagia, oropharyngeal phase: Secondary | ICD-10-CM | POA: Insufficient documentation

## 2022-08-04 DIAGNOSIS — I129 Hypertensive chronic kidney disease with stage 1 through stage 4 chronic kidney disease, or unspecified chronic kidney disease: Secondary | ICD-10-CM | POA: Diagnosis not present

## 2022-08-04 DIAGNOSIS — R946 Abnormal results of thyroid function studies: Secondary | ICD-10-CM | POA: Diagnosis not present

## 2022-08-04 DIAGNOSIS — I6523 Occlusion and stenosis of bilateral carotid arteries: Secondary | ICD-10-CM | POA: Diagnosis not present

## 2022-08-04 DIAGNOSIS — Z825 Family history of asthma and other chronic lower respiratory diseases: Secondary | ICD-10-CM | POA: Diagnosis not present

## 2022-08-04 DIAGNOSIS — D62 Acute posthemorrhagic anemia: Secondary | ICD-10-CM | POA: Diagnosis not present

## 2022-08-04 DIAGNOSIS — R1310 Dysphagia, unspecified: Secondary | ICD-10-CM | POA: Diagnosis not present

## 2022-08-04 DIAGNOSIS — I639 Cerebral infarction, unspecified: Secondary | ICD-10-CM | POA: Diagnosis not present

## 2022-08-04 DIAGNOSIS — R7989 Other specified abnormal findings of blood chemistry: Secondary | ICD-10-CM | POA: Diagnosis not present

## 2022-08-04 NOTE — Therapy (Signed)
OUTPATIENT SPEECH LANGUAGE PATHOLOGY SWALLOW EVALUATION   Patient Name: Carlos Shields MRN: [id number] DOB:Jan 16, 1931, 89 y.o., male Today's Date: 08/04/2022  PCP: [name] PA REFERRING PROVIDER: Vladimir Crofts, PA-C   END OF SESSION:  End of Session - 08/04/22 1441     Visit Number 1    Number of Visits 1    Authorization Type [employer] Medicare    SLP Start Time 1400    SLP Stop Time  1430    SLP Time Calculation (min) 30 min    Activity Tolerance Patient tolerated treatment well             Past Medical History:  Diagnosis Date   CKD (chronic kidney disease) stage 3, GFR 30-59 ml/min (HCC)    Gait abnormality    Hypertension    Pancreatitis    Seizures (HCC)    Venous insufficiency    Past Surgical History:  Procedure Laterality Date   HERNIA REPAIR     Patient Active Problem List   Diagnosis Date Noted   CKD (chronic kidney disease), stage III ([place]) 04/26/2021   Weakness generalized 04/26/2021   Weakness 04/25/2021   Syncope 01/13/2012   Anemia 12/09/2011   Seizures ([place]) 12/08/2011   HTN (hypertension) 12/08/2011   Encephalopathy 12/08/2011    ONSET DATE: 07/30/22 (referral date)   REFERRING DIAG:  R93.3 (ICD-10-CM) - Abnormal esophagram  R13.12 (ICD-10-CM) - Oropharyngeal dysphagia    THERAPY DIAG: Dysphagia, oral phase  Rationale for Evaluation and Treatment: Rehabilitation  SUBJECTIVE:   SUBJECTIVE STATEMENT: "I'm not getting choked any more"  Pt accompanied by: family member  PERTINENT HISTORY: per MBSS report "Pt is an 89 yo male referred for OP MBS due to concerns for pt having pharyngeal dysphagia. Pt noted to have potential small Zenker's diverticulum. Prior brain imaging showed atrophy, lacunar cva bilateral thalamic, brainstem and small left parietal cva. Pt reports more issues swallowing foods than liquids, sensing lodging - pointing to left side of his throat. He denies requiring heimlich manuever, losing weight unintentionally or  having recurrent pneumonias. Prior h/o ETOH use - ceased many years ago per pt - and pt currently uses cigars per GI note. No family present when xray staff went to retrieve pt for MBS therefore testing completed as ordered."  PAIN: Are you having pain? No  FALLS: Has patient fallen in last 6 months?  Yes, Comment: Pt would benefit from PT  LIVING ENVIRONMENT: Lives with: lives with their family Lives in: House/apartment  PLOF:  Level of assistance: Needed assistance with ADLs, Needed assistance with IADLS Employment: Retired  OBJECTIVE:   RECOMMENDATIONS FROM OBJECTIVE SWALLOW STUDY (MBSS/FEES):  MBSS on 07/22/22  Patient presents with minimal oral difficulties with solids with vertical mastication pattern and requiring 3 swallows to clear small bolus of cracker.     Functional oropharyngeal swallow with thin, nectar,  and pudding.  Pt transited tablet into pharynx without liquid - as was pooled at vallecular space when xray was turned on.  Liquid swallows transited tablet into esophagus.     Suspect UES impaired opening/relaxation preventing tablet from moving distally beyond UES despite several extra boluses of variable consistencies of barium.  This replicated pt's dysphagia symptoms and he never sensed tablet clearance.  Please see image below the test results.    Trace laryngeal penetration of thin noted which is [facility] for his age.  Single episode of penetration of thin to cords via cup x1 due to impaired timing of laryngeal closure.  Cued cough effectively cleared.  Pharyngeal swallow is strong without retention.     As daughter was not present for exam and given pt's advanced age, recommend consider follow up session with HH or OP SLP for dysphagia education.  Thanks for this referral.     Educated pt to test results while testing completed.  SLP advised radiation technologist to advise daughter to tablet halting and need for pt to drink plenty of liquids today and inform daughter  that SLP will review flouro loops and report will be placed in [place] today.   Objective swallow observations: reduced UES opening, piecemeal swallows  Objective recommended compensations: crush meds, slow rate, small bites   COGNITION: Overall cognitive status: Within functional limits for tasks assessed (Denied difficulty; daughter did not report concern)  ORAL MOTOR EXAMINATION: Overall status: WFL  CLINICAL SWALLOW ASSESSMENT:   Current diet: Dysphagia 3 (mechanical soft) and thin liquids (daughter modifying diet slightly to soften textures and cut foods)  Dentition: poor condition Patient directly observed with POs: No (pt politely declined PO trials) Feeding: able to feed self reportedly Oral phase signs and symptoms: prolonged mastication per MBSS Pharyngeal phase signs and symptoms:  reduced UES opening per MBSS   ASSESSMENT:  CLINICAL IMPRESSION: Patient is a 89 y.o. M who was seen today for dysphagia. Today, pt and daughter denied further swallowing difficulty with no recent choking episodes. Denied PNA or weight loss. MBSS on 07/22/22 revealed minimal oral difficulties (poor condition dentition) and reduced UES opening (possible mild Zenker's diverticulum). Age-related penetration noted x1 with strong cough exhibited when cued. Educated patient and his daughter on MBSS results and general safe swallow strategies, such as slow rate, small bites/sips, thorough mastication, and possibly crushing meds if needed/as able. Daughter is softening some textures and cutting his food into smaller pieces, which has aided swallow function. Verbalized understanding of SLP education of MBSS results and subsequent recommendations. Given reported return to baseline and endorsed carryover of safe swallow strategies, no further skilled ST intervention warranted at this time. Pt and daughter aware of ability to request repeat referral if swallow function declines.   PLAN:  SLP FREQUENCY: one time  visit    Marzetta Board, [place] 08/04/2022, 2:42 PM

## 2022-08-05 ENCOUNTER — Inpatient Hospital Stay (HOSPITAL_COMMUNITY)
Admission: EM | Admit: 2022-08-05 | Discharge: 2022-08-14 | DRG: 035 | Disposition: A | Discharge status: Rehabilitation Facility | Payer: Medicare Other | Attending: Internal Medicine | Admitting: Internal Medicine

## 2022-08-05 ENCOUNTER — Observation Stay (HOSPITAL_COMMUNITY): Payer: Medicare Other

## 2022-08-05 ENCOUNTER — Emergency Department (HOSPITAL_COMMUNITY): Payer: Medicare Other

## 2022-08-05 ENCOUNTER — Encounter (HOSPITAL_COMMUNITY): Payer: Self-pay

## 2022-08-05 DIAGNOSIS — F17290 Nicotine dependence, other tobacco product, uncomplicated: Secondary | ICD-10-CM | POA: Diagnosis present

## 2022-08-05 DIAGNOSIS — G9341 Metabolic encephalopathy: Secondary | ICD-10-CM

## 2022-08-05 DIAGNOSIS — I63411 Cerebral infarction due to embolism of right middle cerebral artery: Principal | ICD-10-CM | POA: Diagnosis present

## 2022-08-05 DIAGNOSIS — R569 Unspecified convulsions: Secondary | ICD-10-CM | POA: Diagnosis not present

## 2022-08-05 DIAGNOSIS — N179 Acute kidney failure, unspecified: Secondary | ICD-10-CM | POA: Diagnosis not present

## 2022-08-05 DIAGNOSIS — I6522 Occlusion and stenosis of left carotid artery: Secondary | ICD-10-CM | POA: Diagnosis not present

## 2022-08-05 DIAGNOSIS — I639 Cerebral infarction, unspecified: Secondary | ICD-10-CM

## 2022-08-05 DIAGNOSIS — Z825 Family history of asthma and other chronic lower respiratory diseases: Secondary | ICD-10-CM

## 2022-08-05 DIAGNOSIS — R29702 NIHSS score 2: Secondary | ICD-10-CM | POA: Diagnosis present

## 2022-08-05 DIAGNOSIS — D649 Anemia, unspecified: Secondary | ICD-10-CM | POA: Diagnosis present

## 2022-08-05 DIAGNOSIS — G8191 Hemiplegia, unspecified affecting right dominant side: Secondary | ICD-10-CM | POA: Diagnosis present

## 2022-08-05 DIAGNOSIS — I672 Cerebral atherosclerosis: Secondary | ICD-10-CM | POA: Diagnosis not present

## 2022-08-05 DIAGNOSIS — G40909 Epilepsy, unspecified, not intractable, without status epilepticus: Secondary | ICD-10-CM | POA: Diagnosis present

## 2022-08-05 DIAGNOSIS — R7989 Other specified abnormal findings of blood chemistry: Secondary | ICD-10-CM

## 2022-08-05 DIAGNOSIS — R1310 Dysphagia, unspecified: Secondary | ICD-10-CM | POA: Insufficient documentation

## 2022-08-05 DIAGNOSIS — Z79899 Other long term (current) drug therapy: Secondary | ICD-10-CM

## 2022-08-05 DIAGNOSIS — I10 Essential (primary) hypertension: Secondary | ICD-10-CM | POA: Diagnosis not present

## 2022-08-05 DIAGNOSIS — I6622 Occlusion and stenosis of left posterior cerebral artery: Secondary | ICD-10-CM | POA: Diagnosis not present

## 2022-08-05 DIAGNOSIS — E871 Hypo-osmolality and hyponatremia: Secondary | ICD-10-CM | POA: Diagnosis present

## 2022-08-05 DIAGNOSIS — R29818 Other symptoms and signs involving the nervous system: Secondary | ICD-10-CM | POA: Diagnosis not present

## 2022-08-05 DIAGNOSIS — D62 Acute posthemorrhagic anemia: Secondary | ICD-10-CM | POA: Diagnosis not present

## 2022-08-05 DIAGNOSIS — N1832 Chronic kidney disease, stage 3b: Secondary | ICD-10-CM

## 2022-08-05 DIAGNOSIS — E785 Hyperlipidemia, unspecified: Secondary | ICD-10-CM | POA: Diagnosis present

## 2022-08-05 DIAGNOSIS — N1831 Chronic kidney disease, stage 3a: Secondary | ICD-10-CM | POA: Diagnosis present

## 2022-08-05 DIAGNOSIS — R946 Abnormal results of thyroid function studies: Secondary | ICD-10-CM | POA: Diagnosis present

## 2022-08-05 DIAGNOSIS — F05 Delirium due to known physiological condition: Secondary | ICD-10-CM | POA: Diagnosis not present

## 2022-08-05 DIAGNOSIS — Z8249 Family history of ischemic heart disease and other diseases of the circulatory system: Secondary | ICD-10-CM

## 2022-08-05 DIAGNOSIS — I129 Hypertensive chronic kidney disease with stage 1 through stage 4 chronic kidney disease, or unspecified chronic kidney disease: Secondary | ICD-10-CM | POA: Diagnosis present

## 2022-08-05 DIAGNOSIS — N189 Chronic kidney disease, unspecified: Secondary | ICD-10-CM

## 2022-08-05 DIAGNOSIS — G9349 Other encephalopathy: Secondary | ICD-10-CM | POA: Diagnosis present

## 2022-08-05 DIAGNOSIS — Z833 Family history of diabetes mellitus: Secondary | ICD-10-CM

## 2022-08-05 LAB — COMPREHENSIVE METABOLIC PANEL
ALT: 13 U/L (ref 0–44)
AST: 29 U/L (ref 15–41)
Albumin: 4.3 g/dL (ref 3.5–5.0)
Alkaline Phosphatase: 66 U/L (ref 38–126)
Anion gap: 9 (ref 5–15)
BUN: 36 mg/dL — ABNORMAL HIGH (ref 8–23)
CO2: 23 mmol/L (ref 22–32)
Calcium: 9.1 mg/dL (ref 8.9–10.3)
Chloride: 104 mmol/L (ref 98–111)
Creatinine, Ser: 2.02 mg/dL — ABNORMAL HIGH (ref 0.61–1.24)
GFR, Estimated: 31 mL/min — ABNORMAL LOW (ref >60)
Glucose, Bld: 120 mg/dL — ABNORMAL HIGH (ref 70–99)
Potassium: 4.5 mmol/L (ref 3.5–5.1)
Sodium: 136 mmol/L (ref 135–145)
Total Bilirubin: 1 mg/dL (ref 0.3–1.2)
Total Protein: 7.8 g/dL (ref 6.5–8.1)

## 2022-08-05 LAB — URINALYSIS, ROUTINE W REFLEX MICROSCOPIC
Bilirubin Urine: NEGATIVE
Glucose, UA: NEGATIVE mg/dL
Hgb urine dipstick: NEGATIVE
Ketones, ur: NEGATIVE mg/dL
Leukocytes,Ua: NEGATIVE
Nitrite: NEGATIVE
Protein, ur: NEGATIVE mg/dL
Specific Gravity, Urine: 1.012 (ref 1.005–1.030)
pH: 6 (ref 5.0–8.0)

## 2022-08-05 LAB — DIFFERENTIAL
Abs Immature Granulocytes: 0.01 10*3/uL (ref 0.00–0.07)
Basophils Absolute: 0.1 10*3/uL (ref 0.0–0.1)
Basophils Relative: 1 %
Eosinophils Absolute: 0 10*3/uL (ref 0.0–0.5)
Eosinophils Relative: 0 %
Immature Granulocytes: 0 %
Lymphocytes Relative: 32 %
Lymphs Abs: 2.3 10*3/uL (ref 0.7–4.0)
Monocytes Absolute: 0.8 10*3/uL (ref 0.1–1.0)
Monocytes Relative: 11 %
Neutro Abs: 4 10*3/uL (ref 1.7–7.7)
Neutrophils Relative %: 56 %

## 2022-08-05 LAB — CBC
HCT: 30 % — ABNORMAL LOW (ref 39.0–52.0)
Hemoglobin: 9.5 g/dL — ABNORMAL LOW (ref 13.0–17.0)
MCH: 28.3 pg (ref 26.0–34.0)
MCHC: 31.7 g/dL (ref 30.0–36.0)
MCV: 89.3 fL (ref 80.0–100.0)
Platelets: 187 10*3/uL (ref 150–400)
RBC: 3.36 MIL/uL — ABNORMAL LOW (ref 4.22–5.81)
RDW: 12.4 % (ref 11.5–15.5)
WBC: 7.1 10*3/uL (ref 4.0–10.5)
nRBC: 0 % (ref 0.0–0.2)

## 2022-08-05 LAB — TROPONIN I (HIGH SENSITIVITY): Troponin I (High Sensitivity): 4 ng/L (ref <18)

## 2022-08-05 LAB — I-STAT CHEM 8, ED
BUN: 37 mg/dL — ABNORMAL HIGH (ref 8–23)
Calcium, Ion: 1.16 mmol/L (ref 1.15–1.40)
Chloride: 105 mmol/L (ref 98–111)
Creatinine, Ser: 2.1 mg/dL — ABNORMAL HIGH (ref 0.61–1.24)
Glucose, Bld: 116 mg/dL — ABNORMAL HIGH (ref 70–99)
HCT: 32 % — ABNORMAL LOW (ref 39.0–52.0)
Hemoglobin: 10.9 g/dL — ABNORMAL LOW (ref 13.0–17.0)
Potassium: 4.7 mmol/L (ref 3.5–5.1)
Sodium: 138 mmol/L (ref 135–145)
TCO2: 24 mmol/L (ref 22–32)

## 2022-08-05 LAB — PROTIME-INR
INR: 1.3 — ABNORMAL HIGH (ref 0.8–1.2)
Prothrombin Time: 16 seconds — ABNORMAL HIGH (ref 11.4–15.2)

## 2022-08-05 LAB — RAPID URINE DRUG SCREEN, HOSP PERFORMED
Amphetamines: NOT DETECTED
Barbiturates: NOT DETECTED
Benzodiazepines: NOT DETECTED
Cocaine: NOT DETECTED
Opiates: NOT DETECTED
Tetrahydrocannabinol: NOT DETECTED

## 2022-08-05 LAB — TSH: TSH: 4.922 u[IU]/mL — ABNORMAL HIGH (ref 0.350–4.500)

## 2022-08-05 LAB — APTT: aPTT: 29 seconds (ref 24–36)

## 2022-08-05 LAB — ETHANOL: Alcohol, Ethyl (B): <10 mg/dL (ref <10)

## 2022-08-05 LAB — CBG MONITORING, ED: Glucose-Capillary: 92 mg/dL (ref 70–99)

## 2022-08-05 MED ORDER — ACETAMINOPHEN 160 MG/5ML PO SOLN: 650.0000 mg | ORAL | Status: DC | PRN

## 2022-08-05 MED ORDER — SODIUM CHLORIDE 0.9 % IV BOLUS
500.0000 mL | Freq: Once | INTRAVENOUS | Status: AC
  Administered 2022-08-05: 500 mL via INTRAVENOUS

## 2022-08-05 MED ORDER — LEVETIRACETAM IN NACL 1000 MG/100ML IV SOLN
1000.0000 mg | Freq: Once | INTRAVENOUS | Status: AC
  Administered 2022-08-05: 1000 mg via INTRAVENOUS
  Filled 2022-08-05: qty 100

## 2022-08-05 MED ORDER — ASPIRIN 81 MG PO TBEC
81.0000 mg | DELAYED_RELEASE_TABLET | Freq: Every day | ORAL | Status: DC
  Administered 2022-08-05 – 2022-08-14 (×9): 81 mg via ORAL
  Filled 2022-08-05 (×9): qty 1

## 2022-08-05 MED ORDER — STROKE: EARLY STAGES OF RECOVERY BOOK
Freq: Once | Status: AC
  Administered 2022-08-06: 1
  Filled 2022-08-05: qty 1

## 2022-08-05 MED ORDER — ACETAMINOPHEN 650 MG RE SUPP: 650.0000 mg | RECTAL | Status: DC | PRN

## 2022-08-05 MED ORDER — PANTOPRAZOLE SODIUM 40 MG PO TBEC
40.0000 mg | DELAYED_RELEASE_TABLET | Freq: Every day | ORAL | Status: DC
  Administered 2022-08-06 – 2022-08-14 (×8): 40 mg via ORAL
  Filled 2022-08-05 (×8): qty 1

## 2022-08-05 MED ORDER — ENOXAPARIN SODIUM 30 MG/0.3ML IJ SOSY
30.0000 mg | PREFILLED_SYRINGE | INTRAMUSCULAR | Status: DC
  Administered 2022-08-05 – 2022-08-11 (×7): 30 mg via SUBCUTANEOUS
  Filled 2022-08-05 (×7): qty 0.3

## 2022-08-05 MED ORDER — LEVETIRACETAM 500 MG PO TABS
500.0000 mg | ORAL_TABLET | Freq: Two times a day (BID) | ORAL | Status: DC
  Administered 2022-08-05 – 2022-08-14 (×17): 500 mg via ORAL
  Filled 2022-08-05 (×17): qty 1

## 2022-08-05 MED ORDER — ACETAMINOPHEN 325 MG PO TABS: 650.0000 mg | ORAL_TABLET | ORAL | Status: DC | PRN

## 2022-08-05 NOTE — ED Provider Notes (Signed)
  Physical Exam  BP 136/76   Pulse 74   Temp 98.1 F (36.7 C)   Resp 14   SpO2 100%   Physical Exam  Procedures  Procedures  ED Course / MDM    Medical Decision Making Care assumed at 3 PM.  Patient is here with strokelike symptoms with right-sided weakness and trouble speaking.  Symptoms onset is about 24 hours.  Code stroke was activated by previous provider and seen by telemetry neurologist, Dr. Leonel Ramsay.  He recommend MRI brain.  7:49 PM Patient has acute renal failure.  MRI showed multiple acute strokes.  I notified Dr. Leonel Ramsay.  He will be transferred to [facility] under the hospitalist service and neurology to follow.  Amount and/or Complexity of Data Reviewed Labs: ordered. Decision-making details documented in ED Course. Radiology: ordered and independent interpretation performed. Decision-making details documented in ED Course.  Risk Decision regarding hospitalization.          Drenda Freeze, MD 08/05/22 [phone]

## 2022-08-05 NOTE — Assessment & Plan Note (Signed)
-  Creatinine elevated at 2.02 from prior of 1.90 -Will give fluid bolus and follow repeat in the morning

## 2022-08-05 NOTE — Assessment & Plan Note (Addendum)
-  continue Keppra 500mg  BID -symptoms most likely due to stroke with new MRI finding

## 2022-08-05 NOTE — ED Triage Notes (Signed)
Pt arrived via POV with family. Pt daughter states pt started with right sided weakness. LKW 1130am-12pm today. Per daughter pt has hx of stroke, but no deficit from such. Was unable to ambulate to baseline. Right sided grip strength lesser than left.

## 2022-08-05 NOTE — H&P (Signed)
History and Physical    Patient: Carlos Shields EZM:[id number] DOB: 1930/08/31 DOA: 08/05/2022 DOS: the patient was seen and examined on 08/05/2022 PCP: [name], PA  Patient coming from: Home  Chief Complaint:  Chief Complaint  Patient presents with   Code Stroke   Seizures   HPI: Carlos Shields is a 89 y.o. male with medical history significant of seizure on antiepileptics, CVA, hypertension, CKD stage IIIa, dysphagia who presents with right-sided weakness.  Patient's daughter provides history since pt was alert and oriented to self and was halfway falling out of bed during my evaluation. Pt stays with his daughter due to hx of fall. Normally ambulate with walker. Today he wet his bed which is unusual for him. Daughter then saw him falling out of bed and seems to be dragging his leg. Right hand appears "twisted" so daughter thought he was having a seizure and called EMS. All of this happened around 1-2pm. He had missed a few doses of his Keppra due to issues with pharmacy and ran out aspirin for a week. Son also just died last week.   Code stroke initially activated at 1426 with negative CT head. No TPA or thrombectomy as he was outside window. Neurology Dr. Leonel Ramsay evaluated over tele-neuro and MRI brain was recommended. Additional Keppra was given as pt had missed several doses and concerned this could be breakthrough seizure. However MRI brain was later revealing for multiple small acute infarcts involving the left basal ganglia, left frontal, parietal and occipital cortex and adjacent white matter. Slight edema without mass effect.   Pt was requested to be transferred to [facility] for neurology consult and full stroke workup.   Review of Systems: unable to review all systems due to the inability of the patient to answer questions. Past Medical History:  Diagnosis Date   CKD (chronic kidney disease) stage 3, GFR 30-59 ml/min (HCC)    Gait abnormality    Hypertension     Pancreatitis    Seizures (HCC)    Venous insufficiency    Past Surgical History:  Procedure Laterality Date   HERNIA REPAIR     Social History:  reports that he has been smoking cigars. He has never used smokeless tobacco. He reports that he does not drink alcohol and does not use drugs.  No Known Allergies  Family History  Problem Relation Age of Onset   Hypertension Other    Diabetes Other    Asthma Other     Prior to Admission medications   Medication Sig Start Date End Date Taking? Authorizing Provider  levETIRAcetam (KEPPRA) 500 MG tablet Take 1 tablet (500 mg total) by mouth 2 (two) times daily. 05/04/13   Kinnie Feil, MD  metoprolol tartrate (LOPRESSOR) 50 MG tablet Take 50 mg by mouth daily. 10/11/16   [provider]  multivitamin (ONE-A-DAY MEN'S) TABS tablet Take 1 tablet by mouth daily.    [provider]  pantoprazole (PROTONIX) 40 MG tablet Take 1 tablet (40 mg total) by mouth daily. 06/16/22   Vladimir Crofts, PA-C    Physical Exam: Vitals:   08/05/22 1837 08/05/22 1838 08/05/22 2055 08/05/22 2100  BP: 136/76 136/76 120/74   Pulse:  74 81   Resp:  14 17   Temp:  98.1 F (36.7 C)    TempSrc:      SpO2:  100% 100%   Weight:    51.7 kg   Constitutional: NAD, elderly gentleman found halfway out of bed laying  on his abdomen with legs dangling onto the side of bed and had urinated on himself.  Alert and oriented only to his name and unable to answer the questions.  Able to follow commands. eyes:  lids and conjunctivae normal ENMT: Mucous membranes are moist.  Neck: normal, supple Respiratory: clear to auscultation bilaterally, no wheezing, no crackles. Normal respiratory effort. No accessory muscle use.  Cardiovascular: Regular rate and rhythm, no murmurs / rubs / gallops. No extremity edema.   Abdomen: no tenderness,  Bowel sounds positive.  Musculoskeletal: no clubbing / cyanosis. No joint deformity upper and lower extremities. Good ROM,  no contractures. Normal muscle tone.  Skin: no rashes, lesions, ulcers. No induration Neurologic: CN 2-12 grossly intact.  Slow drifting of the right upper extremity against gravity with weak handgrip.  5 out of 5 strength of bilateral lower extremity. Psychiatric:  Alert and oriented only to self.    Data Reviewed:  See HPI  Assessment and Plan: * CVA (cerebral vascular accident) ([place]) MRI brain was later revealing for multiple small acute infarcts involving the left basal ganglia, left frontal, parietal and occipital cortex and adjacent white matter. Slight edema without mass effect.  -Distribution of stroke appears to be embolic but no history of atrial fibrillation.  Keep on continuous telemetry. -Patient has history of CVA and missed aspirin for the past week.  Continue aspirin 81 mg - MRA head wo contrast -obtain b/l carotid ultrasound due to AKI - obtain echocardiogram  -Obtain A1c and lipids-not currently on statin -PT/OT/SLT -had speech therapy even prior to stroke and was on dysphagic 3 diet -Frequent neuro checks and keep on telemetry -Allow for permissive hypertension with blood pressure treatment as needed only if systolic goes above 119 -transfer to [facility] for neurology consultation and further recommendations  Acute metabolic encephalopathy -pt oriented to self only and appeared confused on exam. Baseline oriented x 3 per daughter.  -possibly hospital delirium, dehydration with AKI -continue to monitor -fall precaution  Abnormal TSH TSH mildly elevated at 4.922. Will check Free T4.  Acute-on-chronic kidney injury ([place]) -Creatinine elevated at 2.02 from prior of 1.90 -Will give fluid bolus and follow repeat in the morning  Anemia Stable around baseline with hgb of 9.5  HTN (hypertension) -Hold home amlodipine and metoprolol due to allow permissive HTN with acute CVA  Seizures ([place]) -continue Keppra 500mg  BID -symptoms most likely due to stroke with new  MRI finding      Advance Care Planning: Full   Consults: neurology  Family Communication: daughter over the phone  Severity of Illness: The appropriate patient status for this patient is OBSERVATION. Observation status is judged to be reasonable and necessary in order to provide the required intensity of service to ensure the patient's safety. The patient's presenting symptoms, physical exam findings, and initial radiographic and laboratory data in the context of their medical condition is felt to place them at decreased risk for further clinical deterioration. Furthermore, it is anticipated that the patient will be medically stable for discharge from the hospital within 2 midnights of admission.   Author: Orene Desanctis, DO 08/05/2022 10:04 PM  For on call review [email or web address].

## 2022-08-05 NOTE — Assessment & Plan Note (Signed)
Stable around baseline with hgb of 9.5

## 2022-08-05 NOTE — Assessment & Plan Note (Addendum)
-  Hold home amlodipine and metoprolol due to allow permissive HTN with acute CVA

## 2022-08-05 NOTE — ED Provider Notes (Addendum)
Carlos Shields EMERGENCY DEPARTMENT AT [facility] Provider Note   CSN: [id number] Arrival date & time: 08/05/22  1416     History  Chief Complaint  Patient presents with   Code Stroke   Seizures    Carlos Shields is a 89 y.o. male.  Patient is an 89 year old male past medical history of EKG, venous insufficiency, seizure disorder, and previous CVA presenting for strokelike symptoms.  Patient's wife is presenting with him who helps explain details of the history.  States patient began having right upper and right lower extremity weakness that started yesterday.  States patient had difficulty walking, walking off to one side, and dragging his leg at that time. States he does not have residual deficits from previous stroke. On arrival to ED no weakness is noted. Denies any falls or recent head trauma. No blood thinner use.  The history is provided by the patient. No language interpreter was used.       Home Medications Prior to Admission medications   Medication Sig Start Date End Date Taking? Authorizing Provider  levETIRAcetam (KEPPRA) 500 MG tablet Take 1 tablet (500 mg total) by mouth 2 (two) times daily. 05/04/13   [name], MD  metoprolol tartrate (LOPRESSOR) 50 MG tablet Take 50 mg by mouth daily. 10/11/16   [provider]  multivitamin (ONE-A-DAY MEN'S) TABS tablet Take 1 tablet by mouth daily.    [provider]  pantoprazole (PROTONIX) 40 MG tablet Take 1 tablet (40 mg total) by mouth daily. 06/16/22   Doree Albee, PA-C      Allergies    Patient has no known allergies.    Review of Systems   Review of Systems  Constitutional:  Negative for chills and fever.  HENT:  Negative for ear pain and sore throat.   Eyes:  Negative for pain and visual disturbance.  Respiratory:  Negative for cough and shortness of breath.   Cardiovascular:  Negative for chest pain and palpitations.  Gastrointestinal:  Negative for abdominal pain and vomiting.   Genitourinary:  Negative for dysuria and hematuria.  Musculoskeletal:  Negative for arthralgias and back pain.  Skin:  Negative for color change and rash.  Neurological:  Positive for weakness. Negative for seizures and syncope.  All other systems reviewed and are negative.   Physical Exam Updated Vital Signs BP 123/67   Pulse 83   Temp 98.1 F (36.7 C) (Oral)   Resp (!) 22   SpO2 100%  Physical Exam Vitals and nursing note reviewed.  Constitutional:      General: He is not in acute distress.    Appearance: He is well-developed.  HENT:     Head: Normocephalic and atraumatic.  Eyes:     General: Lids are normal. Vision grossly intact.     Conjunctiva/sclera: Conjunctivae normal.     Pupils: Pupils are equal, round, and reactive to light.  Cardiovascular:     Rate and Rhythm: Normal rate and regular rhythm.     Heart sounds: No murmur heard. Pulmonary:     Effort: Pulmonary effort is normal. No respiratory distress.     Breath sounds: Normal breath sounds.  Abdominal:     Palpations: Abdomen is soft.     Tenderness: There is no abdominal tenderness.  Musculoskeletal:        General: No swelling.     Cervical back: Neck supple.  Skin:    General: Skin is warm and dry.     Capillary Refill:  Capillary refill takes less than 2 seconds.  Neurological:     Mental Status: He is alert.     GCS: GCS eye subscore is 4. GCS verbal subscore is 5. GCS motor subscore is 6.     Cranial Nerves: Cranial nerves 2-12 are intact.     Sensory: Sensation is intact.     Motor: Weakness present.     Coordination: Coordination is intact.     Comments: Weakness bilateral upper extremities on grip strength greater on right when compared to left. No lower extremity weakness. Pt able life both legs off the bed and hold against gravity.  Some abnormal speech which appears to be patient's baseline.  Psychiatric:        Mood and Affect: Mood normal.     ED Results / Procedures / Treatments    Labs (all labs ordered are listed, but only abnormal results are displayed) Labs Reviewed  PROTIME-INR - Abnormal; Notable for the following components:      Result Value   Prothrombin Time 16.0 (*)    INR 1.3 (*)    All other components within normal limits  CBC - Abnormal; Notable for the following components:   RBC 3.36 (*)    Hemoglobin 9.5 (*)    HCT 30.0 (*)    All other components within normal limits  COMPREHENSIVE METABOLIC PANEL - Abnormal; Notable for the following components:   Glucose, Bld 120 (*)    BUN 36 (*)    Creatinine, Ser 2.02 (*)    GFR, Estimated 31 (*)    All other components within normal limits  I-STAT CHEM 8, ED - Abnormal; Notable for the following components:   BUN 37 (*)    Creatinine, Ser 2.10 (*)    Glucose, Bld 116 (*)    Hemoglobin 10.9 (*)    HCT 32.0 (*)    All other components within normal limits  APTT  DIFFERENTIAL  ETHANOL  RAPID URINE DRUG SCREEN, HOSP PERFORMED  URINALYSIS, ROUTINE W REFLEX MICROSCOPIC  TSH  CBG MONITORING, ED  CBG MONITORING, ED  TROPONIN I (HIGH SENSITIVITY)    EKG None  Radiology CT HEAD CODE STROKE WO CONTRAST  Result Date: 08/05/2022 CLINICAL DATA:  Code stroke.  Neuro deficit, acute, stroke suspected EXAM: CT HEAD WITHOUT CONTRAST TECHNIQUE: Contiguous axial images were obtained from the base of the skull through the vertex without intravenous contrast. RADIATION DOSE REDUCTION: This exam was performed according to the departmental dose-optimization program which includes automated exposure control, adjustment of the mA and/or kV according to patient size and/or use of iterative reconstruction technique. COMPARISON:  Apr 25, 2021 FINDINGS: Brain: Patchy white matter hypodensities, nonspecific but compatible with chronic microvascular ischemic disease. Similar chronic lacunar infarcts involving the basal ganglia and thalami. No evidence of acute large vascular territory infarct, acute hemorrhage, mass lesion  or midline shift. Vascular: Calcific atherosclerosis. No hyperdense vessel identified. Skull: No acute fracture. Sinuses/Orbits: Clear sinuses.  No acute orbital findings. Other: No mastoid effusions. ASPECTS [facility] Stroke Program Early CT Score) total score (0-10 with 10 being normal): 10. IMPRESSION: 1. No evidence of acute abnormality. ASPECTS is 10. 2. Similar chronic microvascular ischemic disease. Findings discussed with Dr. Pearline Cables via telephone at 2:44 PM. Electronically Signed   By: Margaretha Sheffield M.D.   On: 08/05/2022 14:45    Procedures .Critical Care  Performed by: Lianne Cure, DO Authorized by: Lianne Cure, DO   Critical care provider statement:    Critical care  time (minutes):  75   Critical care was necessary to treat or prevent imminent or life-threatening deterioration of the following conditions: stroke like symptoms.   Critical care was time spent personally by me on the following activities:  Development of treatment plan with patient or surrogate, discussions with consultants, evaluation of patient's response to treatment, examination of patient, ordering and review of laboratory studies, ordering and review of radiographic studies, ordering and performing treatments and interventions, pulse oximetry, re-evaluation of patient's condition and review of old charts     Medications Ordered in ED Medications  levETIRAcetam (KEPPRA) IVPB 1000 mg/100 mL premix (1,000 mg Intravenous New Bag/Given 08/05/22 1526)    ED Course/ Medical Decision Making/ A&P                             Medical Decision Making Amount and/or Complexity of Data Reviewed Labs: ordered. Radiology: ordered.   26:39 PM   89 year old male past medical history of EKG, venous insufficiency, seizure disorder, and previous CVA presenting for strokelike symptoms.  Patient seen by triage team where a stroke activation was started.  Patient with teleneurology at this time. Patient has some bilateral  upper ext weakness in grip strength but no focal weakness. Patient is able to lift his right leg off the bed and hold it against gravity at this time.  Patient does have speech abnormalities which appears to be patient's baseline.  Family states he is currently going through speech therapy.  Imaging pending at this time.  NIH stroke scale: 0  TPA No Patient will not receive TPA for the following reason(s): -Did not meet the time window for tPA -Had improvement or resolution of symptoms  Thorough discussion had with patient and/or family/guardian on risks/benefits of tPA and why patient is not a candidate for thrombolytic therapy at this time. tPA will not be given to this patient. ASA will be administered if there are no contraindications or allergies.  3:30 PM CT head demonstrates no acute process. CTA head and neck will due to patient's renal function. MRI pending.  Patient signed out to oncoming provider while awaiting complete workup.         Final Clinical Impression(s) / ED Diagnoses Final diagnoses:  Stroke-like symptoms    Rx / DC Orders ED Discharge Orders     None         Lianne Cure, DO 00/93/81 8299    Campbell Stall P, DO 37/16/96 1533

## 2022-08-05 NOTE — ED Provider Triage Note (Signed)
Emergency Medicine Provider Triage Evaluation Note  Carlos Shields , a 89 y.o. male  was evaluated in triage.  Pt accompanied by family member presenting to the ED with acute neurodeficits to the right upper and lower extremity starting at 11 AM-12 PM today.  Family member noticed patient attempting to walk with difficulty, dragging right leg, and not walking in a straight line.  Also noticed discoordination of the arm and leg.  Hx of gait instability though this appeared significantly different, and suddenly.  Not on anticoagulation.  Took seizure medication today, recently went a "few" days without it.  Per record review, evidence of previous infarcts appreciated on imaging of head.  Per family member, no history of weakness or prior deficits.  Review of Systems  Positive:  Negative: See above  Physical Exam  BP 126/68   Pulse 98   Temp 98.1 F (36.7 C) (Oral)   Resp 15   SpO2 93%  Gen:   Awake, no distress   Resp:  Normal effort  MSK:   Moves extremities without difficulty  Other:  Mild facial asymmetry that significantly lessens with purposeful face movements.  Significant weakness of RUE and RLE.  Discoordination of RUE.  Patient exhibits difficulty participating with EOMs.  Gaze overall aligned appropriately and EOMs grossly intact to providers face.  Decreased strength of RUE and RLE on exam.  Decreased shoulder shrug of right side.  Gait deferred for safety of patient.  Medical Decision Making  Medically screening exam initiated at 2:29 PM.  Appropriate orders placed.  [name] was informed that the remainder of the evaluation will be completed by another provider, this initial triage assessment does not replace that evaluation, and the importance of remaining in the ED until their evaluation is complete.  CBG 92  Patient meets acute code stroke criteria and is within the window, code stroke initiated.  Attending Dr. Pearline Cables notified.   Prince Rome, PA-C 40/97/35 1433

## 2022-08-05 NOTE — Consult Note (Signed)
[facility] Telemedicine Consult   Requesting Provider: Shawna Orleans Consult Participants: Daughter Location of the provider: [facility] Location of the patient: [facility]  This consult was provided via telemedicine with 2-way video and audio communication. The patient/family was informed that care would be provided in this way and agreed to receive care in this manner.    Chief Complaint: Speech difficulty  HPI: 89 year old male who presents with right-sided weakness and speech difficulty. After he a took his seizure medicine at 7pm last night, he was noted to be slumping over some. This morning, he was noted to be dragging his right leg on awakening.   Of note, he missed a few doses of his Keppra because he had run out and had trouble getting a prescription from his physician.  He got it refilled and took a dose last night and then again this morning.  Today, he had a an episode where his right side was shaking, causing his whole body to shake that lasted for approximately 10 minutes.  This is what prompted the daughter to bring him into the emergency department.  His last seizure was 2 years ago.    At baseline, he is able to walk with a walker.  His daughter picks out close, but he is able to put them on.  He is able to eat food with a spoon, but if the knife and fork are needed his daughter does that for him.  His daughter has power of attorney over his finances.   LKW: 1/24 4pm  tpa given?: No, outside of window IR Thrombectomy? No,  Modified Rankin Scale: 4-Needs assistance to walk and tend to bodily needs Time of teleneurologist evaluation: 2:33 PM  Exam: Vitals:   08/05/22 1422  BP: 126/68  Pulse: 98  Resp: 15  Temp: 98.1 F (36.7 C)  SpO2: 93%    General: In bed, NAD  1A: Level of Consciousness - 0 1B: Ask Month and Age - 1 1C: 'Blink Eyes' & 'Squeeze Hands' -  2: Test Horizontal Extraocular Movements - 0 3: Test Visual Fields - 0 4: Test Facial Palsy - 1 5A: Test  Left Arm Motor Drift - 0 5B: Test Right Arm Motor Drift - 1 6A: Test Left Leg Motor Drift - 0 6B: Test Right Leg Motor Drift - 0 7: Test Limb Ataxia - 1(right arm) 8: Test Sensation - 0 9: Test Language/Aphasia- 0 10: Test Dysarthria - 0 11: Test Extinction/Inattention - 0 NIHSS score: 4   Imaging Reviewed: CT head-negative  Labs reviewed in epic and pertinent values follow: Creatinine 2.02 with a BUN of 36   Assessment: 89 year old male with a history of seizures who presents with what I suspect was a seizure.  He has been more lethargic over the past day, and I wonder if this could be related to his AKI.  He has missed several doses of his medications as well and it sounds like he had a breakthrough seizure.  I will give a little additional Keppra just in case he is subtherapeutic due to missing these doses.  If he were to return to baseline and MRI is negative, could consider with breakthrough seizure as the diagnosis, but if he continues to have symptoms, I would favor admission to [facility] in case he does not up needing continuous EEG.  If MRI is positive for stroke, then he would need to be admitted to [facility] for stroke workup.  Recommendations:  1) MRI brain 2) resume home Keppra 500 mg  twice daily 3) stroke workup only if MRI is positive 4) neurology will follow if admitted  Roland Rack, MD [facility] [phone]  If 7pm- 7am, please page neurology on call as listed in [place].

## 2022-08-05 NOTE — Assessment & Plan Note (Addendum)
MRI brain was later revealing for multiple small acute infarcts involving the left basal ganglia, left frontal, parietal and occipital cortex and adjacent white matter. Slight edema without mass effect.  -Distribution of stroke appears to be embolic but no history of atrial fibrillation.  Keep on continuous telemetry. -Patient has history of CVA and missed aspirin for the past week.  Continue aspirin 81 mg - MRA head wo contrast -obtain b/l carotid ultrasound due to AKI - obtain echocardiogram  -Obtain A1c and lipids-not currently on statin -PT/OT/SLT -had speech therapy even prior to stroke and was on dysphagic 3 diet -Frequent neuro checks and keep on telemetry -Allow for permissive hypertension with blood pressure treatment as needed only if systolic goes above 027 -transfer to [facility] for neurology consultation and further recommendations

## 2022-08-05 NOTE — Assessment & Plan Note (Signed)
TSH mildly elevated at 4.922. Will check Free T4.

## 2022-08-05 NOTE — Consult Note (Signed)
Code stroke cart activated at 1426. EDP already assessed pt prior to activation.   Left for CT at 1431 and returned at 1444.  Paged neuro at 87. Dr Leonel Ramsay on camera at 1432 while pt in CT. Dr Leonel Ramsay speaking with family/POA at this time.

## 2022-08-05 NOTE — Assessment & Plan Note (Signed)
-  pt oriented to self only and appeared confused on exam. Baseline oriented x 3 per daughter.  -possibly hospital delirium, dehydration with AKI -continue to monitor -fall precaution

## 2022-08-06 ENCOUNTER — Observation Stay (HOSPITAL_COMMUNITY): Payer: Medicare Other

## 2022-08-06 ENCOUNTER — Other Ambulatory Visit: Payer: Self-pay

## 2022-08-06 DIAGNOSIS — R32 Unspecified urinary incontinence: Secondary | ICD-10-CM | POA: Diagnosis not present

## 2022-08-06 DIAGNOSIS — Z79899 Other long term (current) drug therapy: Secondary | ICD-10-CM | POA: Diagnosis not present

## 2022-08-06 DIAGNOSIS — R946 Abnormal results of thyroid function studies: Secondary | ICD-10-CM | POA: Diagnosis present

## 2022-08-06 DIAGNOSIS — I639 Cerebral infarction, unspecified: Secondary | ICD-10-CM | POA: Diagnosis present

## 2022-08-06 DIAGNOSIS — N179 Acute kidney failure, unspecified: Secondary | ICD-10-CM | POA: Diagnosis present

## 2022-08-06 DIAGNOSIS — I6340 Cerebral infarction due to embolism of unspecified cerebral artery: Secondary | ICD-10-CM | POA: Diagnosis not present

## 2022-08-06 DIAGNOSIS — I6389 Other cerebral infarction: Secondary | ICD-10-CM

## 2022-08-06 DIAGNOSIS — G9341 Metabolic encephalopathy: Secondary | ICD-10-CM | POA: Diagnosis not present

## 2022-08-06 DIAGNOSIS — R569 Unspecified convulsions: Secondary | ICD-10-CM | POA: Diagnosis not present

## 2022-08-06 DIAGNOSIS — E785 Hyperlipidemia, unspecified: Secondary | ICD-10-CM | POA: Diagnosis present

## 2022-08-06 DIAGNOSIS — I63212 Cerebral infarction due to unspecified occlusion or stenosis of left vertebral arteries: Secondary | ICD-10-CM | POA: Diagnosis not present

## 2022-08-06 DIAGNOSIS — I6359 Cerebral infarction due to unspecified occlusion or stenosis of other cerebral artery: Secondary | ICD-10-CM | POA: Diagnosis not present

## 2022-08-06 DIAGNOSIS — Z8249 Family history of ischemic heart disease and other diseases of the circulatory system: Secondary | ICD-10-CM | POA: Diagnosis not present

## 2022-08-06 DIAGNOSIS — I63232 Cerebral infarction due to unspecified occlusion or stenosis of left carotid arteries: Secondary | ICD-10-CM | POA: Diagnosis not present

## 2022-08-06 DIAGNOSIS — G8191 Hemiplegia, unspecified affecting right dominant side: Secondary | ICD-10-CM | POA: Diagnosis present

## 2022-08-06 DIAGNOSIS — F17290 Nicotine dependence, other tobacco product, uncomplicated: Secondary | ICD-10-CM | POA: Diagnosis present

## 2022-08-06 DIAGNOSIS — G40909 Epilepsy, unspecified, not intractable, without status epilepticus: Secondary | ICD-10-CM | POA: Diagnosis present

## 2022-08-06 DIAGNOSIS — D62 Acute posthemorrhagic anemia: Secondary | ICD-10-CM | POA: Diagnosis not present

## 2022-08-06 DIAGNOSIS — I6523 Occlusion and stenosis of bilateral carotid arteries: Secondary | ICD-10-CM | POA: Diagnosis not present

## 2022-08-06 DIAGNOSIS — I63411 Cerebral infarction due to embolism of right middle cerebral artery: Secondary | ICD-10-CM | POA: Diagnosis present

## 2022-08-06 DIAGNOSIS — R7989 Other specified abnormal findings of blood chemistry: Secondary | ICD-10-CM | POA: Diagnosis not present

## 2022-08-06 DIAGNOSIS — E871 Hypo-osmolality and hyponatremia: Secondary | ICD-10-CM | POA: Diagnosis present

## 2022-08-06 DIAGNOSIS — I10 Essential (primary) hypertension: Secondary | ICD-10-CM | POA: Diagnosis not present

## 2022-08-06 DIAGNOSIS — Z833 Family history of diabetes mellitus: Secondary | ICD-10-CM | POA: Diagnosis not present

## 2022-08-06 DIAGNOSIS — D649 Anemia, unspecified: Secondary | ICD-10-CM | POA: Diagnosis not present

## 2022-08-06 DIAGNOSIS — F05 Delirium due to known physiological condition: Secondary | ICD-10-CM | POA: Diagnosis not present

## 2022-08-06 DIAGNOSIS — I6522 Occlusion and stenosis of left carotid artery: Secondary | ICD-10-CM | POA: Diagnosis present

## 2022-08-06 DIAGNOSIS — I129 Hypertensive chronic kidney disease with stage 1 through stage 4 chronic kidney disease, or unspecified chronic kidney disease: Secondary | ICD-10-CM | POA: Diagnosis present

## 2022-08-06 DIAGNOSIS — R29702 NIHSS score 2: Secondary | ICD-10-CM | POA: Diagnosis present

## 2022-08-06 DIAGNOSIS — I6503 Occlusion and stenosis of bilateral vertebral arteries: Secondary | ICD-10-CM | POA: Diagnosis not present

## 2022-08-06 DIAGNOSIS — N200 Calculus of kidney: Secondary | ICD-10-CM | POA: Diagnosis not present

## 2022-08-06 DIAGNOSIS — I771 Stricture of artery: Secondary | ICD-10-CM | POA: Diagnosis not present

## 2022-08-06 DIAGNOSIS — R1312 Dysphagia, oropharyngeal phase: Secondary | ICD-10-CM | POA: Diagnosis not present

## 2022-08-06 DIAGNOSIS — I69391 Dysphagia following cerebral infarction: Secondary | ICD-10-CM | POA: Diagnosis not present

## 2022-08-06 DIAGNOSIS — G9349 Other encephalopathy: Secondary | ICD-10-CM | POA: Diagnosis present

## 2022-08-06 DIAGNOSIS — R1310 Dysphagia, unspecified: Secondary | ICD-10-CM | POA: Diagnosis present

## 2022-08-06 DIAGNOSIS — I69351 Hemiplegia and hemiparesis following cerebral infarction affecting right dominant side: Secondary | ICD-10-CM | POA: Diagnosis not present

## 2022-08-06 DIAGNOSIS — N1831 Chronic kidney disease, stage 3a: Secondary | ICD-10-CM | POA: Diagnosis present

## 2022-08-06 DIAGNOSIS — N1832 Chronic kidney disease, stage 3b: Secondary | ICD-10-CM | POA: Diagnosis not present

## 2022-08-06 DIAGNOSIS — F17210 Nicotine dependence, cigarettes, uncomplicated: Secondary | ICD-10-CM | POA: Diagnosis not present

## 2022-08-06 DIAGNOSIS — Z825 Family history of asthma and other chronic lower respiratory diseases: Secondary | ICD-10-CM | POA: Diagnosis not present

## 2022-08-06 LAB — LIPID PANEL
Cholesterol: 143 mg/dL (ref 0–200)
HDL: 69 mg/dL (ref >40)
LDL Cholesterol: 69 mg/dL (ref 0–99)
Total CHOL/HDL Ratio: 2.1 RATIO
Triglycerides: 27 mg/dL (ref <150)
VLDL: 5 mg/dL (ref 0–40)

## 2022-08-06 LAB — ECHOCARDIOGRAM COMPLETE
AR max vel: 2.36 cm2
AV Area VTI: 2.3 cm2
AV Area mean vel: 2.36 cm2
AV Mean grad: 3 mmHg
AV Peak grad: 6.7 mmHg
Ao pk vel: 1.29 m/s
Area-P 1/2: 1.98 cm2
Weight: 1823.65 oz

## 2022-08-06 LAB — HEMOGLOBIN A1C
Hgb A1c MFr Bld: 5.3 % (ref 4.8–5.6)
Mean Plasma Glucose: 105.41 mg/dL

## 2022-08-06 LAB — T4, FREE: Free T4: 0.88 ng/dL (ref 0.61–1.12)

## 2022-08-06 MED ORDER — SODIUM CHLORIDE 0.9 % IV SOLN: INTRAVENOUS | Status: AC

## 2022-08-06 NOTE — Progress Notes (Signed)
Carotid duplex bilateral study completed.   Please see CV Proc for preliminary results.   Olyn Landstrom, RDMS, RVT  

## 2022-08-06 NOTE — ED Notes (Signed)
[facility] called. 

## 2022-08-06 NOTE — Progress Notes (Signed)
Pt admitted to 3W18; Alert and oriented to self, situation, and place; disoriented to time.  Placed tele on patient, CCMD called and second verification completed.  Bed alarm set, call bell within reach and patient verbalizes understanding to call before attempting to get out bed, however, he did attempt once to "turn off his tv."

## 2022-08-06 NOTE — Evaluation (Addendum)
Occupational Therapy Evaluation Patient Details Name: Carlos Shields MRN: [id number] DOB: 1931/02/24 Today's Date: 08/06/2022   History of Present Illness Pt is 89 yo male admitted 08/05/22 with R sided weakness found to have small acute infarcts  involving the left basal ganglia, left frontal, parietal and occipital cortex and adjacent white matter.  Pt with hx of seizure on antiepileptics, CVA, hypertension, CKD stage IIIa, dysphagia   Clinical Impression   Carlos Shields is an 89 year old man who presents with right sided deficits, impaired balance and mild speech deficits. He is hard to understand at times with a soft voice. He is requiring min assist to ambulate with an initial posterior bias, short shuffling gait and a narrow base of support. He exhibited decreased coordination of right dominant upper extremity. He tested strong but his arm buckled on edge of bed. He reports he lives with his daughter, has been independent with ADLs and that his daughter does work. He reports he's been needing to feed himself with a spoon and states unsure of how long. Will need to  verify PLOF with daughter, I think. Patient will benefit from skilled OT services while in hospital to improve deficits and learn compensatory strategies as needed in order to return to PLOF.       Recommendations for follow up therapy are one component of a multi-disciplinary discharge planning process, led by the attending physician.  Recommendations may be updated based on patient status, additional functional criteria and insurance authorization.   Follow Up Recommendations  Acute inpatient rehab (3hours/day)     Assistance Recommended at Discharge Frequent or constant Supervision/Assistance  Patient can return home with the following A little help with walking and/or transfers;A lot of help with bathing/dressing/bathroom;Assistance with cooking/housework;Direct supervision/assist for medications management;Assist for  transportation;Help with stairs or ramp for entrance;Direct supervision/assist for financial management    Functional Status Assessment  Patient has had a recent decline in their functional status and demonstrates the ability to make significant improvements in function in a reasonable and predictable amount of time.  Equipment Recommendations  Other (comment) (TBD)    Recommendations for Other Services       Precautions / Restrictions Precautions Precautions: Fall Precaution Comments: incontinent Restrictions Weight Bearing Restrictions: No      Mobility Bed Mobility Overal bed mobility: Needs Assistance Bed Mobility: Supine to Sit     Supine to sit: Mod assist     General bed mobility comments: increased time with assist to lift trunk and scoot forward    Transfers Overall transfer level: Needs assistance Equipment used: Rolling walker (2 wheels) Transfers: Sit to/from Stand, Bed to chair/wheelchair/BSC Sit to Stand: Mod assist Stand pivot transfers: Mod assist         General transfer comment: Sit to stand x 3 with cues for feet placement (feet blocked) and mod A to rise with posterior tendency. Able to ambulate in hallway with walker - improving with practice. Exhibits short shuffling setps and narrow base of support      Balance Overall balance assessment: Needs assistance Sitting-balance support: No upper extremity supported, Feet supported Sitting balance-Leahy Scale: Fair     Standing balance support: Bilateral upper extremity supported Standing balance-Leahy Scale: Poor Standing balance comment: Requiring RW; initial tendency to posterior lean requiring feet blocked to stand, cues to get feeet under once standing, and cues to tuck buttock  ADL either performed or assessed with clinical judgement   ADL Overall ADL's : Needs assistance/impaired Eating/Feeding: Set up;Sitting   Grooming: Set up;Sitting   Upper  Body Bathing: Minimal assistance;Sitting   Lower Body Bathing: Sit to/from stand;Moderate assistance   Upper Body Dressing : Sitting;Minimal assistance   Lower Body Dressing: Sit to/from stand;Maximal assistance   Toilet Transfer: Minimal assistance;Rolling walker (2 wheels);Ambulation;Grab bars   Toileting- Clothing Manipulation and Hygiene: Moderate assistance;Sit to/from stand Toileting - Clothing Manipulation Details (indicate cue type and reason): for clothing     Functional mobility during ADLs: Minimal assistance;Rolling walker (2 wheels)       Vision   Vision Assessment?: No apparent visual deficits     Perception     Praxis      Pertinent Vitals/Pain Pain Assessment Pain Assessment: No/denies pain     Hand Dominance Right   Extremity/Trunk Assessment Upper Extremity Assessment Upper Extremity Assessment: RUE deficits/detail;LUE deficits/detail RUE Deficits / Details: WFL ROM, grossly 4+/5 strength with testing but UE gave away when supporting himself at edge of bed , no apparent sensation deficits, decreased FMC and ataxia with finger to nose. Has a tendencey to keep in an elbow flexed position. Tests RUE Sensation: WNL RUE Coordination: decreased fine motor;decreased gross motor   Lower Extremity Assessment Lower Extremity Assessment: Defer to PT evaluation RLE Deficits / Details: ROM WFL; MMT 4+/5 throughout RLE Sensation: WNL RLE Coordination: decreased gross motor;decreased fine motor (decreased coordination with heel shin and foot taps) LLE Deficits / Details: ROM WFL; MMT 5/5 throughout LLE Sensation: WNL LLE Coordination: WNL   Cervical / Trunk Assessment Cervical / Trunk Assessment: Kyphotic   Communication Communication Communication: No difficulties   Cognition Arousal/Alertness: Awake/alert Behavior During Therapy: WFL for tasks assessed/performed Overall Cognitive Status: No family/caregiver present to determine baseline cognitive  functioning Area of Impairment: Awareness, Safety/judgement, Orientation, Problem solving                 Orientation Level: Disoriented to, Time       Safety/Judgement: Decreased awareness of safety, Decreased awareness of deficits Awareness: Emergent Problem Solving: Slow processing, Requires verbal cues, Requires tactile cues       General Comments  VSS    Exercises     Shoulder Instructions      Home Living Family/patient expects to be discharged to:: Private residence Living Arrangements: Children Available Help at Discharge: Family;Available 24 hours/day Type of Home: House Home Access: Level entry     Home Layout: One level     Bathroom Shower/Tub: Teacher, early years/pre: Standard     Home Equipment: Tub bench;Rolling Walker (2 wheels);Cane - single point;Cane - quad;BSC/3in1   Additional Comments: Pt somewhat questionable historian but information mostly agrees with prior admissions  Lives With: Daughter (reported to staff he lives with grandson)    Prior Functioning/Environment Prior Level of Function : Independent/Modified Independent             Mobility Comments: Ambulatory with RW; reports could walk in community ADLs Comments: Reports independent with ADLs        OT Problem List: Decreased strength;Decreased range of motion;Decreased activity tolerance;Impaired balance (sitting and/or standing);Decreased coordination;Decreased cognition;Decreased safety awareness;Decreased knowledge of use of DME or AE;Impaired UE functional use      OT Treatment/Interventions: Self-care/ADL training;Therapeutic exercise;Neuromuscular education;DME and/or AE instruction;Therapeutic activities;Balance training;Patient/family education    OT Goals(Current goals can be found in the care plan section) Acute Rehab OT Goals Patient Stated Goal: did  not state OT Goal Formulation: With patient Time For Goal Achievement: 08/20/22 Potential to  Achieve Goals: Good  OT Frequency: Min 2X/week    Co-evaluation PT/OT/SLP Co-Evaluation/Treatment: Yes (coeval) Reason for Co-Treatment: Complexity of the patient's impairments (multi-system involvement);For patient/therapist safety (ED on stretcher) PT goals addressed during session: Mobility/safety with mobility OT goals addressed during session: ADL's and self-care      AM-PAC OT "6 Clicks" Daily Activity     Outcome Measure Help from another person eating meals?: A Little Help from another person taking care of personal grooming?: A Little Help from another person toileting, which includes using toliet, bedpan, or urinal?: A Lot Help from another person bathing (including washing, rinsing, drying)?: A Lot Help from another person to put on and taking off regular upper body clothing?: A Little Help from another person to put on and taking off regular lower body clothing?: A Lot 6 Click Score: 15   End of Session Equipment Utilized During Treatment: Rolling walker (2 wheels);Gait belt Nurse Communication: Mobility status  Activity Tolerance: Patient tolerated treatment well Patient left: in bed;with call bell/phone within reach;with bed alarm set  OT Visit Diagnosis: Hemiplegia and hemiparesis Hemiplegia - Right/Left: Right Hemiplegia - dominant/non-dominant: Dominant Hemiplegia - caused by: Cerebral infarction                Time: 1203-1228 OT Time Calculation (min): 25 min Charges:  OT General Charges $OT Visit: 1 Visit OT Evaluation $OT Eval Low Complexity: 1 Low  Donnella Sham, OTR/L Acute Care Rehab Services  Office [phone]   Kelli Churn 08/06/2022, 4:13 PM

## 2022-08-06 NOTE — Evaluation (Addendum)
Speech Language Pathology Evaluation Patient Details Name: Carlos Shields MRN: [id number] DOB: 1930-10-10 Today's Date: 08/06/2022 Time: 1350-1406 SLP Time Calculation (min) (ACUTE ONLY): 16 min  Problem List:  Patient Active Problem List   Diagnosis Date Noted   Acute-on-chronic kidney injury ([place]) 08/05/2022   CVA (cerebral vascular accident) ([place]) 08/05/2022   Dysphagia 08/05/2022   Abnormal TSH 73/41/9379   Acute metabolic encephalopathy 02/40/9735   CKD (chronic kidney disease), stage III ([place]) 04/26/2021   Weakness generalized 04/26/2021   Weakness 04/25/2021   Syncope 01/13/2012   Anemia 12/09/2011   Seizures ([place]) 12/08/2011   HTN (hypertension) 12/08/2011   Encephalopathy 12/08/2011   Past Medical History:  Past Medical History:  Diagnosis Date   CKD (chronic kidney disease) stage 3, GFR 30-59 ml/min (HCC)    Gait abnormality    Hypertension    Pancreatitis    Seizures ([place])    Venous insufficiency    Past Surgical History:  Past Surgical History:  Procedure Laterality Date   HERNIA REPAIR     HPI:  Mr. Carlos Shields is an 89 y.o. male who presented to [facility] ED with acute CVA. MRI brain showed multiple small acute infarcts involving the left basal ganglia, left frontal, parietal and occipital cortex and adjacent white matter. PMHx seizures, prior CVA, CKD, HTN.   Pt had an OP MBS on 1/11 - he c/o solid food dysphagia. Findings indicated occasional transient penetration of thin liquids (considered WFL) and delayed transition of 13 mm barium pill. Regular solids/thin liquids was recommended. Pt had f/u visit with OP SLP who reviewed findings and educated pt/family re: precautions. No SLP f/u was recommended.   Assessment / Plan / Recommendation Clinical Impression  [name] presented with mild dysarthria with some imprecision but intelligible speech. He was smiling, interactive and pleasant, oriented to person, place, and was able to state that he had had a "slight stroke."  There was no aphasia: expressive language was [facility]; he had occasional dysfluencies during speech; he followed one and two step commands without difficulty.  Deficits in short-term recall and verbal problem-solving were evident.  SLP will follow while admitted to acute care to address the aforementioned deficits.    SLP Assessment  SLP Recommendation/Assessment: Patient needs continued Speech [employer] [facility] SLP Visit Diagnosis: Cognitive communication deficit (R41.841)    Recommendations for follow up therapy are one component of a multi-disciplinary discharge planning process, led by the attending physician.  Recommendations may be updated based on patient status, additional functional criteria and insurance authorization.    Follow Up Recommendations  Acute inpatient rehab (3hours/day)    Assistance Recommended at Discharge  Frequent or constant Supervision/Assistance  Functional Status Assessment Patient has had a recent decline in their functional status and demonstrates the ability to make significant improvements in function in a reasonable and predictable amount of time.  Frequency and Duration min 1 x/week  2 weeks      SLP Evaluation Cognition  Overall Cognitive Status: No family/caregiver present to determine baseline cognitive functioning Arousal/Alertness: Awake/alert Orientation Level: Oriented to person;Oriented to place;Disoriented to time;Oriented to situation Attention: Sustained Sustained Attention: Appears intact Memory: Impaired Memory Impairment: Retrieval deficit Awareness: Impaired Awareness Impairment: Emergent impairment Problem Solving: Impaired Problem Solving Impairment: Verbal basic       Comprehension  Auditory Comprehension Overall Auditory Comprehension: Appears within functional limits for tasks assessed Commands: Impaired One Step Basic Commands: 75-100% accurate Two Step Basic Commands: 75-100% accurate Multistep Basic Commands:  25-49% accurate Visual Recognition/Discrimination Discrimination:  Within Function Limits    Expression Expression Primary Mode of Expression: Verbal Verbal Expression Overall Verbal Expression: Appears within functional limits for tasks assessed Automatic Speech: Social Response;Counting Level of Generative/Spontaneous Verbalization: Conversation Repetition: No impairment Naming: No impairment Pragmatics: No impairment Written Expression Dominant Hand: Right   Oral / Motor  Oral Motor/Sensory Function Overall Oral Motor/Sensory Function: Within functional limits Motor Speech Overall Motor Speech: Appears within functional limits for tasks assessed            Carlos Shields 08/06/2022, 2:42 PM  Cinthia Rodden L. Tivis Ringer, MA CCC/SLP Clinical Specialist - [place] Office number [phone]

## 2022-08-06 NOTE — Progress Notes (Signed)
Echocardiogram 2D Echocardiogram has been performed.  Ronny Flurry 08/06/2022, 11:28 AM

## 2022-08-06 NOTE — Evaluation (Signed)
Physical Therapy Evaluation Patient Details Name: Carlos Shields MRN: [id number] DOB: 1930-12-19 Today's Date: 08/06/2022  History of Present Illness  Pt is 89 yo male admitted 08/05/22 with R sided weakness found to have small acute infarcts  involving the left basal ganglia, left frontal, parietal and occipital cortex and adjacent white matter.  Pt with hx of seizure on antiepileptics, CVA, hypertension, CKD stage IIIa, dysphagia  Clinical Impression  Pt admitted with above diagnosis. At baseline, pt resides with daughter and reports he is independent in mobility with RW.  Today, pt requiring min-mod A for transfers and balance with tendency for posterior lean.  He as some decreased strength on R side and decreased coordination that limited his mobility and safety.  Pt with good rehab potential and expected to progress well. Pt currently with functional limitations due to the deficits listed below (see PT Problem List). Pt will benefit from skilled PT to increase their independence and safety with mobility to allow discharge to the venue listed below.          Recommendations for follow up therapy are one component of a multi-disciplinary discharge planning process, led by the attending physician.  Recommendations may be updated based on patient status, additional functional criteria and insurance authorization.  Follow Up Recommendations Acute inpatient rehab (3hours/day)      Assistance Recommended at Discharge Frequent or constant Supervision/Assistance  Patient can return home with the following  A lot of help with walking and/or transfers;A lot of help with bathing/dressing/bathroom    Equipment Recommendations None recommended by PT  Recommendations for Other Services  Rehab consult    Functional Status Assessment Patient has had a recent decline in their functional status and demonstrates the ability to make significant improvements in function in a reasonable and predictable amount of  time.     Precautions / Restrictions Precautions Precautions: Fall      Mobility  Bed Mobility Overal bed mobility: Needs Assistance Bed Mobility: Supine to Sit     Supine to sit: Mod assist     General bed mobility comments: increased time with assist to lift trunk and scoot forward    Transfers Overall transfer level: Needs assistance Equipment used: Rolling walker (2 wheels) Transfers: Sit to/from Stand, Bed to chair/wheelchair/BSC Sit to Stand: Mod assist Stand pivot transfers: Mod assist         General transfer comment: Sit to stand x 3 with cues for feet placement (feet blocked) and mod A to rise with posterior tendency    Ambulation/Gait Ambulation/Gait assistance: Mod assist, Min assist Gait Distance (Feet): 80 Feet Assistive device: Rolling walker (2 wheels) Gait Pattern/deviations: Step-to pattern, Decreased stride length, Narrow base of support, Trunk flexed, Leaning posteriorly Gait velocity: decreased     General Gait Details: Initial posterior lean requiring mod A but with cues able to progress to no posterior lean but still needing min A for balance and RW.  Pt with very narrow BOS and difficulty correcting  Stairs            Wheelchair Mobility    Modified Rankin (Stroke Patients Only) Modified Rankin (Stroke Patients Only) Pre-Morbid Rankin Score: Slight disability Modified Rankin: Moderately severe disability     Balance Overall balance assessment: Needs assistance Sitting-balance support: No upper extremity supported Sitting balance-Leahy Scale: Fair     Standing balance support: Bilateral upper extremity supported Standing balance-Leahy Scale: Poor Standing balance comment: Requiring RW; initial tendency to posterior lean requiring feet blocked to stand, cues to  get feeet under once standing, and cues to tuck buttock                             Pertinent Vitals/Pain Pain Assessment Pain Assessment: No/denies pain     Home Living Family/patient expects to be discharged to:: Private residence Living Arrangements: Children Available Help at Discharge: Family;Available 24 hours/day Type of Home: House Home Access: Level entry       Home Layout: One level Home Equipment: Tub bench;Rolling Walker (2 wheels);Cane - single point;Cane - quad;BSC/3in1 Additional Comments: Pt somewhat questionable historian but information mostly agrees with prior admissions    Prior Function Prior Level of Function : Independent/Modified Independent             Mobility Comments: Ambulatory with RW; reports could walk in community ADLs Comments: Reports independent with ADLs     Hand Dominance   Dominant Hand: Right    Extremity/Trunk Assessment   Upper Extremity Assessment Upper Extremity Assessment: Defer to OT evaluation    Lower Extremity Assessment Lower Extremity Assessment: RLE deficits/detail;LLE deficits/detail RLE Deficits / Details: ROM WFL; MMT 4+/5 throughout RLE Sensation: WNL RLE Coordination: decreased gross motor;decreased fine motor (decreased coordination with heel shin and foot taps) LLE Deficits / Details: ROM WFL; MMT 5/5 throughout LLE Sensation: WNL LLE Coordination: WNL    Cervical / Trunk Assessment Cervical / Trunk Assessment: Kyphotic  Communication   Communication: No difficulties  Cognition Arousal/Alertness: Awake/alert Behavior During Therapy: WFL for tasks assessed/performed Overall Cognitive Status: No family/caregiver present to determine baseline cognitive functioning Area of Impairment: Awareness, Safety/judgement, Orientation, Problem solving                 Orientation Level: Disoriented to, Time       Safety/Judgement: Decreased awareness of safety, Decreased awareness of deficits Awareness: Emergent Problem Solving: Slow processing, Requires verbal cues, Requires tactile cues          General Comments General comments (skin integrity,  edema, etc.): VSS    Exercises     Assessment/Plan    PT Assessment Patient needs continued PT services  PT Problem List Decreased strength;Decreased cognition;Decreased range of motion;Decreased knowledge of use of DME;Decreased activity tolerance;Decreased safety awareness;Decreased balance;Decreased knowledge of precautions;Decreased mobility;Decreased coordination       PT Treatment Interventions DME instruction;Balance training;Gait training;Stair training;Functional mobility training;Cognitive remediation;Patient/family education;Therapeutic activities;Therapeutic exercise;Neuromuscular re-education    PT Goals (Current goals can be found in the Care Plan section)  Acute Rehab PT Goals Patient Stated Goal: not stated PT Goal Formulation: With patient Time For Goal Achievement: 08/19/22 Potential to Achieve Goals: Good    Frequency Min 4X/week     Co-evaluation PT/OT/SLP Co-Evaluation/Treatment: Yes Reason for Co-Treatment: Complexity of the patient's impairments (multi-system involvement);For patient/therapist safety (ED on stretcher) PT goals addressed during session: Mobility/safety with mobility OT goals addressed during session: ADL's and self-care       AM-PAC PT "6 Clicks" Mobility  Outcome Measure Help needed turning from your back to your side while in a flat bed without using bedrails?: A Little Help needed moving from lying on your back to sitting on the side of a flat bed without using bedrails?: A Lot Help needed moving to and from a bed to a chair (including a wheelchair)?: A Lot Help needed standing up from a chair using your arms (e.g., wheelchair or bedside chair)?: A Lot Help needed to walk in hospital room?: A Lot Help needed  climbing 3-5 steps with a railing? : A Lot 6 Click Score: 13    End of Session Equipment Utilized During Treatment: Gait belt Activity Tolerance: Patient tolerated treatment well Patient left: with call bell/phone within  reach;in bed;with bed alarm set Nurse Communication: Mobility status PT Visit Diagnosis: Other abnormalities of gait and mobility (R26.89);Hemiplegia and hemiparesis Hemiplegia - Right/Left: Right Hemiplegia - dominant/non-dominant: Dominant Hemiplegia - caused by: Cerebral infarction    Time: 1950-9326 PT Time Calculation (min) (ACUTE ONLY): 29 min   Charges:   PT Evaluation $PT Eval Low Complexity: 1 Low          Ellsworth Waldschmidt, PT Acute Rehab [facility] [phone]   Karlton Lemon 08/06/2022, 1:28 PM

## 2022-08-06 NOTE — ED Notes (Signed)
ED TO INPATIENT HANDOFF REPORT  Name/Age/Gender Carlos Shields 89 y.o. male  Code Status    Code Status Orders  (From admission, onward)           Start     Ordered   08/05/22 2109  Full code  Continuous       Question:  By:  Answer:  Consent: discussion documented in EHR   08/05/22 2109           Code Status History     Date Active Date Inactive Code Status Order ID Comments User Context   04/26/2021 0109 04/28/2021 2224 Full Code [id number]  Eduard Clos, MD Inpatient   05/04/2013 0132 05/04/2013 1859 Full Code [id number]  Dorothea Ogle, MD Inpatient   01/13/2012 1640 01/15/2012 1631 Full Code [id number]  Rodolph Bong, MD Inpatient   12/08/2011 0940 12/09/2011 1544 Full Code [id number]  Haywood Lasso, RN Inpatient       Home/SNF/Other Home  Chief Complaint CVA (cerebral vascular accident) [facility]) [I63.9]  Level of Care/Admitting Diagnosis ED Disposition     ED Disposition  Admit   Condition  --   Comment  Hospital Area: [facility] [[id number]]  Level of Care: Telemetry Medical [[id number]]  May admit patient to [name] or [facility] if equivalent level of care is available:: No  Covid Evaluation: Asymptomatic - no recent exposure (last 10 days) testing not required  Diagnosis: CVA (cerebral vascular accident) [facility]) [[id number]]  Admitting Physician: Anselm Jungling [[id number]]  Attending Physician: RAI, RIPUDEEP K [[id number]]  Certification:: I certify this patient will need inpatient services for at least 2 midnights  Estimated Length of Stay: 3          Medical History Past Medical History:  Diagnosis Date   CKD (chronic kidney disease) stage 3, GFR 30-59 ml/min (HCC)    Gait abnormality    Hypertension    Pancreatitis    Seizures (HCC)    Venous insufficiency     Allergies No Known Allergies  IV Location/Drains/Wounds Patient Lines/Drains/Airways Status     Active Line/Drains/Airways     Name Placement date Placement time Site  Days   Peripheral IV 08/05/22 20 G Right Antecubital 08/05/22  1432  Antecubital  1            Labs/Imaging Results for orders placed or performed during the hospital encounter of 08/05/22 (from the past 48 hour(s))  CBG monitoring, ED     Status: None   Collection Time: 08/05/22  2:22 PM  Result Value Ref Range   Glucose-Capillary 92 70 - 99 mg/dL    Comment: Glucose reference range applies only to samples taken after fasting for at least 8 hours.  Urine rapid drug screen (hosp performed)     Status: None   Collection Time: 08/05/22  2:26 PM  Result Value Ref Range   Opiates NONE DETECTED NONE DETECTED   Cocaine NONE DETECTED NONE DETECTED   Benzodiazepines NONE DETECTED NONE DETECTED   Amphetamines NONE DETECTED NONE DETECTED   Tetrahydrocannabinol NONE DETECTED NONE DETECTED   Barbiturates NONE DETECTED NONE DETECTED    Comment: (NOTE) DRUG SCREEN FOR MEDICAL PURPOSES ONLY.  IF CONFIRMATION IS NEEDED FOR ANY PURPOSE, NOTIFY LAB WITHIN 5 DAYS.  LOWEST DETECTABLE LIMITS FOR URINE DRUG SCREEN Drug Class                     Cutoff (ng/mL) Amphetamine and metabolites    1000 Barbiturate  and metabolites    200 Benzodiazepine                 200 Opiates and metabolites        300 Cocaine and metabolites        300 THC                            50 Performed at [facility], [address] [place], [place] [address]   Urinalysis, Routine w reflex microscopic -Urine, Clean Catch     Status: None   Collection Time: 08/05/22  2:26 PM  Result Value Ref Range   Color, Urine YELLOW YELLOW   APPearance CLEAR CLEAR   Specific Gravity, Urine 1.012 1.005 - 1.030   pH 6.0 5.0 - 8.0   Glucose, UA NEGATIVE NEGATIVE mg/dL   Hgb urine dipstick NEGATIVE NEGATIVE   Bilirubin Urine NEGATIVE NEGATIVE   Ketones, ur NEGATIVE NEGATIVE mg/dL   Protein, ur NEGATIVE NEGATIVE mg/dL   Nitrite NEGATIVE NEGATIVE   Leukocytes,Ua NEGATIVE NEGATIVE    Comment: Performed at  [facility], [address] [place], [place] [address]  Protime-INR     Status: Abnormal   Collection Time: 08/05/22  2:28 PM  Result Value Ref Range   Prothrombin Time 16.0 (H) 11.4 - 15.2 seconds   INR 1.3 (H) 0.8 - 1.2    Comment: (NOTE) INR goal varies based on device and disease states. Performed at [facility], [address] [place], [place] [address]   APTT     Status: None   Collection Time: 08/05/22  2:28 PM  Result Value Ref Range   aPTT 29 24 - 36 seconds    Comment: Performed at [facility], [address] [place], [place] [address]  CBC     Status: Abnormal   Collection Time: 08/05/22  2:28 PM  Result Value Ref Range   WBC 7.1 4.0 - 10.5 K/uL   RBC 3.36 (L) 4.22 - 5.81 MIL/uL   Hemoglobin 9.5 (L) 13.0 - 17.0 g/dL   HCT 30.830.0 (L) 65.739.0 - 84.652.0 %   MCV 89.3 80.0 - 100.0 fL   MCH 28.3 26.0 - 34.0 pg   MCHC 31.7 30.0 - 36.0 g/dL   RDW 96.212.4 95.211.5 - 84.115.5 %   Platelets 187 150 - 400 K/uL   nRBC 0.0 0.0 - 0.2 %    Comment: Performed at [facility], [address] [place], [place] [address]  Differential     Status: None   Collection Time: 08/05/22  2:28 PM  Result Value Ref Range   Neutrophils Relative % 56 %   Neutro Abs 4.0 1.7 - 7.7 K/uL   Lymphocytes Relative 32 %   Lymphs Abs 2.3 0.7 - 4.0 K/uL   Monocytes Relative 11 %   Monocytes Absolute 0.8 0.1 - 1.0 K/uL   Eosinophils Relative 0 %   Eosinophils Absolute 0.0 0.0 - 0.5 K/uL   Basophils Relative 1 %   Basophils Absolute 0.1 0.0 - 0.1 K/uL   Immature Granulocytes 0 %   Abs Immature Granulocytes 0.01 0.00 - 0.07 K/uL    Comment: Performed at [facility], [address] [place], [place] [address]  Comprehensive metabolic panel     Status: Abnormal   Collection Time: 08/05/22  2:28 PM  Result Value Ref Range   Sodium 136 135 - 145  mmol/L   Potassium 4.5 3.5 - 5.1 mmol/L   Chloride 104 98 - 111 mmol/L   CO2  23 22 - 32 mmol/L   Glucose, Bld 120 (H) 70 - 99 mg/dL    Comment: Glucose reference range applies only to samples taken after fasting for at least 8 hours.   BUN 36 (H) 8 - 23 mg/dL   Creatinine, Ser 6.94 (H) 0.61 - 1.24 mg/dL   Calcium 9.1 8.9 - 85.4 mg/dL   Total Protein 7.8 6.5 - 8.1 g/dL   Albumin 4.3 3.5 - 5.0 g/dL   AST 29 15 - 41 U/L   ALT 13 0 - 44 U/L   Alkaline Phosphatase 66 38 - 126 U/L   Total Bilirubin 1.0 0.3 - 1.2 mg/dL   GFR, Estimated 31 (L) >60 mL/min    Comment: (NOTE) Calculated using the CKD-EPI Creatinine Equation (2021)    Anion gap 9 5 - 15    Comment: Performed at [facility], [address] [place], [place] [address]  Ethanol     Status: None   Collection Time: 08/05/22  2:28 PM  Result Value Ref Range   Alcohol, Ethyl (B) <10 <10 mg/dL    Comment: (NOTE) Lowest detectable limit for serum alcohol is 10 mg/dL.  For medical purposes only. Performed at [facility], [address] [place], [place] [address]   Troponin I (High Sensitivity)     Status: None   Collection Time: 08/05/22  2:28 PM  Result Value Ref Range   Troponin I (High Sensitivity) 4 <18 ng/L    Comment: (NOTE) Elevated high sensitivity troponin I (hsTnI) values and significant  changes across serial measurements may suggest ACS but many other  chronic and acute conditions are known to elevate hsTnI results.  Refer to the "Links" section for chest pain algorithms and additional  guidance. Performed at [facility], [address], [place], [place] [address]   TSH     Status: Abnormal   Collection Time: 08/05/22  2:28 PM  Result Value Ref Range   TSH 4.922 (H) 0.350 - 4.500 uIU/mL    Comment: Performed by a 3rd Generation assay with a functional sensitivity of <=0.01 uIU/mL. Performed at [facility], [address], [place], [place] [address]   Hemoglobin A1c     Status: None   Collection Time:  08/05/22  2:28 PM  Result Value Ref Range   Hgb A1c MFr Bld 5.3 4.8 - 5.6 %    Comment: (NOTE) Pre diabetes:          5.7%-6.4%  Diabetes:              >6.4%  Glycemic control for   <7.0% adults with diabetes    Mean Plasma Glucose 105.41 mg/dL    Comment: Performed at [facility], [address] [place], [place] [address]  I-stat chem 8, ED     Status: Abnormal   Collection Time: 08/05/22  2:34 PM  Result Value Ref Range   Sodium 138 135 - 145 mmol/L   Potassium 4.7 3.5 - 5.1 mmol/L   Chloride 105 98 - 111 mmol/L   BUN 37 (H) 8 - 23 mg/dL   Creatinine, Ser 3.81 (H) 0.61 - 1.24 mg/dL   Glucose, Bld 017 (H) 70 - 99 mg/dL    Comment: Glucose reference range applies only to samples taken after fasting for at least 8 hours.   Calcium,  Ion 1.16 1.15 - 1.40 mmol/L   TCO2 24 22 - 32 mmol/L   Hemoglobin 10.9 (L) 13.0 - 17.0 g/dL   HCT 32.0 (L) 39.0 - 52.0 %  Lipid panel     Status: None   Collection Time: 08/06/22  5:23 AM  Result Value Ref Range   Cholesterol 143 0 - 200 mg/dL   Triglycerides 27 <150 mg/dL   HDL 69 >40 mg/dL   Total CHOL/HDL Ratio 2.1 RATIO   VLDL 5 0 - 40 mg/dL   LDL Cholesterol 69 0 - 99 mg/dL    Comment:        Total Cholesterol/HDL:CHD Risk Coronary Heart Disease Risk Table                     Men   Women  1/2 Average Risk   3.4   3.3  Average Risk       5.0   4.4  2 X Average Risk   9.6   7.1  3 X Average Risk  23.4   11.0        Use the calculated Patient Ratio above and the CHD Risk Table to determine the patient's CHD Risk.        ATP III CLASSIFICATION (LDL):  <100     mg/dL   Optimal  100-129  mg/dL   Near or Above                    Optimal  130-159  mg/dL   Borderline  160-189  mg/dL   High  >190     mg/dL   Very High Performed at [facility] [address] [place], [place] [address]    VAS US CAROTID (at [facility] and [facility] only)  Result Date: 08/06/2022 Carotid Arterial Duplex Study Patient Name:  Carlos Shields  Date  of Exam:   08/06/2022 Medical Rec #: [id number]  Accession #:    [id number] Date of Birth: 09-Jun-1931  Patient Gender: M Patient Age:   76 years Exam Location:  [facility] Procedure:      VAS US CAROTID Referring Phys: CHING TU --------------------------------------------------------------------------------  Indications:   CVA. Risk Factors:  Hypertension. Other Factors: Seizures. Performing Technologist: Darlin Coco RDMS, RVT  Examination Guidelines: A complete evaluation includes B-mode imaging, spectral Doppler, color Doppler, and power Doppler as needed of all accessible portions of each vessel. Bilateral testing is considered an integral part of a complete examination. Limited examinations for reoccurring indications may be performed as noted.  Right Carotid Findings: +----------+--------+--------+--------+---------------------------+--------+           PSV cm/sEDV cm/sStenosisPlaque Description         Comments +----------+--------+--------+--------+---------------------------+--------+ CCA Prox  57      15                                                  +----------+--------+--------+--------+---------------------------+--------+ CCA Distal49      13                                                  +----------+--------+--------+--------+---------------------------+--------+ ICA Prox  226     74      60-79%  hypoechoic and heterogenous         +----------+--------+--------+--------+---------------------------+--------+ ICA Mid   166     49                                         tortuous +----------+--------+--------+--------+---------------------------+--------+ ICA Distal64      20                                                  +----------+--------+--------+--------+---------------------------+--------+ ECA       37                                                          +----------+--------+--------+--------+---------------------------+--------+  +----------+--------+-------+---------+-------------------+           PSV cm/sEDV cmsDescribe Arm Pressure (mmHG) +----------+--------+-------+---------+-------------------+ [id number]             Turbulent                    +----------+--------+-------+---------+-------------------+ +---------+--------+--+--------+--+---------+ VertebralPSV cm/s48EDV cm/s15Antegrade +---------+--------+--+--------+--+---------+  Left Carotid Findings: +---------+--------+-------+--------+------------------------+-----------------+          PSV cm/sEDV    StenosisPlaque Description      Comments                           cm/s                                                     +---------+--------+-------+--------+------------------------+-----------------+ CCA Prox 71      9                                      intimal                                                                   thickening        +---------+--------+-------+--------+------------------------+-----------------+ CCA      41      7                                                        Distal                                                                    +---------+--------+-------+--------+------------------------+-----------------+  ICA Prox 255     75     60-79%  heterogenous and                                                          irregular                                 +---------+--------+-------+--------+------------------------+-----------------+ ICA Mid  107     22                                     tortuous          +---------+--------+-------+--------+------------------------+-----------------+ ICA      80      20                                                       Distal                                                                    +---------+--------+-------+--------+------------------------+-----------------+ ECA      37                                                                +---------+--------+-------+--------+------------------------+-----------------+ +----------+--------+--------+---------+-------------------+           PSV cm/sEDV cm/sDescribe Arm Pressure (mmHG) +----------+--------+--------+---------+-------------------+ Subclavian221             Turbulent                    +----------+--------+--------+---------+-------------------+ +---------+--------+--+--------+--+---------+ VertebralPSV cm/s44EDV cm/s12Antegrade +---------+--------+--+--------+--+---------+   Summary: Right Carotid: Velocities in the right ICA are consistent with a 60-79%                stenosis. Left Carotid: Velocities in the left ICA are consistent with a 60-79% stenosis. Vertebrals:  Bilateral vertebral arteries demonstrate antegrade flow. Subclavians: Bilateral subclavian artery flow was disturbed. *See table(s) above for measurements and observations.     Preliminary    MR ANGIO HEAD WO CONTRAST  Result Date: 08/06/2022 CLINICAL DATA:  Follow-up examination for stroke. EXAM: MRA HEAD WITHOUT CONTRAST TECHNIQUE: Angiographic images of the Circle of Willis were acquired using MRA technique without intravenous contrast. COMPARISON:  Prior brain MRI from earlier the same day. FINDINGS: Anterior circulation: Visualized distal cervical segments of the internal carotid arteries are patent with antegrade flow. Atheromatous irregularity within the carotid siphons with associated moderate stenosis at the anterior genu of the left carotid siphon (series 10003, image 7). A1 segments, anterior communicating complex common anterior cerebral arteries patent bilaterally. Both  M1 segments patent. Left M1 bifurcates early. No proximal MCA branch occlusion or high-grade stenosis. Distal MCA branches perfused and symmetric. Posterior circulation: Atheromatous irregularity within the left V4 segment without high-grade stenosis. Right vertebral  artery dominant and widely patent. Right vertebral artery dominant. Both PICA patent. Basilar widely patent without stenosis. Superior cerebellar arteries patent bilaterally. Both PCAs primarily supplied via the basilar, although a prominent left posterior is noted. Right PCA patent to its distal aspect without stenosis. Probable focal severe distal left P3 stenosis noted (series 1028, image 27). Atheromatous irregularity within the left P2 segment without high-grade stenosis. Anatomic variants: None significant. Other: No intracranial aneurysm. IMPRESSION: 1. Negative intracranial MRA for large vessel occlusion. 2. Intracranial atherosclerotic disease with associated moderate stenosis at the left carotid siphon, with additional severe distal left P3 stenosis. No other proximal high-grade or correctable stenosis Electronically Signed   By: Rise Mu M.D.   On: 08/06/2022 00:02   MR BRAIN WO CONTRAST  Result Date: 08/05/2022 CLINICAL DATA:  Neuro deficit, acute, stroke suspected EXAM: MRI HEAD WITHOUT CONTRAST TECHNIQUE: Multiplanar, multiecho pulse sequences of the brain and surrounding structures were obtained without intravenous contrast. COMPARISON:  Same day CT head. FINDINGS: Brain: Multiple small acute infarcts involving the left basal ganglia, left frontal, parietal and occipital cortex and adjacent white matter. Slight edema without mass effect. Additional scattered T2/FLAIR hyperintensities within the white matter, compatible with chronic microvascular ischemic disease. No evidence of acute hemorrhage, mass lesion, midline shift or fall hydrocephalus. Cerebral atrophy. Vascular: Major arterial flow voids are maintained. Skull and upper cervical spine: Normal marrow signal. Sinuses/Orbits: Clear sinuses.  No acute orbital findings. Other: No mastoid effusions. IMPRESSION: 1. Multiple small acute infarcts involving the left basal ganglia, left frontal, parietal and occipital cortex and adjacent  white matter. Slight edema without mass effect. 2. Advanced brain atrophy (ICD10-G31.9). and chronic microvascular ischemic disease. Electronically Signed   By: Feliberto Harts M.D.   On: 08/05/2022 18:42   CT HEAD CODE STROKE WO CONTRAST  Result Date: 08/05/2022 CLINICAL DATA:  Code stroke.  Neuro deficit, acute, stroke suspected EXAM: CT HEAD WITHOUT CONTRAST TECHNIQUE: Contiguous axial images were obtained from the base of the skull through the vertex without intravenous contrast. RADIATION DOSE REDUCTION: This exam was performed according to the departmental dose-optimization program which includes automated exposure control, adjustment of the mA and/or kV according to patient size and/or use of iterative reconstruction technique. COMPARISON:  Apr 25, 2021 FINDINGS: Brain: Patchy white matter hypodensities, nonspecific but compatible with chronic microvascular ischemic disease. Similar chronic lacunar infarcts involving the basal ganglia and thalami. No evidence of acute large vascular territory infarct, acute hemorrhage, mass lesion or midline shift. Vascular: Calcific atherosclerosis. No hyperdense vessel identified. Skull: No acute fracture. Sinuses/Orbits: Clear sinuses.  No acute orbital findings. Other: No mastoid effusions. ASPECTS [facility] Stroke Program Early CT Score) total score (0-10 with 10 being normal): 10. IMPRESSION: 1. No evidence of acute abnormality. ASPECTS is 10. 2. Similar chronic microvascular ischemic disease. Findings discussed with Dr. Wallace Cullens via telephone at 2:44 PM. Electronically Signed   By: Feliberto Harts M.D.   On: 08/05/2022 14:45    Pending Labs Unresulted Labs (From admission, onward)     Start     Ordered   08/07/22 0500  CBC  Tomorrow morning,   R        08/06/22 1031   08/07/22 0500  Basic metabolic panel  Tomorrow morning,   R  08/06/22 1031   08/06/22 0500  T4, free  Tomorrow morning,   R        08/05/22 2141            Vitals/Pain Today's  Vitals   08/06/22 0657 08/06/22 0730 08/06/22 0745 08/06/22 1015  BP:  129/72 132/61 138/86  Pulse:  65 60 76  Resp:  18  18  Temp: 97.9 F (36.6 C)     TempSrc: Oral     SpO2:  100% 100% 100%  Weight:        Isolation Precautions No active isolations  Medications Medications   stroke: early stages of recovery book (has no administration in time range)  acetaminophen (TYLENOL) tablet 650 mg (has no administration in time range)    Or  acetaminophen (TYLENOL) 160 MG/5ML solution 650 mg (has no administration in time range)    Or  acetaminophen (TYLENOL) suppository 650 mg (has no administration in time range)  enoxaparin (LOVENOX) injection 30 mg (30 mg Subcutaneous Given 08/05/22 2321)  levETIRAcetam (KEPPRA) tablet 500 mg (500 mg Oral Given 08/05/22 2321)  pantoprazole (PROTONIX) EC tablet 40 mg (has no administration in time range)  aspirin EC tablet 81 mg (81 mg Oral Given 08/05/22 2321)  0.9 %  sodium chloride infusion (has no administration in time range)  levETIRAcetam (KEPPRA) IVPB 1000 mg/100 mL premix (0 mg Intravenous Stopped 08/05/22 1617)  sodium chloride 0.9 % bolus 500 mL (0 mLs Intravenous Stopped 08/05/22 1700)    Mobility non-ambulatory

## 2022-08-06 NOTE — Progress Notes (Signed)
[facility]                                                                              Carlos Shields, is a 89 y.o. male, DOB - 05/27/1931, ZCH:[id number] Admit date - 08/05/2022    Outpatient Primary MD for the patient is Carlos Sailors, PA  LOS - 0  days  Chief Complaint  Patient presents with   Code Stroke   Seizures       Brief summary   Patient is a 89 year old male with history of seizure on antiepileptics, CVA, hypertension, CKD stage IIIa, dysphagia who presented with right-sided weakness.  Patient's daughter reported that normally ambulates with a walker.  On the day of admission, around 1 to 2 PM, he wet his bed which was unusual for him, she saw him falling out of the bed and seemed to be dragging his leg.  Right hand appeared twisted so the daughter thought that he was having a seizure and called EMS.  He had missed few doses of his Keppra due to issues with pharmacy and ran out of aspirin for a week.  Patient's son also had died the previous week.  CT head was negative.  No tPA or thrombectomy as he was outside window.  Patient was evaluated over telemetry by Dr. Leonel Ramsay, recommended MRI brain.  Patient received additional Keppra MRI brain showed multiple small acute infarcts involving the left basal ganglia, left frontal, parietal and occipital cortex and adjacent white matter.  Slight edema without mass effect. Admitted for further workup.  Assessment & Plan    Principal Problem: Acute CVA (cerebral vascular accident) ([place]) with prior history of CVA -MRI brain showed multiple small acute infarct involving the left basal ganglia, left frontal, right occipital cortex and adjacent white matter.  Slight edema without mass effect.  Distribution of stroke appears to be embolic but no prior history of A-fib. -Continue telemetry, had missed aspirin for past week, was resumed -MRA showed negative intracranial MRA for large vessel occlusion.  Intracranial  atherosclerotic disease with associated moderate stenosis at left carotid siphon with additional severe distal left P3 stenosis. -Pending stroke workup, 2D echo, PT OT evaluation -Permissive hypertension, holding amlodipine, metoprolol -Continue aspirin 81 mg daily -Passed SLP evaluation, started on heart healthy diet -Lipid panel showed cholesterol 143, LDL 69, neurology consulted  Active Problems:   Seizures ([place]) -Due to concern for possible breakthrough seizure, patient received additional 1 g IV Keppra in ED -Continue Keppra 500 mg twice daily -Seizure precautions  Acute on chronic CKD, stage IIIa -Baseline creatinine 1.2-1.4, was 1.9 on 07/02/2022 -Creatinine 2.0 on admission, trended up to 2.1 today, placed on gentle IV fluid hydration -Obtain renal ultrasound    HTN (hypertension) -Continue permissive hypertension in the setting of acute CVA  Chronic normocytic anemia -Hemoglobin baseline 9-10 -Currently at baseline   Abnormal TSH -TSH 4.9, obtain free T4     Acute metabolic encephalopathy -Likely due to #1  Estimated body mass index is 19.56 kg/m as calculated from the following:   Height as of 06/16/22: 5\' 4"  (1.626 m).   Weight as of this encounter: 51.7  kg.  Code Status: Full CODE STATUS DVT Prophylaxis:  enoxaparin (LOVENOX) injection 30 mg Start: 08/05/22 2200   Level of Care: Level of care: Telemetry Medical Family Communication:  Disposition Plan:      Remains inpatient appropriate: Stroke workup in progress, awaiting transfer to [facility]   Procedures:  MRI brain, MRA  Consultants:   Neurology  Antimicrobials: None   Medications   stroke: early stages of recovery book   Does not apply Once   aspirin EC  81 mg Oral Daily   enoxaparin (LOVENOX) injection  30 mg Subcutaneous Q24H   levETIRAcetam  500 mg Oral BID   pantoprazole  40 mg Oral Daily      Subjective:   Carlos Shields was seen and examined today.  Alert awake and oriented to self, no acute  complaints.  No acute nausea vomiting, chest pain or shortness of breath.  Objective:   Vitals:   08/06/22 0445 08/06/22 0657 08/06/22 0730 08/06/22 0745  BP: 127/72  129/72 132/61  Pulse: 61  65 60  Resp: 18  18   Temp:  97.9 F (36.6 C)    TempSrc:  Oral    SpO2: 100%  100% 100%  Weight:       No intake or output data in the 24 hours ending 08/06/22 1021   Wt Readings from Last 3 Encounters:  08/05/22 51.7 kg  06/16/22 51.7 kg  04/25/21 54.4 kg     Exam General: Alert and oriented x self, NAD Cardiovascular: S1 S2 auscultated,  RRR Respiratory: Clear to auscultation bilaterally, no wheezing Gastrointestinal: Soft, nontender, nondistended, + bowel sounds Ext: no pedal edema bilaterally Neuro: 5/5 strength of bilateral lower extremity, mild decrease in RUE 4/5 Skin: No rashes Psych: flat affect    Data Reviewed:  I have personally reviewed following labs    CBC Lab Results  Component Value Date   WBC 7.1 08/05/2022   RBC 3.36 (L) 08/05/2022   HGB 10.9 (L) 08/05/2022   HCT 32.0 (L) 08/05/2022   MCV 89.3 08/05/2022   MCH 28.3 08/05/2022   PLT 187 08/05/2022   MCHC 31.7 08/05/2022   RDW 12.4 08/05/2022   LYMPHSABS 2.3 08/05/2022   MONOABS 0.8 08/05/2022   EOSABS 0.0 08/05/2022   BASOSABS 0.1 123456     Last metabolic panel Lab Results  Component Value Date   NA 138 08/05/2022   K 4.7 08/05/2022   CL 105 08/05/2022   CO2 23 08/05/2022   BUN 37 (H) 08/05/2022   CREATININE 2.10 (H) 08/05/2022   GLUCOSE 116 (H) 08/05/2022   GFRNONAA 31 (L) 08/05/2022   GFRAA 51 (L) 11/19/2019   CALCIUM 9.1 08/05/2022   PHOS 2.9 05/04/2013   PROT 7.8 08/05/2022   ALBUMIN 4.3 08/05/2022   BILITOT 1.0 08/05/2022   ALKPHOS 66 08/05/2022   AST 29 08/05/2022   ALT 13 08/05/2022   ANIONGAP 9 08/05/2022    CBG (last 3)  Recent Labs    08/05/22 1422  GLUCAP 92      Coagulation Profile: Recent Labs  Lab 08/05/22 1428  INR 1.3*     Radiology Studies:  I have personally reviewed the imaging studies  VAS US CAROTID (at [facility] and [facility] only)  Result Date: 08/06/2022 Carotid Arterial Duplex Study Patient Name:  Carlos Shields  Date of Exam:   08/06/2022 Medical Rec #: [id number]  Accession #:    [id number] Date of Birth: 1931/02/12  Patient Gender: M Patient Age:   45  years Exam Location:  [facility] Procedure:      VAS US CAROTID Referring Phys: CHING TU --------------------------------------------------------------------------------  Indications:   CVA. Risk Factors:  Hypertension. Other Factors: Seizures. Performing Technologist: Darlin Coco RDMS, RVT  Examination Guidelines: A complete evaluation includes B-mode imaging, spectral Doppler, color Doppler, and power Doppler as needed of all accessible portions of each vessel. Bilateral testing is considered an integral part of a complete examination. Limited examinations for reoccurring indications may be performed as noted.  Right Carotid Findings: +----------+--------+--------+--------+---------------------------+--------+           PSV cm/sEDV cm/sStenosisPlaque Description         Comments +----------+--------+--------+--------+---------------------------+--------+ CCA Prox  57      15                                                  +----------+--------+--------+--------+---------------------------+--------+ CCA Distal49      13                                                  +----------+--------+--------+--------+---------------------------+--------+ ICA Prox  226     74      60-79%  hypoechoic and heterogenous         +----------+--------+--------+--------+---------------------------+--------+ ICA Mid   166     49                                         tortuous +----------+--------+--------+--------+---------------------------+--------+ ICA Distal64      20                                                   +----------+--------+--------+--------+---------------------------+--------+ ECA       37                                                          +----------+--------+--------+--------+---------------------------+--------+ +----------+--------+-------+---------+-------------------+           PSV cm/sEDV cmsDescribe Arm Pressure (mmHG) +----------+--------+-------+---------+-------------------+ [id number]             Turbulent                    +----------+--------+-------+---------+-------------------+ +---------+--------+--+--------+--+---------+ VertebralPSV cm/s48EDV cm/s15Antegrade +---------+--------+--+--------+--+---------+  Left Carotid Findings: +---------+--------+-------+--------+------------------------+-----------------+          PSV cm/sEDV    StenosisPlaque Description      Comments                           cm/s                                                     +---------+--------+-------+--------+------------------------+-----------------+  CCA Prox 71      9                                      intimal                                                                   thickening        +---------+--------+-------+--------+------------------------+-----------------+ CCA      41      7                                                        Distal                                                                    +---------+--------+-------+--------+------------------------+-----------------+ ICA Prox 255     75     60-79%  heterogenous and                                                          irregular                                 +---------+--------+-------+--------+------------------------+-----------------+ ICA Mid  107     22                                     tortuous          +---------+--------+-------+--------+------------------------+-----------------+ ICA      80      20                                                        Distal                                                                    +---------+--------+-------+--------+------------------------+-----------------+ ECA      37                                                               +---------+--------+-------+--------+------------------------+-----------------+ +----------+--------+--------+---------+-------------------+  PSV cm/sEDV cm/sDescribe Arm Pressure (mmHG) +----------+--------+--------+---------+-------------------+ Subclavian221             Turbulent                    +----------+--------+--------+---------+-------------------+ +---------+--------+--+--------+--+---------+ VertebralPSV cm/s44EDV cm/s12Antegrade +---------+--------+--+--------+--+---------+   Summary: Right Carotid: Velocities in the right ICA are consistent with a 60-79%                stenosis. Left Carotid: Velocities in the left ICA are consistent with a 60-79% stenosis. Vertebrals:  Bilateral vertebral arteries demonstrate antegrade flow. Subclavians: Bilateral subclavian artery flow was disturbed. *See table(s) above for measurements and observations.     Preliminary    MR ANGIO HEAD WO CONTRAST  Result Date: 08/06/2022 CLINICAL DATA:  Follow-up examination for stroke. EXAM: MRA HEAD WITHOUT CONTRAST TECHNIQUE: Angiographic images of the Circle of Willis were acquired using MRA technique without intravenous contrast. COMPARISON:  Prior brain MRI from earlier the same day. FINDINGS: Anterior circulation: Visualized distal cervical segments of the internal carotid arteries are patent with antegrade flow. Atheromatous irregularity within the carotid siphons with associated moderate stenosis at the anterior genu of the left carotid siphon (series 10003, image 7). A1 segments, anterior communicating complex common anterior cerebral arteries patent bilaterally. Both M1 segments patent. Left M1 bifurcates  early. No proximal MCA branch occlusion or high-grade stenosis. Distal MCA branches perfused and symmetric. Posterior circulation: Atheromatous irregularity within the left V4 segment without high-grade stenosis. Right vertebral artery dominant and widely patent. Right vertebral artery dominant. Both PICA patent. Basilar widely patent without stenosis. Superior cerebellar arteries patent bilaterally. Both PCAs primarily supplied via the basilar, although a prominent left posterior is noted. Right PCA patent to its distal aspect without stenosis. Probable focal severe distal left P3 stenosis noted (series 1028, image 27). Atheromatous irregularity within the left P2 segment without high-grade stenosis. Anatomic variants: None significant. Other: No intracranial aneurysm. IMPRESSION: 1. Negative intracranial MRA for large vessel occlusion. 2. Intracranial atherosclerotic disease with associated moderate stenosis at the left carotid siphon, with additional severe distal left P3 stenosis. No other proximal high-grade or correctable stenosis Electronically Signed   By: Jeannine Boga M.D.   On: 08/06/2022 00:02   MR BRAIN WO CONTRAST  Result Date: 08/05/2022 CLINICAL DATA:  Neuro deficit, acute, stroke suspected EXAM: MRI HEAD WITHOUT CONTRAST TECHNIQUE: Multiplanar, multiecho pulse sequences of the brain and surrounding structures were obtained without intravenous contrast. COMPARISON:  Same day CT head. FINDINGS: Brain: Multiple small acute infarcts involving the left basal ganglia, left frontal, parietal and occipital cortex and adjacent white matter. Slight edema without mass effect. Additional scattered T2/FLAIR hyperintensities within the white matter, compatible with chronic microvascular ischemic disease. No evidence of acute hemorrhage, mass lesion, midline shift or fall hydrocephalus. Cerebral atrophy. Vascular: Major arterial flow voids are maintained. Skull and upper cervical spine: Normal marrow  signal. Sinuses/Orbits: Clear sinuses.  No acute orbital findings. Other: No mastoid effusions. IMPRESSION: 1. Multiple small acute infarcts involving the left basal ganglia, left frontal, parietal and occipital cortex and adjacent white matter. Slight edema without mass effect. 2. Advanced brain atrophy (ICD10-G31.9). and chronic microvascular ischemic disease. Electronically Signed   By: Margaretha Sheffield M.D.   On: 08/05/2022 18:42   CT HEAD CODE STROKE WO CONTRAST  Result Date: 08/05/2022 CLINICAL DATA:  Code stroke.  Neuro deficit, acute, stroke suspected EXAM: CT HEAD WITHOUT CONTRAST TECHNIQUE: Contiguous axial images were obtained from the base of the skull through the vertex  without intravenous contrast. RADIATION DOSE REDUCTION: This exam was performed according to the departmental dose-optimization program which includes automated exposure control, adjustment of the mA and/or kV according to patient size and/or use of iterative reconstruction technique. COMPARISON:  Apr 25, 2021 FINDINGS: Brain: Patchy white matter hypodensities, nonspecific but compatible with chronic microvascular ischemic disease. Similar chronic lacunar infarcts involving the basal ganglia and thalami. No evidence of acute large vascular territory infarct, acute hemorrhage, mass lesion or midline shift. Vascular: Calcific atherosclerosis. No hyperdense vessel identified. Skull: No acute fracture. Sinuses/Orbits: Clear sinuses.  No acute orbital findings. Other: No mastoid effusions. ASPECTS [facility] Stroke Program Early CT Score) total score (0-10 with 10 being normal): 10. IMPRESSION: 1. No evidence of acute abnormality. ASPECTS is 10. 2. Similar chronic microvascular ischemic disease. Findings discussed with Dr. Pearline Cables via telephone at 2:44 PM. Electronically Signed   By: Margaretha Sheffield M.D.   On: 08/05/2022 14:45       Kriste Broman M.D. [facility] 08/06/2022, 10:21 AM  Available via Epic secure chat  7am-7pm After 7 pm, please refer to night coverage provider listed on amion.

## 2022-08-06 NOTE — Progress Notes (Signed)
Inpatient Rehab Admissions Coordinator Note:   Per PT recommendations patient was screened for CIR candidacy by Michel Santee, PT. At this time, pt appears to be a potential candidate for CIR. I will place an order for rehab consult for full assessment, per our protocol.  Please contact me any with questions.Shann Medal, PT, DPT [phone] 08/06/22 3:28 PM

## 2022-08-07 DIAGNOSIS — N1832 Chronic kidney disease, stage 3b: Secondary | ICD-10-CM | POA: Diagnosis not present

## 2022-08-07 DIAGNOSIS — I639 Cerebral infarction, unspecified: Secondary | ICD-10-CM | POA: Diagnosis not present

## 2022-08-07 DIAGNOSIS — N179 Acute kidney failure, unspecified: Secondary | ICD-10-CM | POA: Diagnosis not present

## 2022-08-07 LAB — BASIC METABOLIC PANEL
Anion gap: 8 (ref 5–15)
BUN: 20 mg/dL (ref 8–23)
CO2: 22 mmol/L (ref 22–32)
Calcium: 9.1 mg/dL (ref 8.9–10.3)
Chloride: 106 mmol/L (ref 98–111)
Creatinine, Ser: 1.52 mg/dL — ABNORMAL HIGH (ref 0.61–1.24)
GFR, Estimated: 44 mL/min — ABNORMAL LOW (ref >60)
Glucose, Bld: 97 mg/dL (ref 70–99)
Potassium: 4 mmol/L (ref 3.5–5.1)
Sodium: 136 mmol/L (ref 135–145)

## 2022-08-07 LAB — CBC
HCT: 30 % — ABNORMAL LOW (ref 39.0–52.0)
Hemoglobin: 9.9 g/dL — ABNORMAL LOW (ref 13.0–17.0)
MCH: 28.8 pg (ref 26.0–34.0)
MCHC: 33 g/dL (ref 30.0–36.0)
MCV: 87.2 fL (ref 80.0–100.0)
Platelets: 173 10*3/uL (ref 150–400)
RBC: 3.44 MIL/uL — ABNORMAL LOW (ref 4.22–5.81)
RDW: 12.4 % (ref 11.5–15.5)
WBC: 5.3 10*3/uL (ref 4.0–10.5)
nRBC: 0 % (ref 0.0–0.2)

## 2022-08-07 NOTE — Progress Notes (Signed)
Inpatient Rehab Admissions:  Inpatient Rehab Consult received.  I met with patient at the bedside for rehabilitation assessment and to discuss goals and expectations of an inpatient rehab admission.  Discussed average length of stay, discharge home after completion of CIR, insurance authorization requirement. Pt acknowledged understanding. Pt interested in pursuing CIR. Pt gave permission to contact daughter [name]. Spoke with [name] on the telephone. She also acknowledged understanding of CIR goals and expectations. She is supportive of pt pursuing CIR. She confirmed that she will be able to provide 24/7 support for pt after discharge. Will continue to follow.  Signed: Gayland Curry, [place], [place] Admissions Coordinator [phone]

## 2022-08-07 NOTE — PMR Pre-admission (Signed)
PMR Admission Coordinator Pre-Admission Assessment  Patient: Carlos Shields is an 89 y.o., male MRN: [id number] DOB: 1931/06/07 Height: 5\' 4"  (162.6 cm) Weight: 51.7 kg  Insurance Information HMO: yes    PPO:      PCP:      IPA:      80/20:      OTHER:  PRIMARY: [employer] Medicare      Policy#: [id number]      Subscriber: patient CM Name:   ***    Phone#:  [phone] option #7    Fax#: [phone] Pre-Cert#: [id number]      Employer:  Benefits:  Phone #: [phone]     Name: 1/30 Eff. Date: 07/12/22     Deduct: none      Out of Pocket Max: $3600      Life Max: none CIR: $295 co pay per day days 1 until 5      SNF: no co pay per day days 1 until 20; $203 co pay per day days 21 until 100 Outpatient: $20 per visit     Co-Pay: visits per medical neccesity Home Health: 100%      Co-Pay: visits per medical neccesity DME: 80%     Co-Pay: 20% Providers: in-network  SECONDARY:       Policy#:      Phone#:   Development worker, community:       Phone#:   The Engineer[employer]" for patients in Inpatient Rehabilitation Facilities with attached "Privacy Act [place] Records" was provided and verbally reviewed with: Patient and Family  Emergency Contact Information Contact Information     Name Relation Home Work Mobile   Odem,[name] Daughter [phone]  [phone]      Current Medical History  Patient Admitting Diagnosis: CVA  History of Present Illness:  89 year old male with medical hx significant for: seizure disorder, h/o CVA, CKD stage IIIa, dysphagia, HTN. Pt presented to [facility] on 08/05/22 due to right-sided weakness and difficulty walking. Had missed a few doses of his Keppra and Asa last week, pt's son also died the previous week.Code stroke activated. Pt was not a candidate for tPA. CT head negative for acute abnormalities. MRI showed multiple acute strokes involving left basal ganglia, left frontal, parietal and occipital cortex and adjacent white  matter. MRA negative for LVO. Pt transferred to [facility] on 08/06/22.   Due to concern for possible breakthrough seizure, patient received additional IV Keppra and seizure precautions.  TO allow for initial permissive HTN. ON low dose Coreg currently and will up titrate as BP tolerates. Lovenox for CVTS prophylaxis.   MRA head and neck significant for intracranial atherosclerotic disease with associated moderate stenosis at the left carotid siphon, with additional severe distal left P3 stenosis. Vascular surgery consulted and plan for intervention 2/1.  Left TCAR on 2/1. No postoperative complications.  Complete NIHSS TOTAL: 0  Patient's medical record from [facility] has been reviewed by the rehabilitation admission coordinator and physician.  Past Medical History  Past Medical History:  Diagnosis Date   CKD (chronic kidney disease) stage 3, GFR 30-59 ml/min (HCC)    Gait abnormality    Hypertension    Pancreatitis    Seizures (HCC)    Venous insufficiency    Has the patient had major surgery during 100 days prior to admission? yes  Family History   family history includes Asthma in an other family member; Diabetes in an other family member; Hypertension  in an other family member.  Current Medications  Current Facility-Administered Medications:    0.9 %  sodium chloride infusion (Manually program via Guardrails IV Fluids), , Intravenous, Once, Rhyne, Samantha J, PA-C   0.9 %  sodium chloride infusion, 500 mL, Intravenous, Once PRN, Rhyne, Samantha J, PA-C   0.9 %  sodium chloride infusion, , Intravenous, Continuous, Rhyne, Samantha J, PA-C, Last Rate: 50 mL/hr at 08/12/22 1326, New Bag at 08/12/22 1326   acetaminophen (TYLENOL) tablet 650 mg, 650 mg, Oral, Q4H PRN **OR** acetaminophen (TYLENOL) 160 MG/5ML solution 650 mg, 650 mg, Per Tube, Q4H PRN **OR** acetaminophen (TYLENOL) suppository 650 mg, 650 mg, Rectal, Q4H PRN, Rhyne, Samantha  J, PA-C   alum & mag hydroxide-simeth (MAALOX/MYLANTA) 200-200-20 MG/5ML suspension 15-30 mL, 15-30 mL, Oral, Q2H PRN, Rhyne, Samantha J, PA-C   aspirin EC tablet 81 mg, 81 mg, Oral, Daily, Rhyne, Samantha J, PA-C, 81 mg at 08/13/22 4332   clopidogrel (PLAVIX) tablet 75 mg, 75 mg, Oral, Daily, Rhyne, Samantha J, PA-C, 75 mg at 08/13/22 0826   enoxaparin (LOVENOX) injection 30 mg, 30 mg, Subcutaneous, Q24H, Rhyne, Samantha J, PA-C, 30 mg at 08/13/22 0826   guaiFENesin-dextromethorphan (ROBITUSSIN DM) 100-10 MG/5ML syrup 15 mL, 15 mL, Oral, Q4H PRN, Rhyne, Samantha J, PA-C   hydrALAZINE (APRESOLINE) injection 5 mg, 5 mg, Intravenous, Q20 Min PRN, Rhyne, Samantha J, PA-C   HYDROmorphone (DILAUDID) injection 0.5 mg, 0.5 mg, Intravenous, Q4H PRN, Rhyne, Samantha J, PA-C   labetalol (NORMODYNE) injection 10 mg, 10 mg, Intravenous, Q10 min PRN, Rhyne, Samantha J, PA-C   levETIRAcetam (KEPPRA) tablet 500 mg, 500 mg, Oral, BID, Rhyne, Samantha J, PA-C, 500 mg at 08/13/22 9518   magnesium sulfate IVPB 2 g 50 mL, 2 g, Intravenous, Daily PRN, Rhyne, Samantha J, PA-C   metoprolol tartrate (LOPRESSOR) injection 2-5 mg, 2-5 mg, Intravenous, Q2H PRN, Rhyne, Samantha J, PA-C   metoprolol tartrate (LOPRESSOR) tablet 12.5 mg, 12.5 mg, Oral, BID, Rhyne, Samantha J, PA-C, 12.5 mg at 08/13/22 0827   ondansetron (ZOFRAN) injection 4 mg, 4 mg, Intravenous, Q6H PRN, Rhyne, Samantha J, PA-C   oxyCODONE (Oxy IR/ROXICODONE) immediate release tablet 5 mg, 5 mg, Oral, Q4H PRN, Rhyne, Samantha J, PA-C   pantoprazole (PROTONIX) EC tablet 40 mg, 40 mg, Oral, Daily, Rhyne, Samantha J, PA-C, 40 mg at 08/13/22 0827   phenol (CHLORASEPTIC) mouth spray 1 spray, 1 spray, Mouth/Throat, PRN, Rhyne, Samantha J, PA-C   potassium chloride SA (KLOR-CON M) CR tablet 20-40 mEq, 20-40 mEq, Oral, Daily PRN, Rhyne, Samantha J, PA-C   rosuvastatin (CRESTOR) tablet 10 mg, 10 mg, Oral, Daily, Rhyne, Samantha J, PA-C, 10 mg at 08/13/22  8416  Patients Current Diet:  Diet Order             Diet Heart Room service appropriate? Yes with Assist; Fluid consistency: Thin  Diet effective now                  Precautions / Restrictions Precautions Precautions: Fall, Other (comment) Precaution Comments: incontinent Restrictions Weight Bearing Restrictions: No   Has the patient had 2 or more falls or a fall with injury in the past year? Yes  Prior Activity Level Limited Community (1-2x/wk): doctor's appointments  Prior Functional Level Self Care: Did the patient need help bathing, dressing, using the toilet or eating? Independent  Indoor Mobility: Did the patient need assistance with walking from room to room (with or without device)? Independent  Stairs: Did the patient need assistance  with internal or external stairs (with or without device)? Independent  Functional Cognition: Did the patient need help planning regular tasks such as shopping or remembering to take medications? Independent  Patient Information Are you of Hispanic, Latino/a,or Spanish origin?: A. No, not of Hispanic, Latino/a, or Spanish origin What is your race?: B. Black or African American Do you need or want an interpreter to communicate with a doctor or health care staff?: 0. No  Patient's Response To:  Health Literacy and Transportation Is the patient able to respond to health literacy and transportation needs?: Yes Health Literacy - How often do you need to have someone help you when you read instructions, pamphlets, or other written material from your doctor or pharmacy?: Often In the past 12 months, has lack of transportation kept you from medical appointments or from getting medications?: No In the past 12 months, has lack of transportation kept you from meetings, work, or from getting things needed for daily living?: No  Home Assistive Devices / Equipment Home Assistive Devices/Equipment: Environmental consultant (specify type) Home Equipment: Tub  bench, Rolling Walker (2 wheels), Cane - single point, [employer] - quad, BSC/3in1  Prior Device Use: Indicate devices/aids used by the patient prior to current illness, exacerbation or injury? Walker  Current Functional Level Cognition  Arousal/Alertness: Awake/alert Overall Cognitive Status: No family/caregiver present to determine baseline cognitive functioning Current Attention Level: Sustained Orientation Level: Oriented to person, Oriented to place, Oriented to situation Following Commands: Follows one step commands with increased time Safety/Judgement: Decreased awareness of deficits, Decreased awareness of safety General Comments: Pt oriented to place and situation but not month or year when asked.  Increased time needed with mod instructional cueing for sequencing oral hygiene at the sink. Attention: Sustained Sustained Attention: Appears intact Memory: Impaired Memory Impairment: Retrieval deficit Awareness: Impaired Awareness Impairment: Emergent impairment Problem Solving: Impaired Problem Solving Impairment: Verbal basic    Extremity Assessment (includes Sensation/Coordination)  Upper Extremity Assessment: RUE deficits/detail RUE Deficits / Details: decreased AROM in RUE, theraband and squeeze ball provided to promote increased strength in RUE RUE Sensation: WNL RUE Coordination: decreased fine motor, decreased gross motor  Lower Extremity Assessment: Defer to PT evaluation RLE Deficits / Details: ROM WFL; MMT 4+/5 throughout RLE Sensation: WNL RLE Coordination: decreased gross motor, decreased fine motor (decreased coordination with heel shin and foot taps) LLE Deficits / Details: ROM WFL; MMT 5/5 throughout LLE Sensation: WNL LLE Coordination: WNL    ADLs  Overall ADL's : Needs assistance/impaired Eating/Feeding: Set up, Sitting Grooming: Oral care, Minimal assistance, Standing Upper Body Bathing: Minimal assistance, Sitting Lower Body Bathing: Sit to/from stand,  Moderate assistance Upper Body Dressing : Sitting, Minimal assistance Lower Body Dressing: Sit to/from stand, Maximal assistance Toilet Transfer: Moderate assistance, Stand-pivot, BSC/3in1, Ambulation, Rolling walker (2 wheels) Toilet Transfer Details (indicate cue type and reason): simulated as pt with condom cath and denies need to have BM Toileting- Clothing Manipulation and Hygiene: Moderate assistance, Sit to/from stand Toileting - Clothing Manipulation Details (indicate cue type and reason): for clothing Functional mobility during ADLs: Minimal assistance, Rolling walker (2 wheels) General ADL Comments: Pt with decreased awareness of deficits from CVAs when asked.  He needed increased time for processing and sequencing oral hygiene at the sink.  Mod instructional and questioning cueing for taking toothbrush out of wrapper that therapist had already opened and for applying toothpaste.  Increased ataxia and decreased coordination noted in the RUE compared to the left when attempting to manipulate and open the toothpaste  as well as when brushing.  Uses the RUE at an active assist level with increased time during session.    Mobility  Overal bed mobility: Needs Assistance Bed Mobility: Supine to Sit Supine to sit: Mod assist Sit to supine: Mod assist General bed mobility comments: Assist for bringing LEs off of the bed and scooting to the edge as well as support for lifting trunk up to sitting.  Mod assist for lifting LEs back in the bed and centering trunk.    Transfers  Overall transfer level: Needs assistance Equipment used: Rolling walker (2 wheels) Transfers: Sit to/from Stand Sit to Stand: Mod assist Bed to/from chair/wheelchair/BSC transfer type:: Step pivot Stand pivot transfers: Mod assist Step pivot transfers: Mod assist General transfer comment: Mod assist for sit to stand from the EOB with mod demonstrational cueing for hand placement.  Increased posterior lean with initial  standing.    Ambulation / Gait / Stairs / Wheelchair Mobility  Ambulation/Gait Ambulation/Gait assistance: Min assist, Mod assist Gait Distance (Feet): 115 Feet Assistive device: Rolling walker (2 wheels) Gait Pattern/deviations: Decreased stride length, Narrow base of support, Trunk flexed, Leaning posteriorly, Decreased step length - right, Decreased step length - left, Shuffle General Gait Details: verbal cues for wider BOS and increased stride length, in addition to walker proximity and upright posture, mod a to maintain balance and facilitate anterior weight shift, moments of light min a, pt unable o coordinate side steps to [facility] this date at end of session Gait velocity: decreased Gait velocity interpretation: <1.8 ft/sec, indicate of risk for recurrent falls    Posture / Balance Dynamic Sitting Balance Sitting balance - Comments: Pt able to maintain static sitting balance EOB in preparation for standing. Balance Overall balance assessment: Needs assistance Sitting-balance support: No upper extremity supported, Feet supported Sitting balance-Leahy Scale: Fair Sitting balance - Comments: Pt able to maintain static sitting balance EOB in preparation for standing. Standing balance support: During functional activity, Single extremity supported Standing balance-Leahy Scale: Poor Standing balance comment: Posterior LOB in standing.  He needs BUE support for functional mobility.    Special needs/care consideration Seizure precautions   Previous Home Environment  Living Arrangements: Children  Lives With: Daughter Available Help at Discharge: Family, Available 24 hours/day Type of Home: House Home Layout: One level Home Access: Stairs to enter Entrance Stairs-Rails: None Entrance Stairs-Number of Steps: 1 Bathroom Shower/Tub: Associate Professor: Yes How Accessible: Accessible via walker Home Care Services: No Additional Comments: Pt  somewhat questionable historian but information mostly agrees with prior admissions  Discharge Living Setting Plans for Discharge Living Setting: House (daughter's house) Type of Home at Discharge: House Discharge Home Layout: One level Discharge Home Access: Stairs to enter Entrance Stairs-Rails: None Entrance Stairs-Number of Steps: 1 Discharge Bathroom Shower/Tub: Tub/shower unit Discharge Bathroom Toilet: Standard Discharge Bathroom Accessibility: Yes How Accessible: Accessible via walker Does the patient have any problems obtaining your medications?: No  Social/Family/Support Systems Anticipated Caregiver: Nicole Cella, daughter Anticipated Caregiver's Contact Information: [phone] Caregiver Availability: 24/7 Discharge Plan Discussed with Primary Caregiver: Yes Is Caregiver In Agreement with Plan?: Yes Does Caregiver/Family have Issues with Lodging/Transportation while Pt is in Rehab?: No  Goals Patient/Family Goal for Rehab: supervision with PT, OT and SLP Expected length of stay: ELOS 10 to 12 days Pt/Family Agrees to Admission and willing to participate: Yes Program Orientation Provided & Reviewed with Pt/Caregiver Including Roles  & Responsibilities: Yes  Decrease burden of Care through IP rehab admission: NA  Possible need for SNF placement upon discharge: Not anticipated  Patient Condition: I have reviewed medical records from [facility], spoken with CM, and patient and daughter. I met with patient at the bedside and discussed via phone for inpatient rehabilitation assessment.  Patient will benefit from ongoing PT, OT, and SLP, can actively participate in 3 hours of therapy a day 5 days of the week, and can make measurable gains during the admission.  Patient will also benefit from the coordinated team approach during an Inpatient Acute Rehabilitation admission.  The patient will receive intensive therapy as well as Rehabilitation physician, nursing, social  worker, and care management interventions.  Due to bladder management, safety, skin/wound care, disease management, medication administration, pain management, and patient education the patient requires 24 hour a day rehabilitation nursing.  The patient is currently *** with mobility and basic ADLs.  Discharge setting and therapy post discharge at home with home health is anticipated.  Patient has agreed to participate in the Acute Inpatient Rehabilitation Program and will admit {Time; today/tomorrow:10263}.  Preadmission Screen Completed By:  Danne Baxter RN MSN with updates by ***  08/13/2022 5:00 PM ______________________________________________________________________   Discussed status with Dr. Marland Kitchen on *** at *** and received approval for admission today.  Admission Coordinator:  Danne Baxter RN MSN with updates by ***, time Marland KitchenSudie Grumbling ***   Assessment/Plan: Diagnosis: Does the need for close, 24 hr/day Medical supervision in concert with the patient's rehab needs make it unreasonable for this patient to be served in a less intensive setting? {yes_no_potentially:3041433} Co-Morbidities requiring supervision/potential complications: *** Due to {due ZO:1096045}, does the patient require 24 hr/day rehab nursing? {yes_no_potentially:3041433} Does the patient require coordinated care of a physician, rehab nurse, PT, OT, and SLP to address physical and functional deficits in the context of the above medical diagnosis(es)? {yes_no_potentially:3041433} Addressing deficits in the following areas: {deficits:3041436} Can the patient actively participate in an intensive therapy program of at least 3 hrs of therapy 5 days a week? {yes_no_potentially:3041433} The potential for patient to make measurable gains while on inpatient rehab is {potential:3041437} Anticipated functional outcomes upon discharge from inpatient rehab: {functional outcomes:304600100} PT, {functional outcomes:304600100} OT, {functional  outcomes:304600100} SLP Estimated rehab length of stay to reach the above functional goals is: *** Anticipated discharge destination: {anticipated dc setting:21604} 10. Overall Rehab/Functional Prognosis: {potential:3041437}   MD Signature: ***

## 2022-08-07 NOTE — Progress Notes (Signed)
Physical Therapy Treatment Patient Details Name: Carlos Shields MRN: [id number] DOB: [birth year] Today's Date: 08/07/2022   History of Present Illness Pt is 89 yo male admitted 08/05/22 with R sided weakness found to have small acute infarcts  involving the left basal ganglia, left frontal, parietal and occipital cortex and adjacent white matter.  Pt with hx of seizure on antiepileptics, CVA, hypertension, CKD stage IIIa, dysphagia    PT Comments    Patient progressing well towards PT goals. Pt asleep upon PT arrival but agreeable to mobility as he needed to use the bathroom. Requires Mod A for bed mobility and for transfers standing from EOB. Noted to have posterior and left lateral lean initially with narrow boS, this was improved towards end of session. Tolerated gait training with Min-Mod A for balance, RW management and forward momentum. Continues to be pleasant and motivated, wanting to return to sleep. Appropriate for AIR. Will follow.    Recommendations for follow up therapy are one component of a multi-disciplinary discharge planning process, led by the attending physician.  Recommendations may be updated based on patient status, additional functional criteria and insurance authorization.  Follow Up Recommendations  Acute inpatient rehab (3hours/day)     Assistance Recommended at Discharge Frequent or constant Supervision/Assistance  Patient can return home with the following A lot of help with walking and/or transfers;A lot of help with bathing/dressing/bathroom   Equipment Recommendations  None recommended by PT    Recommendations for Other Services       Precautions / Restrictions Precautions Precautions: Fall;Other (comment) Precaution Comments: incontinent Restrictions Weight Bearing Restrictions: No     Mobility  Bed Mobility Overal bed mobility: Needs Assistance Bed Mobility: Supine to Sit, Sit to Supine     Supine to sit: Mod assist, HOB elevated Sit to supine: Mod  assist   General bed mobility comments: Assist with trunk and to scoot bottom to EOB, assist to return to supine bringing LEs into bed    Transfers Overall transfer level: Needs assistance Equipment used: Rolling walker (2 wheels) Transfers: Sit to/from Stand Sit to Stand: Mod assist, Min assist           General transfer comment: Min-Mod A to stand from EOB x1, from toilet x1 with cues for hand placement/foot placement, posterior bias initially with left lateral lean and narrow BoS.    Ambulation/Gait Ambulation/Gait assistance: Mod assist, Min assist Gait Distance (Feet): 15 Feet (x2 bouts) Assistive device: Rolling walker (2 wheels) Gait Pattern/deviations: Decreased stride length, Narrow base of support, Trunk flexed, Leaning posteriorly, Decreased step length - right, Decreased step length - left, Shuffle Gait velocity: decreased Gait velocity interpretation: <1.31 ft/sec, indicative of household ambulator   General Gait Details: Slow, short shuffling steps with narrow boS with initial posterior and left lateral lean, improved on return back to bed with less leaning, cues to increase step lengths. Flexed trunk at times using RW.   Stairs             Wheelchair Mobility    Modified Rankin (Stroke Patients Only) Modified Rankin (Stroke Patients Only) Pre-Morbid Rankin Score: Slight disability Modified Rankin: Moderately severe disability     Balance Overall balance assessment: Needs assistance Sitting-balance support: No upper extremity supported, Feet supported Sitting balance-Leahy Scale: Fair Sitting balance - Comments: posterior lean initially needing Min A progessing to Min guard   Standing balance support: During functional activity, Single extremity supported Standing balance-Leahy Scale: Poor Standing balance comment: Requiring RW or UE support for balance  Cognition Arousal/Alertness: Awake/alert Behavior  During Therapy: WFL for tasks assessed/performed Overall Cognitive Status: No family/caregiver present to determine baseline cognitive functioning Area of Impairment: Awareness, Safety/judgement, Orientation, Problem solving                         Safety/Judgement: Decreased awareness of safety, Decreased awareness of deficits Awareness: Emergent Problem Solving: Slow processing, Requires verbal cues, Requires tactile cues          Exercises      General Comments General comments (skin integrity, edema, etc.): VSS on RA.      Pertinent Vitals/Pain Pain Assessment Pain Assessment: No/denies pain    Home Living   Living Arrangements: Children Available Help at Discharge: Family;Available 24 hours/day Type of Home: House Home Access: Stairs to enter Entrance Stairs-Rails: None Entrance Stairs-Number of Steps: 1   Home Layout: One level        Prior Function            PT Goals (current goals can now be found in the care plan section) Progress towards PT goals: Progressing toward goals    Frequency    Min 4X/week      PT Plan Current plan remains appropriate    Co-evaluation              AM-PAC PT "6 Clicks" Mobility   Outcome Measure  Help needed turning from your back to your side while in a flat bed without using bedrails?: A Little Help needed moving from lying on your back to sitting on the side of a flat bed without using bedrails?: A Lot Help needed moving to and from a bed to a chair (including a wheelchair)?: A Lot Help needed standing up from a chair using your arms (e.g., wheelchair or bedside chair)?: A Lot Help needed to walk in hospital room?: A Lot Help needed climbing 3-5 steps with a railing? : A Lot 6 Click Score: 13    End of Session Equipment Utilized During Treatment: Gait belt Activity Tolerance: Patient tolerated treatment well Patient left: in bed;with call bell/phone within reach;with bed alarm set Nurse  Communication: Mobility status PT Visit Diagnosis: Other abnormalities of gait and mobility (R26.89);Hemiplegia and hemiparesis Hemiplegia - Right/Left: Right Hemiplegia - dominant/non-dominant: Dominant Hemiplegia - caused by: Cerebral infarction     Time: 5277-8242 PT Time Calculation (min) (ACUTE ONLY): 22 min  Charges:  $Therapeutic Activity: 8-22 mins                     Marisa Severin, PT, DPT Acute Rehabilitation Services Secure chat preferred Office [phone]      Marguarite Arbour A Sabra Heck 08/07/2022, 2:37 PM

## 2022-08-07 NOTE — Progress Notes (Addendum)
STROKE TEAM PROGRESS NOTE   INTERVAL HISTORY No family at the bedside.   Admitted for stroke workup after MRI found multiple small acute infarcts.   Patient has history of seizures, recent missed doses of AED. Keppra load given in ED, 500mg  BID continued.   Vitals:   08/06/22 2040 08/07/22 0358 08/07/22 0717 08/07/22 1136  BP: (!) 159/82 (!) 166/84 (!) 170/95 (!) 152/80  Pulse: 74 70 81 76  Resp: 18 18 18 18   Temp: 97.8 F (36.6 C) 98 F (36.7 C) 98 F (36.7 C) 97.9 F (36.6 C)  TempSrc: Oral Oral Oral Oral  SpO2: 100% 100% 100% 99%  Weight:      Height:       CBC:  Recent Labs  Lab 08/05/22 1428 08/05/22 1434 08/07/22 0348  WBC 7.1  --  5.3  NEUTROABS 4.0  --   --   HGB 9.5* 10.9* 9.9*  HCT 30.0* 32.0* 30.0*  MCV 89.3  --  87.2  PLT 187  --  921   Basic Metabolic Panel:  Recent Labs  Lab 08/05/22 1428 08/05/22 1434 08/07/22 0348  NA 136 138 136  K 4.5 4.7 4.0  CL 104 105 106  CO2 23  --  22  GLUCOSE 120* 116* 97  BUN 36* 37* 20  CREATININE 2.02* 2.10* 1.52*  CALCIUM 9.1  --  9.1   Lipid Panel:  Recent Labs  Lab 08/06/22 0523  CHOL 143  TRIG 27  HDL 69  CHOLHDL 2.1  VLDL 5  LDLCALC 69   HgbA1c:  Recent Labs  Lab 08/05/22 1428  HGBA1C 5.3   Urine Drug Screen:  Recent Labs  Lab 08/05/22 1426  LABOPIA NONE DETECTED  COCAINSCRNUR NONE DETECTED  LABBENZ NONE DETECTED  AMPHETMU NONE DETECTED  THCU NONE DETECTED  LABBARB NONE DETECTED    Alcohol Level  Recent Labs  Lab 08/05/22 1428  ETH <10    IMAGING past 24 hours No results found.  PHYSICAL EXAM General: lying in bed, NAD Pulm: unlabored breathing, no supplemental oxygen needed.  Cardiac:RRR Abdomen: soft, no guarding.  Ext: No cyanosis or edema. No bruising or open wounds.   Aox3. No aphasia present, follows commands, able to name and repeat.  Dysarthria present. RUE: 4-/5 RLE: 3/5 Sensation intact and symmetrical FTN intact, slow on right. Decreased RAM on right.    ASSESSMENT/PLAN Mr. Carlos Shields is a 89 y.o. male with history of history of seizure on AEDs, CVA, hypertension, CKD stage IIIa, dysphagia who presented with right-sided weakness.  Patient's daughter reported that normally ambulates with a walker.  On the day of admission, around 1 to 2 PM, he wet his bed which was unusual for him, she saw him falling out of the bed and seemed to be dragging his leg.  Right hand appeared twisted so the daughter thought that he was having a seizure and called EMS.  He had missed few doses of his Keppra due to issues with pharmacy and ran out of aspirin for a week.  Patient's son also had died the previous week.  LKW: 1300/1400 08/05/2022 TNK: no, outside of window IR Thrombectomy? No,  Modified Rankin Scale: 4-Needs assistance to walk and tend to bodily needs  Acute Ischemic Strokes, multiple locations Etiology:  missed Aspirin doses  vs Embolic (no history of Afib) Code Stroke CT head:  No acute abnormality.   ASPECTS 10.    MRI Multiple small acute infarcts involving the left basal ganglia, left frontal,  parietal and occipital cortex and adjacent white matter. Slight edema without mass effect. Advanced brain atrophy and chronic microvascular ischemic disease MRA Negative intracranial MRA for large vessel occlusion. Intracranial atherosclerotic disease with associated moderate stenosis at the left carotid siphon, with additional severe distal left P3 stenosis. No other proximal high-grade or correctable stenosis. Recommend Vascular consult  2D Echo preserved EF, no evidence of IA shunt   LDL 69 HgbA1c 5.3 VTE prophylaxis - lovenox    Diet   Diet Heart Room service appropriate? Yes; Fluid consistency: Thin   aspirin 81 mg daily prior to admission, now on aspirin 81 mg daily. Recommend adding Plavix.  Therapy recommendations:  AIR Disposition:  pending  Hypertension Home meds:  norvasc Permissive hypertension (OK if < 220/120) but gradually  normalize in 5-7 days Long-term BP goal normotensive  Hyperlipidemia Home meds:  none LDL 69, goal < 70 High intensity statin not indicated    Other Stroke Risk Factors Advanced Age >/= 87   Other Active Problems, per Primary Team Seizures Continue Keppra 500mg  BID Seizure Precautions Acute on Chronic CKD, Stage 3a Chronic Normocytic Anemia Abnormal TSH  Hospital day # 1   Pt seen by Neuro NP/APP and later by MD. Note/plan to be edited by MD as needed.    Otelia Santee, DNP, AGACNP-BC [facility] Please use AMION for pager and EPIC for messaging  ATTENDING ATTESTATION:  89 year old with left basal ganglia frontal and parietal strokes on MRI.  CTA shows 60 to 79% stenosis bilaterally.  Currently on aspirin and will consider Plavix but will wait until vascular consult is completed.  Exam as above.  Continue seizure medications of Keppra 500 mg twice daily.  Dr. Reeves Forth evaluated pt independently, reviewed imaging, chart, labs. Discussed and formulated plan with the Resident/APP. Changes were made to the note where appropriate. Please see APP/resident note above for details.     Kamarii Buren,MD    To contact Stroke Continuity provider, please refer to [email or web address] After hours, contact General Neurology

## 2022-08-07 NOTE — Progress Notes (Signed)
[facility]                                                                              Hunter Bachar, is a 89 y.o. male, DOB - Feb 16, 1931, GHW:[id number] Admit date - 08/05/2022    Outpatient Primary MD for the patient is Norm Salt, PA  LOS - 1  days  Chief Complaint  Patient presents with   Code Stroke   Seizures       Brief summary   Patient is a 89 year old male with history of seizure on antiepileptics, CVA, hypertension, CKD stage IIIa, dysphagia who presented with right-sided weakness.  Patient's daughter reported that normally ambulates with a walker.  On the day of admission, around 1 to 2 PM, he wet his bed which was unusual for him, she saw him falling out of the bed and seemed to be dragging his leg.  Right hand appeared twisted so the daughter thought that he was having a seizure and called EMS.  He had missed few doses of his Keppra due to issues with pharmacy and ran out of aspirin for a week.  Patient's son also had died the previous week.  CT head was negative.  No tPA or thrombectomy as he was outside window.  Patient was evaluated over telemetry by Dr. Amada Jupiter, recommended MRI brain.  Patient received additional Keppra MRI brain showed multiple small acute infarcts involving the left basal ganglia, left frontal, parietal and occipital cortex and adjacent white matter.  Slight edema without mass effect. Admitted for further workup.  Assessment & Plan     Acute CVA (cerebral vascular accident) (HCC) with prior history of CVA -MRI brain showed multiple small acute infarct involving the left basal ganglia, left frontal, right occipital cortex and adjacent white matter.  Slight edema without mass effect.  Distribution of stroke appears to be embolic but no prior history of A-fib. -Continue telemetry, had missed aspirin for past week, was resumed -MRA showed negative intracranial MRA for large vessel occlusion.  Intracranial atherosclerotic  disease with associated moderate stenosis at left carotid siphon with additional severe distal left P3 stenosis. -Permissive hypertension, holding amlodipine, metoprolol -2D echo with a preserved EF, no evidence of IA shunt -Passed SLP evaluation, started on heart healthy diet -Lipid panel showed cholesterol 143, LDL 69. -PT/OT consulted, recommendation for CIR  Seizures (HCC) -Due to concern for possible breakthrough seizure, patient received additional 1 g IV Keppra in ED -Continue Keppra 500 mg twice daily -Seizure precautions  Acute on chronic CKD, stage IIIa -Baseline around 1.2, peaked at 2.1, it is 1.52 today. -Creatinine 2.0 on admission, trended up to 2.1 today, placed on gentle IV fluid hydration -Renal ultrasound significant for chronic medical disease, no hydronephrosis  HTN (hypertension) -Continue permissive hypertension in the setting of acute CVA  Chronic normocytic anemia -Hemoglobin baseline 9-10 -Currently at baseline   Abnormal TSH -TSH 4.9, obtain free T4     Acute metabolic encephalopathy -Likely due to #1  Estimated body mass index is 19.56 kg/m as calculated from the following:   Height as of this encounter: 5\' 4"  (1.626 m).   Weight as  of this encounter: 51.7 kg.  Code Status: Full CODE STATUS DVT Prophylaxis:  enoxaparin (LOVENOX) injection 30 mg Start: 08/05/22 2200   Level of Care: Level of care: Telemetry Medical Family Communication: None at bedside Disposition Plan:      Remains inpatient appropriate: Stroke workup in progress, awaiting transfer to [facility]   Procedures:  MRI brain, MRA  Consultants:   Neurology  Antimicrobials: None   Medications  aspirin EC  81 mg Oral Daily   enoxaparin (LOVENOX) injection  30 mg Subcutaneous Q24H   levETIRAcetam  500 mg Oral BID   pantoprazole  40 mg Oral Daily      Subjective:   [name] was seen and examined, denies any complaints today, no chest pain, no vomiting, no shortness of  breath.     Objective:   Vitals:   08/06/22 2040 08/07/22 0358 08/07/22 0717 08/07/22 1136  BP: (!) 159/82 (!) 166/84 (!) 170/95 (!) 152/80  Pulse: 74 70 81 76  Resp: 18 18 18 18   Temp: 97.8 F (36.6 C) 98 F (36.7 C) 98 F (36.7 C) 97.9 F (36.6 C)  TempSrc: Oral Oral Oral Oral  SpO2: 100% 100% 100% 99%  Weight:      Height:        Intake/Output Summary (Last 24 hours) at 08/07/2022 1234 Last data filed at 08/06/2022 1700 Gross per 24 hour  Intake --  Output 250 ml  Net -250 ml     Wt Readings from Last 3 Encounters:  08/05/22 51.7 kg  06/16/22 51.7 kg  04/25/21 54.4 kg     Exam  Awake Alert, is not, in no apparent distress Symmetrical Chest wall movement, Good air movement bilaterally, CTAB RRR,No Gallops,Rubs or new Murmurs, No Parasternal Heave +ve B.Sounds, Abd Soft, No tenderness, No rebound - guarding or rigidity. No Cyanosis, Clubbing or edema, No new Rash or bruise     Data Reviewed:  I have personally reviewed following labs    CBC Lab Results  Component Value Date   WBC 5.3 08/07/2022   RBC 3.44 (L) 08/07/2022   HGB 9.9 (L) 08/07/2022   HCT 30.0 (L) 08/07/2022   MCV 87.2 08/07/2022   MCH 28.8 08/07/2022   PLT 173 08/07/2022   MCHC 33.0 08/07/2022   RDW 12.4 08/07/2022   LYMPHSABS 2.3 08/05/2022   MONOABS 0.8 08/05/2022   EOSABS 0.0 08/05/2022   BASOSABS 0.1 [id number]     Last metabolic panel Lab Results  Component Value Date   NA 136 08/07/2022   K 4.0 08/07/2022   CL 106 08/07/2022   CO2 22 08/07/2022   BUN 20 08/07/2022   CREATININE 1.52 (H) 08/07/2022   GLUCOSE 97 08/07/2022   GFRNONAA 44 (L) 08/07/2022   GFRAA 51 (L) 11/19/2019   CALCIUM 9.1 08/07/2022   PHOS 2.9 05/04/2013   PROT 7.8 08/05/2022   ALBUMIN 4.3 08/05/2022   BILITOT 1.0 08/05/2022   ALKPHOS 66 08/05/2022   AST 29 08/05/2022   ALT 13 08/05/2022   ANIONGAP 8 08/07/2022    CBG (last 3)  Recent Labs    08/05/22 1422  GLUCAP 92      Coagulation  Profile: Recent Labs  Lab 08/05/22 1428  INR 1.3*     Radiology Studies: I have personally reviewed the imaging studies  ECHOCARDIOGRAM COMPLETE  Result Date: 08/06/2022    ECHOCARDIOGRAM REPORT   Patient Name:   Carlos Shields  Date of Exam: 08/06/2022 Medical Rec #:  [id number]  Height:  64.0 in Accession #:    [id number] Weight:       114.0 lb Date of Birth:  Dec 04, 1930  BSA:          1.541 m Patient Age:    89 years   BP:           129/72 mmHg Patient Gender: M          HR:           69 bpm. Exam Location:  Inpatient Procedure: 2D Echo, Cardiac Doppler and Color Doppler Indications:    Stroke I63.9  History:        Patient has no prior history of Echocardiogram examinations.                 Stroke; Signs/Symptoms:Syncope. CKD, stage III.  Sonographer:    Ronny Flurry Referring Phys: [id number] [place] T TU IMPRESSIONS  1. Left ventricular ejection fraction, by estimation, is 60 to 65%. The left ventricle has normal function. The left ventricle has no regional wall motion abnormalities. Left ventricular diastolic parameters are consistent with Grade I diastolic dysfunction (impaired relaxation).  2. Right ventricular systolic function is normal. The right ventricular size is normal.  3. The mitral valve is abnormal. Trivial mitral valve regurgitation. No evidence of mitral stenosis.  4. The aortic valve is tricuspid. There is mild calcification of the aortic valve. There is mild thickening of the aortic valve. Aortic valve regurgitation is not visualized. Aortic valve sclerosis is present, with no evidence of aortic valve stenosis.  5. The inferior vena cava is normal in size with greater than 50% respiratory variability, suggesting right atrial pressure of 3 mmHg. FINDINGS  Left Ventricle: Left ventricular ejection fraction, by estimation, is 60 to 65%. The left ventricle has normal function. The left ventricle has no regional wall motion abnormalities. The left ventricular internal cavity size was  normal in size. There is  no left ventricular hypertrophy. Left ventricular diastolic parameters are consistent with Grade I diastolic dysfunction (impaired relaxation). Right Ventricle: The right ventricular size is normal. No increase in right ventricular wall thickness. Right ventricular systolic function is normal. Left Atrium: Left atrial size was normal in size. Right Atrium: Right atrial size was normal in size. Pericardium: There is no evidence of pericardial effusion. Mitral Valve: The mitral valve is abnormal. There is mild thickening of the mitral valve leaflet(s). There is mild calcification of the mitral valve leaflet(s). Trivial mitral valve regurgitation. No evidence of mitral valve stenosis. Tricuspid Valve: The tricuspid valve is normal in structure. Tricuspid valve regurgitation is not demonstrated. No evidence of tricuspid stenosis. Aortic Valve: The aortic valve is tricuspid. There is mild calcification of the aortic valve. There is mild thickening of the aortic valve. Aortic valve regurgitation is not visualized. Aortic valve sclerosis is present, with no evidence of aortic valve stenosis. Aortic valve mean gradient measures 3.0 mmHg. Aortic valve peak gradient measures 6.7 mmHg. Aortic valve area, by VTI measures 2.30 cm. Pulmonic Valve: The pulmonic valve was normal in structure. Pulmonic valve regurgitation is not visualized. No evidence of pulmonic stenosis. Aorta: The aortic root is normal in size and structure. Venous: The inferior vena cava is normal in size with greater than 50% respiratory variability, suggesting right atrial pressure of 3 mmHg. IAS/Shunts: No atrial level shunt detected by color flow Doppler.  LEFT VENTRICLE PLAX 2D LVOT diam:     2.20 cm   Diastology LV SV:         59  LV e' medial:    6.42 cm/s LV SV Index:   38        LV E/e' medial:  9.8 LVOT Area:     3.80 cm  LV e' lateral:   7.40 cm/s                          LV E/e' lateral: 8.5  RIGHT VENTRICLE             IVC RV S prime:     7.46 cm/s  IVC diam: 1.10 cm TAPSE (M-mode): 1.5 cm LEFT ATRIUM             Index        RIGHT ATRIUM          Index LA Vol (A2C):   24.0 ml 15.58 ml/m  RA Area:     7.28 cm LA Vol (A4C):   16.9 ml 10.97 ml/m  RA Volume:   11.40 ml 7.40 ml/m LA Biplane Vol: 20.9 ml 13.57 ml/m  AORTIC VALVE AV Area (Vmax):    2.36 cm AV Area (Vmean):   2.36 cm AV Area (VTI):     2.30 cm AV Vmax:           129.00 cm/s AV Vmean:          86.700 cm/s AV VTI:            0.258 m AV Peak Grad:      6.7 mmHg AV Mean Grad:      3.0 mmHg LVOT Vmax:         79.95 cm/s LVOT Vmean:        53.800 cm/s LVOT VTI:          0.156 m LVOT/AV VTI ratio: 0.60  AORTA Ao Root diam: 3.30 cm Ao Asc diam:  3.10 cm MITRAL VALVE MV Area (PHT): 1.98 cm    SHUNTS MV Decel Time: 384 msec    Systemic VTI:  0.16 m MV E velocity: 62.60 cm/s  Systemic Diam: 2.20 cm MV A velocity: 89.10 cm/s MV E/A ratio:  0.70 Jenkins Rouge MD Electronically signed by Jenkins Rouge MD Signature Date/Time: 08/06/2022/12:37:27 PM    Final    US RENAL  Result Date: 08/06/2022 CLINICAL DATA:  Acute kidney injury EXAM: RENAL / URINARY TRACT ULTRASOUND COMPLETE COMPARISON:  CT abdomen 07/02/2022 FINDINGS: Right Kidney: Renal measurements: 10.4 by 6.0 by 4.6 cm = volume: 150 mL. Echogenic parenchyma compatible with chronic medical renal disease. No solid mass or hydronephrosis visualized. Small right kidney lower pole cyst seen on image 13 series 1. Left Kidney: Renal measurements: 10.2 by 5.3 by 4.7 cm = volume: 134 mL. Echogenic parenchyma compatible with chronic medical renal disease. No mass or hydronephrosis visualized. The 4 mm left kidney upper pole calculus shown on the CT from 07/02/2022 is well seen today. Bladder: Appears normal for degree of bladder distention. Other: None. IMPRESSION: 1. Bilateral echogenic renal parenchyma compatible with chronic medical renal disease. 2. No hydronephrosis. 3. The 4 mm left renal calculus shown on recent CT is  not readily apparent on today's ultrasound exam. It may have passed or simply be occult sonographically. The technologist notes suboptimal images due to patient breathing and overlying bowel gas. Electronically Signed   By: Van Clines M.D.   On: 08/06/2022 12:22   VAS US CAROTID (at [facility] and [facility] only)  Result Date: 08/06/2022 Carotid Arterial Duplex Study Patient Name:  [name]  Date of Exam:   08/06/2022 Medical Rec #: [id number]  Accession #:    [id number] Date of Birth: 1931/04/26  Patient Gender: M Patient Age:   89 years Exam Location:  [facility] Procedure:      VAS US CAROTID Referring Phys: CHING TU --------------------------------------------------------------------------------  Indications:   CVA. Risk Factors:  Hypertension. Other Factors: Seizures. Performing Technologist: Darlin Coco RDMS, RVT  Examination Guidelines: A complete evaluation includes B-mode imaging, spectral Doppler, color Doppler, and power Doppler as needed of all accessible portions of each vessel. Bilateral testing is considered an integral part of a complete examination. Limited examinations for reoccurring indications may be performed as noted.  Right Carotid Findings: +----------+--------+--------+--------+---------------------------+--------+           PSV cm/sEDV cm/sStenosisPlaque Description         Comments +----------+--------+--------+--------+---------------------------+--------+ CCA Prox  57      15                                                  +----------+--------+--------+--------+---------------------------+--------+ CCA Distal49      13                                                  +----------+--------+--------+--------+---------------------------+--------+ ICA Prox  226     74      60-79%  hypoechoic and heterogenous         +----------+--------+--------+--------+---------------------------+--------+ ICA Mid   166     49                                          tortuous +----------+--------+--------+--------+---------------------------+--------+ ICA Distal64      20                                                  +----------+--------+--------+--------+---------------------------+--------+ ECA       37                                                          +----------+--------+--------+--------+---------------------------+--------+ +----------+--------+-------+---------+-------------------+           PSV cm/sEDV cmsDescribe Arm Pressure (mmHG) +----------+--------+-------+---------+-------------------+ [id number]             Turbulent                    +----------+--------+-------+---------+-------------------+ +---------+--------+--+--------+--+---------+ VertebralPSV cm/s48EDV cm/s15Antegrade +---------+--------+--+--------+--+---------+  Left Carotid Findings: +---------+--------+-------+--------+------------------------+-----------------+          PSV cm/sEDV    StenosisPlaque Description      Comments                           cm/s                                                     +---------+--------+-------+--------+------------------------+-----------------+  CCA Prox 71      9                                      intimal                                                                   thickening        +---------+--------+-------+--------+------------------------+-----------------+ CCA      41      7                                                        Distal                                                                    +---------+--------+-------+--------+------------------------+-----------------+ ICA Prox 255     75     60-79%  heterogenous and                                                          irregular                                 +---------+--------+-------+--------+------------------------+-----------------+ ICA Mid  107     22                                      tortuous          +---------+--------+-------+--------+------------------------+-----------------+ ICA      80      20                                                       Distal                                                                    +---------+--------+-------+--------+------------------------+-----------------+ ECA      37                                                               +---------+--------+-------+--------+------------------------+-----------------+ +----------+--------+--------+---------+-------------------+  PSV cm/sEDV cm/sDescribe Arm Pressure (mmHG) +----------+--------+--------+---------+-------------------+ Subclavian221             Turbulent                    +----------+--------+--------+---------+-------------------+ +---------+--------+--+--------+--+---------+ VertebralPSV cm/s44EDV cm/s12Antegrade +---------+--------+--+--------+--+---------+   Summary: Right Carotid: Velocities in the right ICA are consistent with a 60-79%                stenosis. Left Carotid: Velocities in the left ICA are consistent with a 60-79% stenosis. Vertebrals:  Bilateral vertebral arteries demonstrate antegrade flow. Subclavians: Bilateral subclavian artery flow was disturbed. *See table(s) above for measurements and observations.     Preliminary    MR ANGIO HEAD WO CONTRAST  Result Date: 08/06/2022 CLINICAL DATA:  Follow-up examination for stroke. EXAM: MRA HEAD WITHOUT CONTRAST TECHNIQUE: Angiographic images of the Circle of Willis were acquired using MRA technique without intravenous contrast. COMPARISON:  Prior brain MRI from earlier the same day. FINDINGS: Anterior circulation: Visualized distal cervical segments of the internal carotid arteries are patent with antegrade flow. Atheromatous irregularity within the carotid siphons with associated moderate stenosis at the anterior genu of the left carotid siphon  (series 10003, image 7). A1 segments, anterior communicating complex common anterior cerebral arteries patent bilaterally. Both M1 segments patent. Left M1 bifurcates early. No proximal MCA branch occlusion or high-grade stenosis. Distal MCA branches perfused and symmetric. Posterior circulation: Atheromatous irregularity within the left V4 segment without high-grade stenosis. Right vertebral artery dominant and widely patent. Right vertebral artery dominant. Both PICA patent. Basilar widely patent without stenosis. Superior cerebellar arteries patent bilaterally. Both PCAs primarily supplied via the basilar, although a prominent left posterior is noted. Right PCA patent to its distal aspect without stenosis. Probable focal severe distal left P3 stenosis noted (series 1028, image 27). Atheromatous irregularity within the left P2 segment without high-grade stenosis. Anatomic variants: None significant. Other: No intracranial aneurysm. IMPRESSION: 1. Negative intracranial MRA for large vessel occlusion. 2. Intracranial atherosclerotic disease with associated moderate stenosis at the left carotid siphon, with additional severe distal left P3 stenosis. No other proximal high-grade or correctable stenosis Electronically Signed   By: Jeannine Boga M.D.   On: 08/06/2022 00:02   MR BRAIN WO CONTRAST  Result Date: 08/05/2022 CLINICAL DATA:  Neuro deficit, acute, stroke suspected EXAM: MRI HEAD WITHOUT CONTRAST TECHNIQUE: Multiplanar, multiecho pulse sequences of the brain and surrounding structures were obtained without intravenous contrast. COMPARISON:  Same day CT head. FINDINGS: Brain: Multiple small acute infarcts involving the left basal ganglia, left frontal, parietal and occipital cortex and adjacent white matter. Slight edema without mass effect. Additional scattered T2/FLAIR hyperintensities within the white matter, compatible with chronic microvascular ischemic disease. No evidence of acute hemorrhage,  mass lesion, midline shift or fall hydrocephalus. Cerebral atrophy. Vascular: Major arterial flow voids are maintained. Skull and upper cervical spine: Normal marrow signal. Sinuses/Orbits: Clear sinuses.  No acute orbital findings. Other: No mastoid effusions. IMPRESSION: 1. Multiple small acute infarcts involving the left basal ganglia, left frontal, parietal and occipital cortex and adjacent white matter. Slight edema without mass effect. 2. Advanced brain atrophy (ICD10-G31.9). and chronic microvascular ischemic disease. Electronically Signed   By: Margaretha Sheffield M.D.   On: 08/05/2022 18:42   CT HEAD CODE STROKE WO CONTRAST  Result Date: 08/05/2022 CLINICAL DATA:  Code stroke.  Neuro deficit, acute, stroke suspected EXAM: CT HEAD WITHOUT CONTRAST TECHNIQUE: Contiguous axial images were obtained from the base of the skull through the vertex  without intravenous contrast. RADIATION DOSE REDUCTION: This exam was performed according to the departmental dose-optimization program which includes automated exposure control, adjustment of the mA and/or kV according to patient size and/or use of iterative reconstruction technique. COMPARISON:  Apr 25, 2021 FINDINGS: Brain: Patchy white matter hypodensities, nonspecific but compatible with chronic microvascular ischemic disease. Similar chronic lacunar infarcts involving the basal ganglia and thalami. No evidence of acute large vascular territory infarct, acute hemorrhage, mass lesion or midline shift. Vascular: Calcific atherosclerosis. No hyperdense vessel identified. Skull: No acute fracture. Sinuses/Orbits: Clear sinuses.  No acute orbital findings. Other: No mastoid effusions. ASPECTS [facility] Stroke Program Early CT Score) total score (0-10 with 10 being normal): 10. IMPRESSION: 1. No evidence of acute abnormality. ASPECTS is 10. 2. Similar chronic microvascular ischemic disease. Findings discussed with Dr. Pearline Cables via telephone at 2:44 PM. Electronically Signed    By: Margaretha Sheffield M.D.   On: 08/05/2022 14:45       Phillips Climes M.D. [facility] 08/07/2022, 12:34 PM  Available via Epic secure chat 7am-7pm After 7 pm, please refer to night coverage provider listed on amion.

## 2022-08-08 ENCOUNTER — Other Ambulatory Visit (HOSPITAL_COMMUNITY): Payer: Medicare Other

## 2022-08-08 ENCOUNTER — Inpatient Hospital Stay (HOSPITAL_COMMUNITY): Payer: Medicare Other

## 2022-08-08 DIAGNOSIS — N1832 Chronic kidney disease, stage 3b: Secondary | ICD-10-CM | POA: Diagnosis not present

## 2022-08-08 DIAGNOSIS — G9341 Metabolic encephalopathy: Secondary | ICD-10-CM | POA: Diagnosis not present

## 2022-08-08 DIAGNOSIS — I639 Cerebral infarction, unspecified: Secondary | ICD-10-CM | POA: Diagnosis not present

## 2022-08-08 DIAGNOSIS — I6522 Occlusion and stenosis of left carotid artery: Secondary | ICD-10-CM

## 2022-08-08 DIAGNOSIS — N179 Acute kidney failure, unspecified: Secondary | ICD-10-CM | POA: Diagnosis not present

## 2022-08-08 LAB — CBC
HCT: 29 % — ABNORMAL LOW (ref 39.0–52.0)
Hemoglobin: 9.5 g/dL — ABNORMAL LOW (ref 13.0–17.0)
MCH: 28.9 pg (ref 26.0–34.0)
MCHC: 32.8 g/dL (ref 30.0–36.0)
MCV: 88.1 fL (ref 80.0–100.0)
Platelets: 183 10*3/uL (ref 150–400)
RBC: 3.29 MIL/uL — ABNORMAL LOW (ref 4.22–5.81)
RDW: 12.3 % (ref 11.5–15.5)
WBC: 7.1 10*3/uL (ref 4.0–10.5)
nRBC: 0 % (ref 0.0–0.2)

## 2022-08-08 LAB — BASIC METABOLIC PANEL
Anion gap: 7 (ref 5–15)
BUN: 21 mg/dL (ref 8–23)
CO2: 22 mmol/L (ref 22–32)
Calcium: 8.8 mg/dL — ABNORMAL LOW (ref 8.9–10.3)
Chloride: 106 mmol/L (ref 98–111)
Creatinine, Ser: 1.46 mg/dL — ABNORMAL HIGH (ref 0.61–1.24)
GFR, Estimated: 46 mL/min — ABNORMAL LOW (ref >60)
Glucose, Bld: 97 mg/dL (ref 70–99)
Potassium: 3.8 mmol/L (ref 3.5–5.1)
Sodium: 135 mmol/L (ref 135–145)

## 2022-08-08 MED ORDER — IOHEXOL 350 MG/ML SOLN
75.0000 mL | Freq: Once | INTRAVENOUS | Status: AC | PRN
  Administered 2022-08-08: 75 mL via INTRAVENOUS

## 2022-08-08 NOTE — Consult Note (Signed)
Hospital Consult    Reason for Consult:  symptomatic L ICA stenosis Referring Physician:  Dr. [name] MRN #:  [id number]  History of Present Illness: This is a 89 y.o. male previous vascular disease presenting with right upper extremity symptoms found to have left-sided MCA stroke carotid stenosis.  On discussion patient has no complaints.    Past Medical History:  Diagnosis Date   CKD (chronic kidney disease) stage 3, GFR 30-59 ml/min (HCC)    Gait abnormality    Hypertension    Pancreatitis    Seizures (HCC)    Venous insufficiency     Past Surgical History:  Procedure Laterality Date   HERNIA REPAIR      No Known Allergies  Prior to Admission medications   Medication Sig Start Date End Date Taking? Authorizing Provider  amLODipine (NORVASC) 5 MG tablet Take 5 mg by mouth daily. 07/29/22  Yes [provider]  aspirin EC 81 MG tablet Take 81 mg by mouth daily. Swallow whole.   Yes [provider]  levETIRAcetam (KEPPRA) 500 MG tablet Take 1 tablet (500 mg total) by mouth 2 (two) times daily. 05/04/13  Yes Buriev, Isaiah Serge, MD  metoprolol tartrate (LOPRESSOR) 50 MG tablet Take 50 mg by mouth 2 (two) times daily. 10/11/16  Yes [provider]  multivitamin (ONE-A-DAY MEN'S) TABS tablet Take 1 tablet by mouth daily.   Yes [provider]  pantoprazole (PROTONIX) 40 MG tablet Take 1 tablet (40 mg total) by mouth daily. 06/16/22  Yes Doree Albee, PA-C    Social History   Socioeconomic History   Marital status: Divorced    Spouse name: Not on file   Number of children: 2   Years of education: Not on file   Highest education level: Not on file  Occupational History   Occupation: retired  Tobacco Use   Smoking status: Some Days    Types: Cigars   Smokeless tobacco: Never  Vaping Use   Vaping Use: Never used  Substance and Sexual Activity   Alcohol use: No   Drug use: No   Sexual activity: Never  Other Topics Concern   Not on  file  Social History Narrative   Not on file   Social Determinants of Health   Financial Resource Strain: Not on file  Food Insecurity: No Food Insecurity (08/06/2022)   Hunger Vital Sign    Worried About Running Out of Food in the Last Year: Never true    Ran Out of Food in the Last Year: Never true  Transportation Needs: No Transportation Needs (08/06/2022)   PRAPARE - Administrator, Civil Service (Medical): No    Lack of Transportation (Non-Medical): No  Physical Activity: Not on file  Stress: Not on file  Social Connections: Not on file  Intimate Partner Violence: Not At Risk (08/06/2022)   Humiliation, Afraid, Rape, and Kick questionnaire    Fear of Current or Ex-Partner: No    Emotionally Abused: No    Physically Abused: No    Sexually Abused: No     Family History  Problem Relation Age of Onset   Hypertension Other    Diabetes Other    Asthma Other     Review of Systems  Constitutional: Negative.   HENT: Negative.    Eyes: Negative.   Respiratory: Negative.    Cardiovascular: Negative.   Gastrointestinal: Negative.   Musculoskeletal: Negative.   Skin: Negative.   Neurological:  Positive for focal weakness. Negative  for speech change.  Endo/Heme/Allergies: Negative.   Psychiatric/Behavioral: Negative.        Physical Examination  Vitals:   08/08/22 0405 08/08/22 0716  BP: (!) 151/81 (!) 156/94  Pulse: 65 71  Resp: 14 14  Temp: 97.7 F (36.5 C) 98.1 F (36.7 C)  SpO2: 99% 99%   Body mass index is 19.56 kg/m.  Physical Exam HENT:     Head: Normocephalic.     Nose: Nose normal.  Eyes:     Pupils: Pupils are equal, round, and reactive to light.  Cardiovascular:     Rate and Rhythm: Regular rhythm.     Pulses:          Radial pulses are 2+ on the right side and 2+ on the left side.  Pulmonary:     Effort: Pulmonary effort is normal.  Abdominal:     General: Abdomen is flat.     Palpations: Abdomen is soft.  Musculoskeletal:         General: Normal range of motion.     Cervical back: Normal range of motion and neck supple.     Right lower leg: No edema.     Left lower leg: No edema.  Neurological:     Mental Status: He is alert.     Comments: Mild right sided weakness RUE and RLE relative to Left  Psychiatric:        Mood and Affect: Mood normal.      CBC    Component Value Date/Time   WBC 7.1 08/08/2022 0321   RBC 3.29 (L) 08/08/2022 0321   HGB 9.5 (L) 08/08/2022 0321   HCT 29.0 (L) 08/08/2022 0321   PLT 183 08/08/2022 0321   MCV 88.1 08/08/2022 0321   MCH 28.9 08/08/2022 0321   MCHC 32.8 08/08/2022 0321   RDW 12.3 08/08/2022 0321   LYMPHSABS 2.3 08/05/2022 1428   MONOABS 0.8 08/05/2022 1428   EOSABS 0.0 08/05/2022 1428   BASOSABS 0.1 08/05/2022 1428    BMET    Component Value Date/Time   NA 135 08/08/2022 0321   K 3.8 08/08/2022 0321   CL 106 08/08/2022 0321   CO2 22 08/08/2022 0321   GLUCOSE 97 08/08/2022 0321   BUN 21 08/08/2022 0321   CREATININE 1.46 (H) 08/08/2022 0321   CALCIUM 8.8 (L) 08/08/2022 0321   GFRNONAA 46 (L) 08/08/2022 0321   GFRAA 51 (L) 11/19/2019 2118    COAGS: Lab Results  Component Value Date   INR 1.3 (H) 08/05/2022   INR 1.2 04/25/2021   INR 1.34 12/08/2011     Non-Invasive Vascular Imaging:   MRI IMPRESSION: 1. Multiple small acute infarcts involving the left basal ganglia, left frontal, parietal and occipital cortex and adjacent white matter. Slight edema without mass effect. 2. Advanced brain atrophy (ICD10-G31.9). and chronic microvascular ischemic disease.  Right Carotid Findings:  +----------+--------+--------+--------+---------------------------+--------  +           PSV cm/sEDV cm/sStenosisPlaque Description          Comments  +----------+--------+--------+--------+---------------------------+--------  +  CCA Prox  57      15                                                     +----------+--------+--------+--------+---------------------------+--------  +  CCA Distal49      13                                                    +----------+--------+--------+--------+---------------------------+--------  +  ICA Prox  226     74      60-79%  hypoechoic and heterogenous           +----------+--------+--------+--------+---------------------------+--------  +  ICA Mid   166     49                                          tortuous  +----------+--------+--------+--------+---------------------------+--------  +  ICA Distal64      20                                                    +----------+--------+--------+--------+---------------------------+--------  +  ECA      37                                                            +----------+--------+--------+--------+---------------------------+--------  +   +----------+--------+-------+---------+-------------------+           PSV cm/sEDV cmsDescribe Arm Pressure (mmHG)  +----------+--------+-------+---------+-------------------+  [id number]            Turbulent                     +----------+--------+-------+---------+-------------------+   +---------+--------+--+--------+--+---------+  VertebralPSV cm/s48EDV cm/s15Antegrade  +---------+--------+--+--------+--+---------+      Left Carotid Findings:  +---------+--------+-------+--------+------------------------+-------------  ----+          PSV cm/sEDV    StenosisPlaque Description      Comments                             cm/s                                                       +---------+--------+-------+--------+------------------------+-------------  ----+  CCA Prox 71      9                                      intimal                                                                     thickening           +---------+--------+-------+--------+------------------------+-------------  ----+  CCA     41      7  Distal                                                                      +---------+--------+-------+--------+------------------------+-------------  ----+  ICA Prox 255     75     60-79%  heterogenous and                                                            irregular                                   +---------+--------+-------+--------+------------------------+-------------  ----+  ICA Mid  107     22                                     tortuous            +---------+--------+-------+--------+------------------------+-------------  ----+  ICA     80      20                                                         Distal                                                                      +---------+--------+-------+--------+------------------------+-------------  ----+  ECA     37                                                                 +---------+--------+-------+--------+------------------------+-------------  ----+   +----------+--------+--------+---------+-------------------+           PSV cm/sEDV cm/sDescribe Arm Pressure (mmHG)  +----------+--------+--------+---------+-------------------+  Subclavian221            Turbulent                     +----------+--------+--------+---------+-------------------+   +---------+--------+--+--------+--+---------+  VertebralPSV cm/s44EDV cm/s12Antegrade     Summary:  Right Carotid: Velocities in the right ICA are consistent with a 60-79% stenosis.   Left Carotid: Velocities in the left ICA are consistent with a 60-79%  stenosis.   Vertebrals: Bilateral vertebral arteries demonstrate antegrade flow.  Subclavians: Bilateral subclavian artery flow was disturbed.      ASSESSMENT/PLAN: This is a 89 y.o. male with symptomatic left ICA stenosis.  I discussed with  the patient and his family and we will plan for CT scan to further evaluate and discuss surgical planning after this.  Kashina Mecum C. Donzetta Matters, MD [facility] Office: [phone] Pager: [phone]

## 2022-08-08 NOTE — Progress Notes (Signed)
Mobility Specialist Progress Note   08/08/22 1515  Mobility  Activity Stood at bedside;Ambulated with assistance in room;Dangled on edge of bed (LE exercise. March in place.)  Level of Assistance Moderate assist, patient does 50-74%  Information systems manager Ambulated (ft) 2 ft  Range of Motion/Exercises Active;All extremities  Activity Response Tolerated well   Patient received in supine. Presented A&O x1 but agreeable to participate. Required mod A for bed mobility + tactile cues to use bed rail. Completed LE exercises while dangling EOB . Progressed to standing, completing STSx2 total with mod A and multimodal cues. Upon standing patient with posterior lean and narrow BOS requiring min A to balance. Was able to march in place, weight shift, stand on one leg for extended time, and take minimal steps alongside bed with min-mod A for balance. Returned to bed without complaint or incident. Was left in supine with all needs met, call bell in reach.   [place] Tran Randle, BS EXP Mobility Specialist Please contact via SecureChat or Rehab office at [phone]

## 2022-08-08 NOTE — Progress Notes (Addendum)
STROKE TEAM PROGRESS NOTE   INTERVAL HISTORY No family at the bedside.   Doing well.  No new weakness. Eating breakfast on his own.  Vitals:   08/07/22 2358 08/08/22 0405 08/08/22 0716 08/08/22 1104  BP: (!) 149/85 (!) 151/81 (!) 156/94 132/73  Pulse: 82 65 71 74  Resp: 14 14 14 15   Temp: 98.2 F (36.8 C) 97.7 F (36.5 C) 98.1 F (36.7 C) 98.3 F (36.8 C)  TempSrc: Oral Oral Oral Oral  SpO2: 98% 99% 99% 98%  Weight:      Height:       CBC:  Recent Labs  Lab 08/05/22 1428 08/05/22 1434 08/07/22 0348 08/08/22 0321  WBC 7.1  --  5.3 7.1  NEUTROABS 4.0  --   --   --   HGB 9.5*   < > 9.9* 9.5*  HCT 30.0*   < > 30.0* 29.0*  MCV 89.3  --  87.2 88.1  PLT 187  --  173 183   < > = values in this interval not displayed.    Basic Metabolic Panel:  Recent Labs  Lab 08/07/22 0348 08/08/22 0321  NA 136 135  K 4.0 3.8  CL 106 106  CO2 22 22  GLUCOSE 97 97  BUN 20 21  CREATININE 1.52* 1.46*  CALCIUM 9.1 8.8*    Lipid Panel:  Recent Labs  Lab 08/06/22 0523  CHOL 143  TRIG 27  HDL 69  CHOLHDL 2.1  VLDL 5  LDLCALC 69    HgbA1c:  Recent Labs  Lab 08/05/22 1428  HGBA1C 5.3    Urine Drug Screen:  Recent Labs  Lab 08/05/22 1426  LABOPIA NONE DETECTED  COCAINSCRNUR NONE DETECTED  LABBENZ NONE DETECTED  AMPHETMU NONE DETECTED  THCU NONE DETECTED  LABBARB NONE DETECTED     Alcohol Level  Recent Labs  Lab 08/05/22 1428  ETH <10     IMAGING past 24 hours No results found.  PHYSICAL EXAM General: sitting up eating breakfast in bed. Pulm: unlabored breathing, no supplemental oxygen needed.  Cardiac:RRR Aox3. No aphasia present, follows commands, able to name and repeat.  Dysarthria present but mild. RUE: 4/5  LUE: 5/5. RLE: 3/5  LLE: 5-/5. Sensation intact and symmetrical FTN intact, slow on right.   ASSESSMENT/PLAN Mr. Carlos Shields is a 89 y.o. male with history of history of seizure on AEDs, CVA, hypertension, CKD stage IIIa, dysphagia who  presented with right-sided weakness.  Patient's daughter reported that normally ambulates with a walker.  On the day of admission, around 1 to 2 PM, he wet his bed which was unusual for him, she saw him falling out of the bed and seemed to be dragging his leg.  Right hand appeared twisted so the daughter thought that he was having a seizure and called EMS.  He had missed few doses of his Keppra due to issues with pharmacy and ran out of aspirin for a week.  Patient's son also had died the previous week.  LKW: 1300/1400 08/05/2022 TNK: no, outside of window IR Thrombectomy? No,  Modified Rankin Scale: 4-Needs assistance to walk and tend to bodily needs  Acute Ischemic Strokes, multiple locations Etiology:  missed Aspirin doses  vs Embolic (no history of Afib) Code Stroke CT head:  No acute abnormality.   ASPECTS 10.    MRI Multiple small acute infarcts involving the left basal ganglia, left frontal, parietal and occipital cortex and adjacent white matter. Slight edema without mass effect. Advanced  brain atrophy and chronic microvascular ischemic disease MRA Negative intracranial MRA for large vessel occlusion. Intracranial atherosclerotic disease with associated moderate stenosis at the left carotid siphon, with additional severe distal left P3 stenosis. No other proximal high-grade or correctable stenosis.  Vascular consulted, ordered CTA as Cr is improving and will see today.  2D Echo preserved EF, no evidence of IA shunt   LDL 69 HgbA1c 5.3 VTE prophylaxis - lovenox    Diet   Diet Heart Room service appropriate? Yes; Fluid consistency: Thin   aspirin 81 mg daily prior to admission, now on aspirin 81 mg daily. Aspirin and plavix x 3 weeks then plavix alone. Therapy recommendations:  AIR Disposition:  pending  Hypertension Home meds:  norvasc Permissive hypertension (OK if < 220/120) but gradually normalize in 5-7 days Long-term BP goal normotensive  Hyperlipidemia Home meds:   none LDL 69, goal < 70 High intensity statin not indicated    Other Stroke Risk Factors Advanced Age >/= 67   Other Active Problems, per Primary Team Seizures Continue Keppra 500mg  BID Seizure Precautions Acute on Chronic CKD, Stage 3a Chronic Normocytic Anemia Abnormal TSH  Hospital day # 30   89 year old with left basal ganglia frontal and parietal strokes on MRI.  CTA shows 60 to 79% stenosis bilaterally.  Aspirin 81mg  and plavix x 3 weeks then plavix alone. CTA for today ordered by vascular. Exam as above.  Continue seizure medications of Keppra 500 mg twice daily.  Neurology will sign off, please call with questions. He can f/u with Stroke clinic outpt.   Trinika Cortese,MD    To contact Stroke Continuity provider, please refer to [email or web address] After hours, contact General Neurology

## 2022-08-08 NOTE — Progress Notes (Signed)
[facility]                                                                              Carlos Shields, is a 89 y.o. male, DOB - Jul 21, 1930, SWH:[id number] Admit date - 08/05/2022    Outpatient Primary MD for the patient is [name], PA  LOS - 2  days  Chief Complaint  Patient presents with   Code Stroke   Seizures       Brief summary   Patient is a 89 year old male with history of seizure on antiepileptics, CVA, hypertension, CKD stage IIIa, dysphagia who presented with right-sided weakness.  Patient's daughter reported that normally ambulates with a walker.  On the day of admission, around 1 to 2 PM, he wet his bed which was unusual for him, she saw him falling out of the bed and seemed to be dragging his leg.  Right hand appeared twisted so the daughter thought that he was having a seizure and called EMS.  He had missed few doses of his Keppra due to issues with pharmacy and ran out of aspirin for a week.  Patient's son also had died the previous week.  CT head was negative.  No tPA or thrombectomy as he was outside window.  Patient was evaluated over telemetry by Dr. Leonel Ramsay, recommended MRI brain.  Patient received additional Keppra MRI brain showed multiple small acute infarcts involving the left basal ganglia, left frontal, parietal and occipital cortex and adjacent white matter.  Slight edema without mass effect. Admitted for further workup.  Assessment & Plan     Acute CVA (cerebral vascular accident) ([place]) with prior history of CVA -MRI brain showed multiple small acute infarct involving the left basal ganglia, left frontal, right occipital cortex and adjacent white matter.  Slight edema without mass effect.  Distribution of stroke appears to be embolic but no prior history of A-fib. MRA Negative intracranial MRA for large vessel occlusion.significant for Intracranial atherosclerotic disease with associated moderate stenosis at the left carotid  siphon, with additional severe distal left P3 stenosis. No other proximal high-grade or correctable stenosis. -Permissive hypertension, holding amlodipine, metoprolol -Lipid panel showed cholesterol 143, LDL 69. -PT/OT consulted, recommendation for CIR -He is on aspirin, continued, decision regarding Plavix pending vascular evaluation  Carotid artery stenosis -Vascular surgery consulted, CTA is pending  Seizures ([place]) -Due to concern for possible breakthrough seizure, patient received additional 1 g IV Keppra in ED -Continue Keppra 500 mg twice daily -Seizure precautions  Acute on chronic CKD, stage IIIa -Baseline around 1.2, peaked at 2.1, continues to improve -Renal ultrasound significant for chronic medical disease, no hydronephrosis  HTN (hypertension) -Continue permissive hypertension in the setting of acute CVA  Chronic normocytic anemia -Hemoglobin baseline 9-10 -Currently at baseline   Abnormal TSH -TSH 4.9, obtain free T4     Acute metabolic encephalopathy -Likely due to #1  Estimated body mass index is 19.56 kg/m as calculated from the following:   Height as of this encounter: 5\' 4"  (1.626 m).   Weight as of this encounter: 51.7 kg.  Code Status: Full CODE STATUS DVT Prophylaxis:  enoxaparin (LOVENOX) injection  30 mg Start: 08/05/22 2200   Level of Care: Level of care: Telemetry Medical Family Communication: None at bedside Disposition Plan:      Remains inpatient appropriate: Stroke workup in progress, awaiting transfer to [facility]   Procedures:  MRI brain, MRA  Consultants:   Neurology Vascular surgery  Antimicrobials: None   Medications  aspirin EC  81 mg Oral Daily   enoxaparin (LOVENOX) injection  30 mg Subcutaneous Q24H   levETIRAcetam  500 mg Oral BID   pantoprazole  40 mg Oral Daily      Subjective:   [name] significant events overnight as discussed with staff, he denies any complaints today   Objective:   Vitals:   08/07/22 2358  08/08/22 0405 08/08/22 0716 08/08/22 1104  BP: (!) 149/85 (!) 151/81 (!) 156/94 132/73  Pulse: 82 65 71 74  Resp: 14 14 14 15   Temp: 98.2 F (36.8 C) 97.7 F (36.5 C) 98.1 F (36.7 C) 98.3 F (36.8 C)  TempSrc: Oral Oral Oral Oral  SpO2: 98% 99% 99% 98%  Weight:      Height:        Intake/Output Summary (Last 24 hours) at 08/08/2022 1113 Last data filed at 08/08/2022 0400 Gross per 24 hour  Intake 1240 ml  Output 500 ml  Net 740 ml     Wt Readings from Last 3 Encounters:  08/05/22 51.7 kg  06/16/22 51.7 kg  04/25/21 54.4 kg     Exam  Awake Alert, confused, pleasant, no apparent distress Symmetrical Chest wall movement, Good air movement bilaterally, CTAB RRR,No Gallops,Rubs or new Murmurs, No Parasternal Heave +ve B.Sounds, Abd Soft, No tenderness, No rebound - guarding or rigidity. No Cyanosis, Clubbing or edema, No new Rash or bruise     Data Reviewed:  I have personally reviewed following labs    CBC Lab Results  Component Value Date   WBC 7.1 08/08/2022   RBC 3.29 (L) 08/08/2022   HGB 9.5 (L) 08/08/2022   HCT 29.0 (L) 08/08/2022   MCV 88.1 08/08/2022   MCH 28.9 08/08/2022   PLT 183 08/08/2022   MCHC 32.8 08/08/2022   RDW 12.3 08/08/2022   LYMPHSABS 2.3 08/05/2022   MONOABS 0.8 08/05/2022   EOSABS 0.0 08/05/2022   BASOSABS 0.1 08/05/2022     Last metabolic panel Lab Results  Component Value Date   NA 135 08/08/2022   K 3.8 08/08/2022   CL 106 08/08/2022   CO2 22 08/08/2022   BUN 21 08/08/2022   CREATININE 1.46 (H) 08/08/2022   GLUCOSE 97 08/08/2022   GFRNONAA 46 (L) 08/08/2022   GFRAA 51 (L) 11/19/2019   CALCIUM 8.8 (L) 08/08/2022   PHOS 2.9 05/04/2013   PROT 7.8 08/05/2022   ALBUMIN 4.3 08/05/2022   BILITOT 1.0 08/05/2022   ALKPHOS 66 08/05/2022   AST 29 08/05/2022   ALT 13 08/05/2022   ANIONGAP 7 08/08/2022    CBG (last 3)  Recent Labs    08/05/22 1422  GLUCAP 92      Coagulation Profile: Recent Labs  Lab 08/05/22 1428   INR 1.3*     Radiology Studies: I have personally reviewed the imaging studies  ECHOCARDIOGRAM COMPLETE  Result Date: 08/06/2022    ECHOCARDIOGRAM REPORT   Patient Name:   Carlos Shields  Date of Exam: 08/06/2022 Medical Rec #:  08/08/2022  Height:       64.0 in Accession #:    [id number] Weight:       114.0  lb Date of Birth:  Mar 04, 1931  BSA:          1.541 m Patient Age:    79 years   BP:           129/72 mmHg Patient Gender: M          HR:           69 bpm. Exam Location:  Inpatient Procedure: 2D Echo, Cardiac Doppler and Color Doppler Indications:    Stroke I63.9  History:        Patient has no prior history of Echocardiogram examinations.                 Stroke; Signs/Symptoms:Syncope. CKD, stage III.  Sonographer:    Lucendia Herrlich Referring Phys: [id number] CHING T TU IMPRESSIONS  1. Left ventricular ejection fraction, by estimation, is 60 to 65%. The left ventricle has normal function. The left ventricle has no regional wall motion abnormalities. Left ventricular diastolic parameters are consistent with Grade I diastolic dysfunction (impaired relaxation).  2. Right ventricular systolic function is normal. The right ventricular size is normal.  3. The mitral valve is abnormal. Trivial mitral valve regurgitation. No evidence of mitral stenosis.  4. The aortic valve is tricuspid. There is mild calcification of the aortic valve. There is mild thickening of the aortic valve. Aortic valve regurgitation is not visualized. Aortic valve sclerosis is present, with no evidence of aortic valve stenosis.  5. The inferior vena cava is normal in size with greater than 50% respiratory variability, suggesting right atrial pressure of 3 mmHg. FINDINGS  Left Ventricle: Left ventricular ejection fraction, by estimation, is 60 to 65%. The left ventricle has normal function. The left ventricle has no regional wall motion abnormalities. The left ventricular internal cavity size was normal in size. There is  no left ventricular  hypertrophy. Left ventricular diastolic parameters are consistent with Grade I diastolic dysfunction (impaired relaxation). Right Ventricle: The right ventricular size is normal. No increase in right ventricular wall thickness. Right ventricular systolic function is normal. Left Atrium: Left atrial size was normal in size. Right Atrium: Right atrial size was normal in size. Pericardium: There is no evidence of pericardial effusion. Mitral Valve: The mitral valve is abnormal. There is mild thickening of the mitral valve leaflet(s). There is mild calcification of the mitral valve leaflet(s). Trivial mitral valve regurgitation. No evidence of mitral valve stenosis. Tricuspid Valve: The tricuspid valve is normal in structure. Tricuspid valve regurgitation is not demonstrated. No evidence of tricuspid stenosis. Aortic Valve: The aortic valve is tricuspid. There is mild calcification of the aortic valve. There is mild thickening of the aortic valve. Aortic valve regurgitation is not visualized. Aortic valve sclerosis is present, with no evidence of aortic valve stenosis. Aortic valve mean gradient measures 3.0 mmHg. Aortic valve peak gradient measures 6.7 mmHg. Aortic valve area, by VTI measures 2.30 cm. Pulmonic Valve: The pulmonic valve was normal in structure. Pulmonic valve regurgitation is not visualized. No evidence of pulmonic stenosis. Aorta: The aortic root is normal in size and structure. Venous: The inferior vena cava is normal in size with greater than 50% respiratory variability, suggesting right atrial pressure of 3 mmHg. IAS/Shunts: No atrial level shunt detected by color flow Doppler.  LEFT VENTRICLE PLAX 2D LVOT diam:     2.20 cm   Diastology LV SV:         59        LV e' medial:    6.42 cm/s LV  SV Index:   38        LV E/e' medial:  9.8 LVOT Area:     3.80 cm  LV e' lateral:   7.40 cm/s                          LV E/e' lateral: 8.5  RIGHT VENTRICLE            IVC RV S prime:     7.46 cm/s  IVC diam:  1.10 cm TAPSE (M-mode): 1.5 cm LEFT ATRIUM             Index        RIGHT ATRIUM          Index LA Vol (A2C):   24.0 ml 15.58 ml/m  RA Area:     7.28 cm LA Vol (A4C):   16.9 ml 10.97 ml/m  RA Volume:   11.40 ml 7.40 ml/m LA Biplane Vol: 20.9 ml 13.57 ml/m  AORTIC VALVE AV Area (Vmax):    2.36 cm AV Area (Vmean):   2.36 cm AV Area (VTI):     2.30 cm AV Vmax:           129.00 cm/s AV Vmean:          86.700 cm/s AV VTI:            0.258 m AV Peak Grad:      6.7 mmHg AV Mean Grad:      3.0 mmHg LVOT Vmax:         79.95 cm/s LVOT Vmean:        53.800 cm/s LVOT VTI:          0.156 m LVOT/AV VTI ratio: 0.60  AORTA Ao Root diam: 3.30 cm Ao Asc diam:  3.10 cm MITRAL VALVE MV Area (PHT): 1.98 cm    SHUNTS MV Decel Time: 384 msec    Systemic VTI:  0.16 m MV E velocity: 62.60 cm/s  Systemic Diam: 2.20 cm MV A velocity: 89.10 cm/s MV E/A ratio:  0.70 Charlton Haws MD Electronically signed by Charlton Haws MD Signature Date/Time: 08/06/2022/12:37:27 PM    Final    US RENAL  Result Date: 08/06/2022 CLINICAL DATA:  Acute kidney injury EXAM: RENAL / URINARY TRACT ULTRASOUND COMPLETE COMPARISON:  CT abdomen 07/02/2022 FINDINGS: Right Kidney: Renal measurements: 10.4 by 6.0 by 4.6 cm = volume: 150 mL. Echogenic parenchyma compatible with chronic medical renal disease. No solid mass or hydronephrosis visualized. Small right kidney lower pole cyst seen on image 13 series 1. Left Kidney: Renal measurements: 10.2 by 5.3 by 4.7 cm = volume: 134 mL. Echogenic parenchyma compatible with chronic medical renal disease. No mass or hydronephrosis visualized. The 4 mm left kidney upper pole calculus shown on the CT from 07/02/2022 is well seen today. Bladder: Appears normal for degree of bladder distention. Other: None. IMPRESSION: 1. Bilateral echogenic renal parenchyma compatible with chronic medical renal disease. 2. No hydronephrosis. 3. The 4 mm left renal calculus shown on recent CT is not readily apparent on today's ultrasound  exam. It may have passed or simply be occult sonographically. The technologist notes suboptimal images due to patient breathing and overlying bowel gas. Electronically Signed   By: Gaylyn Rong M.D.   On: 08/06/2022 12:22       Huey Bienenstock M.D. [facility] 08/08/2022, 11:13 AM  Available via Epic secure chat 7am-7pm After 7 pm, please refer to night coverage provider  listed on amion.

## 2022-08-09 DIAGNOSIS — G9341 Metabolic encephalopathy: Secondary | ICD-10-CM | POA: Diagnosis not present

## 2022-08-09 DIAGNOSIS — R569 Unspecified convulsions: Secondary | ICD-10-CM | POA: Diagnosis not present

## 2022-08-09 DIAGNOSIS — I639 Cerebral infarction, unspecified: Secondary | ICD-10-CM | POA: Diagnosis not present

## 2022-08-09 LAB — BASIC METABOLIC PANEL
Anion gap: 8 (ref 5–15)
BUN: 17 mg/dL (ref 8–23)
CO2: 21 mmol/L — ABNORMAL LOW (ref 22–32)
Calcium: 8.4 mg/dL — ABNORMAL LOW (ref 8.9–10.3)
Chloride: 106 mmol/L (ref 98–111)
Creatinine, Ser: 1.34 mg/dL — ABNORMAL HIGH (ref 0.61–1.24)
GFR, Estimated: 51 mL/min — ABNORMAL LOW (ref >60)
Glucose, Bld: 83 mg/dL (ref 70–99)
Potassium: 3.7 mmol/L (ref 3.5–5.1)
Sodium: 135 mmol/L (ref 135–145)

## 2022-08-09 LAB — CBC
HCT: 26.8 % — ABNORMAL LOW (ref 39.0–52.0)
Hemoglobin: 9 g/dL — ABNORMAL LOW (ref 13.0–17.0)
MCH: 29.3 pg (ref 26.0–34.0)
MCHC: 33.6 g/dL (ref 30.0–36.0)
MCV: 87.3 fL (ref 80.0–100.0)
Platelets: 179 10*3/uL (ref 150–400)
RBC: 3.07 MIL/uL — ABNORMAL LOW (ref 4.22–5.81)
RDW: 12.4 % (ref 11.5–15.5)
WBC: 5.6 10*3/uL (ref 4.0–10.5)
nRBC: 0 % (ref 0.0–0.2)

## 2022-08-09 MED ORDER — METOPROLOL TARTRATE 12.5 MG HALF TABLET
12.5000 mg | ORAL_TABLET | Freq: Two times a day (BID) | ORAL | Status: DC
  Administered 2022-08-09 – 2022-08-14 (×10): 12.5 mg via ORAL
  Filled 2022-08-09 (×10): qty 1

## 2022-08-09 MED ORDER — ROSUVASTATIN CALCIUM 5 MG PO TABS
10.0000 mg | ORAL_TABLET | Freq: Every day | ORAL | Status: DC
  Administered 2022-08-09 – 2022-08-14 (×5): 10 mg via ORAL
  Filled 2022-08-09 (×5): qty 2

## 2022-08-09 MED ORDER — CLOPIDOGREL BISULFATE 75 MG PO TABS
75.0000 mg | ORAL_TABLET | Freq: Every day | ORAL | Status: DC
  Administered 2022-08-09 – 2022-08-14 (×5): 75 mg via ORAL
  Filled 2022-08-09 (×5): qty 1

## 2022-08-09 NOTE — Progress Notes (Signed)
Occupational Therapy Treatment Patient Details Name: Carlos Shields MRN: [id number] DOB: [birth year] Today's Date: 08/09/2022   History of present illness Pt is 89 yo male admitted 08/05/22 with R sided weakness found to have small acute infarcts  involving the left basal ganglia, left frontal, parietal and occipital cortex and adjacent white matter.  Pt with hx of seizure on antiepileptics, CVA, hypertension, CKD stage IIIa, dysphagia   OT comments  Patient continues to make steady progress towards goals in skilled OT session. Patient's session encompassed RUE strengthening with theraband and increasing RUE overall strength. Bed mobility and OOB functional mobility deferred as patient had recently been up in bathroom and walking with mobility specialist. Agreeable to theraband exercises with patient with good demonstration. Goal established to promote carry over, OT will continue to follow with AIR placement remaining appropriate.    Recommendations for follow up therapy are one component of a multi-disciplinary discharge planning process, led by the attending physician.  Recommendations may be updated based on patient status, additional functional criteria and insurance authorization.    Follow Up Recommendations  Acute inpatient rehab (3hours/day)     Assistance Recommended at Discharge Frequent or constant Supervision/Assistance  Patient can return home with the following  A little help with walking and/or transfers;A lot of help with bathing/dressing/bathroom;Assistance with cooking/housework;Direct supervision/assist for medications management;Assist for transportation;Help with stairs or ramp for entrance;Direct supervision/assist for financial management   Equipment Recommendations  Other (comment) (Defer to next venue)    Recommendations for Other Services      Precautions / Restrictions Precautions Precautions: Fall;Other (comment) Precaution Comments: incontinent Restrictions Weight  Bearing Restrictions: No       Mobility Bed Mobility               General bed mobility comments: deferred as patient had recently been up in bathroom and walking with mobility specialist and will ambulate with PT later in the PM    Transfers                   General transfer comment: deferred as patient had recently been up in bathroom and walking with mobility specialist and will ambulate with PT later in the PM     Balance Overall balance assessment: Needs assistance Sitting-balance support: No upper extremity supported, Feet supported Sitting balance-Leahy Scale: Fair Sitting balance - Comments: posterior lean initially needing Min A progessing to Min guard   Standing balance support: During functional activity, Single extremity supported Standing balance-Leahy Scale: Poor Standing balance comment: Requiring RW or UE support for balance                           ADL either performed or assessed with clinical judgement   ADL Overall ADL's : Needs assistance/impaired Eating/Feeding: Set up;Sitting                                   Functional mobility during ADLs: Minimal assistance;Rolling walker (2 wheels) General ADL Comments: Session focus on theraband exercises and increasing strength and dexterity with RUE    Extremity/Trunk Assessment Upper Extremity Assessment Upper Extremity Assessment: RUE deficits/detail RUE Deficits / Details: decreased AROM in RUE, theraband and squeeze ball provided to promote increased strength in RUE RUE Coordination: decreased fine motor;decreased gross motor            Vision   Vision Assessment?: No apparent visual  deficits   Perception     Praxis      Cognition Arousal/Alertness: Awake/alert Behavior During Therapy: WFL for tasks assessed/performed Overall Cognitive Status: No family/caregiver present to determine baseline cognitive functioning Area of Impairment: Problem solving                              Problem Solving: Slow processing, Requires verbal cues, Requires tactile cues General Comments: Slower processing, but very participatory and otherwise cognitively appropriate        Exercises Exercises: General Upper Extremity General Exercises - Upper Extremity Shoulder Flexion: AROM, 20 reps, Theraband, Both Theraband Level (Shoulder Flexion): Level 2 (Red) Shoulder Extension: AROM, 20 reps, Both, Theraband Theraband Level (Shoulder Extension): Level 2 (Red) Elbow Flexion: AROM, Theraband, 20 reps, Right Theraband Level (Elbow Flexion): Level 2 (Red) Elbow Extension: AROM, Theraband, 20 reps, Right Theraband Level (Elbow Extension): Level 2 (Red)    Shoulder Instructions       General Comments      Pertinent Vitals/ Pain       Pain Assessment Pain Assessment: No/denies pain  Home Living                                          Prior Functioning/Environment              Frequency  Min 2X/week        Progress Toward Goals  OT Goals(current goals can now be found in the care plan section)  Progress towards OT goals: Progressing toward goals  Acute Rehab OT Goals Patient Stated Goal: to get better OT Goal Formulation: With patient Time For Goal Achievement: 08/20/22 Potential to Achieve Goals: Good  Plan Discharge plan remains appropriate    Co-evaluation                 AM-PAC OT "6 Clicks" Daily Activity     Outcome Measure   Help from another person eating meals?: A Little Help from another person taking care of personal grooming?: A Little Help from another person toileting, which includes using toliet, bedpan, or urinal?: A Lot Help from another person bathing (including washing, rinsing, drying)?: A Lot Help from another person to put on and taking off regular upper body clothing?: A Little Help from another person to put on and taking off regular lower body clothing?: A Lot 6  Click Score: 15    End of Session Equipment Utilized During Treatment: Other (comment) (Red theraband, squeeze ball)  OT Visit Diagnosis: Hemiplegia and hemiparesis Hemiplegia - Right/Left: Right Hemiplegia - dominant/non-dominant: Dominant Hemiplegia - caused by: Cerebral infarction   Activity Tolerance Patient tolerated treatment well   Patient Left in bed;with call bell/phone within reach;with bed alarm set   Nurse Communication Mobility status        Time: 7782-4235 OT Time Calculation (min): 23 min  Charges: OT General Charges $OT Visit: 1 Visit OT Treatments $Therapeutic Activity: 23-37 mins  Corinne Ports E. Anabel Lykins, OTR/L Acute Rehabilitation Services [phone]   Ascencion Dike 08/09/2022, 3:21 PM

## 2022-08-09 NOTE — Progress Notes (Signed)
Mobility Specialist: Progress Note   08/09/22 1233  Mobility  Activity Ambulated with assistance in hallway  Level of Assistance Moderate assist, patient does 50-74%  Assistive Device Front wheel walker  Distance Ambulated (ft) 110 ft  Activity Response Tolerated well  Mobility Referral Yes  $Mobility charge 1 Mobility   Pt received in the bed and agreeable to mobility. MinA with bed mobility and modA to stand secondary to posterior bias; progressed better with distance. Pt to BR per request and instructed to pull call string when finished. RN helped pt to sit EOB once finished in BR and agreeable to hallway ambulation. Moderate posterior lean upon standing with cues for upright posture. No c/o throughout. Pt back to bed after session with call bell and phone at his side. Bed alarm is on.   Dillon Artemisa Sladek Mobility Specialist Please contact via SecureChat or Rehab office at [phone]

## 2022-08-09 NOTE — Progress Notes (Signed)
Inpatient Rehab Admissions Coordinator:  Pt not medically ready for potential CIR admission. Pending vascular surgery this week. Will continue to follow.   Gayland Curry, [place], [place] Admissions Coordinator [phone]

## 2022-08-09 NOTE — Progress Notes (Addendum)
  Progress Note    08/09/2022 7:43 AM * No surgery found *  Subjective:  no complaints    Vitals:   08/08/22 2339 08/09/22 0325  BP: (!) 153/82 (!) 161/79  Pulse: 77 67  Resp: 16 16  Temp: 98.3 F (36.8 C) 98.3 F (36.8 C)  SpO2: 96% 99%    Physical Exam: Lungs:  nonlabored Extremities:  mild right sided weakness vs left. Palpable radial pulses bilaterally  CBC    Component Value Date/Time   WBC 5.6 08/09/2022 0341   RBC 3.07 (L) 08/09/2022 0341   HGB 9.0 (L) 08/09/2022 0341   HCT 26.8 (L) 08/09/2022 0341   PLT 179 08/09/2022 0341   MCV 87.3 08/09/2022 0341   MCH 29.3 08/09/2022 0341   MCHC 33.6 08/09/2022 0341   RDW 12.4 08/09/2022 0341   LYMPHSABS 2.3 08/05/2022 1428   MONOABS 0.8 08/05/2022 1428   EOSABS 0.0 08/05/2022 1428   BASOSABS 0.1 08/05/2022 1428    BMET    Component Value Date/Time   NA 135 08/09/2022 0341   K 3.7 08/09/2022 0341   CL 106 08/09/2022 0341   CO2 21 (L) 08/09/2022 0341   GLUCOSE 83 08/09/2022 0341   BUN 17 08/09/2022 0341   CREATININE 1.34 (H) 08/09/2022 0341   CALCIUM 8.4 (L) 08/09/2022 0341   GFRNONAA 51 (L) 08/09/2022 0341   GFRAA 51 (L) 11/19/2019 2118    INR    Component Value Date/Time   INR 1.3 (H) 08/05/2022 1428     Intake/Output Summary (Last 24 hours) at 08/09/2022 0743 Last data filed at 08/09/2022 0329 Gross per 24 hour  Intake --  Output 1800 ml  Net -1800 ml      Assessment/Plan:  89 y.o. male with symptomatic left ICA stenosis   -Continued mild right sided weakness -CTA demonstrates proximal left ICA stenosis about 75% and 65% on the right -Patient is agreeable to either left TCAR vs CEA pending discussion with Dr.Calandria Mullings  Vicente Serene, PA-C [facility] [phone] 08/09/2022 7:43 AM  I have independently interviewed and examined patient and agree with PA assessment and plan above. Plan for Left TCAR on Thursday this week to decrease risk of further stroke. He appears to be  improving with physical therapy. Continue aspirin and I have initiated plavix and crestor.   Stalin Gruenberg C. Donzetta Matters, MD [facility] [place] Office: [phone] Pager: [phone]

## 2022-08-09 NOTE — Care Management Important Message (Signed)
Important Message  Patient Details  Name: Carlos Shields MRN: [id number] Date of Birth: 1931-04-26   Medicare Important Message Given:  Yes     Hannah Beat 08/09/2022, 2:40 PM

## 2022-08-09 NOTE — Progress Notes (Signed)
  Transition of Care [facility]) Screening Note   Patient Details  Name: Carlos Shields Date of Birth: [birth year]   Transition of Care [facility]) CM/SW Contact:    Geralynn Ochs, LCSW Phone Number: 08/09/2022, 3:14 PM    Transition of Care Department [facility]) has reviewed patient, AIR following for possible admission pending completion of medical workup. We will continue to monitor patient advancement through interdisciplinary progression rounds. If new patient transition needs arise, please place a TOC consult.

## 2022-08-09 NOTE — Progress Notes (Signed)
Physical Therapy Treatment Patient Details Name: Carlos Shields MRN: [id number] DOB: 1931-04-15 Today's Date: 08/09/2022   History of Present Illness Pt is 89 yo male admitted 08/05/22 with R sided weakness found to have small acute infarcts  involving the left basal ganglia, left frontal, parietal and occipital cortex and adjacent white matter.  Pt with hx of seizure on antiepileptics, CVA, hypertension, CKD stage IIIa, dysphagia    PT Comments    Pt making steady progress towards his physical therapy goals; exhibiting improved activity tolerance and ambulation distance today. Pt requiring min-mod assist overall for functional mobility. Able to participate in seated functional reaching with BUE's to address dynamic balance and RUE ROM. Pt ambulating 75 ft with a walker; intermittent assist for steering, particularly with turns. Continue to recommend acute inpatient rehab (AIR) for post-acute therapy needs.    Recommendations for follow up therapy are one component of a multi-disciplinary discharge planning process, led by the attending physician.  Recommendations may be updated based on patient status, additional functional criteria and insurance authorization.  Follow Up Recommendations  Acute inpatient rehab (3hours/day)     Assistance Recommended at Discharge Frequent or constant Supervision/Assistance  Patient can return home with the following A lot of help with walking and/or transfers;A lot of help with bathing/dressing/bathroom   Equipment Recommendations  None recommended by PT    Recommendations for Other Services       Precautions / Restrictions Precautions Precautions: Fall;Other (comment) Precaution Comments: incontinent Restrictions Weight Bearing Restrictions: No     Mobility  Bed Mobility Overal bed mobility: Needs Assistance Bed Mobility: Supine to Sit, Sit to Supine     Supine to sit: Min assist Sit to supine: Mod assist   General bed mobility comments: Assist  with bed pad to pivot R hip to edge of bed and BLE's back into bed    Transfers Overall transfer level: Needs assistance Equipment used: Rolling walker (2 wheels) Transfers: Sit to/from Stand Sit to Stand: Mod assist           General transfer comment: modA to rise from edge of bed, increased posterior lean initially and increased trunk flexion    Ambulation/Gait Ambulation/Gait assistance: Min assist, Mod assist Gait Distance (Feet): 75 Feet Assistive device: Rolling walker (2 wheels) Gait Pattern/deviations: Decreased stride length, Narrow base of support, Trunk flexed, Leaning posteriorly, Decreased step length - right, Decreased step length - left, Shuffle       General Gait Details: Cues for wider BOS, upright posture, initially modA, progressing to minA for balance with assist for steering RW   Stairs             Wheelchair Mobility    Modified Rankin (Stroke Patients Only) Modified Rankin (Stroke Patients Only) Pre-Morbid Rankin Score: No significant disability Modified Rankin: Moderately severe disability     Balance Overall balance assessment: Needs assistance Sitting-balance support: No upper extremity supported, Feet supported Sitting balance-Leahy Scale: Fair     Standing balance support: During functional activity, Single extremity supported Standing balance-Leahy Scale: Poor Standing balance comment: Requiring RW or UE support for balance                            Cognition Arousal/Alertness: Awake/alert Behavior During Therapy: Flat affect Overall Cognitive Status: No family/caregiver present to determine baseline cognitive functioning Area of Impairment: Problem solving  Problem Solving: Slow processing, Requires verbal cues, Requires tactile cues General Comments: Slower processing, but very participatory and otherwise cognitively appropriate        Exercises General Exercises -  Lower Extremity Heel Slides: Both, 10 reps, Supine Other Exercises Other Exercises: supine: bridging x 10 Other Exercises: Sitting: functional reaching with BUE's    General Comments        Pertinent Vitals/Pain Pain Assessment Pain Assessment: No/denies pain    Home Living                          Prior Function            PT Goals (current goals can now be found in the care plan section) Acute Rehab PT Goals Potential to Achieve Goals: Good Progress towards PT goals: Progressing toward goals    Frequency    Min 4X/week      PT Plan Current plan remains appropriate    Co-evaluation              AM-PAC PT "6 Clicks" Mobility   Outcome Measure  Help needed turning from your back to your side while in a flat bed without using bedrails?: A Little Help needed moving from lying on your back to sitting on the side of a flat bed without using bedrails?: A Little Help needed moving to and from a bed to a chair (including a wheelchair)?: A Little Help needed standing up from a chair using your arms (e.g., wheelchair or bedside chair)?: A Lot Help needed to walk in hospital room?: A Lot Help needed climbing 3-5 steps with a railing? : A Lot 6 Click Score: 15    End of Session Equipment Utilized During Treatment: Gait belt Activity Tolerance: Patient tolerated treatment well Patient left: in bed;with call bell/phone within reach;with bed alarm set Nurse Communication: Mobility status PT Visit Diagnosis: Other abnormalities of gait and mobility (R26.89);Hemiplegia and hemiparesis Hemiplegia - Right/Left: Right Hemiplegia - dominant/non-dominant: Dominant Hemiplegia - caused by: Cerebral infarction     Time: 2841-3244 PT Time Calculation (min) (ACUTE ONLY): 23 min  Charges:  $Gait Training: 8-22 mins $Therapeutic Activity: 8-22 mins                     Wyona Almas, PT, DPT Acute Rehabilitation Services Office [phone]    Deno Etienne 08/09/2022, 4:57 PM

## 2022-08-09 NOTE — Progress Notes (Signed)
[facility]                                                                              Carlos Shields, is a 89 y.o. male, DOB - 04/30/1931, OMV:[id number] Admit date - 08/05/2022    Outpatient Primary MD for the patient is [name], PA  LOS - 3  days  Chief Complaint  Patient presents with   Code Stroke   Seizures       Brief summary   Patient is a 89 year old male with history of seizure on antiepileptics, CVA, hypertension, CKD stage IIIa, dysphagia who presented with right-sided weakness.  Patient's daughter reported that normally ambulates with a walker.  On the day of admission, around 1 to 2 PM, he wet his bed which was unusual for him, she saw him falling out of the bed and seemed to be dragging his leg.  Right hand appeared twisted so the daughter thought that he was having a seizure and called EMS.  He had missed few doses of his Keppra due to issues with pharmacy and ran out of aspirin for a week.  Patient's son also had died the previous week.  CT head was negative.  No tPA or thrombectomy as he was outside window.  Patient was evaluated over telemetry by Dr. Leonel Ramsay, recommended MRI brain.  Patient received additional Keppra MRI brain showed multiple small acute infarcts involving the left basal ganglia, left frontal, parietal and occipital cortex and adjacent white matter.  Slight edema without mass effect. Admitted for further workup.  Assessment & Plan     Acute CVA (cerebral vascular accident) ([place]) with prior history of CVA -MRI brain showed multiple small acute infarct involving the left basal ganglia, left frontal, right occipital cortex and adjacent white matter.  Slight edema without mass effect.  Distribution of stroke appears to be embolic but no prior history of A-fib. MRA Negative intracranial MRA for large vessel occlusion.significant for Intracranial atherosclerotic disease with associated moderate stenosis at the left carotid  siphon, with additional severe distal left P3 stenosis. No other proximal high-grade or correctable stenosis. -Lipid panel showed cholesterol 143, LDL 69. -PT/OT consulted, recommendation for CIR -He is on aspirin, continued, decision regarding Plavix pending vascular evaluation  Carotid artery stenosis -Vascular surgery consult greatly appreciated, plan for surgical intervention this Wednesday or Thursday  Seizures ([place]) -Due to concern for possible breakthrough seizure, patient received additional 1 g IV Keppra in ED -Continue Keppra 500 mg twice daily -Seizure precautions  Acute on chronic CKD, stage IIIa -Baseline around 1.2, peaked at 2.1, continues to improve -Renal ultrasound significant for chronic medical disease, no hydronephrosis  HTN (hypertension) -Permissive hypertension, day 4 today, will start gradually on regimen, low-dose metoprolol today.  Chronic normocytic anemia -Hemoglobin baseline 9-10 -Currently at baseline   Abnormal TSH -TSH 4.9, obtain free T4  Acute metabolic encephalopathy -Likely due to acute CVA, and hospital delirium as well.  Estimated body mass index is 19.56 kg/m as calculated from the following:   Height as of this encounter: 5\' 4"  (1.626 m).   Weight as of this encounter: 51.7 kg.  Code Status:  Full CODE STATUS DVT Prophylaxis:  enoxaparin (LOVENOX) injection 30 mg Start: 08/05/22 2200   Level of Care: Level of care: Telemetry Medical Family Communication: None at bedside Disposition Plan:         Procedures:  MRI brain, MRA  Consultants:   Neurology Vascular surgery  Antimicrobials: None   Medications  aspirin EC  81 mg Oral Daily   enoxaparin (LOVENOX) injection  30 mg Subcutaneous Q24H   levETIRAcetam  500 mg Oral BID   pantoprazole  40 mg Oral Daily      Subjective:   no significant events overnight as discussed with staff, he denies any complaints today.   Objective:   Vitals:   08/08/22 1939 08/08/22  2339 08/09/22 0325 08/09/22 0819  BP: (!) 154/75 (!) 153/82 (!) 161/79 (!) 163/82  Pulse: 78 77 67 70  Resp: 16 16 16 14   Temp: 98.4 F (36.9 C) 98.3 F (36.8 C) 98.3 F (36.8 C) 98.3 F (36.8 C)  TempSrc: Oral Oral Oral Oral  SpO2: 100% 96% 99% 100%  Weight:      Height:        Intake/Output Summary (Last 24 hours) at 08/09/2022 1001 Last data filed at 08/09/2022 0900 Gross per 24 hour  Intake 320 ml  Output 1800 ml  Net -1480 ml     Wt Readings from Last 3 Encounters:  08/05/22 51.7 kg  06/16/22 51.7 kg  04/25/21 54.4 kg     Exam  Awake Alert, confused, pleasant, no apparent distress Symmetrical Chest wall movement, Good air movement bilaterally, CTAB RRR,No Gallops,Rubs or new Murmurs, No Parasternal Heave +ve B.Sounds, Abd Soft, No tenderness, No rebound - guarding or rigidity. No Cyanosis, Clubbing or edema, No new Rash or bruise      Data Reviewed:  I have personally reviewed following labs    CBC Lab Results  Component Value Date   WBC 5.6 08/09/2022   RBC 3.07 (L) 08/09/2022   HGB 9.0 (L) 08/09/2022   HCT 26.8 (L) 08/09/2022   MCV 87.3 08/09/2022   MCH 29.3 08/09/2022   PLT 179 08/09/2022   MCHC 33.6 08/09/2022   RDW 12.4 08/09/2022   LYMPHSABS 2.3 08/05/2022   MONOABS 0.8 08/05/2022   EOSABS 0.0 08/05/2022   BASOSABS 0.1 28/78/6767     Last metabolic panel Lab Results  Component Value Date   NA 135 08/09/2022   K 3.7 08/09/2022   CL 106 08/09/2022   CO2 21 (L) 08/09/2022   BUN 17 08/09/2022   CREATININE 1.34 (H) 08/09/2022   GLUCOSE 83 08/09/2022   GFRNONAA 51 (L) 08/09/2022   GFRAA 51 (L) 11/19/2019   CALCIUM 8.4 (L) 08/09/2022   PHOS 2.9 05/04/2013   PROT 7.8 08/05/2022   ALBUMIN 4.3 08/05/2022   BILITOT 1.0 08/05/2022   ALKPHOS 66 08/05/2022   AST 29 08/05/2022   ALT 13 08/05/2022   ANIONGAP 8 08/09/2022    CBG (last 3)  No results for input(s): "GLUCAP" in the last 72 hours.     Coagulation Profile: Recent Labs   Lab 08/05/22 1428  INR 1.3*     Radiology Studies: I have personally reviewed the imaging studies  CT ANGIO NECK W OR WO CONTRAST  Result Date: 08/08/2022 CLINICAL DATA:  Follow-up examination for stroke. EXAM: CT ANGIOGRAPHY NECK TECHNIQUE: Multidetector CT imaging of the neck was performed using the standard protocol during bolus administration of intravenous contrast. Multiplanar CT image reconstructions and MIPs were obtained to evaluate the vascular  anatomy. Carotid stenosis measurements (when applicable) are obtained utilizing NASCET criteria, using the distal internal carotid diameter as the denominator. RADIATION DOSE REDUCTION: This exam was performed according to the departmental dose-optimization program which includes automated exposure control, adjustment of the mA and/or kV according to patient size and/or use of iterative reconstruction technique. CONTRAST:  65mL OMNIPAQUE IOHEXOL 350 MG/ML SOLN COMPARISON:  Prior MRI from 08/05/2022. FINDINGS: Aortic arch: Visualized aortic arch normal caliber with standard branch pattern. Atheromatous change about the arch itself without high-grade stenosis. Right carotid system: Right common and internal carotid arteries are patent without dissection. Moderate to advanced atheromatous change about the right carotid bulb/proximal right ICA with associated stenosis of up to 65% by NASCET criteria. Calcified atherosclerosis noted about the right carotid siphon. Left carotid system: Left common and internal carotid arteries are patent without dissection. Moderate to advanced atheromatous change about the left carotid bulb/proximal left ICA with associated stenosis of up to 75% by NASCET criteria. Calcified atherosclerosis noted about the left carotid siphon. Vertebral arteries: Both vertebral arteries arise from subclavian arteries. Proximal subclavian atherosclerosis with associated up to 50% stenosis on the left (series 7, image 175). Vertebral arteries M  cells are patent without dissection or hemodynamically significant stenosis. Skeleton: No discrete or worrisome osseous lesions. Poor dentition noted. Other neck: No other acute soft tissue abnormality about the neck. Upper chest: Visualized upper chest demonstrates no acute finding. IMPRESSION: 1. Moderate to advanced atheromatous change about the carotid bifurcations/proximal ICAs with associated stenoses of up to 65% on the right and 75% on the left. 2. 50% proximal left subclavian artery stenosis. 3. Wide patency of both vertebral arteries within the neck. Aortic Atherosclerosis (ICD10-I70.0). Electronically Signed   By: Rise Mu M.D.   On: 08/08/2022 22:09       Huey Bienenstock M.D. [facility] 08/09/2022, 10:01 AM  Available via Epic secure chat 7am-7pm After 7 pm, please refer to night coverage provider listed on amion.

## 2022-08-10 DIAGNOSIS — I639 Cerebral infarction, unspecified: Secondary | ICD-10-CM | POA: Diagnosis not present

## 2022-08-10 DIAGNOSIS — I10 Essential (primary) hypertension: Secondary | ICD-10-CM | POA: Diagnosis not present

## 2022-08-10 DIAGNOSIS — R569 Unspecified convulsions: Secondary | ICD-10-CM | POA: Diagnosis not present

## 2022-08-10 NOTE — Progress Notes (Signed)
Physical Therapy Treatment Patient Details Name: Carlos Shields MRN: [id number] DOB: Jan 09, 1931 Today's Date: 08/10/2022   History of Present Illness Pt is 89 yo male admitted 08/05/22 with R sided weakness found to have small acute infarcts  involving the left basal ganglia, left frontal, parietal and occipital cortex and adjacent white matter.  Pt with hx of seizure on antiepileptics, CVA, hypertension, CKD stage IIIa, dysphagia    PT Comments    Patient is progressing very well towards their physical therapy goals, demonstrating improved activity tolerance and ambulation distance. Pt requiring moderate assist for transfers and ambulating 120 ft with a walker at a minimal assist level. Intermittent assist provided for steering walker, particularly with turns. Worked on functional reaching with BUE's to address dynamic sitting balance and RUE weightbearing/coordination/ROM. Continue to recommend acute inpatient rehab (AIR) for post-acute therapy needs.     Recommendations for follow up therapy are one component of a multi-disciplinary discharge planning process, led by the attending physician.  Recommendations may be updated based on patient status, additional functional criteria and insurance authorization.  Follow Up Recommendations  Acute inpatient rehab (3hours/day)     Assistance Recommended at Discharge Frequent or constant Supervision/Assistance  Patient can return home with the following A lot of help with walking and/or transfers;A lot of help with bathing/dressing/bathroom   Equipment Recommendations  None recommended by PT    Recommendations for Other Services       Precautions / Restrictions Precautions Precautions: Fall;Other (comment) Precaution Comments: incontinent Restrictions Weight Bearing Restrictions: No     Mobility  Bed Mobility Overal bed mobility: Needs Assistance Bed Mobility: Supine to Sit     Supine to sit: Min assist     General bed mobility  comments: Assist to execute bringing trunk to upright position. Increased time for reciprocal scooting to EOB    Transfers Overall transfer level: Needs assistance Equipment used: Rolling walker (2 wheels) Transfers: Sit to/from Stand Sit to Stand: Mod assist           General transfer comment: modA to rise from edge of bed, increased posterior lean initially and increased trunk flexion    Ambulation/Gait Ambulation/Gait assistance: Min assist Gait Distance (Feet): 120 Feet Assistive device: Rolling walker (2 wheels) Gait Pattern/deviations: Decreased stride length, Narrow base of support, Trunk flexed, Leaning posteriorly, Decreased step length - right, Decreased step length - left, Shuffle Gait velocity: decreased Gait velocity interpretation: <1.8 ft/sec, indicate of risk for recurrent falls   General Gait Details: verbal cues for wider BOS and increased stride length, intermittent assist for steering walker, particularly with turns   Stairs             Wheelchair Mobility    Modified Rankin (Stroke Patients Only) Modified Rankin (Stroke Patients Only) Pre-Morbid Rankin Score: No significant disability Modified Rankin: Moderately severe disability     Balance Overall balance assessment: Needs assistance Sitting-balance support: No upper extremity supported, Feet supported Sitting balance-Leahy Scale: Fair     Standing balance support: During functional activity, Single extremity supported Standing balance-Leahy Scale: Poor Standing balance comment: Requiring RW or UE support for balance                            Cognition Arousal/Alertness: Awake/alert Behavior During Therapy: Flat affect Overall Cognitive Status: No family/caregiver present to determine baseline cognitive functioning Area of Impairment: Problem solving  Problem Solving: Slow processing, Requires verbal cues, Requires tactile  cues General Comments: Slower processing, but very participatory and otherwise cognitively appropriate        Exercises Other Exercises Other Exercises: Sitting: BUE table slides multidirectional (10 reps x 3 sets). When reaching with LUE, placed R hand on edge of table to promote increased weightbearing when reaching forward    General Comments        Pertinent Vitals/Pain Pain Assessment Pain Assessment: No/denies pain    Home Living                          Prior Function            PT Goals (current goals can now be found in the care plan section) Acute Rehab PT Goals Potential to Achieve Goals: Good Progress towards PT goals: Progressing toward goals    Frequency    Min 3X/week      PT Plan Frequency needs to be updated    Co-evaluation              AM-PAC PT "6 Clicks" Mobility   Outcome Measure  Help needed turning from your back to your side while in a flat bed without using bedrails?: A Little Help needed moving from lying on your back to sitting on the side of a flat bed without using bedrails?: A Little Help needed moving to and from a bed to a chair (including a wheelchair)?: A Little Help needed standing up from a chair using your arms (e.g., wheelchair or bedside chair)?: A Lot Help needed to walk in hospital room?: A Little Help needed climbing 3-5 steps with a railing? : A Lot 6 Click Score: 16    End of Session Equipment Utilized During Treatment: Gait belt Activity Tolerance: Patient tolerated treatment well Patient left: with call bell/phone within reach;in chair;with chair alarm set Nurse Communication: Mobility status PT Visit Diagnosis: Other abnormalities of gait and mobility (R26.89);Hemiplegia and hemiparesis Hemiplegia - Right/Left: Right Hemiplegia - dominant/non-dominant: Dominant Hemiplegia - caused by: Cerebral infarction     Time: 1110-1134 PT Time Calculation (min) (ACUTE ONLY): 24 min  Charges:  $Gait  Training: 8-22 mins $Therapeutic Activity: 8-22 mins                     Wyona Almas, PT, DPT Acute Rehabilitation Services Office [phone]    Deno Etienne 08/10/2022, 1:03 PM

## 2022-08-10 NOTE — Progress Notes (Signed)
Inpatient Rehabilitation Admissions Coordinator   Noted plan for TCAR 2/1. I will begin insurance Auth for possible CIR postoperative. I met with patient at bedside and he is aware.  Danne Baxter, RN, MSN Rehab Admissions Coordinator [phone] 08/10/2022 12:42 PM

## 2022-08-10 NOTE — Progress Notes (Addendum)
  Progress Note    08/10/2022 6:48 AM Hospital Day 4  Subjective:  pt sleeping and wakes easily.  Says he feels like he is getting stronger.   afebrile  Vitals:   08/09/22 2330 08/10/22 0300  BP: (!) 151/80 (!) 149/76  Pulse: (!) 58 60  Resp: 18 18  Temp: 98.3 F (36.8 C) 98.6 F (37 C)  SpO2: 99% 96%    Physical Exam: General:  no distress Lungs:  non labored neuro:  hand grips appear equal bilaterally  CBC    Component Value Date/Time   WBC 5.6 08/09/2022 0341   RBC 3.07 (L) 08/09/2022 0341   HGB 9.0 (L) 08/09/2022 0341   HCT 26.8 (L) 08/09/2022 0341   PLT 179 08/09/2022 0341   MCV 87.3 08/09/2022 0341   MCH 29.3 08/09/2022 0341   MCHC 33.6 08/09/2022 0341   RDW 12.4 08/09/2022 0341   LYMPHSABS 2.3 08/05/2022 1428   MONOABS 0.8 08/05/2022 1428   EOSABS 0.0 08/05/2022 1428   BASOSABS 0.1 08/05/2022 1428    BMET    Component Value Date/Time   NA 135 08/09/2022 0341   K 3.7 08/09/2022 0341   CL 106 08/09/2022 0341   CO2 21 (L) 08/09/2022 0341   GLUCOSE 83 08/09/2022 0341   BUN 17 08/09/2022 0341   CREATININE 1.34 (H) 08/09/2022 0341   CALCIUM 8.4 (L) 08/09/2022 0341   GFRNONAA 51 (L) 08/09/2022 0341   GFRAA 51 (L) 11/19/2019 2118    INR    Component Value Date/Time   INR 1.3 (H) 08/05/2022 1428     Intake/Output Summary (Last 24 hours) at 08/10/2022 0648 Last data filed at 08/09/2022 1900 Gross per 24 hour  Intake 320 ml  Output 1150 ml  Net -830 ml     Assessment/Plan:  89 y.o. male with symptomatic left ICA stenosis  Hospital Day 4  -plan for left TCAR on 2/1.  Creatinine improving at 1.34 today, down from 1.52 08/07/2022. -pt on asa/plavix/crestor.   Leontine Locket, PA-C [facility] [phone] 08/10/2022 6:48 AM   I have independently interviewed and examined patient and agree with PA assessment and plan above. Symptomatic left ica stenosis plan for L TCAR this Thursday. Continue aspirin, plavix and crestor.    Marrio Scribner C. Donzetta Matters, MD [facility] [place] Office: [phone] Pager: [phone]

## 2022-08-10 NOTE — Progress Notes (Signed)
[facility]                                                                              Carlos Shields, is a 89 y.o. male, DOB - 25-Jun-1931, VEL:[id number] Admit date - 08/05/2022    Outpatient Primary MD for the patient is Carlos Sailors, PA  LOS - 4  days  Chief Complaint  Patient presents with   Code Stroke   Seizures       Brief summary   Patient is a 89 year old male with history of seizure on antiepileptics, CVA, hypertension, CKD stage IIIa, dysphagia who presented with right-sided weakness.  Patient's daughter reported that normally ambulates with a walker.  On the day of admission, around 1 to 2 PM, he wet his bed which was unusual for him, she saw him falling out of the bed and seemed to be dragging his leg.  Right hand appeared twisted so the daughter thought that he was having a seizure and called EMS.  He had missed few doses of his Keppra due to issues with pharmacy and ran out of aspirin for a week.  Patient's son also had died the previous week. CT head was negative.  No tPA or thrombectomy as he was outside window.  His MRI significant for acute CVA, workup significant for carotid artery stenosis, vascular surgery consulted, and plan for carotid artery surgery.  Assessment & Plan     Acute CVA (cerebral vascular accident) ([place]) with prior history of CVA -MRI brain showed multiple small acute infarct involving the left basal ganglia, left frontal, right occipital cortex and adjacent white matter.  Slight edema without mass effect.  Distribution of stroke appears to be embolic but no prior history of A-fib. - MRA Head Negative intracranial MRA for large vessel occlusion.significant for Intracranial atherosclerotic disease with associated moderate stenosis at the left carotid siphon, with additional severe distal left P3 stenosis. No other proximal high-grade or correctable stenosis. -Lipid panel showed cholesterol 143, LDL 69. -PT/OT consulted,  recommendation for CIR -He is on aspirin, and plavix -See discussion below under carotid artery stenosis.  Left ICA stenosis -Vascular surgery consult greatly appreciated, plan for surgical intervention this Wednesday or Thursday  Seizures ([place]) -Due to concern for possible breakthrough seizure, patient received additional 1 g IV Keppra in ED -Continue Keppra 500 mg twice daily -Seizure precautions  Acute on chronic CKD, stage IIIa -Baseline around 1.2, peaked at 2.1, continues to improve -Renal ultrasound significant for chronic medical disease, no hydronephrosis  HTN (hypertension) -Permissive hypertension initially, he is currently day 5, gradually resuming his meds, he is on low-dose Coreg currently, will uptitrate as blood pressure tolerates.  Chronic normocytic anemia -Hemoglobin baseline 9-10 -Currently at baseline   Abnormal TSH -TSH 4.9  Acute metabolic encephalopathy -Likely due to acute CVA, and hospital delirium as well.  Estimated body mass index is 19.56 kg/m as calculated from the following:   Height as of this encounter: 5\' 4"  (1.626 m).   Weight as of this encounter: 51.7 kg.  Code Status: Full CODE STATUS DVT Prophylaxis:  enoxaparin (LOVENOX) injection 30 mg Start: 08/05/22 2200  Level of Care: Level of care: Telemetry Medical Family Communication: None at bedside, tried to reach daughter by phone, no answer, unable to leave a voicemail Disposition Plan:    CIR evaluating patient,   Procedures:  MRI brain, MRA  Consultants:   Neurology Vascular surgery  Antimicrobials: None   Medications  aspirin EC  81 mg Oral Daily   clopidogrel  75 mg Oral Daily   enoxaparin (LOVENOX) injection  30 mg Subcutaneous Q24H   levETIRAcetam  500 mg Oral BID   metoprolol tartrate  12.5 mg Oral BID   pantoprazole  40 mg Oral Daily   rosuvastatin  10 mg Oral Daily      Subjective:   No significant events as discussed with staff, patient denies any  complaints, but he is confused.   Objective:   Vitals:   08/09/22 1948 08/09/22 2330 08/10/22 0300 08/10/22 0805  BP: (!) 141/73 (!) 151/80 (!) 149/76 137/75  Pulse: 65 (!) 58 60 80  Resp: 16 18 18 18   Temp: 98.6 F (37 C) 98.3 F (36.8 C) 98.6 F (37 C) 98 F (36.7 C)  TempSrc:  Oral Oral   SpO2: 98% 99% 96% 98%  Weight:      Height:        Intake/Output Summary (Last 24 hours) at 08/10/2022 1039 Last data filed at 08/09/2022 1900 Gross per 24 hour  Intake --  Output 1150 ml  Net -1150 ml     Wt Readings from Last 3 Encounters:  08/05/22 51.7 kg  06/16/22 51.7 kg  04/25/21 54.4 kg     Exam  Awake Alert, confused, pleasant, no apparent distress Symmetrical Chest wall movement, Good air movement bilaterally, CTAB RRR,No Gallops,Rubs or new Murmurs, No Parasternal Heave +ve B.Sounds, Abd Soft, No tenderness, No rebound - guarding or rigidity. No Cyanosis, Clubbing or edema, No new Rash or bruise       Data Reviewed:  I have personally reviewed following labs    CBC Lab Results  Component Value Date   WBC 5.6 08/09/2022   RBC 3.07 (L) 08/09/2022   HGB 9.0 (L) 08/09/2022   HCT 26.8 (L) 08/09/2022   MCV 87.3 08/09/2022   MCH 29.3 08/09/2022   PLT 179 08/09/2022   MCHC 33.6 08/09/2022   RDW 12.4 08/09/2022   LYMPHSABS 2.3 08/05/2022   MONOABS 0.8 08/05/2022   EOSABS 0.0 08/05/2022   BASOSABS 0.1 22/29/7989     Last metabolic panel Lab Results  Component Value Date   NA 135 08/09/2022   K 3.7 08/09/2022   CL 106 08/09/2022   CO2 21 (L) 08/09/2022   BUN 17 08/09/2022   CREATININE 1.34 (H) 08/09/2022   GLUCOSE 83 08/09/2022   GFRNONAA 51 (L) 08/09/2022   GFRAA 51 (L) 11/19/2019   CALCIUM 8.4 (L) 08/09/2022   PHOS 2.9 05/04/2013   PROT 7.8 08/05/2022   ALBUMIN 4.3 08/05/2022   BILITOT 1.0 08/05/2022   ALKPHOS 66 08/05/2022   AST 29 08/05/2022   ALT 13 08/05/2022   ANIONGAP 8 08/09/2022    CBG (last 3)  No results for input(s): "GLUCAP"  in the last 72 hours.     Coagulation Profile: Recent Labs  Lab 08/05/22 1428  INR 1.3*     Radiology Studies: I have personally reviewed the imaging studies  CT ANGIO NECK W OR WO CONTRAST  Result Date: 08/08/2022 CLINICAL DATA:  Follow-up examination for stroke. EXAM: CT ANGIOGRAPHY NECK TECHNIQUE: Multidetector CT imaging of the neck  was performed using the standard protocol during bolus administration of intravenous contrast. Multiplanar CT image reconstructions and MIPs were obtained to evaluate the vascular anatomy. Carotid stenosis measurements (when applicable) are obtained utilizing NASCET criteria, using the distal internal carotid diameter as the denominator. RADIATION DOSE REDUCTION: This exam was performed according to the departmental dose-optimization program which includes automated exposure control, adjustment of the mA and/or kV according to patient size and/or use of iterative reconstruction technique. CONTRAST:  60mL OMNIPAQUE IOHEXOL 350 MG/ML SOLN COMPARISON:  Prior MRI from 08/05/2022. FINDINGS: Aortic arch: Visualized aortic arch normal caliber with standard branch pattern. Atheromatous change about the arch itself without high-grade stenosis. Right carotid system: Right common and internal carotid arteries are patent without dissection. Moderate to advanced atheromatous change about the right carotid bulb/proximal right ICA with associated stenosis of up to 65% by NASCET criteria. Calcified atherosclerosis noted about the right carotid siphon. Left carotid system: Left common and internal carotid arteries are patent without dissection. Moderate to advanced atheromatous change about the left carotid bulb/proximal left ICA with associated stenosis of up to 75% by NASCET criteria. Calcified atherosclerosis noted about the left carotid siphon. Vertebral arteries: Both vertebral arteries arise from subclavian arteries. Proximal subclavian atherosclerosis with associated up to 50%  stenosis on the left (series 7, image 175). Vertebral arteries M cells are patent without dissection or hemodynamically significant stenosis. Skeleton: No discrete or worrisome osseous lesions. Poor dentition noted. Other neck: No other acute soft tissue abnormality about the neck. Upper chest: Visualized upper chest demonstrates no acute finding. IMPRESSION: 1. Moderate to advanced atheromatous change about the carotid bifurcations/proximal ICAs with associated stenoses of up to 65% on the right and 75% on the left. 2. 50% proximal left subclavian artery stenosis. 3. Wide patency of both vertebral arteries within the neck. Aortic Atherosclerosis (ICD10-I70.0). Electronically Signed   By: Jeannine Boga M.D.   On: 08/08/2022 22:09       Phillips Climes M.D. [facility] 08/10/2022, 10:39 AM  Available via Epic secure chat 7am-7pm After 7 pm, please refer to night coverage provider listed on amion.

## 2022-08-10 NOTE — Progress Notes (Signed)
Mobility Specialist: Progress Note   08/10/22 1550  Mobility  Activity Ambulated with assistance in hallway  Level of Assistance Moderate assist, patient does 50-74%  Assistive Device Front wheel walker  Distance Ambulated (ft) 150 ft  Activity Response Tolerated well  Mobility Referral Yes  $Mobility charge 1 Mobility   Pt received in the chair and agreeable to mobility. ModA to stand with posterior lean; able to correct once taking steps. Verbal cues to increase stride length as well as for wider BOS. No c/o throughout ambulation. Pt back to the chair after session with call bell at his side. Chair alarm is on.   Fellows Aleila Syverson Mobility Specialist Please contact via SecureChat or Rehab office at [phone]

## 2022-08-11 ENCOUNTER — Encounter (HOSPITAL_COMMUNITY): Payer: Self-pay | Admitting: Family Medicine

## 2022-08-11 DIAGNOSIS — N179 Acute kidney failure, unspecified: Secondary | ICD-10-CM | POA: Diagnosis not present

## 2022-08-11 DIAGNOSIS — I639 Cerebral infarction, unspecified: Secondary | ICD-10-CM | POA: Diagnosis not present

## 2022-08-11 LAB — BASIC METABOLIC PANEL
Anion gap: 7 (ref 5–15)
BUN: 33 mg/dL — ABNORMAL HIGH (ref 8–23)
CO2: 23 mmol/L (ref 22–32)
Calcium: 8.7 mg/dL — ABNORMAL LOW (ref 8.9–10.3)
Chloride: 103 mmol/L (ref 98–111)
Creatinine, Ser: 1.82 mg/dL — ABNORMAL HIGH (ref 0.61–1.24)
GFR, Estimated: 35 mL/min — ABNORMAL LOW (ref >60)
Glucose, Bld: 95 mg/dL (ref 70–99)
Potassium: 3.8 mmol/L (ref 3.5–5.1)
Sodium: 133 mmol/L — ABNORMAL LOW (ref 135–145)

## 2022-08-11 LAB — CBC
HCT: 28 % — ABNORMAL LOW (ref 39.0–52.0)
Hemoglobin: 9.4 g/dL — ABNORMAL LOW (ref 13.0–17.0)
MCH: 29.1 pg (ref 26.0–34.0)
MCHC: 33.6 g/dL (ref 30.0–36.0)
MCV: 86.7 fL (ref 80.0–100.0)
Platelets: 190 10*3/uL (ref 150–400)
RBC: 3.23 MIL/uL — ABNORMAL LOW (ref 4.22–5.81)
RDW: 12.3 % (ref 11.5–15.5)
WBC: 5.8 10*3/uL (ref 4.0–10.5)
nRBC: 0 % (ref 0.0–0.2)

## 2022-08-11 MED ORDER — SODIUM CHLORIDE 0.45 % IV SOLN: INTRAVENOUS | Status: DC

## 2022-08-11 MED ORDER — CEFAZOLIN SODIUM-DEXTROSE 2-4 GM/100ML-% IV SOLN
2.0000 g | INTRAVENOUS | Status: AC
  Administered 2022-08-12: 2 g via INTRAVENOUS

## 2022-08-11 NOTE — Progress Notes (Signed)
Mobility Specialist Progress Note   08/11/22 1215  Mobility  Activity Ambulated with assistance in hallway;Transferred from bed to chair  Level of Assistance Moderate assist, patient does 50-74%  Assistive Device Front wheel walker  Distance Ambulated (ft) 150 ft  Range of Motion/Exercises Active;All extremities  Activity Response Tolerated well   Patient received in supine and agreeable to participate. Required mod A to assist with bed mobility, to stand, and to ambulate in hallway. Required multimodal cues for hand placement on RW and stand upright to maintain proximal distance to RW. Ambulated with slow shuffle like gait and needed min A to balance. Returned to room without complaint or incident. Was left in recliner with all needs met, call bell in reach.   [place] Aayat Hajjar, BS EXP Mobility Specialist Please contact via SecureChat or Rehab office at [phone]

## 2022-08-11 NOTE — Progress Notes (Signed)
Physical Therapy Treatment Patient Details Name: Carlos Shields MRN: [id number] DOB: June 25, 1931 Today's Date: 08/11/2022   History of Present Illness Pt is 89 yo male admitted 08/05/22 with R sided weakness found to have small acute infarcts  involving the left basal ganglia, left frontal, parietal and occipital cortex and adjacent white matter.  Pt with hx of seizure on antiepileptics, CVA, hypertension, CKD stage IIIa, dysphagia    PT Comments    Pt received in chair and motivated to participate. Focus on RUE strengthening, transfer training and gait training. Pt continues to require moderate assist for transfers and ambulating 120 ft with a walker at a min assist level. Demonstrates RUE incoordination, weakness, impaired standing balance, and gait abnormalities. Continue to recommend acute inpatient rehab (AIR) for post-acute therapy needs.    Recommendations for follow up therapy are one component of a multi-disciplinary discharge planning process, led by the attending physician.  Recommendations may be updated based on patient status, additional functional criteria and insurance authorization.  Follow Up Recommendations  Acute inpatient rehab (3hours/day)     Assistance Recommended at Discharge Frequent or constant Supervision/Assistance  Patient can return home with the following A lot of help with walking and/or transfers;A lot of help with bathing/dressing/bathroom   Equipment Recommendations  None recommended by PT    Recommendations for Other Services       Precautions / Restrictions Precautions Precautions: Fall;Other (comment) Precaution Comments: incontinent Restrictions Weight Bearing Restrictions: No     Mobility  Bed Mobility               General bed mobility comments: OOB in chair    Transfers Overall transfer level: Needs assistance Equipment used: Rolling walker (2 wheels) Transfers: Sit to/from Stand Sit to Stand: Mod assist           General  transfer comment: modA to rise from edge of bed, increased posterior lean initially and increased trunk flexion    Ambulation/Gait Ambulation/Gait assistance: Min assist Gait Distance (Feet): 120 Feet Assistive device: Rolling walker (2 wheels) Gait Pattern/deviations: Decreased stride length, Narrow base of support, Trunk flexed, Leaning posteriorly, Decreased step length - right, Decreased step length - left, Shuffle Gait velocity: decreased     General Gait Details: verbal cues for wider BOS and increased stride length, in addition to walker proximity and upright posture   Stairs             Wheelchair Mobility    Modified Rankin (Stroke Patients Only) Modified Rankin (Stroke Patients Only) Pre-Morbid Rankin Score: No significant disability Modified Rankin: Moderately severe disability     Balance Overall balance assessment: Needs assistance Sitting-balance support: No upper extremity supported, Feet supported Sitting balance-Leahy Scale: Fair     Standing balance support: During functional activity, Single extremity supported Standing balance-Leahy Scale: Poor Standing balance comment: Requiring RW or UE support for balance                            Cognition Arousal/Alertness: Awake/alert Behavior During Therapy: Flat affect Overall Cognitive Status: No family/caregiver present to determine baseline cognitive functioning Area of Impairment: Problem solving                             Problem Solving: Slow processing, Requires verbal cues, Requires tactile cues General Comments: Slower processing, but very participatory and otherwise cognitively appropriate        Exercises  Other Exercises Other Exercises: x3 sit to stands Other Exercises: Sitting: AAROM RUE D1/2 x 10 each    General Comments        Pertinent Vitals/Pain      Home Living                          Prior Function            PT Goals (current  goals can now be found in the care plan section) Acute Rehab PT Goals Potential to Achieve Goals: Good Progress towards PT goals: Progressing toward goals    Frequency    Min 3X/week      PT Plan Current plan remains appropriate    Co-evaluation              AM-PAC PT "6 Clicks" Mobility   Outcome Measure  Help needed turning from your back to your side while in a flat bed without using bedrails?: A Little Help needed moving from lying on your back to sitting on the side of a flat bed without using bedrails?: A Little Help needed moving to and from a bed to a chair (including a wheelchair)?: A Little Help needed standing up from a chair using your arms (e.g., wheelchair or bedside chair)?: A Lot Help needed to walk in hospital room?: A Little Help needed climbing 3-5 steps with a railing? : A Lot 6 Click Score: 16    End of Session Equipment Utilized During Treatment: Gait belt Activity Tolerance: Patient tolerated treatment well Patient left: with call bell/phone within reach;in chair;with chair alarm set Nurse Communication: Mobility status PT Visit Diagnosis: Other abnormalities of gait and mobility (R26.89);Hemiplegia and hemiparesis Hemiplegia - Right/Left: Right Hemiplegia - dominant/non-dominant: Dominant Hemiplegia - caused by: Cerebral infarction     Time: 8756-4332 PT Time Calculation (min) (ACUTE ONLY): 24 min  Charges:  $Gait Training: 8-22 mins $Therapeutic Activity: 8-22 mins                     Wyona Almas, PT, DPT Acute Rehabilitation Services Office [phone]    Carlos Shields 08/11/2022, 4:40 PM

## 2022-08-11 NOTE — Progress Notes (Signed)
  Progress Note    08/11/2022 6:43 AM Hospital Day 5  Subjective:  no complaints; no new events  afebrile  Vitals:   08/10/22 2352 08/11/22 0330  BP: (!) 150/90 127/65  Pulse: (!) 102 66  Resp: 18 18  Temp: 98.2 F (36.8 C) 98.1 F (36.7 C)  SpO2: 97% 99%    Physical Exam: General:  resting comfortably Lungs:  non labored Extremities:  hand grips equal bilaterally  CBC    Component Value Date/Time   WBC 5.6 08/09/2022 0341   RBC 3.07 (L) 08/09/2022 0341   HGB 9.0 (L) 08/09/2022 0341   HCT 26.8 (L) 08/09/2022 0341   PLT 179 08/09/2022 0341   MCV 87.3 08/09/2022 0341   MCH 29.3 08/09/2022 0341   MCHC 33.6 08/09/2022 0341   RDW 12.4 08/09/2022 0341   LYMPHSABS 2.3 08/05/2022 1428   MONOABS 0.8 08/05/2022 1428   EOSABS 0.0 08/05/2022 1428   BASOSABS 0.1 08/05/2022 1428    BMET    Component Value Date/Time   NA 135 08/09/2022 0341   K 3.7 08/09/2022 0341   CL 106 08/09/2022 0341   CO2 21 (L) 08/09/2022 0341   GLUCOSE 83 08/09/2022 0341   BUN 17 08/09/2022 0341   CREATININE 1.34 (H) 08/09/2022 0341   CALCIUM 8.4 (L) 08/09/2022 0341   GFRNONAA 51 (L) 08/09/2022 0341   GFRAA 51 (L) 11/19/2019 2118    INR    Component Value Date/Time   INR 1.3 (H) 08/05/2022 1428     Intake/Output Summary (Last 24 hours) at 08/11/2022 0643 Last data filed at 08/11/2022 0263 Gross per 24 hour  Intake --  Output 950 ml  Net -950 ml     Assessment/Plan:  89 y.o. male with symptomatic left ICA stenosis  Hospital Day 5  -plan for left TCAR tomorrow.   -consent/labs/npo after MN ordered.  Labs today are pending -continue asa/statin/plavix   Leontine Locket, PA-C [facility] [phone] 08/11/2022 6:43 AM

## 2022-08-11 NOTE — Progress Notes (Signed)
[facility]                                                                              Eyoel Throgmorton, is a 89 y.o. male, DOB - [birth year], ACZ:[id number] Admit date - 08/05/2022    Outpatient Primary MD for the patient is [name], PA  LOS - 5  days  Chief Complaint  Patient presents with   Code Stroke   Seizures       Brief summary   Patient is a 89 year old male with history of seizure on antiepileptics, CVA, hypertension, CKD stage IIIa, dysphagia who presented with right-sided weakness.  Patient's daughter reported that normally ambulates with a walker.  On the day of admission, around 1 to 2 PM, he wet his bed which was unusual for him, she saw him falling out of the bed and seemed to be dragging his leg.  Right hand appeared twisted so the daughter thought that he was having a seizure and called EMS.  He had missed few doses of his Keppra due to issues with pharmacy and ran out of aspirin for a week.  Patient's son also had died the previous week. CT head was negative.  No tPA or thrombectomy as he was outside window.  His MRI significant for acute CVA, workup significant for carotid artery stenosis, vascular surgery consulted, and plan for carotid artery surgery.   Assessment & Plan    Acute CVA (cerebral vascular accident) ([place]) with prior history of CVA -MRI brain showed multiple small acute infarct involving the left basal ganglia, left frontal, right occipital cortex and adjacent white matter.  Slight edema without mass effect.  Distribution of stroke appears to be embolic but no prior history of A-fib. - MRA Head Negative intracranial MRA for large vessel occlusion.significant for Intracranial atherosclerotic disease with associated moderate stenosis at the left carotid siphon, with additional severe distal left P3 stenosis. No other proximal high-grade or correctable stenosis. -Lipid panel showed cholesterol 143, LDL 69. -PT/OT consulted,  recommendation for CIR -He is on aspirin, and plavix -See discussion below under carotid artery stenosis. Seems to be stable from a neurological standpoint.  Left ICA stenosis Seen by vascular surgery.  Plan is for TCAR on 2/1.  Seizures ([place]) -Due to concern for possible breakthrough seizure, patient received additional 1 g IV Keppra in ED -Continue Keppra 500 mg twice daily -Seizure precautions  Acute on chronic CKD, stage IIIa -Baseline around 1.2, peaked at 2.1. Creatinine noted to be 1.8 today.  Increased from 1.32 days ago.  Will gently hydrate. -Renal ultrasound significant for chronic medical disease, no hydronephrosis  Essential hypertension Initially allowed permissive hypertension.  Now on metoprolol.  Monitor blood pressures closely.  Avoid hypotension due to his carotid artery stenosis.  Chronic normocytic anemia -Hemoglobin baseline 9-10 -Currently at baseline   Abnormal TSH -TSH 4.9  Acute metabolic encephalopathy -Likely due to acute CVA, and hospital delirium as well.   Code Status: Full CODE STATUS DVT Prophylaxis:  enoxaparin (LOVENOX) injection 30 mg Start: 08/05/22 2200 Family Communication: Discussed with daughter who was at the bedside Disposition Plan:   CIR when medically  stable   Procedures:  MRI brain, MRA  Consultants:   Neurology Vascular surgery  Antimicrobials: None   Medications  aspirin EC  81 mg Oral Daily   clopidogrel  75 mg Oral Daily   enoxaparin (LOVENOX) injection  30 mg Subcutaneous Q24H   levETIRAcetam  500 mg Oral BID   metoprolol tartrate  12.5 mg Oral BID   pantoprazole  40 mg Oral Daily   rosuvastatin  10 mg Oral Daily      Subjective:   No complaints offered.  Mildly distracted.  Daughter is at the bedside.   Objective:   Vitals:   08/10/22 2352 08/11/22 0330 08/11/22 0718 08/11/22 1032  BP: (!) 150/90 127/65 123/69   Pulse: (!) 102 66 (!) 58 68  Resp: 18 18 16    Temp: 98.2 F (36.8 C) 98.1 F  (36.7 C) 99 F (37.2 C)   TempSrc: Oral Oral Oral   SpO2: 97% 99% 97%   Weight:      Height:        Intake/Output Summary (Last 24 hours) at 08/11/2022 1112 Last data filed at 08/11/2022 0900 Gross per 24 hour  Intake 240 ml  Output 1350 ml  Net -1110 ml      Wt Readings from Last 3 Encounters:  08/05/22 51.7 kg  06/16/22 51.7 kg  04/25/21 54.4 kg     Exam  General appearance: Awake alert.  In no distress.  Mildly distracted Resp: Clear to auscultation bilaterally.  Normal effort Cardio: S1-S2 is normal regular.  No S3-S4.  No rubs murmurs or bruit GI: Abdomen is soft.  Nontender nondistended.  Bowel sounds are present normal.  No masses organomegaly Extremities: No edema.      Data:   CBC Lab Results  Component Value Date   WBC 5.8 08/11/2022   RBC 3.23 (L) 08/11/2022   HGB 9.4 (L) 08/11/2022   HCT 28.0 (L) 08/11/2022   MCV 86.7 08/11/2022   MCH 29.1 08/11/2022   PLT 190 08/11/2022   MCHC 33.6 08/11/2022   RDW 12.3 08/11/2022   LYMPHSABS 2.3 08/05/2022   MONOABS 0.8 08/05/2022   EOSABS 0.0 08/05/2022   BASOSABS 0.1 25/85/2778     Last metabolic panel Lab Results  Component Value Date   NA 133 (L) 08/11/2022   K 3.8 08/11/2022   CL 103 08/11/2022   CO2 23 08/11/2022   BUN 33 (H) 08/11/2022   CREATININE 1.82 (H) 08/11/2022   GLUCOSE 95 08/11/2022   GFRNONAA 35 (L) 08/11/2022   GFRAA 51 (L) 11/19/2019   CALCIUM 8.7 (L) 08/11/2022   PHOS 2.9 05/04/2013   PROT 7.8 08/05/2022   ALBUMIN 4.3 08/05/2022   BILITOT 1.0 08/05/2022   ALKPHOS 66 08/05/2022   AST 29 08/05/2022   ALT 13 08/05/2022   ANIONGAP 7 08/11/2022     Coagulation Profile: Recent Labs  Lab 08/05/22 1428  INR 1.3*      Bonnielee Haff M.D. [facility] 08/11/2022, 11:12 AM

## 2022-08-11 NOTE — Anesthesia Preprocedure Evaluation (Signed)
Anesthesia Evaluation  Patient identified by MRN, date of birth, ID band Patient awake    Reviewed: Allergy & Precautions, NPO status , Patient's Chart, lab work & pertinent test results, reviewed documented beta blocker date and time   Airway Mallampati: II  TM Distance: >3 FB     Dental  (+) Missing, Dental Advisory Given, Poor Dentition   Pulmonary Current Smoker and Patient abstained from smoking.   Pulmonary exam normal breath sounds clear to auscultation       Cardiovascular hypertension, Pt. on medications and Pt. on home beta blockers + Peripheral Vascular Disease  Normal cardiovascular exam Rhythm:Regular Rate:Normal  Left Carotid stenosis  Vascular ultrasound 08/06/22 Right Carotid: Velocities in the right ICA are consistent with a 60-79%                 stenosis.   Left Carotid: Velocities in the left ICA are consistent with a 60-79%  stenosis.   Echo 08/06/22  1. Left ventricular ejection fraction, by estimation, is 60 to 65%. The  left ventricle has normal function. The left ventricle has no regional  wall motion abnormalities. Left ventricular diastolic parameters are  consistent with Grade I diastolic  dysfunction (impaired relaxation).   2. Right ventricular systolic function is normal. The right ventricular  size is normal.   3. The mitral valve is abnormal. Trivial mitral valve regurgitation. No  evidence of mitral stenosis.   4. The aortic valve is tricuspid. There is mild calcification of the  aortic valve. There is mild thickening of the aortic valve. Aortic valve  regurgitation is not visualized. Aortic valve sclerosis is present, with  no evidence of aortic valve stenosis.   5. The inferior vena cava is normal in size with greater than 50%  respiratory variability, suggesting right atrial pressure of 3 mmHg.   EKG 08/04/22 NSR, LAD, Possible LAFB     Neuro/Psych Seizures -, Well Controlled,  CVA  08/05/22 in setting of mini-strokes Residual gait disturbance and right sided weakness Dysphagia CVA, Residual Symptoms  negative psych ROS   GI/Hepatic Neg liver ROS,GERD  Medicated,,  Endo/Other    Renal/GU Renal InsufficiencyRenal disease  negative genitourinary   Musculoskeletal negative musculoskeletal ROS (+)    Abdominal   Peds  Hematology  (+) Blood dyscrasia, anemia   Anesthesia Other Findings   Reproductive/Obstetrics                              Anesthesia Physical Anesthesia Plan  ASA: 3  Anesthesia Plan: General   Post-op Pain Management: Minimal or no pain anticipated   Induction: Intravenous  PONV Risk Score and Plan: 2 and Treatment may vary due to age or medical condition, Ondansetron, Dexamethasone, TIVA and Propofol infusion  Airway Management Planned: Oral ETT  Additional Equipment: Arterial line  Intra-op Plan:   Post-operative Plan: Extubation in OR  Informed Consent: I have reviewed the patients History and Physical, chart, labs and discussed the procedure including the risks, benefits and alternatives for the proposed anesthesia with the patient or authorized representative who has indicated his/her understanding and acceptance.     Dental advisory given  Plan Discussed with: CRNA and Anesthesiologist  Anesthesia Plan Comments:          Anesthesia Quick Evaluation

## 2022-08-11 NOTE — Progress Notes (Signed)
Inpatient Rehabilitation Admissions Coordinator   I spoke with pt's daughter by phone to review estimated cost of care for a possible Cir admit if approved by payor. She is in agreement,. I await OR 2/1 and will follow up postoperatively.  Danne Baxter, RN, MSN Rehab Admissions Coordinator [phone] 08/11/2022 10:55 AM

## 2022-08-12 ENCOUNTER — Inpatient Hospital Stay (HOSPITAL_COMMUNITY): Payer: Medicare Other | Admitting: Anesthesiology

## 2022-08-12 ENCOUNTER — Other Ambulatory Visit: Payer: Self-pay

## 2022-08-12 ENCOUNTER — Encounter (HOSPITAL_COMMUNITY): Payer: Self-pay | Admitting: Family Medicine

## 2022-08-12 ENCOUNTER — Encounter (HOSPITAL_COMMUNITY)
Admission: EM | Disposition: A | Discharge status: Other Acute Inpatient Hospital | Payer: Self-pay | Source: Home / Self Care | Attending: Internal Medicine

## 2022-08-12 DIAGNOSIS — I10 Essential (primary) hypertension: Secondary | ICD-10-CM | POA: Diagnosis not present

## 2022-08-12 DIAGNOSIS — I639 Cerebral infarction, unspecified: Secondary | ICD-10-CM | POA: Diagnosis not present

## 2022-08-12 DIAGNOSIS — F17200 Nicotine dependence, unspecified, uncomplicated: Secondary | ICD-10-CM | POA: Diagnosis not present

## 2022-08-12 DIAGNOSIS — F17210 Nicotine dependence, cigarettes, uncomplicated: Secondary | ICD-10-CM

## 2022-08-12 DIAGNOSIS — N179 Acute kidney failure, unspecified: Secondary | ICD-10-CM | POA: Diagnosis not present

## 2022-08-12 DIAGNOSIS — D649 Anemia, unspecified: Secondary | ICD-10-CM

## 2022-08-12 DIAGNOSIS — I6522 Occlusion and stenosis of left carotid artery: Secondary | ICD-10-CM

## 2022-08-12 DIAGNOSIS — I63232 Cerebral infarction due to unspecified occlusion or stenosis of left carotid arteries: Secondary | ICD-10-CM

## 2022-08-12 HISTORY — PX: TRANSCAROTID ARTERY REVASCULARIZATIONÂ: SHX6778

## 2022-08-12 LAB — POCT ACTIVATED CLOTTING TIME: Activated Clotting Time: 266 seconds

## 2022-08-12 LAB — CBC
HCT: 26.5 % — ABNORMAL LOW (ref 39.0–52.0)
Hemoglobin: 8.2 g/dL — ABNORMAL LOW (ref 13.0–17.0)
MCH: 28.4 pg (ref 26.0–34.0)
MCHC: 30.9 g/dL (ref 30.0–36.0)
MCV: 91.7 fL (ref 80.0–100.0)
Platelets: 163 10*3/uL (ref 150–400)
RBC: 2.89 MIL/uL — ABNORMAL LOW (ref 4.22–5.81)
RDW: 12.3 % (ref 11.5–15.5)
WBC: 5.6 10*3/uL (ref 4.0–10.5)
nRBC: 0 % (ref 0.0–0.2)

## 2022-08-12 LAB — BASIC METABOLIC PANEL
Anion gap: 6 (ref 5–15)
BUN: 27 mg/dL — ABNORMAL HIGH (ref 8–23)
CO2: 21 mmol/L — ABNORMAL LOW (ref 22–32)
Calcium: 7.5 mg/dL — ABNORMAL LOW (ref 8.9–10.3)
Chloride: 100 mmol/L (ref 98–111)
Creatinine, Ser: 1.43 mg/dL — ABNORMAL HIGH (ref 0.61–1.24)
GFR, Estimated: 47 mL/min — ABNORMAL LOW (ref >60)
Glucose, Bld: 84 mg/dL (ref 70–99)
Potassium: 3.5 mmol/L (ref 3.5–5.1)
Sodium: 127 mmol/L — ABNORMAL LOW (ref 135–145)

## 2022-08-12 LAB — ABO/RH: ABO/RH(D): O POS

## 2022-08-12 LAB — PREPARE RBC (CROSSMATCH)

## 2022-08-12 SURGERY — TRANSCAROTID ARTERY REVASCULARIZATION (TCAR)
Anesthesia: General | Site: Neck | Laterality: Left

## 2022-08-12 MED ORDER — HEPARIN 6000 UNIT IRRIGATION SOLUTION
Status: DC | PRN
  Administered 2022-08-12: 1

## 2022-08-12 MED ORDER — LABETALOL HCL 5 MG/ML IV SOLN: 10.0000 mg | INTRAVENOUS | Status: DC | PRN

## 2022-08-12 MED ORDER — SODIUM CHLORIDE 0.9 % IV SOLN: INTRAVENOUS | Status: AC

## 2022-08-12 MED ORDER — IODIXANOL 320 MG/ML IV SOLN
INTRAVENOUS | Status: DC | PRN
  Administered 2022-08-12: 15 mL

## 2022-08-12 MED ORDER — ENOXAPARIN SODIUM 30 MG/0.3ML IJ SOSY
30.0000 mg | PREFILLED_SYRINGE | INTRAMUSCULAR | Status: DC
  Administered 2022-08-13 – 2022-08-14 (×2): 30 mg via SUBCUTANEOUS
  Filled 2022-08-12 (×2): qty 0.3

## 2022-08-12 MED ORDER — PHENYLEPHRINE 80 MCG/ML (10ML) SYRINGE FOR IV PUSH (FOR BLOOD PRESSURE SUPPORT)
PREFILLED_SYRINGE | INTRAVENOUS | Status: DC | PRN
  Administered 2022-08-12 (×2): 80 ug via INTRAVENOUS

## 2022-08-12 MED ORDER — PROTAMINE SULFATE 10 MG/ML IV SOLN
INTRAVENOUS | Status: AC
  Filled 2022-08-12: qty 5

## 2022-08-12 MED ORDER — EPHEDRINE SULFATE (PRESSORS) 50 MG/ML IJ SOLN
INTRAMUSCULAR | Status: DC | PRN
  Administered 2022-08-12: 5 mg via INTRAVENOUS

## 2022-08-12 MED ORDER — ONDANSETRON HCL 4 MG/2ML IJ SOLN
INTRAMUSCULAR | Status: DC | PRN
  Administered 2022-08-12: 4 mg via INTRAVENOUS

## 2022-08-12 MED ORDER — CEFAZOLIN SODIUM-DEXTROSE 2-4 GM/100ML-% IV SOLN
2.0000 g | Freq: Two times a day (BID) | INTRAVENOUS | Status: AC
  Administered 2022-08-12 – 2022-08-13 (×2): 2 g via INTRAVENOUS
  Filled 2022-08-12 (×2): qty 100

## 2022-08-12 MED ORDER — METOPROLOL TARTRATE 5 MG/5ML IV SOLN: 2.0000 mg | INTRAVENOUS | Status: DC | PRN

## 2022-08-12 MED ORDER — ROCURONIUM BROMIDE 10 MG/ML (PF) SYRINGE
PREFILLED_SYRINGE | INTRAVENOUS | Status: DC | PRN
  Administered 2022-08-12: 70 mg via INTRAVENOUS
  Administered 2022-08-12: 15 mg via INTRAVENOUS

## 2022-08-12 MED ORDER — GLYCOPYRROLATE PF 0.2 MG/ML IJ SOSY
PREFILLED_SYRINGE | INTRAMUSCULAR | Status: AC
  Filled 2022-08-12: qty 1

## 2022-08-12 MED ORDER — FENTANYL CITRATE (PF) 250 MCG/5ML IJ SOLN
INTRAMUSCULAR | Status: DC | PRN
  Administered 2022-08-12: 150 ug via INTRAVENOUS
  Administered 2022-08-12: 50 ug via INTRAVENOUS

## 2022-08-12 MED ORDER — POTASSIUM CHLORIDE CRYS ER 20 MEQ PO TBCR: 20.0000 meq | EXTENDED_RELEASE_TABLET | Freq: Every day | ORAL | Status: DC | PRN

## 2022-08-12 MED ORDER — GLYCOPYRROLATE 0.2 MG/ML IJ SOLN
INTRAMUSCULAR | Status: DC | PRN
  Administered 2022-08-12: .2 mg via INTRAVENOUS

## 2022-08-12 MED ORDER — LIDOCAINE HCL (PF) 1 % IJ SOLN
INTRAMUSCULAR | Status: AC
  Filled 2022-08-12: qty 30

## 2022-08-12 MED ORDER — 0.9 % SODIUM CHLORIDE (POUR BTL) OPTIME
TOPICAL | Status: DC | PRN
  Administered 2022-08-12: 1000 mL

## 2022-08-12 MED ORDER — FENTANYL CITRATE (PF) 100 MCG/2ML IJ SOLN: 25.0000 ug | INTRAMUSCULAR | Status: DC | PRN

## 2022-08-12 MED ORDER — PROTAMINE SULFATE 10 MG/ML IV SOLN
INTRAVENOUS | Status: DC | PRN
  Administered 2022-08-12: 50 mg via INTRAVENOUS

## 2022-08-12 MED ORDER — SODIUM CHLORIDE 0.9 % IV SOLN
0.1500 ug/kg/min | INTRAVENOUS | Status: DC
  Administered 2022-08-12: .15 ug/kg/min via INTRAVENOUS
  Filled 2022-08-12: qty 2000

## 2022-08-12 MED ORDER — PROPOFOL 10 MG/ML IV BOLUS
INTRAVENOUS | Status: AC
  Filled 2022-08-12: qty 20

## 2022-08-12 MED ORDER — SODIUM CHLORIDE 0.9 % IV SOLN: INTRAVENOUS | Status: DC

## 2022-08-12 MED ORDER — PROPOFOL 10 MG/ML IV BOLUS
INTRAVENOUS | Status: DC | PRN
  Administered 2022-08-12: 30 mg via INTRAVENOUS
  Administered 2022-08-12: 150 mg via INTRAVENOUS

## 2022-08-12 MED ORDER — ALUM & MAG HYDROXIDE-SIMETH 200-200-20 MG/5ML PO SUSP: 15.0000 mL | ORAL | Status: DC | PRN

## 2022-08-12 MED ORDER — LIDOCAINE 2% (20 MG/ML) 5 ML SYRINGE
INTRAMUSCULAR | Status: AC
  Filled 2022-08-12: qty 5

## 2022-08-12 MED ORDER — PHENYLEPHRINE HCL-NACL 20-0.9 MG/250ML-% IV SOLN
INTRAVENOUS | Status: DC | PRN
  Administered 2022-08-12: 20 ug/min via INTRAVENOUS

## 2022-08-12 MED ORDER — LABETALOL HCL 5 MG/ML IV SOLN
5.0000 mg | INTRAVENOUS | Status: AC | PRN
  Administered 2022-08-12 (×2): 5 mg via INTRAVENOUS

## 2022-08-12 MED ORDER — SODIUM CHLORIDE 0.9 % IV SOLN: 500.0000 mL | Freq: Once | INTRAVENOUS | Status: DC | PRN

## 2022-08-12 MED ORDER — SODIUM CHLORIDE 0.9% IV SOLUTION: Freq: Once | INTRAVENOUS | Status: DC

## 2022-08-12 MED ORDER — ONDANSETRON HCL 4 MG/2ML IJ SOLN: 4.0000 mg | Freq: Once | INTRAMUSCULAR | Status: DC | PRN

## 2022-08-12 MED ORDER — LABETALOL HCL 5 MG/ML IV SOLN
INTRAVENOUS | Status: AC
  Filled 2022-08-12: qty 4

## 2022-08-12 MED ORDER — ONDANSETRON HCL 4 MG/2ML IJ SOLN
INTRAMUSCULAR | Status: AC
  Filled 2022-08-12: qty 2

## 2022-08-12 MED ORDER — DEXAMETHASONE SODIUM PHOSPHATE 10 MG/ML IJ SOLN
INTRAMUSCULAR | Status: AC
  Filled 2022-08-12: qty 1

## 2022-08-12 MED ORDER — HYDRALAZINE HCL 20 MG/ML IJ SOLN: 5.0000 mg | INTRAMUSCULAR | Status: DC | PRN

## 2022-08-12 MED ORDER — LACTATED RINGERS IV SOLN: INTRAVENOUS | Status: DC | PRN

## 2022-08-12 MED ORDER — GUAIFENESIN-DM 100-10 MG/5ML PO SYRP: 15.0000 mL | ORAL_SOLUTION | ORAL | Status: DC | PRN

## 2022-08-12 MED ORDER — HEPARIN SODIUM (PORCINE) 1000 UNIT/ML IJ SOLN
INTRAMUSCULAR | Status: DC | PRN
  Administered 2022-08-12: 6000 [IU] via INTRAVENOUS

## 2022-08-12 MED ORDER — HEMOSTATIC AGENTS (NO CHARGE) OPTIME
TOPICAL | Status: DC | PRN
  Administered 2022-08-12: 1 via TOPICAL

## 2022-08-12 MED ORDER — STERILE WATER FOR IRRIGATION IR SOLN
Status: DC | PRN
  Administered 2022-08-12: 1000 mL

## 2022-08-12 MED ORDER — HEPARIN 6000 UNIT IRRIGATION SOLUTION
Status: AC
  Filled 2022-08-12: qty 500

## 2022-08-12 MED ORDER — ONDANSETRON HCL 4 MG/2ML IJ SOLN: 4.0000 mg | Freq: Four times a day (QID) | INTRAMUSCULAR | Status: DC | PRN

## 2022-08-12 MED ORDER — OXYCODONE HCL 5 MG PO TABS: 5.0000 mg | ORAL_TABLET | ORAL | Status: DC | PRN

## 2022-08-12 MED ORDER — MAGNESIUM SULFATE 2 GM/50ML IV SOLN: 2.0000 g | Freq: Every day | INTRAVENOUS | Status: DC | PRN

## 2022-08-12 MED ORDER — LIDOCAINE 2% (20 MG/ML) 5 ML SYRINGE
INTRAMUSCULAR | Status: DC | PRN
  Administered 2022-08-12: 60 mg via INTRAVENOUS

## 2022-08-12 MED ORDER — HYDROMORPHONE HCL 1 MG/ML IJ SOLN: 0.5000 mg | INTRAMUSCULAR | Status: DC | PRN

## 2022-08-12 MED ORDER — EPHEDRINE 5 MG/ML INJ
INTRAVENOUS | Status: AC
  Filled 2022-08-12: qty 5

## 2022-08-12 MED ORDER — FENTANYL CITRATE (PF) 250 MCG/5ML IJ SOLN
INTRAMUSCULAR | Status: AC
  Filled 2022-08-12: qty 5

## 2022-08-12 MED ORDER — CEFAZOLIN SODIUM-DEXTROSE 2-4 GM/100ML-% IV SOLN
INTRAVENOUS | Status: AC
  Filled 2022-08-12: qty 100

## 2022-08-12 MED ORDER — PHENOL 1.4 % MT LIQD: 1.0000 | OROMUCOSAL | Status: DC | PRN

## 2022-08-12 MED ORDER — DEXAMETHASONE SODIUM PHOSPHATE 10 MG/ML IJ SOLN
INTRAMUSCULAR | Status: DC | PRN
  Administered 2022-08-12: 8 mg via INTRAVENOUS

## 2022-08-12 MED ORDER — SUGAMMADEX SODIUM 200 MG/2ML IV SOLN
INTRAVENOUS | Status: DC | PRN
  Administered 2022-08-12: 200 mg via INTRAVENOUS

## 2022-08-12 MED ORDER — ROCURONIUM BROMIDE 10 MG/ML (PF) SYRINGE
PREFILLED_SYRINGE | INTRAVENOUS | Status: AC
  Filled 2022-08-12: qty 10

## 2022-08-12 MED ORDER — PROPOFOL 500 MG/50ML IV EMUL
INTRAVENOUS | Status: DC | PRN
  Administered 2022-08-12: 125 ug/kg/min via INTRAVENOUS

## 2022-08-12 SURGICAL SUPPLY — 50 items
ADH SKN CLS APL DERMABOND .7 (GAUZE/BANDAGES/DRESSINGS) ×2
BAG BANDED W/RUBBER/TAPE 36X54 (MISCELLANEOUS) ×1 IMPLANT
BAG COUNTER SPONGE SURGICOUNT (BAG) ×1 IMPLANT
BAG EQP BAND 135X91 W/RBR TAPE (MISCELLANEOUS) ×1
BAG SPNG CNTER NS LX DISP (BAG) ×1
CANISTER SUCT 3000ML PPV (MISCELLANEOUS) ×1 IMPLANT
CATH BALLN ENROUTE 5X35 (CATHETERS) IMPLANT
CATH BEACON 5 .035 40 KMP TP (CATHETERS) IMPLANT
CATH BEACON 5 .038 40 KMP TP (CATHETERS) ×1
CLIP LIGATING EXTRA MED SLVR (CLIP) ×1 IMPLANT
CLIP LIGATING EXTRA SM BLUE (MISCELLANEOUS) ×1 IMPLANT
COVER DOME SNAP 22 D (MISCELLANEOUS) ×1 IMPLANT
COVER PROBE W GEL 5X96 (DRAPES) ×1 IMPLANT
DERMABOND ADVANCED .7 DNX12 (GAUZE/BANDAGES/DRESSINGS) ×1 IMPLANT
DRAPE FEMORAL ANGIO 80X135IN (DRAPES) ×1 IMPLANT
ELECT REM PT RETURN 9FT ADLT (ELECTROSURGICAL) ×1
ELECTRODE REM PT RTRN 9FT ADLT (ELECTROSURGICAL) ×1 IMPLANT
GLOVE BIO SURGEON STRL SZ7.5 (GLOVE) ×1 IMPLANT
GOWN STRL REUS W/ TWL LRG LVL3 (GOWN DISPOSABLE) ×2 IMPLANT
GOWN STRL REUS W/ TWL XL LVL3 (GOWN DISPOSABLE) ×1 IMPLANT
GOWN STRL REUS W/TWL LRG LVL3 (GOWN DISPOSABLE) ×2
GOWN STRL REUS W/TWL XL LVL3 (GOWN DISPOSABLE) ×1
GUIDEWIRE ENROUTE 0.014 (WIRE) ×1 IMPLANT
INTRODUCER KIT GALT 7CM (INTRODUCER) ×1
KIT BASIN OR (CUSTOM PROCEDURE TRAY) ×1 IMPLANT
KIT ENCORE 26 ADVANTAGE (KITS) ×1 IMPLANT
KIT INTRODUCER GALT 7 (INTRODUCER) ×1 IMPLANT
KIT TURNOVER KIT B (KITS) ×1 IMPLANT
NDL HYPO 25GX1X1/2 BEV (NEEDLE) IMPLANT
NEEDLE HYPO 25GX1X1/2 BEV (NEEDLE) IMPLANT
PACK CAROTID (CUSTOM PROCEDURE TRAY) ×1 IMPLANT
POSITIONER HEAD DONUT 9IN (MISCELLANEOUS) ×1 IMPLANT
POWDER SURGICEL 3.0 GRAM (HEMOSTASIS) IMPLANT
SET MICROPUNCTURE 5F STIFF (MISCELLANEOUS) ×1 IMPLANT
STENT TRANSCAROTID SYS 10X40 (Permanent Stent) IMPLANT
SUT MNCRL AB 4-0 PS2 18 (SUTURE) ×1 IMPLANT
SUT PROLENE 5 0 C 1 24 (SUTURE) ×1 IMPLANT
SUT SILK 2 0 PERMA HAND 18 BK (SUTURE) ×1 IMPLANT
SUT SILK 3 0 (SUTURE)
SUT SILK 3-0 18XBRD TIE 12 (SUTURE) IMPLANT
SUT VIC AB 3-0 SH 27 (SUTURE) ×1
SUT VIC AB 3-0 SH 27X BRD (SUTURE) ×1 IMPLANT
SYR 10ML LL (SYRINGE) ×3 IMPLANT
SYR 20ML LL LF (SYRINGE) ×1 IMPLANT
SYR CONTROL 10ML LL (SYRINGE) IMPLANT
SYSTEM TRANSCAROTID NEUROPRTCT (MISCELLANEOUS) ×1 IMPLANT
TOWEL GREEN STERILE (TOWEL DISPOSABLE) ×1 IMPLANT
TRANSCAROTID NEUROPROTECT SYS (MISCELLANEOUS) ×1
WATER STERILE IRR 1000ML POUR (IV SOLUTION) ×1 IMPLANT
WIRE BENTSON .035X145CM (WIRE) ×1 IMPLANT

## 2022-08-12 NOTE — Anesthesia Procedure Notes (Signed)
Procedure Name: Intubation Date/Time: 08/12/2022 7:50 AM  Performed by: Inda Coke, CRNAPre-anesthesia Checklist: Patient identified, Emergency Drugs available, Suction available, Timeout performed and Patient being monitored Patient Re-evaluated:Patient Re-evaluated prior to induction Oxygen Delivery Method: Circle system utilized Preoxygenation: Pre-oxygenation with 100% oxygen Induction Type: IV induction Ventilation: Mask ventilation without difficulty Laryngoscope Size: Mac and 4 Grade View: Grade I Tube type: Oral Number of attempts: 1 Airway Equipment and Method: Stylet Placement Confirmation: ETT inserted through vocal cords under direct vision, positive ETCO2, CO2 detector and breath sounds checked- equal and bilateral Secured at: 22 cm Tube secured with: Tape Dental Injury: Teeth and Oropharynx as per pre-operative assessment

## 2022-08-12 NOTE — Progress Notes (Signed)
Inpatient Rehabilitation Admissions Coordinator   [facility] medicare has denied request for CIR admit determination when medically ready postoperatively, as patient is in surgery today. I have to resubmit Auth postoperatively.I will follow up on 2/2.  Danne Baxter, RN, MSN Rehab Admissions Coordinator [phone] 08/12/2022 8:35 AM

## 2022-08-12 NOTE — Progress Notes (Signed)
PHARMACIST LIPID MONITORING  Carlos Shields is a 89 y.o. male admitted on 08/05/2022 with ischemic stroke with multiple small acute infarcts suggestive of embolic phenomenon. Pt found to have L ICA stenosis and underwent TCAR of L ICA on 08/12/22.  Pharmacy has been consulted to optimize lipid-lowering therapy with the indication of secondary prevention for clinical ASCVD.  Recent Labs:  Lipid Panel (last 6 months):   Lab Results  Component Value Date   CHOL 143 08/06/2022   TRIG 27 08/06/2022   HDL 69 08/06/2022   CHOLHDL 2.1 08/06/2022   VLDL 5 08/06/2022   LDLCALC 69 08/06/2022    Hepatic function panel (last 6 months):   Lab Results  Component Value Date   AST 29 08/05/2022   ALT 13 08/05/2022   ALKPHOS 66 08/05/2022   BILITOT 1.0 08/05/2022    SCr (since admission):   Serum creatinine: 1.43 mg/dL (H) 08/12/22 0344 Estimated creatinine clearance: 25.6 mL/min (A)  Current therapy and lipid therapy tolerance Current lipid-lowering therapy: rosuvastatin 10mg  daily (started inpatient on 08/09/22 by vascular surgery) Previous lipid-lowering therapies (if applicable): none Documented or reported allergies or intolerances to lipid-lowering therapies (if applicable): N/A  Assessment:   Lipid panel on 08/06/22 with LDL of 69 without any lipid-lowering therapy prior to admission. Patient was initiated on rosuvastatin 10mg  daily on 08/09/22.  Plan:    1.Statin intensity (high intensity recommended for all patients regardless of the LDL):  No statin changes. The patient is already on a high intensity statin. Rosuvastatin 10mg  daily is the maximum recommended dose for patients with CrCl <75ml/min and is considered high-intensity based on patient's renal function.  2.Add ezetimibe (if any one of the following):   Not indicated at this time.  3.Refer to lipid clinic:   No  4.Follow-up with:  Primary care provider - [name], PA  5.Follow-up labs after discharge:  Changes in lipid  therapy were made. Check a lipid panel in 8-12 weeks then annually.     Luisa Hart, PharmD, BCPS Clinical Pharmacist 08/12/2022 2:18 PM   Please refer to AMION for pharmacy phone number;a

## 2022-08-12 NOTE — Anesthesia Procedure Notes (Signed)
Arterial Line Insertion Start/End2/07/2022 6:55 AM, 08/12/2022 7:00 AM Performed by: Inda Coke, CRNA, CRNA  Patient location: Pre-op. Preanesthetic checklist: patient identified, IV checked, site marked, risks and benefits discussed, surgical consent, monitors and equipment checked, pre-op evaluation, timeout performed and anesthesia consent Lidocaine 1% used for infiltration Right was placed Catheter size: 20 G Hand hygiene performed  and maximum sterile barriers used  Allen's test indicative of satisfactory collateral circulation Procedure performed without using ultrasound guided technique. Following insertion, dressing applied and Biopatch. Post procedure assessment: normal  Patient tolerated the procedure well with no immediate complications.

## 2022-08-12 NOTE — Progress Notes (Signed)
  Progress Note    08/12/2022 7:18 AM Day of Surgery  Subjective: No new issues  Vitals:   08/11/22 2320 08/12/22 0348  BP: (!) 138/91 (!) 162/74  Pulse: 60 (!) 59  Resp: 17 16  Temp: 98 F (36.7 C) 97.6 F (36.4 C)  SpO2: 99% 100%    Physical Exam: Awake and alert Moving all 4 extremities without limitation  CBC    Component Value Date/Time   WBC 5.6 08/12/2022 0344   RBC 2.89 (L) 08/12/2022 0344   HGB 8.2 (L) 08/12/2022 0344   HCT 26.5 (L) 08/12/2022 0344   PLT 163 08/12/2022 0344   MCV 91.7 08/12/2022 0344   MCH 28.4 08/12/2022 0344   MCHC 30.9 08/12/2022 0344   RDW 12.3 08/12/2022 0344   LYMPHSABS 2.3 08/05/2022 1428   MONOABS 0.8 08/05/2022 1428   EOSABS 0.0 08/05/2022 1428   BASOSABS 0.1 08/05/2022 1428    BMET    Component Value Date/Time   NA 127 (L) 08/12/2022 0344   K 3.5 08/12/2022 0344   CL 100 08/12/2022 0344   CO2 21 (L) 08/12/2022 0344   GLUCOSE 84 08/12/2022 0344   BUN 27 (H) 08/12/2022 0344   CREATININE 1.43 (H) 08/12/2022 0344   CALCIUM 7.5 (L) 08/12/2022 0344   GFRNONAA 47 (L) 08/12/2022 0344   GFRAA 51 (L) 11/19/2019 2118    INR    Component Value Date/Time   INR 1.3 (H) 08/05/2022 1428     Intake/Output Summary (Last 24 hours) at 08/12/2022 0718 Last data filed at 08/11/2022 1900 Gross per 24 hour  Intake 995.89 ml  Output 400 ml  Net 595.89 ml   CTA IMPRESSION: 1. Moderate to advanced atheromatous change about the carotid bifurcations/proximal ICAs with associated stenoses of up to 65% on the right and 75% on the left. 2. 50% proximal left subclavian artery stenosis. 3. Wide patency of both vertebral arteries within the neck.  MRI Brain IMPRESSION: 1. Multiple small acute infarcts involving the left basal ganglia, left frontal, parietal and occipital cortex and adjacent white matter. Slight edema without mass effect. 2. Advanced brain atrophy (ICD10-G31.9). and chronic microvascular ischemic  disease.  Assessment/plan:  89 y.o. male is here with left hemispheric CVA has mostly recovered from a symptom standpoint.  High-grade left ICA stenosis with soft and calcified plaque.  Plan for left transcarotid artery stent today.  Again reiterated the risk and benefits with patient and family at bedside.  Consent signed.   Carlos Shields C. Donzetta Matters, MD [facility] Office: [phone] Pager: [phone]  08/12/2022 7:18 AM

## 2022-08-12 NOTE — Progress Notes (Signed)
Mobility Specialist Progress Note:   08/12/22 1639  Mobility  Activity Stood at bedside;Ambulated with assistance to bathroom  Level of Assistance Moderate assist, patient does 50-74%  Assistive Device Front wheel walker  Distance Ambulated (ft) 2 ft  Activity Response Tolerated well  $Mobility charge 1 Mobility   Pt in bed agreeable to mobility. No complaints of pain. MinA for bed mobility. ModA to stand and cues to remain forward. Pt with posterior lean and unable to take step away from EOB. Able to take a could side steps to move toward [facility]. MinA to get back into bed. Left in bed with call bell in reach and all needs met.   Gareth Eagle Porsha Skilton Mobility Specialist Please contact via [employer] or  Rehab Office at [phone]

## 2022-08-12 NOTE — Progress Notes (Signed)
Patient arrived at the unit,CHG bath given,CCMD notified,vitals taken .oozing present  on rt groin,patient oriented to the unit

## 2022-08-12 NOTE — Progress Notes (Signed)
Transporter here to transport pt to OR. 1 daughter at bedside. IVF Dc'd.

## 2022-08-12 NOTE — Progress Notes (Signed)
Pt left unit per bed for OR. Pt left in stable condition. Attempted to call CRNAs Rejeana Brock and Luane School both not available. Will try later

## 2022-08-12 NOTE — Op Note (Signed)
Patient name: Mikeal Winstanley MRN: [id number] DOB: April 16, 1931 Sex: male  08/12/2022 Pre-operative Diagnosis: Symptomatic left ICA stenosis Post-operative diagnosis:  Same Surgeon:  Erlene Quan C. Donzetta Matters, MD Assistant: Leontine Locket, PA procedure Performed: 1.  Ultrasound-guided cannulation right common femoral vein for placement of 8 French flow reversal sheath 2.  Transcatheter placement of left internal carotid artery 10 x 40 mm EnRoute stent with flow reversal neuro protection   Indications: 89 year old male without significant vascular history recently admitted with left hemispheric stroke found to have high-grade stenosis of the left ICA.  He has been started on aspirin, Plavix and statin and he is now indicated for stent.  Findings: The ICA was 90% stenosed for approximately 2 cm.  After stenting this was reduced to 0% and in 2 views there was no stenosis appreciated.  Patient was neurologically intact upon awakening from anesthesia.   Procedure:  The patient was identified in the holding area and taken to the operating room where is placed supine operative when general anesthesia was induced.  He was gently prepped draped in the left neck and bilateral groins in usual fashion, antibiotics were administered and a timeout was called.  We began with ultrasound-guided cannulation of the right common femoral vein this was done with a micropuncture needle followed by wire and sheath.  A Bentson wire was placed followed by the 8 [place] flow reversal sheath and this was sutured to the skin to hold it in place.  A vertical incision was then made between the 2 heads of the sternocleidomastoid we dissected down identify the IJ and the vagus nerve and these were retracted out of the way and the common carotid artery was encircled with vessel loop and umbilical tape and the patient was fully heparinized and ACT returned greater than 260.  We then placed a U-stitch 5-0 Prolene suture in the common carotid artery and  this was cannulated with a micropuncture needle followed by wire to 3 cm and sheath to 3 cm.  Angiography was performed which identified the common carotid bifurcation and a J-wire was placed at this level followed by the flow reversal sheath and then this was sutured to the skin in 2 places and flow reversal was initiated and confirmed.  After the ACT was confirmed as well as systolic blood pressure greater than 140 we then clamped the common carotid artery.  An 014 wire and Kumpe catheter were used to cross the stenosed internal carotid artery.  This was then primarily balloon dilated with 6 mm balloon and then primarily stented with 10 x 40 mm stent.  After stenting we did roll the C arm and identified the stent with fluoroscopic guidance to have no kinks or cramps.  After 2 minutes of washout we then performed angiography in 2 views which demonstrated no residual stenosis.  Satisfied with this the wire was removed and the clamp was also removed.  Flow reversal was discontinued.  The sheath was removed and the 5-0 Prolene suture was cinched.  We then removed the sheath in the right groin and pressure was held until hemostasis obtained.  Doppler demonstrated brisk flow throughout the common carotid artery distal to our cannulation site.  50 mg of protamine was administered.  We obtained hemostasis and irrigated the neck wound and closed the platysma with Vicryl and the skin with Monocryl.  Dermabond was placed in both the groin incision and the neck.  Patient was then awakened from anesthesia having tolerated procedure without immediate complication.  He was neurologically intact upon awakening and was transferred to recovery in stable condition.  All counts were correct at completion.  EBL: 25cc  Contrast: 15cc  Ileigh Mettler C. Donzetta Matters, MD [facility] [place] Office: [phone] Pager: [phone]

## 2022-08-12 NOTE — Progress Notes (Signed)
  Day of Surgery Note    Subjective:  awake in recovery but a little slow to wake.    Vitals:   08/12/22 0348 08/12/22 0930  BP: (!) 162/74 129/73  Pulse: (!) 59 74  Resp: 16 (!) 7  Temp: 97.6 F (36.4 C) (!) 97.5 F (36.4 C)  SpO2: 100% 100%    Incisions:   left neck incision looks fine; right groin soft without hematoma Extremities:  moving all extremities equally Cardiac:  regular Lungs:  non labored    Assessment/Plan:  This is a 89 y.o. male who is s/p  Left TCAR  -pt moving all extremities in recovery -continue asa/statin/plavix -IVF @ 50cc/hr -to [place] when bed available.   Leontine Locket, PA-C 08/12/2022 9:46 AM [phone]

## 2022-08-12 NOTE — Anesthesia Postprocedure Evaluation (Signed)
Anesthesia Post Note  Patient: [name]  Procedure(s) Performed: Transcarotid Artery Revascularization (Left: Neck)     Patient location during evaluation: PACU Anesthesia Type: General Level of consciousness: awake and alert Pain management: pain level controlled Vital Signs Assessment: post-procedure vital signs reviewed and stable Respiratory status: spontaneous breathing, nonlabored ventilation, respiratory function stable and patient connected to nasal cannula oxygen Cardiovascular status: blood pressure returned to baseline and stable Postop Assessment: no apparent nausea or vomiting Anesthetic complications: no   No notable events documented.  Last Vitals:  Vitals:   08/12/22 1000 08/12/22 1015  BP: (!) 145/81 (!) 159/92  Pulse: 89 93  Resp: 12 17  Temp:    SpO2: 96% 97%    Last Pain:  Vitals:   08/12/22 1000  TempSrc:   PainSc: 0-No pain                 Trever Streater A.

## 2022-08-12 NOTE — Transfer of Care (Signed)
Immediate Anesthesia Transfer of Care Note  Patient: Carlos Shields  Procedure(s) Performed: Transcarotid Artery Revascularization (Left: Neck)  Patient Location: PACU  Anesthesia Type:General  Level of Consciousness: awake and alert   Airway & Oxygen Therapy: Patient Spontanous Breathing and Patient connected to face mask oxygen  Post-op Assessment: Report given to RN, Post -op Vital signs reviewed and stable, Patient moving all extremities X 4, and Patient able to stick tongue midline  Post vital signs: Reviewed and stable  Last Vitals:  Vitals Value Taken Time  BP 129/73 08/12/22 0930  Temp    Pulse 81 08/12/22 0937  Resp 10 08/12/22 0937  SpO2 97 % 08/12/22 0937  Vitals shown include unvalidated device data.  Last Pain:  Vitals:   08/12/22 0348  TempSrc: Oral  PainSc:          Complications: No notable events documented.

## 2022-08-12 NOTE — Progress Notes (Signed)
Attempted to contact Rejeana Brock CRNA and Luane School CRNA both not available

## 2022-08-12 NOTE — TOC Initial Note (Addendum)
Transition of Care [facility]) - Initial/Assessment Note    Patient Details  Name: Carlos Shields MRN: [id number] Date of Birth: 08-20-1930  Transition of [facility]) CM/SW Contact:    Verdell Carmine, RN Phone Number: 08/12/2022, 2:27 PM  Clinical Narrative:                 89 year old patient lives with adult children. He has seizure history. His son passed away last week, he did not take ASA and Keppra  for a few days. Presents with CVA. Outside window for TpA.  He normally ambulates with a walker at home. PT and OT consult for recommendations. Had a embolic event requiring stent of IVC.  CIR had reviewed patient, however he head to go for procedure. They will follow up 2/2 for readiness and possible authorization  CM will follow for needs, recommendations, and transitions of care  Expected Discharge Plan: [place] Barriers to Discharge: Continued Medical Work up   Patient Goals and CMS Choice            Expected Discharge Plan and Services       Living arrangements for the past 2 months: Single Family Home                                      Prior Living Arrangements/Services Living arrangements for the past 2 months: Single Family Home Lives with:: Adult Children Patient language and need for interpreter reviewed:: Yes        Need for Family Participation in Patient Care: Yes (Comment) Care giver support system in place?: Yes (comment) Current home services: DME Criminal Activity/Legal Involvement Pertinent to Current Situation/Hospitalization: No - Comment as needed  Activities of Daily Living Home Assistive Devices/Equipment: Walker (specify type) ADL Screening (condition at time of admission) Patient's cognitive ability adequate to safely complete daily activities?: Yes Is the patient deaf or have difficulty hearing?: No Does the patient have difficulty seeing, even when wearing glasses/contacts?: No Does the patient have difficulty  concentrating, remembering, or making decisions?: No Patient able to express need for assistance with ADLs?: Yes Does the patient have difficulty dressing or bathing?: Yes Independently performs ADLs?: No Communication: Independent Dressing (OT): Needs assistance Is this a change from baseline?: Pre-admission baseline Grooming: Needs assistance Is this a change from baseline?: Pre-admission baseline Feeding: Independent Bathing: Needs assistance Is this a change from baseline?: Pre-admission baseline Toileting: Needs assistance Is this a change from baseline?: Pre-admission baseline In/Out Bed: Needs assistance Is this a change from baseline?: Pre-admission baseline Walks in Home: Needs assistance Is this a change from baseline?: Change from baseline, expected to last >3 days Does the patient have difficulty walking or climbing stairs?: Yes Weakness of Legs: None Weakness of Arms/Hands: None  Permission Sought/Granted                  Emotional Assessment       Orientation: : Oriented to Self Alcohol / Substance Use: Not Applicable Psych Involvement: No (comment)  Admission diagnosis:  CVA (cerebral vascular accident) [facility]) [I63.9] Cerebrovascular accident (CVA), unspecified mechanism ([place]) [I63.9] Patient Active Problem List   Diagnosis Date Noted   Acute-on-chronic kidney injury ([place]) 08/05/2022   CVA (cerebral vascular accident) ([place]) 08/05/2022   Dysphagia 08/05/2022   Abnormal TSH 96/22/2979   Acute metabolic encephalopathy 89/21/1941   CKD (chronic kidney disease), stage III ([place]) 04/26/2021  Weakness generalized 04/26/2021   Weakness 04/25/2021   Syncope 01/13/2012   Anemia 12/09/2011   Seizures ([place]) 12/08/2011   HTN (hypertension) 12/08/2011   Encephalopathy 12/08/2011   PCP:  [name], PA Pharmacy:   [id number] - [address], [place] [address] Phone:  [phone] Fax: [phone]     Social Determinants of Health (SDOH) Social History: SDOH Screenings   Food Insecurity: No Food Insecurity (08/06/2022)  Housing: Low Risk  (08/06/2022)  Transportation Needs: No Transportation Needs (08/06/2022)  Utilities: Not At Risk (08/06/2022)  Tobacco Use: High Risk (08/12/2022)   SDOH Interventions:     Readmission Risk Interventions     No data to display

## 2022-08-12 NOTE — Progress Notes (Signed)
Report given to Rejeana Brock CRNA

## 2022-08-12 NOTE — Progress Notes (Signed)
[facility]                                                                              Daxson Reffett, is a 89 y.o. male, DOB - 1931/03/30, KDX:[id number] Admit date - 08/05/2022    Outpatient Primary MD for the patient is [name], PA  LOS - 6  days  Chief Complaint  Patient presents with   Code Stroke   Seizures       Brief summary   Patient is a 89 year old male with history of seizure on antiepileptics, CVA, hypertension, CKD stage IIIa, dysphagia who presented with right-sided weakness.  Patient's daughter reported that normally ambulates with a walker.  On the day of admission, around 1 to 2 PM, he wet his bed which was unusual for him, she saw him falling out of the bed and seemed to be dragging his leg.  Right hand appeared twisted so the daughter thought that he was having a seizure and called EMS.  He had missed few doses of his Keppra due to issues with pharmacy and ran out of aspirin for a week.  Patient's son also had died the previous week. CT head was negative.  No tPA or thrombectomy as he was outside window.  His MRI significant for acute CVA, workup significant for carotid artery stenosis, vascular surgery consulted, and plan for carotid artery surgery.   Assessment & Plan    Acute CVA (cerebral vascular accident) ([place]) with prior history of CVA -MRI brain showed multiple small acute infarct involving the left basal ganglia, left frontal, right occipital cortex and adjacent white matter.  Slight edema without mass effect.  Distribution of stroke appears to be embolic but no prior history of A-fib. - MRA Head Negative intracranial MRA for large vessel occlusion.significant for Intracranial atherosclerotic disease with associated moderate stenosis at the left carotid siphon, with additional severe distal left P3 stenosis. No other proximal high-grade or correctable stenosis. -Lipid panel showed cholesterol 143, LDL 69. -PT/OT consulted,  recommendation for CIR -He is on aspirin, and plavix -See discussion below under carotid artery stenosis. Patient has been stable from neurological standpoint.  Left ICA stenosis Patient underwent TCAR this morning.  Management per vascular surgery.  Seems to be stable.  Seizures ([place]) -Due to concern for possible breakthrough seizure, patient received additional 1 g IV Keppra in ED -Continue Keppra 500 mg twice daily -Seizure precautions Stable  Acute on chronic CKD, stage IIIa/hyponatremia Baseline around 1.2, peaked at 2.1. Creatinine noted to be 1.8 yesterday.  He was given IV fluids with improvement in renal function.  Sodium level however noted to be low.  Will change him to normal saline for the rest of the day today and recheck labs tomorrow. -Renal ultrasound significant for chronic medical disease, no hydronephrosis  Essential hypertension Initially allowed permissive hypertension.  Now on metoprolol.  Monitor blood pressures closely.    Chronic normocytic anemia -Hemoglobin baseline 9-10 Drop in hemoglobin today is likely dilutional.  No evidence of overt bleeding.  Defer transfusion to surgery team.  Abnormal TSH -TSH 4.9  Acute metabolic encephalopathy -Likely due to acute CVA, and  hospital delirium as well.   Code Status: Full CODE STATUS DVT Prophylaxis:  enoxaparin (LOVENOX) injection 30 mg Start: 08/05/22 2200 Family Communication: Discussed with daughter who was at the bedside Disposition Plan:   CIR when medically stable   Procedures:  MRI brain, MRA  Consultants:   Neurology Vascular surgery  Antimicrobials: None   Medications  sodium chloride   Intravenous Once   [MAR Hold] aspirin EC  81 mg Oral Daily   [MAR Hold] clopidogrel  75 mg Oral Daily   [MAR Hold] enoxaparin (LOVENOX) injection  30 mg Subcutaneous Q24H   [MAR Hold] levETIRAcetam  500 mg Oral BID   [MAR Hold] metoprolol tartrate  12.5 mg Oral BID   [MAR Hold] pantoprazole  40 mg  Oral Daily   [MAR Hold] rosuvastatin  10 mg Oral Daily      Subjective:   Patient seen in the PACU.  Following commands.  Denies any pain.  Discussed with nursing staff.   Objective:   Vitals:   08/12/22 0930 08/12/22 0945 08/12/22 1000 08/12/22 1015  BP: 129/73 125/88 (!) 145/81 (!) 159/92  Pulse: 74 87 89 93  Resp: 12 11 12 17   Temp: (!) 97.5 F (36.4 C)     TempSrc:      SpO2: 100% 97% 96% 97%  Weight:      Height:        Intake/Output Summary (Last 24 hours) at 08/12/2022 1029 Last data filed at 08/12/2022 0902 Gross per 24 hour  Intake 1755.89 ml  Output 520 ml  Net 1235.89 ml      Wt Readings from Last 3 Encounters:  08/05/22 51.7 kg  06/16/22 51.7 kg  04/25/21 54.4 kg     Exam  General appearance: Awake alert.  In no distress Resp: Clear to auscultation bilaterally.  Normal effort Cardio: S1-S2 is normal regular.  No S3-S4.  No rubs murmurs or bruit GI: Abdomen is soft.  Nontender nondistended.  Bowel sounds are present normal.  No masses organomegaly Extremities: No edema.  Moving all of his extremities      Data:   CBC Lab Results  Component Value Date   WBC 5.6 08/12/2022   RBC 2.89 (L) 08/12/2022   HGB 8.2 (L) 08/12/2022   HCT 26.5 (L) 08/12/2022   MCV 91.7 08/12/2022   MCH 28.4 08/12/2022   PLT 163 08/12/2022   MCHC 30.9 08/12/2022   RDW 12.3 08/12/2022   LYMPHSABS 2.3 08/05/2022   MONOABS 0.8 08/05/2022   EOSABS 0.0 08/05/2022   BASOSABS 0.1 73/22/0254     Last metabolic panel Lab Results  Component Value Date   NA 127 (L) 08/12/2022   K 3.5 08/12/2022   CL 100 08/12/2022   CO2 21 (L) 08/12/2022   BUN 27 (H) 08/12/2022   CREATININE 1.43 (H) 08/12/2022   GLUCOSE 84 08/12/2022   GFRNONAA 47 (L) 08/12/2022   GFRAA 51 (L) 11/19/2019   CALCIUM 7.5 (L) 08/12/2022   PHOS 2.9 05/04/2013   PROT 7.8 08/05/2022   ALBUMIN 4.3 08/05/2022   BILITOT 1.0 08/05/2022   ALKPHOS 66 08/05/2022   AST 29 08/05/2022   ALT 13 08/05/2022    ANIONGAP 6 08/12/2022     Coagulation Profile: Recent Labs  Lab 08/05/22 1428  INR 1.3*      Bonnielee Haff M.D. [facility] 08/12/2022, 10:29 AM

## 2022-08-12 NOTE — Discharge Instructions (Signed)
   [facility]   Discharge Instructions   Carotid Surgery  Please refer to the following instructions for your post-procedure care. Your surgeon or physician assistant will discuss any changes with you.  Activity  You are encouraged to walk as much as you can. You can slowly return to normal activities but must avoid strenuous activity and heavy lifting until your doctor tell you it's okay. Avoid activities such as vacuuming or swinging a golf club. You can drive after one week if you are comfortable and you are no longer taking prescription pain medications. It is normal to feel tired for serval weeks after your surgery. It is also normal to have difficulty with sleep habits, eating, and bowel movements after surgery. These will go away with time.  Bathing/Showering  Shower daily after you go home. Do not soak in a bathtub, hot tub, or swim until the incision heals completely.  Incision Care  Shower every day. Clean your incision with mild soap and water. Pat the area dry with a clean towel. You do not need a bandage unless otherwise instructed. Do not apply any ointments or creams to your incision. You may have skin glue on your incision. Do not peel it off. It will come off on its own in about one week. Your incision may feel thickened and raised for several weeks after your surgery. This is normal and the skin will soften over time.   For Men Only: It's okay to shave around the incision but do not shave the incision itself for 2 weeks. It is common to have numbness under your chin that could last for several months.  Diet  Resume your normal diet. There are no special food restrictions following this procedure. A low fat/low cholesterol diet is recommended for all patients with vascular disease. In order to heal from your surgery, it is CRITICAL to get adequate nutrition. Your body requires vitamins, minerals, and protein. Vegetables are the best source of  vitamins and minerals. Vegetables also provide the perfect balance of protein. Processed food has little nutritional value, so try to avoid this.  Medications  Resume taking all of your medications unless your doctor or physician assistant tells you not to. If your incision is causing pain, you may take over-the- counter pain relievers such as acetaminophen (Tylenol). If you were prescribed a stronger pain medication, please be aware these medications can cause nausea and constipation. Prevent nausea by taking the medication with a snack or meal. Avoid constipation by drinking plenty of fluids and eating foods with a high amount of fiber, such as fruits, vegetables, and grains.   Do not take Tylenol if you are taking prescription pain medications.  Follow Up  Our office will schedule a follow up appointment 2-3 weeks following discharge.  Please call us immediately for any of the following conditions  . Increased pain, redness, drainage (pus) from your incision site. . Fever of 101 degrees or higher. . If you should develop stroke (slurred speech, difficulty swallowing, weakness on one side of your body, loss of vision) you should call 911 and go to the nearest emergency room. .  Reduce your risk of vascular disease:  . Stop smoking. If you would like help call [facility] at 1-800-QUIT-NOW (1-800-784-8669) or  at 336-586-4000. . Manage your cholesterol . Maintain a desired weight . Control your diabetes . Keep your blood pressure down .  If you have any questions, please call the office at [phone]. 

## 2022-08-12 NOTE — Progress Notes (Signed)
  Day of Surgery Note    Subjective:  resting comfortably in his room   Vitals:   08/12/22 1430 08/12/22 1500  BP:    Pulse: 89 89  Resp:    Temp:    SpO2: 92% 94%    Incisions:   left neck incision is clean and dry; right groin soft without hematoma Extremities:  moving all extremities equally Lungs:  non labored    Assessment/Plan:  This is a 89 y.o. male who is s/p  Left TCAR  -called by RN earlier in the day about oozing from right groin.  Instructed her to hold pressure for 15-20 minutes.  Checked on pt and oozing from right groin has stopped and no hematoma present.   -pt neuro in tact -continue asa/statin/plavix   Leontine Locket, PA-C 08/12/2022 4:01 PM [phone]

## 2022-08-13 ENCOUNTER — Encounter (HOSPITAL_COMMUNITY): Payer: Self-pay | Admitting: Vascular Surgery

## 2022-08-13 DIAGNOSIS — N179 Acute kidney failure, unspecified: Secondary | ICD-10-CM | POA: Diagnosis not present

## 2022-08-13 DIAGNOSIS — I639 Cerebral infarction, unspecified: Secondary | ICD-10-CM | POA: Diagnosis not present

## 2022-08-13 LAB — BASIC METABOLIC PANEL
Anion gap: 12 (ref 5–15)
BUN: 27 mg/dL — ABNORMAL HIGH (ref 8–23)
CO2: 21 mmol/L — ABNORMAL LOW (ref 22–32)
Calcium: 8.5 mg/dL — ABNORMAL LOW (ref 8.9–10.3)
Chloride: 102 mmol/L (ref 98–111)
Creatinine, Ser: 1.57 mg/dL — ABNORMAL HIGH (ref 0.61–1.24)
GFR, Estimated: 42 mL/min — ABNORMAL LOW (ref >60)
Glucose, Bld: 92 mg/dL (ref 70–99)
Potassium: 4.2 mmol/L (ref 3.5–5.1)
Sodium: 135 mmol/L (ref 135–145)

## 2022-08-13 LAB — CBC
HCT: 23.5 % — ABNORMAL LOW (ref 39.0–52.0)
Hemoglobin: 8 g/dL — ABNORMAL LOW (ref 13.0–17.0)
MCH: 29.3 pg (ref 26.0–34.0)
MCHC: 34 g/dL (ref 30.0–36.0)
MCV: 86.1 fL (ref 80.0–100.0)
Platelets: 182 10*3/uL (ref 150–400)
RBC: 2.73 MIL/uL — ABNORMAL LOW (ref 4.22–5.81)
RDW: 12.4 % (ref 11.5–15.5)
WBC: 9.3 10*3/uL (ref 4.0–10.5)
nRBC: 0 % (ref 0.0–0.2)

## 2022-08-13 NOTE — Progress Notes (Signed)
Occupational Therapy Treatment Patient Details Name: Carlos Shields MRN: [id number] DOB: 05-20-1931 Today's Date: 08/13/2022   History of present illness Pt is 89 yo male admitted 08/05/22 with R sided weakness found to have small acute infarcts  involving the left basal ganglia, left frontal, parietal and occipital cortex and adjacent white matter.  Pt with hx of seizure on antiepileptics, CVA, hypertension, CKD stage IIIa, dysphagia   OT comments  Pt currently at mod assist level for sit to stand at this time and for step pivot transfers.  Min assist for standing balance at the sink during oral hygiene.  Significant delays in initiation and sequencing of oral hygiene task with mod demonstrational cueing to complete.  Still with noted RUE decreased FM and gross motor coordination but using at an active assist level to help with opening toothpaste and actively holding toothbrush for brushing at a much slower rate.  Recommend continued acute care OT at this time with transition to AIR for more intense therapy so he can get to a level that his family can provide safe 24 hr assist at home.     Recommendations for follow up therapy are one component of a multi-disciplinary discharge planning process, led by the attending physician.  Recommendations may be updated based on patient status, additional functional criteria and insurance authorization.    Follow Up Recommendations  Acute inpatient rehab (3hours/day)     Assistance Recommended at Discharge Frequent or constant Supervision/Assistance  Patient can return home with the following  Assistance with cooking/housework;Direct supervision/assist for medications management;Assist for transportation;Help with stairs or ramp for entrance;Direct supervision/assist for financial management;A lot of help with bathing/dressing/bathroom;A lot of help with walking and/or transfers   Equipment Recommendations  Other (comment) (TBD next venue of care)        Precautions / Restrictions Precautions Precautions: Fall;Other (comment) Precaution Comments: incontinent Restrictions Weight Bearing Restrictions: No       Mobility Bed Mobility Overal bed mobility: Needs Assistance Bed Mobility: Supine to Sit     Supine to sit: Mod assist Sit to supine: Mod assist   General bed mobility comments: Assist for bringing LEs off of the bed and scooting to the edge as well as support for lifting trunk up to sitting.  Mod assist for lifting LEs back in the bed and centering trunk.    Transfers Overall transfer level: Needs assistance Equipment used: Rolling walker (2 wheels) Transfers: Sit to/from Stand Sit to Stand: Mod assist     Step pivot transfers: Mod assist     General transfer comment: Mod assist for sit to stand from the EOB with mod demonstrational cueing for hand placement.  Increased posterior lean with initial standing.     Balance Overall balance assessment: Needs assistance Sitting-balance support: No upper extremity supported, Feet supported Sitting balance-Leahy Scale: Fair Sitting balance - Comments: Pt able to maintain static sitting balance EOB in preparation for standing.   Standing balance support: During functional activity, Single extremity supported Standing balance-Leahy Scale: Poor Standing balance comment: Posterior LOB in standing.  He needs BUE support for functional mobility.                           ADL either performed or assessed with clinical judgement   ADL Overall ADL's : Needs assistance/impaired     Grooming: Oral care;Minimal assistance;Standing                   Toilet Transfer:  Moderate assistance;Stand-pivot;BSC/3in1;Ambulation;Rolling walker (2 wheels) Toilet Transfer Details (indicate cue type and reason): simulated as pt with condom cath and denies need to have BM         Functional mobility during ADLs: Minimal assistance;Rolling walker (2 wheels) General ADL  Comments: Pt with decreased awareness of deficits from CVAs when asked.  He needed increased time for processing and sequencing oral hygiene at the sink.  Mod instructional and questioning cueing for taking toothbrush out of wrapper that therapist had already opened and for applying toothpaste.  Increased ataxia and decreased coordination noted in the RUE compared to the left when attempting to manipulate and open the toothpaste as well as when brushing.  Uses the RUE at an active assist level with increased time during session.      Cognition Arousal/Alertness: Awake/alert Behavior During Therapy: Flat affect Overall Cognitive Status: No family/caregiver present to determine baseline cognitive functioning Area of Impairment: Problem solving, Attention, Memory, Following commands, Awareness                 Orientation Level: Time Current Attention Level: Sustained Memory: Decreased short-term memory Following Commands: Follows one step commands with increased time Safety/Judgement: Decreased awareness of deficits, Decreased awareness of safety Awareness: Emergent Problem Solving: Slow processing, Requires verbal cues General Comments: Pt oriented to place and situation but not month or year when asked.  Increased time needed with mod instructional cueing for sequencing oral hygiene at the sink.              General Comments VSS on RA    Pertinent Vitals/ Pain       Pain Assessment Pain Assessment: Faces Faces Pain Scale: No hurt         Frequency  Min 2X/week        Progress Toward Goals  OT Goals(current goals can now be found in the care plan section)  Progress towards OT goals: Progressing toward goals  Acute Rehab OT Goals Patient Stated Goal: Pt agreed to work on oral hygiene during session. OT Goal Formulation: With patient Time For Goal Achievement: 08/20/22 Potential to Achieve Goals: Good  Plan Discharge plan remains appropriate       AM-PAC OT "6  Clicks" Daily Activity     Outcome Measure   Help from another person eating meals?: A Little Help from another person taking care of personal grooming?: A Little Help from another person toileting, which includes using toliet, bedpan, or urinal?: A Lot Help from another person bathing (including washing, rinsing, drying)?: A Lot Help from another person to put on and taking off regular upper body clothing?: A Little Help from another person to put on and taking off regular lower body clothing?: A Lot 6 Click Score: 15    End of Session Equipment Utilized During Treatment: Gait belt;Rolling walker (2 wheels)  OT Visit Diagnosis: Hemiplegia and hemiparesis Hemiplegia - Right/Left: Right Hemiplegia - dominant/non-dominant: Dominant Hemiplegia - caused by: Cerebral infarction   Activity Tolerance Patient tolerated treatment well   Patient Left in bed;with call bell/phone within reach;with bed alarm set   Nurse Communication Mobility status        Time: 1020-1107 OT Time Calculation (min): 47 min  Charges: OT General Charges $OT Visit: 1 Visit OT Treatments $Self Care/Home Management : 38-52 mins  Abrie Egloff OTR/L 08/13/2022, 11:38 AM

## 2022-08-13 NOTE — Care Management Important Message (Signed)
Important Message  Patient Details  Name: Carlos Shields MRN: [id number] Date of Birth: 1931-04-11   Medicare Important Message Given:  Yes     Shelda Altes 08/13/2022, 11:21 AM

## 2022-08-13 NOTE — Progress Notes (Addendum)
  Progress Note    08/13/2022 6:36 AM 1 Day Post-Op  Subjective:  no complaints; denies trouble swallowing  Afebrile HR 60's-70's  361'W-431'V systolic 40% RA  Gtts:  none  Vitals:   08/13/22 0044 08/13/22 0442  BP: 110/65 122/60  Pulse: 63 66  Resp: 17 15  Temp: 97.7 F (36.5 C) 98.6 F (37 C)  SpO2: 95% 99%    Physical Exam: General:  resting; no distress Lungs:  non labored Incisions:  left neck clean without hematoma; right groin soft without any further oozing.   Extremities:  bilateral hand grips are equal   CBC    Component Value Date/Time   WBC 9.3 08/13/2022 0405   RBC 2.73 (L) 08/13/2022 0405   HGB 8.0 (L) 08/13/2022 0405   HCT 23.5 (L) 08/13/2022 0405   PLT 182 08/13/2022 0405   MCV 86.1 08/13/2022 0405   MCH 29.3 08/13/2022 0405   MCHC 34.0 08/13/2022 0405   RDW 12.4 08/13/2022 0405   LYMPHSABS 2.3 08/05/2022 1428   MONOABS 0.8 08/05/2022 1428   EOSABS 0.0 08/05/2022 1428   BASOSABS 0.1 08/05/2022 1428    BMET    Component Value Date/Time   NA 135 08/13/2022 0405   K 4.2 08/13/2022 0405   CL 102 08/13/2022 0405   CO2 21 (L) 08/13/2022 0405   GLUCOSE 92 08/13/2022 0405   BUN 27 (H) 08/13/2022 0405   CREATININE 1.57 (H) 08/13/2022 0405   CALCIUM 8.5 (L) 08/13/2022 0405   GFRNONAA 42 (L) 08/13/2022 0405   GFRAA 51 (L) 11/19/2019 2118    INR    Component Value Date/Time   INR 1.3 (H) 08/05/2022 1428     Intake/Output Summary (Last 24 hours) at 08/13/2022 0636 Last data filed at 08/13/2022 0600 Gross per 24 hour  Intake 1889.55 ml  Output 670 ml  Net 1219.55 ml      Assessment/Plan:  89 y.o. male is s/p:  Left TCAR  1 Day Post-Op   -pt doing well this morning.  Neuro status unchanged.  -ambulate out of bed. -continue asa/statin/plavix -f/u in 4 weeks with carotid duplex on Dr. Donzetta Matters clinic day.   Leontine Locket, PA-C [facility] [phone] 08/13/2022 6:36 AM  I have independently interviewed and  examined patient and agree with PA assessment and plan above.   Johnathon Mittal C. Donzetta Matters, MD [facility] [place] Office: [phone] Pager: [phone]

## 2022-08-13 NOTE — Progress Notes (Signed)
[facility]                                                                              Carlos Shields, is a 89 y.o. male, DOB - 26-Jun-1931, HYI:[id number] Admit date - 08/05/2022    Outpatient Primary MD for the patient is [name], PA  LOS - 7  days  Chief Complaint  Patient presents with   Code Stroke   Seizures       Brief summary   Patient is a 89 year old male with history of seizure on antiepileptics, CVA, hypertension, CKD stage IIIa, dysphagia who presented with right-sided weakness.  Patient's daughter reported that normally ambulates with a walker.  On the day of admission, around 1 to 2 PM, he wet his bed which was unusual for him, she saw him falling out of the bed and seemed to be dragging his leg.  Right hand appeared twisted so the daughter thought that he was having a seizure and called EMS.  He had missed few doses of his Keppra due to issues with pharmacy and ran out of aspirin for a week.  Patient's son also had died the previous week. CT head was negative.  No tPA or thrombectomy as he was outside window.  His MRI significant for acute CVA, workup significant for carotid artery stenosis, vascular surgery consulted, and plan for carotid artery surgery.   Assessment & Plan    Acute CVA (cerebral vascular accident) ([place]) with prior history of CVA -MRI brain showed multiple small acute infarct involving the left basal ganglia, left frontal, right occipital cortex and adjacent white matter.  Slight edema without mass effect.  Distribution of stroke appears to be embolic but no prior history of A-fib. - MRA Head Negative intracranial MRA for large vessel occlusion.significant for Intracranial atherosclerotic disease with associated moderate stenosis at the left carotid siphon, with additional severe distal left P3 stenosis. No other proximal high-grade or correctable stenosis. -Lipid panel showed cholesterol 143, LDL 69. -PT/OT consulted,  recommendation for CIR -He is on aspirin, and plavix -See discussion below under carotid artery stenosis. Patient remains stable.  Left ICA stenosis Patient underwent TCAR on 2/1.  Vascular surgery following.  Patient is stable.    Seizures ([place]) -Due to concern for possible breakthrough seizure, patient received additional 1 g IV Keppra in ED -Continue Keppra 500 mg twice daily -Seizure precautions.  Acute on chronic CKD, stage IIIa/hyponatremia Baseline around 1.2, peaked at 2.1. Creatinine noted to be 1.8 on 1/31.  Patient was hydrated.  Creatinine has improved.  Encourage oral intake.  Monitor urine output.  Avoid nephrotoxic agents.  Creatinine stable for the most part.  No further workup anticipated. Renal ultrasound significant for chronic medical disease, no hydronephrosis  Essential hypertension Initially allowed permissive hypertension.  Now on metoprolol.  Monitor blood pressures closely.    Chronic normocytic anemia Hemoglobin baseline 9-10. Drop in hemoglobin today is likely dilutional.  Hemoglobin stable today.  Apparently had some bleeding from his groin site which has been followed by vascular surgery.  Appears to have stopped.  Abnormal TSH -TSH 4.9  Acute metabolic encephalopathy -Likely due  to acute CVA, and hospital delirium as well.   Code Status: Full  DVT Prophylaxis:  enoxaparin (LOVENOX) injection 30 mg Start: 08/13/22 0900 SCD's Start: 08/12/22 1231 Family Communication: Discussed with daughter who was at the bedside Disposition Plan:   CIR when medically stable   Procedures:  MRI brain, MRA  Consultants:   Neurology Vascular surgery  Antimicrobials: None   Medications  sodium chloride   Intravenous Once   aspirin EC  81 mg Oral Daily   clopidogrel  75 mg Oral Daily   enoxaparin (LOVENOX) injection  30 mg Subcutaneous Q24H   levETIRAcetam  500 mg Oral BID   metoprolol tartrate  12.5 mg Oral BID   pantoprazole  40 mg Oral Daily    rosuvastatin  10 mg Oral Daily      Subjective:   Patient denies any complaints this morning.  Eating his breakfast.  No nausea vomiting.  No chest pain or shortness of breath.  Able to move all of his extremities.   Objective:   Vitals:   08/13/22 0721 08/13/22 0731 08/13/22 0800 08/13/22 0827  BP: 133/76 110/87  110/87  Pulse: 65 72 72 72  Resp:  15  15  Temp: 98.3 F (36.8 C) 98.3 F (36.8 C)  98.3 F (36.8 C)  TempSrc: Oral     SpO2: 90% 97% 97% 97%  Weight:      Height:        Intake/Output Summary (Last 24 hours) at 08/13/2022 1041 Last data filed at 08/13/2022 1004 Gross per 24 hour  Intake 889.55 ml  Output 950 ml  Net -60.45 ml      Wt Readings from Last 3 Encounters:  08/05/22 51.7 kg  06/16/22 51.7 kg  04/25/21 54.4 kg     Exam  General appearance: Awake alert.  In no distress Resp: Clear to auscultation bilaterally.  Normal effort Cardio: S1-S2 is normal regular.  No S3-S4.  No rubs murmurs or bruit GI: Abdomen is soft.  Nontender nondistended.  Bowel sounds are present normal.  No masses organomegaly Extremities: No edema.  Moving all of his extremities    Data:   CBC Lab Results  Component Value Date   WBC 9.3 08/13/2022   RBC 2.73 (L) 08/13/2022   HGB 8.0 (L) 08/13/2022   HCT 23.5 (L) 08/13/2022   MCV 86.1 08/13/2022   MCH 29.3 08/13/2022   PLT 182 08/13/2022   MCHC 34.0 08/13/2022   RDW 12.4 08/13/2022   LYMPHSABS 2.3 08/05/2022   MONOABS 0.8 08/05/2022   EOSABS 0.0 08/05/2022   BASOSABS 0.1 99/83/3825     Last metabolic panel Lab Results  Component Value Date   NA 135 08/13/2022   K 4.2 08/13/2022   CL 102 08/13/2022   CO2 21 (L) 08/13/2022   BUN 27 (H) 08/13/2022   CREATININE 1.57 (H) 08/13/2022   GLUCOSE 92 08/13/2022   GFRNONAA 42 (L) 08/13/2022   GFRAA 51 (L) 11/19/2019   CALCIUM 8.5 (L) 08/13/2022   PHOS 2.9 05/04/2013   PROT 7.8 08/05/2022   ALBUMIN 4.3 08/05/2022   BILITOT 1.0 08/05/2022   ALKPHOS 66  08/05/2022   AST 29 08/05/2022   ALT 13 08/05/2022   ANIONGAP 12 08/13/2022      Bonnielee Haff M.D. [facility] 08/13/2022, 10:41 AM

## 2022-08-13 NOTE — Progress Notes (Signed)
Inpatient Rehabilitation Admissions Coordinator   I have begun insurance Auth with [facility] for possible Cir admit pending payor approval.  Danne Baxter, RN, MSN Rehab Admissions Coordinator [phone] 08/13/2022 12:24 PM

## 2022-08-13 NOTE — Progress Notes (Signed)
Inpatient Rehabilitation Admissions Coordinator   I await updated postoperative therapy treatment notes to resubmit clinicals for Auth with [place] for a possible CIR admit. Acute team have been notified.  Danne Baxter, RN, MSN Rehab Admissions Coordinator [phone] 08/13/2022 8:32 AM\

## 2022-08-13 NOTE — Progress Notes (Signed)
Physical Therapy Treatment Patient Details Name: Carlos Shields MRN: [id number] DOB: 1931/04/11 Today's Date: 08/13/2022   History of Present Illness Pt is 89 yo male admitted 08/05/22 with R sided weakness found to have small acute infarcts  involving the left basal ganglia, left frontal, parietal and occipital cortex and adjacent white matter.  Pt with hx of seizure on antiepileptics, CVA, hypertension, CKD stage IIIa, dysphagia    PT Comments    Pt greeted supine in bed and agreeable to session with continued progress towards acute goals. Pt oriented to self only and continues to be limited by decreased coordination, global weakness, impaired balance/postural reactions and R inattention. Pt able to complete bed mobility with cues for initiation and min assist to elevate trunk. Pt with noted flexion synergy seated EOB with anterior trunk and hip flexion with B feet hovering over floor, improving with multimodal cues. Pt continues to require increased assist to come to stand as pt with posterior bias requiring B feet blocking and max facilitation for anterior weight shift. Pt continues to grossly min assist during gait to steady and for safety awareness. Continue to recommend acute inpatient rehab (AIR) for post-acute therapy needs and anticipate pt able to tolerate intensity of AIR level therapies. Pt continues to benefit from skilled PT services to progress toward functional mobility goals.    Recommendations for follow up therapy are one component of a multi-disciplinary discharge planning process, led by the attending physician.  Recommendations may be updated based on patient status, additional functional criteria and insurance authorization.  Follow Up Recommendations  Acute inpatient rehab (3hours/day)     Assistance Recommended at Discharge Frequent or constant Supervision/Assistance  Patient can return home with the following A lot of help with walking and/or transfers;A lot of help with  bathing/dressing/bathroom   Equipment Recommendations  None recommended by PT    Recommendations for Other Services       Precautions / Restrictions Precautions Precautions: Fall;Other (comment) Precaution Comments: incontinent Restrictions Weight Bearing Restrictions: No     Mobility  Bed Mobility Overal bed mobility: Needs Assistance Bed Mobility: Supine to Sit     Supine to sit: Min assist Sit to supine: Mod assist   General bed mobility comments: min a to inittiate mobility to EOB and ot elevate trunk, mod a to return BLEs to bed and cetner in bed    Transfers Overall transfer level: Needs assistance Equipment used: Rolling walker (2 wheels) Transfers: Sit to/from Stand Sit to Stand: Mod assist           General transfer comment: modA to rise from edge of bed, increased posterior lean initially and increased trunk flexion, benefits from blocking B feet as feet sliding anterior on rise    Ambulation/Gait Ambulation/Gait assistance: Min assist, Mod assist Gait Distance (Feet): 115 Feet Assistive device: Rolling walker (2 wheels) Gait Pattern/deviations: Decreased stride length, Narrow base of support, Trunk flexed, Leaning posteriorly, Decreased step length - right, Decreased step length - left, Shuffle Gait velocity: decreased     General Gait Details: verbal cues for wider BOS and increased stride length, in addition to walker proximity and upright posture, mod a to maintain balance and facilitate anterior weight shift, moments of light min a, pt unable o coordinate side steps to [facility] this date at end of session   Stairs             Wheelchair Mobility    Modified Rankin (Stroke Patients Only) Modified Rankin (Stroke Patients Only) Pre-Morbid Rankin  Score: No significant disability Modified Rankin: Moderately severe disability     Balance Overall balance assessment: Needs assistance Sitting-balance support: No upper extremity supported, Feet  supported Sitting balance-Leahy Scale: Fair Sitting balance - Comments: posterior lean initially needing Min A progessing to Min guard, flexion synergy initially hip flexion and trunk flexion with feet hovering over floor   Standing balance support: During functional activity, Single extremity supported Standing balance-Leahy Scale: Poor Standing balance comment: Requiring RW or UE support for balance                            Cognition Arousal/Alertness: Awake/alert Behavior During Therapy: Flat affect Overall Cognitive Status: No family/caregiver present to determine baseline cognitive functioning Area of Impairment: Problem solving                 Orientation Level: Disoriented to, Time, Place, Situation       Safety/Judgement: Decreased awareness of safety, Decreased awareness of deficits Awareness: Emergent Problem Solving: Slow processing, Requires verbal cues, Requires tactile cues General Comments: Slower processing, but very participatory, oriented to self only        Exercises General Exercises - Upper Extremity Shoulder Flexion: AROM, Both, 5 reps Shoulder Extension: AROM, Both, 5 reps General Exercises - Lower Extremity Short Arc Quad: AROM, Right, Left, 10 reps, Seated Other Exercises Other Exercises: x5 STS throughout session    General Comments General comments (skin integrity, edema, etc.): VSS on RA      Pertinent Vitals/Pain Pain Assessment Pain Assessment: Faces Faces Pain Scale: No hurt Pain Intervention(s): Monitored during session    Home Living                          Prior Function            PT Goals (current goals can now be found in the care plan section) Acute Rehab PT Goals Patient Stated Goal: not stated PT Goal Formulation: With patient Time For Goal Achievement: 08/19/22 Progress towards PT goals: Progressing toward goals    Frequency    Min 3X/week      PT Plan Current plan remains  appropriate    Co-evaluation              AM-PAC PT "6 Clicks" Mobility   Outcome Measure  Help needed turning from your back to your side while in a flat bed without using bedrails?: A Little Help needed moving from lying on your back to sitting on the side of a flat bed without using bedrails?: A Little Help needed moving to and from a bed to a chair (including a wheelchair)?: A Little Help needed standing up from a chair using your arms (e.g., wheelchair or bedside chair)?: A Lot Help needed to walk in hospital room?: A Lot Help needed climbing 3-5 steps with a railing? : Total 6 Click Score: 14    End of Session Equipment Utilized During Treatment: Gait belt Activity Tolerance: Patient tolerated treatment well Patient left: with call bell/phone within reach;in bed;with bed alarm set (with bed in chair position) Nurse Communication: Mobility status PT Visit Diagnosis: Other abnormalities of gait and mobility (R26.89);Hemiplegia and hemiparesis Hemiplegia - Right/Left: Right Hemiplegia - dominant/non-dominant: Dominant Hemiplegia - caused by: Cerebral infarction     Time: 3785-8850 PT Time Calculation (min) (ACUTE ONLY): 28 min  Charges:  $Gait Training: 8-22 mins $Therapeutic Activity: 8-22 mins  Audry Riles. PTA Acute Rehabilitation Services Office: [place] 08/13/2022, 9:45 AM

## 2022-08-13 NOTE — Progress Notes (Signed)
Speech Language Pathology Treatment: Cognitive-Linquistic  Patient Details Name: Carlos Shields MRN: [id number] DOB: May 11, 1931 Today's Date: 08/13/2022 Time: 6010-9323 SLP Time Calculation (min) (ACUTE ONLY): 16 min  Assessment / Plan / Recommendation Clinical Impression  Mr. Carlos Shields was feeding himself lunch upon entering room. Needed intermittent verbal cues to find items on tray.  Used both UEs for self-feeding, often resorting to using his fingers; however, when cued to use spoon or fork he would successfully pick it up and resume feeding.  He demonstrated excellent sustained  attention to meal.  Followed commands well; demonstrated recognition of spills with verbal/visual cues needed to locate them in bed/on gown.  He was oriented to month/year and reason for hospitalization; stated he was "going upstairs" for therapy (referring to CIR/AIR)  Will benefit from SLP f/u in AIR to allow for as much independence as possible when he goes home with 24 hour support.   HPI HPI: Mr. Carlos Shields is an 89 y.o. male who presented to [facility] ED with acute CVA. MRI brain showed multiple small acute infarcts involving the left basal ganglia, left frontal, parietal and occipital cortex and adjacent white matter. PMHx seizures, prior CVA, CKD, HTN. Pt had an OP MBS on 1/11 - he c/o solid food dysphagia. Findings indicated occasional transient penetration of thin liquids (considered WFL) and delayed transition of 13 mm barium pill. Regular solids/thin liquids was recommended. Pt had f/u visit with OP SLP who reviewed findings and educated pt/family re: precautions. No SLP f/u was recommended.      SLP Plan  Continue SLP       Recommendations for follow up therapy are one component of a multi-disciplinary discharge planning process, led by the attending physician.  Recommendations may be updated based on patient status, additional functional criteria and insurance authorization.    Recommendations                   Oral  Care Recommendations: Oral care BID Follow Up Recommendations: Acute inpatient rehab (3hours/day) Assistance recommended at discharge: Frequent or constant Supervision/Assistance SLP Visit Diagnosis: Cognitive communication deficit (F57.322) Plan: Continue SLP         Carlos Bamberg L. Tivis Ringer, MA CCC/SLP Clinical Specialist - Acute Care SLP Acute Rehabilitation Services Office number [phone]   Carlos Shields  08/13/2022, 1:18 PM

## 2022-08-14 ENCOUNTER — Other Ambulatory Visit: Payer: Self-pay

## 2022-08-14 ENCOUNTER — Inpatient Hospital Stay (HOSPITAL_COMMUNITY)
Admission: RE | Admit: 2022-08-14 | Discharge: 2022-08-27 | DRG: 057 | Disposition: A | Discharge status: Home / Self Care | Payer: Medicare Other | Source: Intra-hospital | Attending: Physical Medicine and Rehabilitation | Admitting: Physical Medicine and Rehabilitation

## 2022-08-14 ENCOUNTER — Encounter (HOSPITAL_COMMUNITY): Payer: Self-pay | Admitting: Physical Medicine and Rehabilitation

## 2022-08-14 DIAGNOSIS — Z8673 Personal history of transient ischemic attack (TIA), and cerebral infarction without residual deficits: Secondary | ICD-10-CM

## 2022-08-14 DIAGNOSIS — Z7982 Long term (current) use of aspirin: Secondary | ICD-10-CM

## 2022-08-14 DIAGNOSIS — R32 Unspecified urinary incontinence: Secondary | ICD-10-CM | POA: Diagnosis not present

## 2022-08-14 DIAGNOSIS — I6340 Cerebral infarction due to embolism of unspecified cerebral artery: Secondary | ICD-10-CM | POA: Diagnosis not present

## 2022-08-14 DIAGNOSIS — E785 Hyperlipidemia, unspecified: Secondary | ICD-10-CM | POA: Diagnosis present

## 2022-08-14 DIAGNOSIS — I63212 Cerebral infarction due to unspecified occlusion or stenosis of left vertebral arteries: Secondary | ICD-10-CM

## 2022-08-14 DIAGNOSIS — D631 Anemia in chronic kidney disease: Secondary | ICD-10-CM | POA: Diagnosis present

## 2022-08-14 DIAGNOSIS — N179 Acute kidney failure, unspecified: Secondary | ICD-10-CM | POA: Diagnosis present

## 2022-08-14 DIAGNOSIS — R1312 Dysphagia, oropharyngeal phase: Secondary | ICD-10-CM | POA: Diagnosis not present

## 2022-08-14 DIAGNOSIS — I69393 Ataxia following cerebral infarction: Secondary | ICD-10-CM

## 2022-08-14 DIAGNOSIS — I872 Venous insufficiency (chronic) (peripheral): Secondary | ICD-10-CM | POA: Diagnosis present

## 2022-08-14 DIAGNOSIS — Z79899 Other long term (current) drug therapy: Secondary | ICD-10-CM | POA: Diagnosis not present

## 2022-08-14 DIAGNOSIS — F17290 Nicotine dependence, other tobacco product, uncomplicated: Secondary | ICD-10-CM | POA: Diagnosis present

## 2022-08-14 DIAGNOSIS — I6309 Cerebral infarction due to thrombosis of other precerebral artery: Principal | ICD-10-CM

## 2022-08-14 DIAGNOSIS — I6359 Cerebral infarction due to unspecified occlusion or stenosis of other cerebral artery: Secondary | ICD-10-CM | POA: Diagnosis not present

## 2022-08-14 DIAGNOSIS — I69351 Hemiplegia and hemiparesis following cerebral infarction affecting right dominant side: Secondary | ICD-10-CM | POA: Diagnosis not present

## 2022-08-14 DIAGNOSIS — Z8249 Family history of ischemic heart disease and other diseases of the circulatory system: Secondary | ICD-10-CM | POA: Diagnosis not present

## 2022-08-14 DIAGNOSIS — I129 Hypertensive chronic kidney disease with stage 1 through stage 4 chronic kidney disease, or unspecified chronic kidney disease: Secondary | ICD-10-CM | POA: Diagnosis present

## 2022-08-14 DIAGNOSIS — N1831 Chronic kidney disease, stage 3a: Secondary | ICD-10-CM | POA: Diagnosis present

## 2022-08-14 DIAGNOSIS — I639 Cerebral infarction, unspecified: Secondary | ICD-10-CM | POA: Diagnosis not present

## 2022-08-14 DIAGNOSIS — I69391 Dysphagia following cerebral infarction: Secondary | ICD-10-CM

## 2022-08-14 DIAGNOSIS — R946 Abnormal results of thyroid function studies: Secondary | ICD-10-CM | POA: Diagnosis not present

## 2022-08-14 DIAGNOSIS — Z95828 Presence of other vascular implants and grafts: Secondary | ICD-10-CM

## 2022-08-14 DIAGNOSIS — E871 Hypo-osmolality and hyponatremia: Secondary | ICD-10-CM | POA: Diagnosis present

## 2022-08-14 DIAGNOSIS — D649 Anemia, unspecified: Secondary | ICD-10-CM | POA: Diagnosis not present

## 2022-08-14 DIAGNOSIS — I10 Essential (primary) hypertension: Secondary | ICD-10-CM | POA: Diagnosis not present

## 2022-08-14 LAB — BASIC METABOLIC PANEL
Anion gap: 9 (ref 5–15)
BUN: 28 mg/dL — ABNORMAL HIGH (ref 8–23)
CO2: 22 mmol/L (ref 22–32)
Calcium: 8.3 mg/dL — ABNORMAL LOW (ref 8.9–10.3)
Chloride: 103 mmol/L (ref 98–111)
Creatinine, Ser: 1.64 mg/dL — ABNORMAL HIGH (ref 0.61–1.24)
GFR, Estimated: 40 mL/min — ABNORMAL LOW (ref >60)
Glucose, Bld: 97 mg/dL (ref 70–99)
Potassium: 4.2 mmol/L (ref 3.5–5.1)
Sodium: 134 mmol/L — ABNORMAL LOW (ref 135–145)

## 2022-08-14 MED ORDER — ENOXAPARIN SODIUM 30 MG/0.3ML IJ SOSY
30.0000 mg | PREFILLED_SYRINGE | INTRAMUSCULAR | Status: DC
  Administered 2022-08-15 – 2022-08-27 (×13): 30 mg via SUBCUTANEOUS
  Filled 2022-08-14 (×13): qty 0.3

## 2022-08-14 MED ORDER — PROCHLORPERAZINE EDISYLATE 10 MG/2ML IJ SOLN: 5.0000 mg | Freq: Four times a day (QID) | INTRAMUSCULAR | Status: DC | PRN

## 2022-08-14 MED ORDER — PANTOPRAZOLE SODIUM 40 MG PO TBEC
40.0000 mg | DELAYED_RELEASE_TABLET | Freq: Every day | ORAL | Status: DC
  Administered 2022-08-15 – 2022-08-27 (×13): 40 mg via ORAL
  Filled 2022-08-14 (×13): qty 1

## 2022-08-14 MED ORDER — DIPHENHYDRAMINE HCL 12.5 MG/5ML PO ELIX: 12.5000 mg | ORAL_SOLUTION | Freq: Four times a day (QID) | ORAL | Status: DC | PRN

## 2022-08-14 MED ORDER — POLYETHYLENE GLYCOL 3350 17 G PO PACK: 17.0000 g | PACK | Freq: Every day | ORAL | Status: DC | PRN

## 2022-08-14 MED ORDER — BISACODYL 10 MG RE SUPP: 10.0000 mg | Freq: Every day | RECTAL | Status: DC | PRN

## 2022-08-14 MED ORDER — LEVETIRACETAM 500 MG PO TABS
500.0000 mg | ORAL_TABLET | Freq: Two times a day (BID) | ORAL | Status: DC
  Administered 2022-08-14 – 2022-08-18 (×8): 500 mg via ORAL
  Filled 2022-08-14 (×8): qty 1

## 2022-08-14 MED ORDER — METOPROLOL TARTRATE 12.5 MG HALF TABLET
12.5000 mg | ORAL_TABLET | Freq: Two times a day (BID) | ORAL | Status: DC
  Administered 2022-08-14 – 2022-08-27 (×26): 12.5 mg via ORAL
  Filled 2022-08-14 (×26): qty 1

## 2022-08-14 MED ORDER — GUAIFENESIN-DM 100-10 MG/5ML PO SYRP: 5.0000 mL | ORAL_SOLUTION | Freq: Four times a day (QID) | ORAL | Status: DC | PRN

## 2022-08-14 MED ORDER — FLEET ENEMA 7-19 GM/118ML RE ENEM: 1.0000 | ENEMA | Freq: Once | RECTAL | Status: DC | PRN

## 2022-08-14 MED ORDER — ALUM & MAG HYDROXIDE-SIMETH 200-200-20 MG/5ML PO SUSP: 30.0000 mL | ORAL | Status: DC | PRN

## 2022-08-14 MED ORDER — PROCHLORPERAZINE 25 MG RE SUPP: 12.5000 mg | Freq: Four times a day (QID) | RECTAL | Status: DC | PRN

## 2022-08-14 MED ORDER — ASPIRIN 81 MG PO TBEC
81.0000 mg | DELAYED_RELEASE_TABLET | Freq: Every day | ORAL | Status: DC
  Administered 2022-08-15 – 2022-08-27 (×13): 81 mg via ORAL
  Filled 2022-08-14 (×13): qty 1

## 2022-08-14 MED ORDER — ACETAMINOPHEN 325 MG PO TABS: 325.0000 mg | ORAL_TABLET | ORAL | Status: DC | PRN

## 2022-08-14 MED ORDER — ROSUVASTATIN CALCIUM 5 MG PO TABS
10.0000 mg | ORAL_TABLET | Freq: Every day | ORAL | Status: DC
  Administered 2022-08-15 – 2022-08-27 (×13): 10 mg via ORAL
  Filled 2022-08-14 (×13): qty 2

## 2022-08-14 MED ORDER — CLOPIDOGREL BISULFATE 75 MG PO TABS
75.0000 mg | ORAL_TABLET | Freq: Every day | ORAL | Status: DC
  Administered 2022-08-15 – 2022-08-27 (×13): 75 mg via ORAL
  Filled 2022-08-14 (×13): qty 1

## 2022-08-14 MED ORDER — TRAZODONE HCL 50 MG PO TABS
25.0000 mg | ORAL_TABLET | Freq: Every evening | ORAL | Status: DC | PRN
  Administered 2022-08-16 – 2022-08-23 (×6): 50 mg via ORAL
  Filled 2022-08-14 (×6): qty 1

## 2022-08-14 MED ORDER — OXYCODONE HCL 5 MG PO TABS: 5.0000 mg | ORAL_TABLET | ORAL | Status: DC | PRN

## 2022-08-14 MED ORDER — PROCHLORPERAZINE MALEATE 5 MG PO TABS: 5.0000 mg | ORAL_TABLET | Freq: Four times a day (QID) | ORAL | Status: DC | PRN

## 2022-08-14 NOTE — Progress Notes (Signed)
Inpatient Rehabilitation Yes Medication Review by a Pharmacist  A complete drug regimen review was completed for this patient to identify any potential clinically significant medication issues.  High Risk Drug Classes Is patient taking? Indication by Medication  Antipsychotic Yes Prochlorperazine - nausea  Anticoagulant No   Antibiotic No   Opioid No   Antiplatelet Yes Asa, plavix - stroke  Hypoglycemics/insulin No   Vasoactive Medication Yes Amlodipine, metoprolol - HTN  Chemotherapy No   Other Yes Levetiracetam - seizures Pantoprazole - GERD Rosuvastatin - HLD/stroke     Type of Medication Issue Identified Description of Issue Recommendation(s)  Drug Interaction(s) (clinically significant)     Duplicate Therapy     Allergy     No Medication Administration End Date     Incorrect Dose     Additional Drug Therapy Needed     Significant med changes from prior encounter (inform family/care partners about these prior to discharge).    Other       Clinically significant medication issues were identified that warrant physician communication and completion of prescribed/recommended actions by midnight of the next day:  No  Time spent performing this drug regimen review (minutes):  20 minutes  Louanne Belton, PharmD, [facility] PGY1 Pharmacy Resident 08/14/2022 3:08 PM

## 2022-08-14 NOTE — Progress Notes (Signed)
Inpatient Rehab Admissions Coordinator:    I have a CIR bed and can admit this Pt. Today. MD to enter d/c order. RN may call report to [phone].   I notified Pt. ff transfer at bedside and called Pt.'s daughter [name] Odem. She gave consent for admission and  affirmed that the plan is for Pt. To participate in intensive rehab program for 10-12 days with the goal of reaching supervision level with PT/OT/SLP and discharging home with her, where she will provide 24/7 supervision and support.   Clemens Catholic, [place], [place] Admissions Coordinator  [phone] ([place]) [phone] (office)

## 2022-08-14 NOTE — Discharge Summary (Signed)
[facility]  Physician Discharge Summary   Patient ID: Carlos Shields MRN: [id number] DOB/AGE: [birth year] 89 y.o.  Admit date: 08/05/2022 Discharge date: 08/14/2022    PCP: [name], PA  DISCHARGE DIAGNOSES:    CVA (cerebral vascular accident) ([place])   Seizures ([place])   HTN (hypertension)   Anemia   Acute-on-chronic kidney injury ([place])   Abnormal TSH   Acute metabolic encephalopathy  PATIENT BEING DISCHARGED TO [place] [facility]  RECOMMENDATIONS FOR OUTPATIENT FOLLOW UP: Will need to be followed by neurology in the next 6 to 8 weeks  CODE STATUS: Full code  DISCHARGE CONDITION: fair   INITIAL HISTORY: Patient is a 89 year old male with history of seizure on antiepileptics, CVA, hypertension, CKD stage IIIa, dysphagia who presented with right-sided weakness.  Patient's daughter reported that normally ambulates with a walker.  On the day of admission, around 1 to 2 PM, he wet his bed which was unusual for him, she saw him falling out of the bed and seemed to be dragging his leg.  Right hand appeared twisted so the daughter thought that he was having a seizure and called EMS.  He had missed few doses of his Keppra due to issues with pharmacy and ran out of aspirin for a week.  Patient's son also had died the previous week. CT head was negative.  No tPA or thrombectomy as he was outside window.  His MRI significant for acute CVA, workup significant for carotid artery stenosis, vascular surgery consulted, and plan for carotid artery surgery.   HOSPITAL COURSE:   Acute CVA (cerebral vascular accident) ([place]) with prior history of CVA -MRI brain showed multiple small acute infarct involving the left basal ganglia, left frontal, right occipital cortex and adjacent white matter.  Slight edema without mass effect.  Distribution of stroke appears to be embolic but no prior history of A-fib. - MRA Head Negative intracranial MRA for large vessel  occlusion.significant for Intracranial atherosclerotic disease with associated moderate stenosis at the left carotid siphon, with additional severe distal left P3 stenosis. No other proximal high-grade or correctable stenosis. -Lipid panel showed cholesterol 143, LDL 69.  Patient is on statin. -PT/OT consulted, recommendation for CIR -He is on aspirin, and plavix -See discussion below under carotid artery stenosis. Patient remains stable.  Okay for discharge to inpatient rehabilitation.   Left ICA stenosis Patient underwent TCAR on 2/1.   Patient is stable.     Seizures ([place]) -Due to concern for possible breakthrough seizure, patient received additional 1 g IV Keppra in ED -Continue Keppra 500 mg twice daily -Seizure precautions.   Acute on chronic CKD, stage IIIa/hyponatremia Baseline around 1.2, peaked at 2.1. Creatinine noted to be 1.8 on 1/31.  Patient was hydrated.  Creatinine has improved.  Encourage oral intake.   Renal ultrasound significant for chronic medical disease, no hydronephrosis   Essential hypertension Initially allowed permissive hypertension.  Now on metoprolol.  Monitor blood pressures closely.     Chronic normocytic anemia/possible component of acute blood loss anemia as well Hemoglobin baseline 9-10. Drop in hemoglobin is likely dilutional.  Hemoglobin stable.  No further oozing noted from the right groin site.     Abnormal TSH -TSH 4.9   Acute metabolic encephalopathy -Likely due to acute CVA, and hospital delirium as well. Now back to baseline  Patient is stable.  Okay for discharge to inpatient rehabilitation.  PERTINENT LABS:  The results of significant diagnostics from this hospitalization (including imaging, microbiology, ancillary and laboratory) are  listed below for reference.    Labs:   Basic Metabolic Panel: Recent Labs  Lab 08/11/22 0623 08/12/22 0344 08/13/22 0405 08/14/22 1522 08/15/22 0647  NA 133* 127* 135 134* 132*  K 3.8 3.5  4.2 4.2 4.4  CL 103 100 102 103 104  CO2 23 21* 21* 22 18*  GLUCOSE 95 84 92 97 92  BUN 33* 27* 27* 28* 32*  CREATININE 1.82* 1.43* 1.57* 1.64* 1.61*  CALCIUM 8.7* 7.5* 8.5* 8.3* 8.5*  MG  --   --   --   --  1.9    CBC: Recent Labs  Lab 08/09/22 0341 08/11/22 0623 08/12/22 0344 08/13/22 0405 08/15/22 0647  WBC 5.6 5.8 5.6 9.3 6.3  NEUTROABS  --   --   --   --  3.4  HGB 9.0* 9.4* 8.2* 8.0* 8.3*  HCT 26.8* 28.0* 26.5* 23.5* 25.6*  MCV 87.3 86.7 91.7 86.1 89.8  PLT 179 190 163 182 187      IMAGING STUDIES Structural Heart Procedure  Result Date: 08/12/2022 See surgical note for result.  [facility] CAROTID (at [facility] and [facility])  Result Date: 08/09/2022 Carotid Arterial Duplex Study Patient Name:  Carlos ClevelandROY Carlos Shields  Date of Exam:   08/06/2022 Medical Rec #: [id number]  Accession #:    [id number] Date of Birth: 11/23/1930  Patient Gender: M Patient Age:   [age] years [facility] Procedure:      VAS US CAROTID Referring Phys: CHING TU --------------------------------------------------------------------------------  Indications:   CVA. Risk Factors:  Hypertension. Other Factors: Seizures. Performing Technologist: Jean Rosenthalachel Hodge RDMS, RVT  Examination Guidelines: A complete evaluation includes B-mode imaging, spectral Doppler, color Doppler, and power Doppler as needed of all accessible portions of each vessel. Bilateral testing is considered an integral part of a complete examination. Limited examinations for reoccurring indications may be performed as noted.  Right Carotid Findings: +----------+--------+--------+--------+---------------------------+--------+           PSV cm/sEDV cm/sStenosisPlaque Description         Comments +----------+--------+--------+--------+---------------------------+--------+ CCA Prox  57      15                                                  +----------+--------+--------+--------+---------------------------+--------+ CCA Distal49      13                                                   +----------+--------+--------+--------+---------------------------+--------+ ICA Prox  226     74      60-79%  hypoechoic and heterogenous         +----------+--------+--------+--------+---------------------------+--------+ ICA Mid   166     49                                         tortuous +----------+--------+--------+--------+---------------------------+--------+ ICA Distal64      20                                                  +----------+--------+--------+--------+---------------------------+--------+  ECA       37                                                          +----------+--------+--------+--------+---------------------------+--------+ +----------+--------+-------+---------+-------------------+           PSV cm/sEDV cmsDescribe Arm Pressure (mmHG) +----------+--------+-------+---------+-------------------+ [id number]             Turbulent                    +----------+--------+-------+---------+-------------------+ +---------+--------+--+--------+--+---------+ VertebralPSV cm/s48EDV cm/s15Antegrade +---------+--------+--+--------+--+---------+  Left Carotid Findings: +---------+--------+-------+--------+------------------------+-----------------+          PSV cm/sEDV    StenosisPlaque Description      Comments                           cm/s                                                     +---------+--------+-------+--------+------------------------+-----------------+ CCA Prox 71      9                                      intimal                                                                   thickening        +---------+--------+-------+--------+------------------------+-----------------+ CCA      41      7                                                        Distal                                                                     +---------+--------+-------+--------+------------------------+-----------------+ ICA Prox 255     75     60-79%  heterogenous and                                                          irregular                                 +---------+--------+-------+--------+------------------------+-----------------+ ICA Mid  107     22                                     tortuous          +---------+--------+-------+--------+------------------------+-----------------+ ICA      80      20                                                       Distal                                                                    +---------+--------+-------+--------+------------------------+-----------------+ ECA      37                                                               +---------+--------+-------+--------+------------------------+-----------------+ +----------+--------+--------+---------+-------------------+           PSV cm/sEDV cm/sDescribe Arm Pressure (mmHG) +----------+--------+--------+---------+-------------------+ Subclavian221             Turbulent                    +----------+--------+--------+---------+-------------------+ +---------+--------+--+--------+--+---------+ VertebralPSV cm/s44EDV cm/s12Antegrade +---------+--------+--+--------+--+---------+   Summary: Right Carotid: Velocities in the right ICA are consistent with a 60-79%                stenosis. Left Carotid: Velocities in the left ICA are consistent with a 60-79% stenosis. Vertebrals:  Bilateral vertebral arteries demonstrate antegrade flow. Subclavians: Bilateral subclavian artery flow was disturbed. *See table(s) above for measurements and observations.  Electronically signed by Antony Contras MD on 08/09/2022 at 2:14:14 PM.    Final    CT ANGIO NECK W OR WO CONTRAST  Result Date: 08/08/2022 CLINICAL DATA:  Follow-up examination for stroke. EXAM: CT ANGIOGRAPHY NECK TECHNIQUE:  Multidetector CT imaging of the neck was performed using the standard protocol during bolus administration of intravenous contrast. Multiplanar CT image reconstructions and MIPs were obtained to evaluate the vascular anatomy. Carotid stenosis measurements (when applicable) are obtained utilizing NASCET criteria, using the distal internal carotid diameter as the denominator. RADIATION DOSE REDUCTION: This exam was performed according to the departmental dose-optimization program which includes automated exposure control, adjustment of the mA and/or kV according to patient size and/or use of iterative reconstruction technique. CONTRAST:  67mL OMNIPAQUE IOHEXOL 350 MG/ML SOLN COMPARISON:  Prior MRI from 08/05/2022. FINDINGS: Aortic arch: Visualized aortic arch normal caliber with standard branch pattern. Atheromatous change about the arch itself without high-grade stenosis. Right carotid system: Right common and internal carotid arteries are patent without dissection. Moderate to advanced atheromatous change about the right carotid bulb/proximal right ICA with associated stenosis of up to 65% by NASCET criteria. Calcified atherosclerosis noted about the right carotid siphon. Left carotid system: Left common and internal carotid arteries are patent without dissection.  Moderate to advanced atheromatous change about the left carotid bulb/proximal left ICA with associated stenosis of up to 75% by NASCET criteria. Calcified atherosclerosis noted about the left carotid siphon. Vertebral arteries: Both vertebral arteries arise from subclavian arteries. Proximal subclavian atherosclerosis with associated up to 50% stenosis on the left (series 7, image 175). Vertebral arteries M cells are patent without dissection or hemodynamically significant stenosis. Skeleton: No discrete or worrisome osseous lesions. Poor dentition noted. Other neck: No other acute soft tissue abnormality about the neck. Upper chest: Visualized upper chest  demonstrates no acute finding. IMPRESSION: 1. Moderate to advanced atheromatous change about the carotid bifurcations/proximal ICAs with associated stenoses of up to 65% on the right and 75% on the left. 2. 50% proximal left subclavian artery stenosis. 3. Wide patency of both vertebral arteries within the neck. Aortic Atherosclerosis (ICD10-I70.0). Electronically Signed   By: Jeannine Boga M.D.   On: 08/08/2022 22:09   ECHOCARDIOGRAM COMPLETE  Result Date: 08/06/2022    ECHOCARDIOGRAM REPORT   Patient Name:   MUSSA GROESBECK  Date of Exam: 08/06/2022 Medical Rec #:  [id number]  Height:       64.0 in Accession #:    [id number] Weight:       114.0 lb Date of Birth:  10-29-1930  BSA:          1.541 m Patient Age:    89 years   BP:           129/72 mmHg Patient Gender: M          HR:           69 bpm. Exam Location:  Inpatient Procedure: 2D Echo, Cardiac Doppler and Color Doppler Indications:    Stroke I63.9  History:        Patient has no prior history of Echocardiogram examinations.                 Stroke; Signs/Symptoms:Syncope. CKD, stage III.  Sonographer:    Ronny Flurry Referring Phys: [id number] [place] T TU IMPRESSIONS  1. Left ventricular ejection fraction, by estimation, is 60 to 65%. The left ventricle has normal function. The left ventricle has no regional wall motion abnormalities. Left ventricular diastolic parameters are consistent with Grade I diastolic dysfunction (impaired relaxation).  2. Right ventricular systolic function is normal. The right ventricular size is normal.  3. The mitral valve is abnormal. Trivial mitral valve regurgitation. No evidence of mitral stenosis.  4. The aortic valve is tricuspid. There is mild calcification of the aortic valve. There is mild thickening of the aortic valve. Aortic valve regurgitation is not visualized. Aortic valve sclerosis is present, with no evidence of aortic valve stenosis.  5. The inferior vena cava is normal in size with greater than 50% respiratory  variability, suggesting right atrial pressure of 3 mmHg. FINDINGS  Left Ventricle: Left ventricular ejection fraction, by estimation, is 60 to 65%. The left ventricle has normal function. The left ventricle has no regional wall motion abnormalities. The left ventricular internal cavity size was normal in size. There is  no left ventricular hypertrophy. Left ventricular diastolic parameters are consistent with Grade I diastolic dysfunction (impaired relaxation). Right Ventricle: The right ventricular size is normal. No increase in right ventricular wall thickness. Right ventricular systolic function is normal. Left Atrium: Left atrial size was normal in size. Right Atrium: Right atrial size was normal in size. Pericardium: There is no evidence of pericardial effusion. Mitral Valve: The mitral valve is abnormal. There is  mild thickening of the mitral valve leaflet(s). There is mild calcification of the mitral valve leaflet(s). Trivial mitral valve regurgitation. No evidence of mitral valve stenosis. Tricuspid Valve: The tricuspid valve is normal in structure. Tricuspid valve regurgitation is not demonstrated. No evidence of tricuspid stenosis. Aortic Valve: The aortic valve is tricuspid. There is mild calcification of the aortic valve. There is mild thickening of the aortic valve. Aortic valve regurgitation is not visualized. Aortic valve sclerosis is present, with no evidence of aortic valve stenosis. Aortic valve mean gradient measures 3.0 mmHg. Aortic valve peak gradient measures 6.7 mmHg. Aortic valve area, by VTI measures 2.30 cm. Pulmonic Valve: The pulmonic valve was normal in structure. Pulmonic valve regurgitation is not visualized. No evidence of pulmonic stenosis. Aorta: The aortic root is normal in size and structure. Venous: The inferior vena cava is normal in size with greater than 50% respiratory variability, suggesting right atrial pressure of 3 mmHg. IAS/Shunts: No atrial level shunt detected by  color flow Doppler.  LEFT VENTRICLE PLAX 2D LVOT diam:     2.20 cm   Diastology LV SV:         59        LV e' medial:    6.42 cm/s LV SV Index:   38        LV E/e' medial:  9.8 LVOT Area:     3.80 cm  LV e' lateral:   7.40 cm/s                          LV E/e' lateral: 8.5  RIGHT VENTRICLE            IVC RV S prime:     7.46 cm/s  IVC diam: 1.10 cm TAPSE (M-mode): 1.5 cm LEFT ATRIUM             Index        RIGHT ATRIUM          Index LA Vol (A2C):   24.0 ml 15.58 ml/m  RA Area:     7.28 cm LA Vol (A4C):   16.9 ml 10.97 ml/m  RA Volume:   11.40 ml 7.40 ml/m LA Biplane Vol: 20.9 ml 13.57 ml/m  AORTIC VALVE AV Area (Vmax):    2.36 cm AV Area (Vmean):   2.36 cm AV Area (VTI):     2.30 cm AV Vmax:           129.00 cm/s AV Vmean:          86.700 cm/s AV VTI:            0.258 m AV Peak Grad:      6.7 mmHg AV Mean Grad:      3.0 mmHg LVOT Vmax:         79.95 cm/s LVOT Vmean:        53.800 cm/s LVOT VTI:          0.156 m LVOT/AV VTI ratio: 0.60  AORTA Ao Root diam: 3.30 cm Ao Asc diam:  3.10 cm MITRAL VALVE MV Area (PHT): 1.98 cm    SHUNTS MV Decel Time: 384 msec    Systemic VTI:  0.16 m MV E velocity: 62.60 cm/s  Systemic Diam: 2.20 cm MV A velocity: 89.10 cm/s MV E/A ratio:  0.70 Charlton Haws MD Electronically signed by Charlton Haws MD Signature Date/Time: 08/06/2022/12:37:27 PM    Final    US RENAL  Result Date: 08/06/2022 CLINICAL  DATA:  Acute kidney injury EXAM: RENAL / URINARY TRACT ULTRASOUND COMPLETE COMPARISON:  CT abdomen 07/02/2022 FINDINGS: Right Kidney: Renal measurements: 10.4 by 6.0 by 4.6 cm = volume: 150 mL. Echogenic parenchyma compatible with chronic medical renal disease. No solid mass or hydronephrosis visualized. Small right kidney lower pole cyst seen on image 13 series 1. Left Kidney: Renal measurements: 10.2 by 5.3 by 4.7 cm = volume: 134 mL. Echogenic parenchyma compatible with chronic medical renal disease. No mass or hydronephrosis visualized. The 4 mm left kidney upper pole  calculus shown on the CT from 07/02/2022 is well seen today. Bladder: Appears normal for degree of bladder distention. Other: None. IMPRESSION: 1. Bilateral echogenic renal parenchyma compatible with chronic medical renal disease. 2. No hydronephrosis. 3. The 4 mm left renal calculus shown on recent CT is not readily apparent on today's ultrasound exam. It may have passed or simply be occult sonographically. The technologist notes suboptimal images due to patient breathing and overlying bowel gas. Electronically Signed   By: Gaylyn RongWalter  Liebkemann M.D.   On: 08/06/2022 12:22   MR ANGIO HEAD WO CONTRAST  Result Date: 08/06/2022 CLINICAL DATA:  Follow-up examination for stroke. EXAM: MRA HEAD WITHOUT CONTRAST TECHNIQUE: Angiographic images of the Circle of Willis were acquired using MRA technique without intravenous contrast. COMPARISON:  Prior brain MRI from earlier the same day. FINDINGS: Anterior circulation: Visualized distal cervical segments of the internal carotid arteries are patent with antegrade flow. Atheromatous irregularity within the carotid siphons with associated moderate stenosis at the anterior genu of the left carotid siphon (series [id number], image 7). A1 segments, anterior communicating complex common anterior cerebral arteries patent bilaterally. Both M1 segments patent. Left M1 bifurcates early. No proximal MCA branch occlusion or high-grade stenosis. Distal MCA branches perfused and symmetric. Posterior circulation: Atheromatous irregularity within the left V4 segment without high-grade stenosis. Right vertebral artery dominant and widely patent. Right vertebral artery dominant. Both PICA patent. Basilar widely patent without stenosis. Superior cerebellar arteries patent bilaterally. Both PCAs primarily supplied via the basilar, although a prominent left posterior is noted. Right PCA patent to its distal aspect without stenosis. Probable focal severe distal left P3 stenosis noted (series 1028,  image 27). Atheromatous irregularity within the left P2 segment without high-grade stenosis. Anatomic variants: None significant. Other: No intracranial aneurysm. IMPRESSION: 1. Negative intracranial MRA for large vessel occlusion. 2. Intracranial atherosclerotic disease with associated moderate stenosis at the left carotid siphon, with additional severe distal left P3 stenosis. No other proximal high-grade or correctable stenosis Electronically Signed   By: Rise MuBenjamin  McClintock M.D.   On: 08/06/2022 00:02   MR BRAIN WO CONTRAST  Result Date: 08/05/2022 CLINICAL DATA:  Neuro deficit, acute, stroke suspected EXAM: MRI HEAD WITHOUT CONTRAST TECHNIQUE: Multiplanar, multiecho pulse sequences of the brain and surrounding structures were obtained without intravenous contrast. COMPARISON:  Same day CT head. FINDINGS: Brain: Multiple small acute infarcts involving the left basal ganglia, left frontal, parietal and occipital cortex and adjacent white matter. Slight edema without mass effect. Additional scattered T2/FLAIR hyperintensities within the white matter, compatible with chronic microvascular ischemic disease. No evidence of acute hemorrhage, mass lesion, midline shift or fall hydrocephalus. Cerebral atrophy. Vascular: Major arterial flow voids are maintained. Skull and upper cervical spine: Normal marrow signal. Sinuses/Orbits: Clear sinuses.  No acute orbital findings. Other: No mastoid effusions. IMPRESSION: 1. Multiple small acute infarcts involving the left basal ganglia, left frontal, parietal and occipital cortex and adjacent white matter. Slight edema without mass effect. 2. Advanced  brain atrophy (ICD10-G31.9). and chronic microvascular ischemic disease. Electronically Signed   By: Feliberto Harts M.D.   On: 08/05/2022 18:42   CT HEAD CODE STROKE WO CONTRAST  Result Date: 08/05/2022 CLINICAL DATA:  Code stroke.  Neuro deficit, acute, stroke suspected EXAM: CT HEAD WITHOUT CONTRAST TECHNIQUE:  Contiguous axial images were obtained from the base of the skull through the vertex without intravenous contrast. RADIATION DOSE REDUCTION: This exam was performed according to the departmental dose-optimization program which includes automated exposure control, adjustment of the mA and/or kV according to patient size and/or use of iterative reconstruction technique. COMPARISON:  Apr 25, 2021 FINDINGS: Brain: Patchy white matter hypodensities, nonspecific but compatible with chronic microvascular ischemic disease. Similar chronic lacunar infarcts involving the basal ganglia and thalami. No evidence of acute large vascular territory infarct, acute hemorrhage, mass lesion or midline shift. Vascular: Calcific atherosclerosis. No hyperdense vessel identified. Skull: No acute fracture. Sinuses/Orbits: Clear sinuses.  No acute orbital findings. Other: No mastoid effusions. ASPECTS [facility] Stroke Program Early CT Score) total score (0-10 with 10 being normal): 10. IMPRESSION: 1. No evidence of acute abnormality. ASPECTS is 10. 2. Similar chronic microvascular ischemic disease. Findings discussed with Dr. Wallace Cullens via telephone at 2:44 PM. Electronically Signed   By: Feliberto Harts M.D.   On: 08/05/2022 14:45   DG SWALLOW FUNC OP MEDICARE SPEECH PATH  Result Date: 07/22/2022 Table formatting from the original result was not included. Images from the original result were not included. Objective Swallowing Evaluation: Type of Study: MBS-Modified Barium Swallow Study  Patient Details Name: Luman Holway MRN: [id number] Date of Birth: Nov 19, 1930 Today's Date: 07/22/2022 Time: SLP Start Time (ACUTE ONLY): 1331 -SLP Stop Time (ACUTE ONLY): 1355 SLP Time Calculation (min) (ACUTE ONLY): 24 min Past Medical History: Past Medical History: Diagnosis Date  CKD (chronic kidney disease) stage 3, GFR 30-59 ml/min (HCC)   Gait abnormality   Hypertension   Pancreatitis   Seizures (HCC)   Venous insufficiency  Past Surgical History: Past  Surgical History: Procedure Laterality Date  HERNIA REPAIR   HPI: pt is an 89 yo male referred for OP MBS due to concerns for pt having pharyngeal dysphagia.  Pt noted to have potential small Zenker's diverticulum.  Prior brain imaging showed atrophy, lacunar cva bilateral thalamic, brainstem and small left parietal cva.   Pt reports more issues swallowing foods than liquids, sensing lodging - pointing to left side of his throat.   He denies requiring heimlich manuever, losing weight unintentionally or having recurrent pneumonias.  Prior h/o ETOH use - ceased many years ago per pt - and pt currently uses cigars per GI note.  No family present when xray staff went to retrieve pt for MBS therefore testing completed as ordered.  Subjective: pt awake in bed  Recommendations for follow up therapy are one component of a multi-disciplinary discharge planning process, led by the attending physician.  Recommendations may be updated based on patient status, additional functional criteria and insurance authorization. Assessment / Plan / Recommendation   07/22/2022   2:00 PM Clinical Impressions Clinical Impression Patient presents with minimal oral difficulties with solids with vertical mastication pattern and requiring 3 swallows to clear small bolus of cracker.  Functional oropharyngeal swallow with thin, nectar,  and pudding.  Pt transited tablet into pharynx without liquid - as was pooled at vallecular space when xray was turned on.  Liquid swallows transited tablet into esophagus.  Suspect UES impaired opening/relaxation preventing tablet from moving distally beyond UES despite  several extra boluses of variable consistencies of barium.  This replicated pt's dysphagia symptoms and he never sensed tablet clearance.  Please see image below the test results. Trace laryngeal penetration of thin noted which is [facility] for his age.  Single episode of penetration of thin to cords via cup x1 due to impaired timing of laryngeal closure.   Cued cough effectively cleared.  Pharyngeal swallow is strong without retention.  As daughter was not present for exam and given pt's advanced age, recommend consider follow up session with HH or OP SLP for dysphagia education.  Thanks for this referral.  Educated pt to test results while testing completed.  SLP advised radiation technologist to advise daughter to tablet halting and need for pt to drink plenty of liquids today and inform daughter that SLP will review flouro loops and report will be placed in [place] today. SLP Visit Diagnosis Dysphagia, oral phase (R13.11) Impact on safety and function Mild aspiration risk      No data to display         No data to display      07/22/2022   2:00 PM Diet Recommendations SLP Diet Recommendations Regular solids;Thin liquid Liquid Administration via Cup;Straw Medication Administration -- Compensations Slow rate;Small sips/bites Postural Changes Seated upright at 90 degrees;Remain semi-upright after after feeds/meals (Comment)     07/22/2022   2:00 PM Other Recommendations Follow Up Recommendations Outpatient SLP    No data to display        07/22/2022   2:00 PM Oral Phase Oral Phase Impaired Oral - Nectar Cup [facility] Oral - Thin Teaspoon WFL Oral - Thin Cup [facility];Piecemeal swallowing;Premature spillage Oral - Thin Straw WFL;Piecemeal swallowing;Premature spillage Oral - Puree WFL Oral - Mech Soft Impaired mastication;Lingual/palatal residue;Decreased bolus cohesion Oral - Pill Premature spillage Oral Phase - Comment pt with prolonged vertical mastication pattern with small solid bolus-  with oral retention - clearing with 3 swallows    07/22/2022   2:00 PM Pharyngeal Phase Pharyngeal Phase WFL;Impaired Pharyngeal- Nectar Cup [facility] Pharyngeal- Thin Teaspoon WFL Pharyngeal- Thin Cup [facility];Penetration/Apiration after swallow Pharyngeal Material enters airway, CONTACTS cords and not ejected out Pharyngeal- Thin Straw WFL Pharyngeal- Puree WFL Pharyngeal- Mechanical Soft WFL Pharyngeal-  Pill Delayed swallow initiation-vallecula Pharyngeal Comment Single episode of trace laryngeal penetration to vocal cords of thin with sequential swallows - that cleared with furhter swallows - WFL for his age, single episode of penetration to vocal cord without sensation -  due to impaired timing of laryngeal closure with sequential swallows, cued cough effective to clear barium    07/22/2022   2:00 PM Cervical Esophageal Phase  Cervical Esophageal Phase Impaired Nectar Cup [facility] Thin Teaspoon WFL Thin Cup WFL Thin Straw WFL Puree WFL Mechanical Soft WFL Pill Reduced cricopharyngeal relaxation;Other (Comment) Cervical Esophageal Comment Tablet appeared to halt above UES - with replication of symptoms of halting in left side of pharynx.  Challenging view given pt was in wheelchair obscuring view.  In A-P view, tablet appeared slightly to the left in the esophagus. Mulitple boluse of thin liquid, pudding, thin warm water did not transit tablet.  Pt continued with sensation of retention - Suspect this is source of his dysphagia.  Tablet did not transit back into pharynx fortunately. Chales Abrahams 07/22/2022, 6:04 PM        complete   O  CLINICAL DATA:  Dysphagia. EXAM: MODIFIED BARIUM SWALLOW TECHNIQUE: Different consistencies of barium were administered orally to the patient by the Speech Pathologist. Imaging of the pharynx was performed in the lateral projection. Mina MarbleMatthew Segal, PA was present in the fluoroscopy room during this study, which was supervised and interpreted by Dr. Sebastian AcheAllen Grady. FLUOROSCOPY: Radiation Exposure Index (as provided by the fluoroscopic device): 8.60 mGy Kerma COMPARISON:  Esophagram 06/18/2022 FINDINGS: No aspiration was observed. A small posterior outpouching is again seen at the C5 level suggestive of a small Zenker diverticulum. A 13 mm barium tablet lodged in the cervical esophagus and did not pass despite multiple additional swallows of barium.  IMPRESSION: 1. Suspected small Zenker diverticulum. 2. Barium tablet obstructed in the cervical esophagus. Please refer to the Speech Pathologist's report for complete details and recommendations. Electronically Signed   By: Sebastian AcheAllen  Grady M.D.   On: 07/22/2022 17:22   DISCHARGE EXAMINATION: Vitals:   08/14/22 0342 08/14/22 0803 08/14/22 1137 08/14/22 1200  BP: 130/65 129/68 118/64 117/60  Pulse: 64 81 65   Resp: 14 20 16 15   Temp: 98.5 F (36.9 C) 98.2 F (36.8 C) 98.2 F (36.8 C)   TempSrc: Oral Oral Oral   SpO2: 94% 99% 97%   Weight:      Height:       General appearance: Awake alert.  In no distress Resp: Clear to auscultation bilaterally.  Normal effort Cardio: S1-S2 is normal regular.  No S3-S4.  No rubs murmurs or bruit GI: Abdomen is soft.  Nontender nondistended.  Bowel sounds are present normal.  No masses organomegaly   DISPOSITION: CIR     Current Inpatient Medications: Scheduled:  aspirin EC  81 mg Oral Daily   clopidogrel  75 mg Oral Daily   enoxaparin (LOVENOX) injection  30 mg Subcutaneous Q24H   levETIRAcetam  500 mg Oral BID   metoprolol tartrate  12.5 mg Oral BID   pantoprazole  40 mg Oral Daily   rosuvastatin  10 mg Oral Daily     ZOX:[id number]:acetaminophen **OR** [DISCONTINUED] acetaminophen (TYLENOL) oral liquid 160 mg/5 mL **OR** [DISCONTINUED] acetaminophen, alum & mag hydroxide-simeth, guaiFENesin-dextromethorphan, labetalol, ondansetron, oxyCODONE, phenol     Follow-up Information     [facility]).   Specialty: Vascular Surgery Why: Office will call you to arrange your appt (sent). Contact information: [address] [place] [id number] [phone]                TOTAL DISCHARGE TIME: 35 minutes  Alfred Harrel Rito EhrlichKrishnan  [facility] Pager on [email or web address]  08/15/2022, 10:27 AM

## 2022-08-14 NOTE — H&P (Shared)
Physical Medicine and Rehabilitation Admission H&P    No chief complaint on file.   HPI: Carlos Shields is a 89 y.o. male with a PMHx of seizure on Keppra 500mg  BID, CVA, hypertension, CKD stage IIIa, pancreatitis, venous insufficiency, and dysphagia who was admitted on 08/05/22 for right-sided weakness, incontinence, seizure like activity, and fall. He had been out of his Keppra and ASA. In the ED, his work up was notable for Cr 2.1, Hgb 9.5, TSH 4.922, Hgb A1C 5.3%. He underwent an MRI brain 08/05/22 which revealed multiple small acute infarcts involving the L basal ganglia, L frontal, parietal, and occipital cortex, and adjacent white matter with slight edema but no mass effect. He was admitted and underwent MRA head on 08/06/22 which showed intracranial atherosclerotic disease with associated moderate stenosis at the L carotid siphon with additional severe distal L P3 stenosis. Vascular surgery was consulted, he underwent CTA neck on 08/08/22 which showed proximal L ICA stenosis 75% and 65% on right, and he subsequently underwent L transcarotid artery revascularization on 08/12/22 with Dr. Donzetta Matters. Lipid panel done on 08/06/22 showing LDL 69, cholesterol 143, started on Crestor 10mg  QD during hospitalization.  2D echo performed 08/06/22 and showed preserved EF 82-50% but LV diastolic parameters c/w grade I diastolic dysfunction, trivial MV regurg without stenosis, mild calcification and thickening of aortic valve, no evidence of IA shunt. He was resumed on his home daily ASA 81mg , and started on Plavix 75mg  QD on 08/08/22 which was recommended to be continued for 3 weeks and then go on Plavix alone.  He remained stable during his hospitalization, with improving kidney function back to baseline. CIR was recommended due to functional decline.   At baseline prior to admission, he ambulates with a walker  ROS Past Medical History:  Diagnosis Date   CKD (chronic kidney disease) stage 3, GFR 30-59 ml/min (HCC)     Gait abnormality    Hypertension    Pancreatitis    Seizures (HCC)    Venous insufficiency    Past Surgical History:  Procedure Laterality Date   HERNIA REPAIR     TRANSCAROTID ARTERY REVASCULARIZATION  Left 08/12/2022   Procedure: Transcarotid Artery Revascularization;  Surgeon: Waynetta Sandy, MD;  Location: [facility];  Service: Vascular;  Laterality: Left;   Family History  Problem Relation Age of Onset   Hypertension Other    Diabetes Other    Asthma Other    Social History:  reports that he has been smoking cigars. He has never used smokeless tobacco. He reports that he does not drink alcohol and does not use drugs. Allergies: No Known Allergies Medications Prior to Admission  Medication Sig Dispense Refill   amLODipine (NORVASC) 5 MG tablet Take 5 mg by mouth daily.     aspirin EC 81 MG tablet Take 81 mg by mouth daily. Swallow whole.     levETIRAcetam (KEPPRA) 500 MG tablet Take 1 tablet (500 mg total) by mouth 2 (two) times daily. 60 tablet 3   metoprolol tartrate (LOPRESSOR) 50 MG tablet Take 50 mg by mouth 2 (two) times daily.  4   multivitamin (ONE-A-DAY MEN'S) TABS tablet Take 1 tablet by mouth daily.     pantoprazole (PROTONIX) 40 MG tablet Take 1 tablet (40 mg total) by mouth daily. 30 tablet 3      Home:     Functional History:    Functional Status:  Mobility:          ADL:  Cognition: Cognition Orientation Level: Oriented to person, Oriented to place, Oriented to situation    Physical Exam: Blood pressure 129/66, pulse (!) 57, temperature 98.2 F (36.8 C), temperature source Oral, resp. rate 16, weight 53.3 kg, SpO2 99 %. Physical Exam  Results for orders placed or performed during the hospital encounter of 08/05/22 (from the past 48 hour(s))  CBC     Status: Abnormal   Collection Time: 08/13/22  4:05 AM  Result Value Ref Range   WBC 9.3 4.0 - 10.5 K/uL   RBC 2.73 (L) 4.22 - 5.81 MIL/uL   Hemoglobin 8.0 (L) 13.0 - 17.0 g/dL    HCT 23.5 (L) 39.0 - 52.0 %   MCV 86.1 80.0 - 100.0 fL   MCH 29.3 26.0 - 34.0 pg   MCHC 34.0 30.0 - 36.0 g/dL   RDW 12.4 11.5 - 15.5 %   Platelets 182 150 - 400 K/uL   nRBC 0.0 0.0 - 0.2 %    Comment: Performed at [facility], [address] [place], [place] [address]  Basic metabolic panel     Status: Abnormal   Collection Time: 08/13/22  4:05 AM  Result Value Ref Range   Sodium 135 135 - 145 mmol/L   Potassium 4.2 3.5 - 5.1 mmol/L   Chloride 102 98 - 111 mmol/L   CO2 21 (L) 22 - 32 mmol/L   Glucose, Bld 92 70 - 99 mg/dL    Comment: Glucose reference range applies only to samples taken after fasting for at least 8 hours.   BUN 27 (H) 8 - 23 mg/dL   Creatinine, Ser 1.57 (H) 0.61 - 1.24 mg/dL   Calcium 8.5 (L) 8.9 - 10.3 mg/dL   GFR, Estimated 42 (L) >60 mL/min    Comment: (NOTE) Calculated using the CKD-EPI Creatinine Equation (2021)    Anion gap 12 5 - 15    Comment: Performed at [facility] [address] [place], [place] [address]   No results found.    Blood pressure 129/66, pulse (!) 57, temperature 98.2 F (36.8 C), temperature source Oral, resp. rate 16, weight 53.3 kg, SpO2 99 %.  Medical Problem List and Plan: 1. Functional deficits secondary to ***  -patient may *** shower  -ELOS/Goals: *** 2.  Antithrombotics: -DVT/anticoagulation: ***  -antiplatelet therapy: DAPT ASA 81mg  QD + Plavix 75mg  QD x3 weeks then Plavix alone (~08/29/22) 3. Pain Management: *** 4. Mood/Behavior/Sleep: ***  -antipsychotic agents: *** 5. Neuropsych/cognition: This patient *** capable of making decisions on *** own behalf. 6. Skin/Wound Care: *** 7. Fluids/Electrolytes/Nutrition: *** 8. L internal Carotid Artery stenosis s/p TCAR 08/12/22, continue ASA/Plavix/Crestor  -f/up in 4 weeks with carotid duplex with Dr. Donzetta Matters 9. Seizures: continue Keppra 500mg  BID 10. HTN: continue Norvasc 5mg  QD, held home Metoprolol 50mg  BID during hospitalization but restarted at 12.5mg   BID -Per neurology, allowed for permissive HTN (ok if <220/120) but gradually normalize by 08/14/22, long term normotensive goal 11. Acute on Chronic CKD stage 3a: baseline Cr ~1.2, peaked at 2.1, renal U/S 08/06/22 showing chronic medical renal disease but no hydronephrosis  -Avoid nephrotoxic agents, monitor UOP, encourage PO intake 12. Chronic normocytic anemia: baseline hgb 9-10, monitor CBC  -Hgb down to 8.2 after TCAR, stable at 8.0 on 08/13/22 13. Abnormal TSH: TSH 4.9 on admission 08/05/22, free T4 0.88*** 14. Hyponatremia: improved during hospitalization with hydration, monitor BMP    ***  [address] PA-C 08/14/2022

## 2022-08-14 NOTE — TOC Transition Note (Signed)
Transition of Care [facility]) - CM/SW Discharge Note   Patient Details  Name: Carlos Shields MRN: [id number] Date of Birth: 01/23/1931  [facility]) CM/SW Contact:  Tom-Johnson, Renea Ee, RN Phone Number: 08/14/2022, 12:37 PM   Clinical Narrative:     Patient is scheduled for discharge to CIR today. No further TOC needs noted.        Final next level of care: IP Rehab Facility Barriers to Discharge: Barriers Resolved   Patient Goals and CMS Choice CMS Medicare.gov Compare Post Acute Care list provided to:: Patient Choice offered to / list presented to : Patient, Adult Children  Discharge Placement                  Patient to be transferred to facility by: In-hospital Transfer      Discharge Plan and Services Additional resources added to the After Visit Summary for                  DME Arranged: N/A DME Agency: NA       HH Arranged: NA HH Agency: NA        Social Determinants of Health (SDOH) Interventions SDOH Screenings   Food Insecurity: No Food Insecurity (08/06/2022)  Housing: Low Risk  (08/06/2022)  Transportation Needs: No Transportation Needs (08/06/2022)  Utilities: Not At Risk (08/06/2022)  Tobacco Use: High Risk (08/13/2022)     Readmission Risk Interventions     No data to display

## 2022-08-14 NOTE — Progress Notes (Signed)
PMR Admission Coordinator Pre-Admission Assessment   Patient: Carlos Shields is an 89 y.o., male MRN: [id number] DOB: 15-Apr-1931 Height: 5\' 4"  (162.6 cm) Weight: 51.7 kg   Insurance Information HMO: yes    PPO:      PCP:      IPA:      80/20:      OTHER:  PRIMARY: U[employer] Medicare      Policy#: [id number]      Subscriber: patient CM Name:       Phone#:  [phone] option #7    Fax#: [phone] Pre-Cert#: [id number]      Employer: Karleen Hampshire with [employer] called 2/2 with authorization for admission 08/13/22-08/19/22 with updates due 08/19/22 Benefits:  Phone #: [phone]     Name: 1/30 Eff. Date: 07/12/22     Deduct: none      Out of Pocket Max: $3600      Life Max: none CIR: $295 co pay per day days 1 until 5      SNF: no co pay per day days 1 until 20; $203 co pay per day days 21 until 100 Outpatient: $20 per visit     Co-Pay: visits per medical neccesity Home Health: 100%      Co-Pay: visits per medical neccesity DME: 80%     Co-Pay: 20% Providers: in-network  SECONDARY:       Policy#:      Phone#:    Development worker, community:       Phone#:    The Engineer, petroleum" for patients in Inpatient Rehabilitation Facilities with attached "Privacy Act [place] Records" was provided and verbally reviewed with: Patient and Family   Emergency Contact Information Contact Information       Name Relation Home Work Mobile    Odem,[name] Daughter [phone]   [phone]         Current Medical History  Patient Admitting Diagnosis: CVA   History of Present Illness:  89 year old male with medical hx significant for: seizure disorder, h/o CVA, CKD stage IIIa, dysphagia, HTN. Pt presented to [facility] on 08/05/22 due to right-sided weakness and difficulty walking. Had missed a few doses of his Keppra and Asa last week, pt's son also died the previous week.Code stroke activated. Pt was not a candidate for tPA. CT head negative for acute abnormalities. MRI showed multiple  acute strokes involving left basal ganglia, left frontal, parietal and occipital cortex and adjacent white matter. MRA negative for LVO. Pt transferred to [facility] on 08/06/22.    Due to concern for possible breakthrough seizure, patient received additional IV Keppra and seizure precautions.  TO allow for initial permissive HTN. ON low dose Coreg currently and will up titrate as BP tolerates. Lovenox for CVTS prophylaxis.    MRA head and neck significant for intracranial atherosclerotic disease with associated moderate stenosis at the left carotid siphon, with additional severe distal left P3 stenosis. Vascular surgery consulted and plan for intervention 2/1.   Left TCAR on 2/1. No postoperative complications.   Complete NIHSS TOTAL: 0   Patient's medical record [facility] has been reviewed by the rehabilitation admission coordinator and physician.   Past Medical History      Past Medical History:  Diagnosis Date   CKD (chronic kidney disease) stage 3, GFR 30-59 ml/min (HCC)     Gait abnormality     Hypertension     Pancreatitis     Seizures ([place])  Venous insufficiency      Has the patient had major surgery during 100 days prior to admission? yes   Family History   family history includes Asthma in an other family member; Diabetes in an other family member; Hypertension in an other family member.   Current Medications   Current Facility-Administered Medications:    0.9 %  sodium chloride infusion (Manually program via Guardrails IV Fluids), , Intravenous, Once, Rhyne, Samantha J, PA-C   0.9 %  sodium chloride infusion, 500 mL, Intravenous, Once PRN, Rhyne, Samantha J, PA-C   0.9 %  sodium chloride infusion, , Intravenous, Continuous, Rhyne, Samantha J, PA-C, Last Rate: 50 mL/hr at 08/12/22 1326, New Bag at 08/12/22 1326   acetaminophen (TYLENOL) tablet 650 mg, 650 mg, Oral, Q4H PRN **OR** acetaminophen (TYLENOL) 160 MG/5ML solution  650 mg, 650 mg, Per Tube, Q4H PRN **OR** acetaminophen (TYLENOL) suppository 650 mg, 650 mg, Rectal, Q4H PRN, Rhyne, Samantha J, PA-C   alum & mag hydroxide-simeth (MAALOX/MYLANTA) 200-200-20 MG/5ML suspension 15-30 mL, 15-30 mL, Oral, Q2H PRN, Rhyne, Samantha J, PA-C   aspirin EC tablet 81 mg, 81 mg, Oral, Daily, Rhyne, Samantha J, PA-C, 81 mg at 08/13/22 3536   clopidogrel (PLAVIX) tablet 75 mg, 75 mg, Oral, Daily, Rhyne, Samantha J, PA-C, 75 mg at 08/13/22 0826   enoxaparin (LOVENOX) injection 30 mg, 30 mg, Subcutaneous, Q24H, Rhyne, Samantha J, PA-C, 30 mg at 08/13/22 0826   guaiFENesin-dextromethorphan (ROBITUSSIN DM) 100-10 MG/5ML syrup 15 mL, 15 mL, Oral, Q4H PRN, Rhyne, Samantha J, PA-C   hydrALAZINE (APRESOLINE) injection 5 mg, 5 mg, Intravenous, Q20 Min PRN, Rhyne, Samantha J, PA-C   HYDROmorphone (DILAUDID) injection 0.5 mg, 0.5 mg, Intravenous, Q4H PRN, Rhyne, Samantha J, PA-C   labetalol (NORMODYNE) injection 10 mg, 10 mg, Intravenous, Q10 min PRN, Rhyne, Samantha J, PA-C   levETIRAcetam (KEPPRA) tablet 500 mg, 500 mg, Oral, BID, Rhyne, Samantha J, PA-C, 500 mg at 08/13/22 1443   magnesium sulfate IVPB 2 g 50 mL, 2 g, Intravenous, Daily PRN, Rhyne, Samantha J, PA-C   metoprolol tartrate (LOPRESSOR) injection 2-5 mg, 2-5 mg, Intravenous, Q2H PRN, Rhyne, Samantha J, PA-C   metoprolol tartrate (LOPRESSOR) tablet 12.5 mg, 12.5 mg, Oral, BID, Rhyne, Samantha J, PA-C, 12.5 mg at 08/13/22 0827   ondansetron (ZOFRAN) injection 4 mg, 4 mg, Intravenous, Q6H PRN, Rhyne, Samantha J, PA-C   oxyCODONE (Oxy IR/ROXICODONE) immediate release tablet 5 mg, 5 mg, Oral, Q4H PRN, Rhyne, Samantha J, PA-C   pantoprazole (PROTONIX) EC tablet 40 mg, 40 mg, Oral, Daily, Rhyne, Samantha J, PA-C, 40 mg at 08/13/22 0827   phenol (CHLORASEPTIC) mouth spray 1 spray, 1 spray, Mouth/Throat, PRN, Rhyne, Samantha J, PA-C   potassium chloride SA (KLOR-CON M) CR tablet 20-40 mEq, 20-40 mEq, Oral, Daily PRN, Rhyne, Samantha  J, PA-C   rosuvastatin (CRESTOR) tablet 10 mg, 10 mg, Oral, Daily, Rhyne, Samantha J, PA-C, 10 mg at 08/13/22 1540   Patients Current Diet:  Diet Order                  Diet Heart Room service appropriate? Yes with Assist; Fluid consistency: Thin  Diet effective now                       Precautions / Restrictions Precautions Precautions: Fall, Other (comment) Precaution Comments: incontinent Restrictions Weight Bearing Restrictions: No    Has the patient had 2 or more falls or a fall with injury in the past  year? Yes   Prior Activity Level Limited Community (1-2x/wk): doctor's appointments   Prior Functional Level Self Care: Did the patient need help bathing, dressing, using the toilet or eating? Independent   Indoor Mobility: Did the patient need assistance with walking from room to room (with or without device)? Independent   Stairs: Did the patient need assistance with internal or external stairs (with or without device)? Independent   Functional Cognition: Did the patient need help planning regular tasks such as shopping or remembering to take medications? Independent   Patient Information Are you of Hispanic, Latino/a,or Spanish origin?: A. No, not of Hispanic, Latino/a, or Spanish origin What is your race?: B. Black or African American Do you need or want an interpreter to communicate with a doctor or health care staff?: 0. No   Patient's Response To:  Health Literacy and Transportation Is the patient able to respond to health literacy and transportation needs?: Yes Health Literacy - How often do you need to have someone help you when you read instructions, pamphlets, or other written material from your doctor or pharmacy?: Often In the past 12 months, has lack of transportation kept you from medical appointments or from getting medications?: No In the past 12 months, has lack of transportation kept you from meetings, work, or from getting things needed for daily  living?: No   Home Assistive Devices / [place] Devices/Equipment: Environmental consultant (specify type) Home Equipment: Tub bench, Rolling Walker (2 wheels), Cane - single point, [employer] - quad, BSC/3in1   Prior Device Use: Indicate devices/aids used by the patient prior to current illness, exacerbation or injury? Walker   Current Functional Level Cognition   Arousal/Alertness: Awake/alert Overall Cognitive Status: No family/caregiver present to determine baseline cognitive functioning Current Attention Level: Sustained Orientation Level: Oriented to person, Oriented to place, Oriented to situation Following Commands: Follows one step commands with increased time Safety/Judgement: Decreased awareness of deficits, Decreased awareness of safety General Comments: Pt oriented to place and situation but not month or year when asked.  Increased time needed with mod instructional cueing for sequencing oral hygiene at the sink. Attention: Sustained Sustained Attention: Appears intact Memory: Impaired Memory Impairment: Retrieval deficit Awareness: Impaired Awareness Impairment: Emergent impairment Problem Solving: Impaired Problem Solving Impairment: Verbal basic    Extremity Assessment (includes Sensation/Coordination)   Upper Extremity Assessment: RUE deficits/detail RUE Deficits / Details: decreased AROM in RUE, theraband and squeeze ball provided to promote increased strength in RUE RUE Sensation: WNL RUE Coordination: decreased fine motor, decreased gross motor  Lower Extremity Assessment: Defer to PT evaluation RLE Deficits / Details: ROM WFL; MMT 4+/5 throughout RLE Sensation: WNL RLE Coordination: decreased gross motor, decreased fine motor (decreased coordination with heel shin and foot taps) LLE Deficits / Details: ROM WFL; MMT 5/5 throughout LLE Sensation: WNL LLE Coordination: WNL     ADLs   Overall ADL's : Needs assistance/impaired Eating/Feeding: Set up,  Sitting Grooming: Oral care, Minimal assistance, Standing Upper Body Bathing: Minimal assistance, Sitting Lower Body Bathing: Sit to/from stand, Moderate assistance Upper Body Dressing : Sitting, Minimal assistance Lower Body Dressing: Sit to/from stand, Maximal assistance Toilet Transfer: Moderate assistance, Stand-pivot, BSC/3in1, Ambulation, Rolling walker (2 wheels) Toilet Transfer Details (indicate cue type and reason): simulated as pt with condom cath and denies need to have BM Toileting- Clothing Manipulation and Hygiene: Moderate assistance, Sit to/from stand Toileting - Clothing Manipulation Details (indicate cue type and reason): for clothing Functional mobility during ADLs: Minimal assistance, Rolling walker (2 wheels)  General ADL Comments: Pt with decreased awareness of deficits from CVAs when asked.  He needed increased time for processing and sequencing oral hygiene at the sink.  Mod instructional and questioning cueing for taking toothbrush out of wrapper that therapist had already opened and for applying toothpaste.  Increased ataxia and decreased coordination noted in the RUE compared to the left when attempting to manipulate and open the toothpaste as well as when brushing.  Uses the RUE at an active assist level with increased time during session.     Mobility   Overal bed mobility: Needs Assistance Bed Mobility: Supine to Sit Supine to sit: Mod assist Sit to supine: Mod assist General bed mobility comments: Assist for bringing LEs off of the bed and scooting to the edge as well as support for lifting trunk up to sitting.  Mod assist for lifting LEs back in the bed and centering trunk.     Transfers   Overall transfer level: Needs assistance Equipment used: Rolling walker (2 wheels) Transfers: Sit to/from Stand Sit to Stand: Mod assist Bed to/from chair/wheelchair/BSC transfer type:: Step pivot Stand pivot transfers: Mod assist Step pivot transfers: Mod assist General  transfer comment: Mod assist for sit to stand from the EOB with mod demonstrational cueing for hand placement.  Increased posterior lean with initial standing.     Ambulation / Gait / Stairs / Wheelchair Mobility   Ambulation/Gait Ambulation/Gait assistance: Min assist, Mod assist Gait Distance (Feet): 115 Feet Assistive device: Rolling walker (2 wheels) Gait Pattern/deviations: Decreased stride length, Narrow base of support, Trunk flexed, Leaning posteriorly, Decreased step length - right, Decreased step length - left, Shuffle General Gait Details: verbal cues for wider BOS and increased stride length, in addition to walker proximity and upright posture, mod a to maintain balance and facilitate anterior weight shift, moments of light min a, pt unable o coordinate side steps to [facility] this date at end of session Gait velocity: decreased Gait velocity interpretation: <1.8 ft/sec, indicate of risk for recurrent falls     Posture / Balance Dynamic Sitting Balance Sitting balance - Comments: Pt able to maintain static sitting balance EOB in preparation for standing. Balance Overall balance assessment: Needs assistance Sitting-balance support: No upper extremity supported, Feet supported Sitting balance-Leahy Scale: Fair Sitting balance - Comments: Pt able to maintain static sitting balance EOB in preparation for standing. Standing balance support: During functional activity, Single extremity supported Standing balance-Leahy Scale: Poor Standing balance comment: Posterior LOB in standing.  He needs BUE support for functional mobility.     Special needs/care consideration Seizure precautions    Previous Home Environment  Living Arrangements: Children  Lives With: Daughter Available Help at Discharge: Family, Available 24 hours/day Type of Home: House Home Layout: One level Home Access: Stairs to enter Entrance Stairs-Rails: None Entrance Stairs-Number of Steps: 1 Bathroom Shower/Tub:  Associate Professor: Yes How Accessible: Accessible via walker Home Care Services: No Additional Comments: Pt somewhat questionable historian but information mostly agrees with prior admissions   Discharge Living Setting Plans for Discharge Living Setting: House (daughter's house) Type of Home at Discharge: House Discharge Home Layout: One level Discharge Home Access: Stairs to enter Entrance Stairs-Rails: None Entrance Stairs-Number of Steps: 1 Discharge Bathroom Shower/Tub: Tub/shower unit Discharge Bathroom Toilet: Standard Discharge Bathroom Accessibility: Yes How Accessible: Accessible via walker Does the patient have any problems obtaining your medications?: No   Social/Family/Support Systems Anticipated Caregiver: Nicole Cella, daughter Anticipated Industrial/product designer Information: [phone] Caregiver  Availability: 24/7 Discharge Plan Discussed with Primary Caregiver: Yes Is Caregiver In Agreement with Plan?: Yes Does Caregiver/Family have Issues with Lodging/Transportation while Pt is in Rehab?: No   Goals Patient/Family Goal for Rehab: supervision with PT, OT and SLP Expected length of stay: ELOS 10 to 12 days Pt/Family Agrees to Admission and willing to participate: Yes Program Orientation Provided & Reviewed with Pt/Caregiver Including Roles  & Responsibilities: Yes   Decrease burden of Care through IP rehab admission: NA   Possible need for SNF placement upon discharge: Not anticipated   Patient Condition: I have reviewed medical records from [facility], spoken with CM, and patient and daughter. I met with patient at the bedside and discussed via phone for inpatient rehabilitation assessment.  Patient will benefit from ongoing PT, OT, and SLP, can actively participate in 3 hours of therapy a day 5 days of the week, and can make measurable gains during the admission.  Patient will also benefit from the  coordinated team approach during an Inpatient Acute Rehabilitation admission.  The patient will receive intensive therapy as well as Rehabilitation physician, nursing, social worker, and care management interventions.  Due to bladder management, safety, skin/wound care, disease management, medication administration, pain management, and patient education the patient requires 24 hour a day rehabilitation nursing.  The patient is currently Mod A  with mobility and basic ADLs.  Discharge setting and therapy post discharge at home with home health is anticipated.  Patient has agreed to participate in the Acute Inpatient Rehabilitation Program and will admit today.   Preadmission Screen Completed By:  Danne Baxter RN MSN with updates by Clemens Catholic, MS, [place]   08/13/2022 5:00 PM ______________________________________________________________________   Discussed status with Dr. Tressa Busman  on 08/14/22 at 61 and received approval for admission today.   Admission Coordinator:  Danne Baxter RN MSN with updates by Clemens Catholic, MS, CCC-SLP, time 903/Date 2/324    Assessment/Plan: Diagnosis: Does the need for close, 24 hr/day Medical supervision in concert with the patient's rehab needs make it unreasonable for this patient to be served in a less intensive setting? Yes Co-Morbidities requiring supervision/potential complications: Multifocal bilateral CVA, HTN, AKI on CKD, s/p L TCAR, seizures, hypertension, anemia, encephalopathy Due to safety, skin/wound care, disease management, medication administration, and patient education, does the patient require 24 hr/day rehab nursing? Yes Does the patient require coordinated care of a physician, rehab nurse, PT, OT, and SLP to address physical and functional deficits in the context of the above medical diagnosis(es)? Yes Addressing deficits in the following areas: balance, endurance, locomotion, strength, transferring, bathing, dressing, feeding, grooming,  toileting, cognition, and language Can the patient actively participate in an intensive therapy program of at least 3 hrs of therapy 5 days a week? Yes The potential for patient to make measurable gains while on inpatient rehab is good Anticipated functional outcomes upon discharge from inpatient rehab: supervision PT, supervision OT, supervision SLP Estimated rehab length of stay to reach the above functional goals is: 10-12 days Anticipated discharge destination: Home 10. Overall Rehab/Functional Prognosis: good     MD Signature:   Gertie Gowda, DO 08/14/2022

## 2022-08-15 DIAGNOSIS — I6340 Cerebral infarction due to embolism of unspecified cerebral artery: Secondary | ICD-10-CM

## 2022-08-15 LAB — CBC WITH DIFFERENTIAL/PLATELET
Abs Immature Granulocytes: 0.03 10*3/uL (ref 0.00–0.07)
Basophils Absolute: 0.1 10*3/uL (ref 0.0–0.1)
Basophils Relative: 1 %
Eosinophils Absolute: 0.3 10*3/uL (ref 0.0–0.5)
Eosinophils Relative: 5 %
HCT: 25.6 % — ABNORMAL LOW (ref 39.0–52.0)
Hemoglobin: 8.3 g/dL — ABNORMAL LOW (ref 13.0–17.0)
Immature Granulocytes: 1 %
Lymphocytes Relative: 30 %
Lymphs Abs: 1.9 10*3/uL (ref 0.7–4.0)
MCH: 29.1 pg (ref 26.0–34.0)
MCHC: 32.4 g/dL (ref 30.0–36.0)
MCV: 89.8 fL (ref 80.0–100.0)
Monocytes Absolute: 0.7 10*3/uL (ref 0.1–1.0)
Monocytes Relative: 11 %
Neutro Abs: 3.4 10*3/uL (ref 1.7–7.7)
Neutrophils Relative %: 52 %
Platelets: 187 10*3/uL (ref 150–400)
RBC: 2.85 MIL/uL — ABNORMAL LOW (ref 4.22–5.81)
RDW: 12.5 % (ref 11.5–15.5)
WBC: 6.3 10*3/uL (ref 4.0–10.5)
nRBC: 0 % (ref 0.0–0.2)

## 2022-08-15 LAB — COMPREHENSIVE METABOLIC PANEL
ALT: 12 U/L (ref 0–44)
AST: 35 U/L (ref 15–41)
Albumin: 3 g/dL — ABNORMAL LOW (ref 3.5–5.0)
Alkaline Phosphatase: 59 U/L (ref 38–126)
Anion gap: 10 (ref 5–15)
BUN: 32 mg/dL — ABNORMAL HIGH (ref 8–23)
CO2: 18 mmol/L — ABNORMAL LOW (ref 22–32)
Calcium: 8.5 mg/dL — ABNORMAL LOW (ref 8.9–10.3)
Chloride: 104 mmol/L (ref 98–111)
Creatinine, Ser: 1.61 mg/dL — ABNORMAL HIGH (ref 0.61–1.24)
GFR, Estimated: 41 mL/min — ABNORMAL LOW (ref >60)
Glucose, Bld: 92 mg/dL (ref 70–99)
Potassium: 4.4 mmol/L (ref 3.5–5.1)
Sodium: 132 mmol/L — ABNORMAL LOW (ref 135–145)
Total Bilirubin: 0.3 mg/dL (ref 0.3–1.2)
Total Protein: 6.2 g/dL — ABNORMAL LOW (ref 6.5–8.1)

## 2022-08-15 LAB — MAGNESIUM: Magnesium: 1.9 mg/dL (ref 1.7–2.4)

## 2022-08-15 NOTE — Progress Notes (Signed)
Physical Therapy Assessment and Plan  Patient Details  Name: Carlos Shields MRN: [id number] Date of Birth: 1931/06/21  PT Diagnosis: Abnormal posture, Abnormality of gait, Coordination disorder, Difficulty walking, and Hemiplegia dominant Rehab Potential: Good ELOS: 12-14 days   Today's Date: 08/15/2022 PT Individual Time: 8242-3536 PT Individual Time Calculation (min): 60 min    Hospital Problem: Principal Problem:   CVA (cerebrovascular accident) [facility])   Past Medical History:  Past Medical History:  Diagnosis Date   CKD (chronic kidney disease) stage 3, GFR 30-59 ml/min (HCC)    Gait abnormality    Hypertension    Pancreatitis    Seizures (HCC)    Venous insufficiency    Past Surgical History:  Past Surgical History:  Procedure Laterality Date   HERNIA REPAIR     TRANSCAROTID ARTERY REVASCULARIZATION  Left 08/12/2022   Procedure: Transcarotid Artery Revascularization;  Surgeon: Maeola Harman, MD;  Location: [facility] OR;  Service: Vascular;  Laterality: Left;    Assessment & Plan Clinical Impression: Patient is a 89 y.o. year old malewith a PMHx of seizure on Keppra 500mg  BID, CVA, hypertension, CKD stage IIIa, pancreatitis, venous insufficiency, and dysphagia who was admitted on 08/05/22 for right-sided weakness, incontinence, seizure like activity, and fall. He had been out of his Keppra and ASA. In the ED, his work up was notable for Cr 2.1, Hgb 9.5, TSH 4.922, Hgb A1C 5.3%. He underwent an MRI brain 08/05/22 which revealed multiple small acute infarcts involving the L basal ganglia, L frontal, parietal, and occipital cortex, and adjacent white matter with slight edema but no mass effect. He was admitted and underwent MRA head on 08/06/22 which showed intracranial atherosclerotic disease with associated moderate stenosis at the L carotid siphon with additional severe distal L P3 stenosis. Vascular surgery was consulted, he underwent CTA neck on 08/08/22 which showed proximal L ICA  stenosis 75% and 65% on right, and he subsequently underwent L transcarotid artery revascularization on 08/12/22 with Dr. 10/11/22. Lipid panel done on 08/06/22 showing LDL 69, cholesterol 143, started on Crestor 10mg  QD during hospitalization.  2D echo performed 08/06/22 and showed preserved EF 60-65% but LV diastolic parameters c/w grade I diastolic dysfunction, trivial MV regurg without stenosis, mild calcification and thickening of aortic valve, no evidence of IA shunt. He was resumed on his home daily ASA 81mg , and started on Plavix 75mg  QD on 08/08/22 which was recommended to be continued for 3 weeks and then go on Plavix alone.  He remained stable during his hospitalization, with improving kidney function back to baseline. CIR was recommended due to functional decline.   Patient transferred to CIR on 08/14/2022 .   Patient currently requires mod with mobility secondary to muscle weakness, decreased cardiorespiratoy endurance, impaired timing and sequencing, motor apraxia, decreased coordination, and decreased motor planning, decreased motor planning and ideational apraxia, decreased initiation, decreased attention, decreased awareness, decreased problem solving, decreased safety awareness, decreased memory, and delayed processing, and decreased sitting balance, decreased standing balance, decreased postural control, hemiplegia, and decreased balance strategies.  Prior to hospitalization, patient was modified independent  with mobility and lived with Daughter in a House home.  Home access is 1Stairs to enter.  Patient will benefit from skilled PT intervention to maximize safe functional mobility, minimize fall risk, and decrease caregiver burden for planned discharge home with 24 hour supervision.  Anticipate patient will benefit from follow up HH at discharge.  PT - End of Session Activity Tolerance: Tolerates 30+ min activity with multiple rests Endurance  Deficit: Yes Endurance Deficit Description:  decreased PT Assessment Rehab Potential (ACUTE/IP ONLY): Good PT Barriers to Discharge: Incontinence PT Barriers to Discharge Comments: house, 1 level with 1 step to enter with left rail and dtr able to provide 24/7 PT Patient demonstrates impairments in the following area(s): Balance;Perception;Safety;Endurance;Motor PT Transfers Functional Problem(s): Bed Mobility;Bed to Chair;Car PT Locomotion Functional Problem(s): Ambulation;Wheelchair Mobility PT Plan PT Intensity: Minimum of 1-2 x/day ,45 to 90 minutes PT Frequency: 5 out of 7 days PT Duration Estimated Length of Stay: 12-14 days PT Treatment/Interventions: Ambulation/gait training;Cognitive remediation/compensation;Discharge planning;DME/adaptive equipment instruction;Functional mobility training;UE/LE Strength taining/ROM;Therapeutic Activities;Pain management;Psychosocial support;Wheelchair propulsion/positioning;UE/LE Coordination activities;Therapeutic Exercise;Stair training;Patient/family education;Skin care/wound management;Neuromuscular re-education;Functional electrical stimulation;Disease management/prevention;Community reintegration;Balance/vestibular training PT Transfers Anticipated Outcome(s): Supervison PT Locomotion Anticipated Outcome(s): CGA PT Recommendation Recommendations for Other Services: Other (comment) (TBD) Follow Up Recommendations: Home health PT Patient destination: Home Equipment Recommended: To be determined Equipment Details: owns RW   PT Evaluation Precautions/Restrictions Precautions Precautions: Fall;Other (comment) Precaution Comments: incontinent, R hemi, seizure Restrictions Weight Bearing Restrictions: No Pain Interference Pain Interference Pain Effect on Sleep: 0. Does not apply - I have not had any pain or hurting in the past 5 days Pain Interference with Therapy Activities: 0. Does not apply - I have not received rehabilitationtherapy in the past 5 days Pain Interference with  Day-to-Day Activities: 1. Rarely or not at all Home Living/Prior [place] Available Help at Discharge: Family;Available 24 hours/day Type of Home: House Home Access: Stairs to enter [employer] of Steps: 1 Entrance Stairs-Rails: Left (back entrance) Home Layout: One level Bathroom Shower/Tub: Chiropodist: Handicapped height Bathroom Accessibility: Yes  Lives With: Daughter Prior Function Level of Independence: Independent with transfers;Independent with gait  Able to Take Stairs?: Yes Driving: No Vocation: On disability Cognition Overall Cognitive Status: No family/caregiver present to determine baseline cognitive functioning Arousal/Alertness: Awake/alert Orientation Level: Oriented to person;Oriented to place;Oriented to situation Memory: Impaired Awareness: Impaired Problem Solving: Impaired Safety/Judgment: Impaired Sensation Sensation Light Touch: Appears Intact Proprioception: Impaired by gross assessment Additional Comments: posterior bias Coordination Gross Motor Movements are Fluid and Coordinated: No Fine Motor Movements are Fluid and Coordinated: No Coordination and Movement Description: grossly uncoordinated 2/2 right hemi Finger Nose Finger Test: R UE dysmetria Heel Shin Test: R LE dysmetria Motor  Motor Motor: Hemiplegia Motor - Skilled Clinical Observations: limited by R hemi   Trunk/Postural Assessment  Cervical Assessment Cervical Assessment: Exceptions to [facility] (forward head) Thoracic Assessment Thoracic Assessment: Exceptions to [facility] (posterior bias) Lumbar Assessment Lumbar Assessment: Exceptions to [facility] (posterior bias) Postural Control Postural Control: Deficits on evaluation Trunk Control: decreased, posterior bias  Balance Balance Balance Assessed: Yes Static Sitting Balance Static Sitting - Balance Support: Feet supported Static Sitting - Level of Assistance: 5: Stand by assistance  (CGA) Dynamic Sitting Balance Dynamic Sitting - Balance Support: Feet unsupported;During functional activity Dynamic Sitting - Level of Assistance: 5: Stand by assistance (CGA) Dynamic Sitting - Balance Activities: Lateral lean/weight shifting;Forward lean/weight shifting;Reaching for objects Static Standing Balance Static Standing - Balance Support: During functional activity Static Standing - Level of Assistance: 4: Min assist Dynamic Standing Balance Dynamic Standing - Balance Support: During functional activity Dynamic Standing - Level of Assistance: 4: Min assist Dynamic Standing - Balance Activities: Lateral lean/weight shifting;Forward lean/weight shifting;Reaching for objects Extremity Assessment  RUE Assessment RUE Assessment: Exceptions to [facility] Active Range of Motion (AROM) Comments: limited at R [place] General Strength Comments: grossly 2/5 proximally, 3 to 3+/5 distally LUE Assessment LUE Assessment: Within  Functional Limits RLE Assessment RLE Assessment: Exceptions to [facility] General Strength Comments: Grossly 3+/5 LLE Assessment LLE Assessment: Exceptions to [facility] General Strength Comments: Grossly 4/5  Care Tool Care Tool Bed Mobility Roll left and right activity   Roll left and right assist level: Contact Guard/Touching assist    Sit to lying activity   Sit to lying assist level: Minimal Assistance - Patient > 75%    Lying to sitting on side of bed activity   Lying to sitting on side of bed assist level: the ability to move from lying on the back to sitting on the side of the bed with no back support.: Minimal Assistance - Patient > 75%     Care Tool Transfers Sit to stand transfer   Sit to stand assist level: Minimal Assistance - Patient > 75%    Chair/bed transfer   Chair/bed transfer assist level: Minimal Assistance - Patient > 75%     Toilet transfer   Assist Level: Moderate Assistance - Patient 50 - 74%    Car transfer   Car transfer assist level:  Minimal Assistance - Patient > 75%      Care Tool Locomotion Ambulation   Assist level: Minimal Assistance - Patient > 75% Assistive device: Walker-rolling Max distance: 150 ft  Walk 10 feet activity   Assist level: Minimal Assistance - Patient > 75% Assistive device: Walker-rolling   Walk 50 feet with 2 turns activity   Assist level: Minimal Assistance - Patient > 75% Assistive device: Walker-rolling  Walk 150 feet activity   Assist level: Minimal Assistance - Patient > 75% Assistive device: Walker-rolling  Walk 10 feet on uneven surfaces activity   Assist level: Moderate Assistance - Patient - 50 - 74% Assistive device: Walker-rolling  Stairs   Assist level: Moderate Assistance - Patient - 50 - 74% Stairs assistive device: 2 hand rails Max number of stairs: 12 (ft)  Walk up/down 1 step activity   Walk up/down 1 step (curb) assist level: Moderate Assistance - Patient - 50 - 74% Walk up/down 1 step or curb assistive device: 2 hand rails  Walk up/down 4 steps activity   Walk up/down 4 steps assist level: Moderate Assistance - Patient - 50 - 74% Walk up/down 4 steps assistive device: 2 hand rails  Walk up/down 12 steps activity   Walk up/down 12 steps assist level: Moderate Assistance - Patient - 50 - 74% Walk up/down 12 steps assistive device: 2 hand rails  Pick up small objects from floor   Pick up small object from the floor assist level: Total Assistance - Patient < 25%    Wheelchair Is the patient using a wheelchair?: Yes Type of Wheelchair: Manual   Wheelchair assist level: Maximal Assistance - Patient 25 - 49% Max wheelchair distance: 5 ft  Wheel 50 feet with 2 turns activity   Assist Level: Total Assistance - Patient < 25%  Wheel 150 feet activity   Assist Level: Total Assistance - Patient < 25%    Refer to Care Plan for Long Term Goals  SHORT TERM GOAL WEEK 1 PT Short Term Goal 1 (Week 1): Pt will require CGA for dynamic standing balance with LRAD PT Short  Term Goal 2 (Week 1): Pt will require CGA with gait x 50 ft with LRAD PT Short Term Goal 3 (Week 1): Pt will require min A for stair navigation x 4 with 2 HR's  Recommendations for other services: None   Skilled Therapeutic Intervention Mobility Bed Mobility Bed  Mobility: Rolling Right;Rolling Left;Supine to Sit;Sit to Supine Rolling Right: Contact Guard/Touching assist Rolling Left: Contact Guard/Touching assist Left Sidelying to Sit: Moderate Assistance - Patient 50-74% Supine to Sit: Contact Guard/Touching assist Sit to Supine: Minimal Assistance - Patient > 75% Transfers Transfers: Sit to Stand;Stand to Sit;Stand Pivot Transfers Sit to Stand: Minimal Assistance - Patient > 75% Stand to Sit: Minimal Assistance - Patient > 75% Stand Pivot Transfers: Minimal Assistance - Patient > 75% Stand Pivot Transfer Details: Verbal cues for technique;Verbal cues for precautions/safety;Visual cues for safe use of DME/AE;Verbal cues for sequencing;Verbal cues for safe use of DME/AE Transfer (Assistive device): Rolling walker Locomotion  Gait Ambulation: Yes Gait Assistance: Minimal Assistance - Patient > 75% Gait Distance (Feet): 150 Feet Assistive device: Rolling walker Gait Assistance Details: Verbal cues for gait pattern;Visual cues for safe use of DME/AE Gait Gait: Yes Gait Pattern: Impaired Gait Pattern: Decreased stance time - left;Shuffle;Narrow base of support Gait velocity: decreased Stairs / Additional Locomotion Stairs: Yes Stairs Assistance: Moderate Assistance - Patient 50 - 74% Stair Management Technique: Two rails Number of Stairs: 12 Height of Stairs: 6 Ramp: Moderate Assistance - Patient 50 - 74% Curb: Moderate Assistance - Patient 50 - 74% Wheelchair Mobility Wheelchair Mobility: Yes Wheelchair Assistance: Maximal Assistance - Patient 25 - 49% Wheelchair Propulsion: Both upper extremities;Both lower extermities Wheelchair Parts Management: Needs  assistance Distance: 5 ft  Daily Treatment Session:   Pt received semi-reclined in bed, agreeable to PT session and A&O x 4. Pt declines pain and oriented to CIR policies/procedures.   PT assessed pain interference, sensation, coordination, strength and balance. Pt requires CGA with rolling and supine to sit and min A for transfers and gait x 150 ft with RW in session. Pt presents with shuffle gait pattern, narrow base of support and freezing in motor sequencing, DO notified. Pt requires mod A for stair negotaition x 12 with 2 HR's and presents with significant posterior bias, unable to correct with verbal cues. Pt requires mod A for ramp negotiation and transported to room and performed sit to supine with min A.   Pt left semi-reclined in bed with all needs in reach and daughter present. Safety plan updated.   Discharge Criteria: Patient will be discharged from PT if patient refuses treatment 3 consecutive times without medical reason, if treatment goals not met, if there is a change in medical status, if patient makes no progress towards goals or if patient is discharged from hospital.  The above assessment, treatment plan, treatment alternatives and goals were discussed and mutually agreed upon: by patient and by family  Tanja Port PT, DPT  08/15/2022, 12:49 PM

## 2022-08-15 NOTE — Progress Notes (Signed)
PROGRESS NOTE   Subjective/Complaints:  No acute events overnight. No complaints this AM. PT notes some gait freezing, otherwise doing well.   LBM 2/3  ROS: Denies fevers, chills, N/V, abdominal pain, constipation, diarrhea, SOB, cough, chest pain, new weakness or paraesthesias.    Objective:   No results found. Recent Labs    08/13/22 0405 08/15/22 0647  WBC 9.3 6.3  HGB 8.0* 8.3*  HCT 23.5* 25.6*  PLT 182 187   Recent Labs    08/14/22 1522 08/15/22 0647  NA 134* 132*  K 4.2 4.4  CL 103 104  CO2 22 18*  GLUCOSE 97 92  BUN 28* 32*  CREATININE 1.64* 1.61*  CALCIUM 8.3* 8.5*    Intake/Output Summary (Last 24 hours) at 08/15/2022 2028 Last data filed at 08/15/2022 1842 Gross per 24 hour  Intake 460 ml  Output 300 ml  Net 160 ml        Physical Exam: Vital Signs Blood pressure 116/62, pulse 78, temperature 98.3 F (36.8 C), resp. rate 18, height 5\' 4"  (1.626 m), weight 53.3 kg, SpO2 98 %.  Constitutional: No apparent distress. Appropriate appearance for age.  Eyes: PERRLA. EOMI. Visual fields grossly intact.  Cardiovascular: RRR, no murmurs/rub/gallops. No Edema. Peripheral pulses 2+  Respiratory: CTAB. No rales, rhonchi, or wheezing. On RA.  Abdomen: + bowel sounds, normoactive. No distention or tenderness.  GU: +condom catheter, draining clear urine Skin: TCAR site L neck c.d.I, dermabonded MSK:      No apparent deformity.      Strength: 5/5 throughout LUE and LLE; 4/5 throughout RUE and RLE  Ambulating with RW Min A, with some motor initiation delay/freezing   Neurologic exam:  Cognition: AAO to person, place; not time.  + memory deficits Insight: Good insight into current condition.  Mood: Pleasant affect, appropriate mood.  Sensation: To light touch intact in BL UEs and LEs  Coordination: + RUE ataxia; ?RLE apraxia Spasticity: None noted today    Assessment/Plan: 1. Functional deficits  which require 3+ hours per day of interdisciplinary therapy in a comprehensive inpatient rehab setting. Physiatrist is providing close team supervision and 24 hour management of active medical problems listed below. Physiatrist and rehab team continue to assess barriers to discharge/monitor patient progress toward functional and medical goals  Care Tool:  Bathing    Body parts bathed by patient: Right arm, Left arm, Chest, Abdomen, Right upper leg, Left upper leg, Face, Front perineal area   Body parts bathed by helper: Buttocks, Right lower leg, Left lower leg     Bathing assist Assist Level: Moderate Assistance - Patient 50 - 74%     Upper Body Dressing/Undressing Upper body dressing   What is the patient wearing?: Pull over shirt    Upper body assist Assist Level: Minimal Assistance - Patient > 75%    Lower Body Dressing/Undressing Lower body dressing      What is the patient wearing?: Pants     Lower body assist Assist for lower body dressing: Moderate Assistance - Patient 50 - 74%     Toileting Toileting    Toileting assist Assist for toileting: Moderate Assistance - Patient 50 - 74%  Transfers Chair/bed transfer  Transfers assist     Chair/bed transfer assist level: Minimal Assistance - Patient > 75%     Locomotion Ambulation   Ambulation assist      Assist level: Minimal Assistance - Patient > 75% Assistive device: Walker-rolling Max distance: 150 ft   Walk 10 feet activity   Assist     Assist level: Minimal Assistance - Patient > 75% Assistive device: Walker-rolling   Walk 50 feet activity   Assist    Assist level: Minimal Assistance - Patient > 75% Assistive device: Walker-rolling    Walk 150 feet activity   Assist    Assist level: Minimal Assistance - Patient > 75% Assistive device: Walker-rolling    Walk 10 feet on uneven surface  activity   Assist     Assist level: Moderate Assistance - Patient - 50 -  74% Assistive device: Aeronautical engineer Is the patient using a wheelchair?: Yes Type of Wheelchair: Manual    Wheelchair assist level: Maximal Assistance - Patient 25 - 49% Max wheelchair distance: 5 ft    Wheelchair 50 feet with 2 turns activity    Assist        Assist Level: Total Assistance - Patient < 25%   Wheelchair 150 feet activity     Assist      Assist Level: Total Assistance - Patient < 25%   Blood pressure 116/62, pulse 78, temperature 98.3 F (36.8 C), resp. rate 18, height 5\' 4"  (1.626 m), weight 53.3 kg, SpO2 98 %.  Medical Problem List and Plan: 1. Functional deficits secondary to multiple small acute ischemic CVA infarcts involving the left basal ganglia, left frontal, right occipital cortex and adjacent white matter              -patient may shower             -ELOS/Goals: 10-12 days, supervision PT, supervision OT, supervision SLP  2.  Antithrombotics: -DVT/anticoagulation: Lovenox 30 mg daily  -antiplatelet therapy: DAPT ASA 81mg  QD + Plavix 75mg  QD x3 weeks then Plavix alone (~08/29/22) 3. Pain Management: tylenol PRN, oxy 5 mg Q4H PRN.  4. Mood/Behavior/Sleep: Trazodone 25-50 mg PRN             -antipsychotic agents: none 5. Neuropsych/cognition: This patient is capable of making decisions on his own behalf. 6. Skin/Wound Care: Standard skin assessments, wound care.   - Would encourage removal of condom catheter to avoid skin breakdown  7. Fluids/Electrolytes/Nutrition: Heart healthy diet, thins. Labs in AM.  - Admission labs stable, mild decrease in NA  8. L internal Carotid Artery stenosis s/p TCAR 08/12/22, continue ASA/Plavix/Crestor             -f/up in 4 weeks with carotid duplex with Dr. Donzetta Matters  9. Seizures: continue Keppra 500mg  BID 10. HTN: continue Norvasc 5mg  QD, held home Metoprolol 50mg  BID during hospitalization but restarted at 12.5mg  BID -Per neurology, allowed for permissive HTN (ok if <220/120) but  gradually normalize by 08/14/22, long term normotensive goal    08/15/2022    7:52 PM 08/15/2022    3:37 PM 08/15/2022    4:43 AM  Vitals with BMI  Systolic 606 301 601  Diastolic 62 74 58  Pulse 78 58 62   - 2/4: normotensive; continue current regimen   11. Acute on Chronic CKD stage 3a: baseline Cr ~1.2, peaked at 2.1, renal U/S 08/06/22 showing chronic medical renal disease but no hydronephrosis             -  Avoid nephrotoxic agents, monitor UOP, encourage PO intake  - 2/4 labs: Cr 1.6; stable  12. Chronic normocytic anemia: baseline hgb 9-10, monitor CBC             -Hgb down to 8.2 after TCAR, stable at 8.0 on 08/13/22; 8.3 2/4 13. Abnormal TSH: TSH 4.9 on admission 08/05/22, free T4 0.88; WNL.    - F/u with PCP 14. Hyponatremia: improved during hospitalization with hydration, monitor BMP   - Decreased 134 -> 132 2/4; repeat lads QMonday    LOS: 1 days A FACE TO [place] 08/15/2022, 8:28 PM

## 2022-08-15 NOTE — Evaluation (Signed)
Occupational Therapy Assessment and Plan  Patient Details  Name: Carlos Shields MRN: [id number] Date of Birth: 12-24-1930  OT Diagnosis: abnormal posture, altered mental status, ataxia, cognitive deficits, hemiplegia affecting dominant side, and muscle weakness (generalized) Rehab Potential:   ELOS: 10-12 days   Today's Date: 08/15/2022 OT Individual Time: 1937-9024 OT Individual Time Calculation (min): 74 min     Hospital Problem: Principal Problem:   CVA (cerebrovascular accident) [facility])   Past Medical History:  Past Medical History:  Diagnosis Date   CKD (chronic kidney disease) stage 3, GFR 30-59 ml/min (HCC)    Gait abnormality    Hypertension    Pancreatitis    Seizures ([place])    Venous insufficiency    Past Surgical History:  Past Surgical History:  Procedure Laterality Date   HERNIA REPAIR     TRANSCAROTID ARTERY REVASCULARIZATION  Left 08/12/2022   Procedure: Transcarotid Artery Revascularization;  Surgeon: Waynetta Sandy, MD;  Location: [place];  Service: Vascular;  Laterality: Left;    Assessment & Plan Clinical Impression: Carlos Shields is a 89 y.o. male with a PMHx of seizure on Keppra 500mg  BID, CVA, hypertension, CKD stage IIIa, pancreatitis, venous insufficiency, and dysphagia who was admitted on 08/05/22 for right-sided weakness, incontinence, seizure like activity, and fall. He had been out of his Keppra and ASA. In the ED, his work up was notable for Cr 2.1, Hgb 9.5, TSH 4.922, Hgb A1C 5.3%. He underwent an MRI brain 08/05/22 which revealed multiple small acute infarcts involving the L basal ganglia, L frontal, parietal, and occipital cortex, and adjacent white matter with slight edema but no mass effect. He was admitted and underwent MRA head on 08/06/22 which showed intracranial atherosclerotic disease with associated moderate stenosis at the L carotid siphon with additional severe distal L P3 stenosis. Vascular surgery was consulted, he underwent CTA neck on 08/08/22  which showed proximal L ICA stenosis 75% and 65% on right, and he subsequently underwent L transcarotid artery revascularization on 08/12/22 with Dr. Donzetta Matters. Lipid panel done on 08/06/22 showing LDL 69, cholesterol 143, started on Crestor 10mg  QD during hospitalization.  2D echo performed 08/06/22 and showed preserved EF 09-73% but LV diastolic parameters c/w grade I diastolic dysfunction, trivial MV regurg without stenosis, mild calcification and thickening of aortic valve, no evidence of IA shunt. He was resumed on his home daily ASA 81mg , and started on Plavix 75mg  QD on 08/08/22 which was recommended to be continued for 3 weeks and then go on Plavix alone.  He remained stable during his hospitalization, with improving kidney function back to baseline. CIR was recommended due to functional decline.   Patient currently requires mod with basic self-care skills secondary to muscle weakness, decreased cardiorespiratoy endurance, impaired timing and sequencing, abnormal tone, unbalanced muscle activation, ataxia, decreased coordination, and decreased motor planning, decreased motor planning, and decreased initiation, decreased attention, decreased awareness, decreased problem solving, decreased safety awareness, decreased memory, and delayed processing.  Prior to hospitalization, patient could complete BADL with modified independent .  Patient will benefit from skilled intervention to decrease level of assist with basic self-care skills and increase independence with basic self-care skills prior to discharge home with care partner.  Anticipate patient will require 24 hour supervision and follow up outpatient.  OT - End of Session Activity Tolerance: Tolerates 30+ min activity with multiple rests Endurance Deficit: Yes OT Assessment OT Barriers to Discharge: Incontinence OT Patient demonstrates impairments in the following area(s): Balance;Cognition;Endurance;Motor;Perception;Safety OT Basic ADL's Functional  Problem(s): Grooming;Bathing;Dressing;Toileting  OT Transfers Functional Problem(s): Toilet;Tub/Shower OT Additional Impairment(s): Fuctional Use of Upper Extremity OT Plan OT Intensity: Minimum of 1-2 x/day, 45 to 90 minutes OT Frequency: 5 out of 7 days OT Duration/Estimated Length of Stay: 10-12 days OT Treatment/Interventions: Balance/vestibular training;Functional electrical stimulation;Self Care/advanced ADL retraining;UE/LE Coordination activities;Cognitive remediation/compensation;Functional mobility training;Visual/perceptual remediation/compensation;Community reintegration;Neuromuscular re-education;Wheelchair propulsion/positioning;Splinting/orthotics;Discharge planning;Therapeutic Activities;Disease mangement/prevention;Patient/family education;Therapeutic Exercise;DME/adaptive equipment instruction;UE/LE Strength taining/ROM;Psychosocial support OT Self Feeding Anticipated Outcome(s): Indep OT Basic Self-Care Anticipated Outcome(s): Supervision OT Toileting Anticipated Outcome(s): Supervision OT Bathroom Transfers Anticipated Outcome(s): Supervision OT Recommendation Patient destination: Home Follow Up Recommendations: Outpatient OT Equipment Recommended: To be determined   OT Evaluation Precautions/Restrictions  Precautions Precautions: Fall;Other (comment) Precaution Comments: incontinent, R hemi Restrictions Weight Bearing Restrictions: No Pain Pain Assessment Pain Scale: 0-10 Pain Score: 0-No pain Home Living/Prior Functioning Home Living Family/patient expects to be discharged to:: Private residence Living Arrangements: Children Available Help at Discharge: Family, Available 24 hours/day Type of Home: House Home Access: Stairs to enter Technical brewer of Steps: 1 Entrance Stairs-Rails: None Home Layout: One level Bathroom Shower/Tub: Optometrist: Yes  Lives With: Daughter Prior Function Level of  Independence: Independent with basic ADLs, Independent with transfers, Requires assistive device for independence, Independent with gait Vocation: Retired Surveyor, mining Baseline Vision/History: 0 No visual deficits Ability to See in Adequate Light: 0 Adequate Patient Visual Report: No change from baseline Vision Assessment?: No apparent visual deficits Perception  Perception: Impaired Praxis Praxis: Impaired Praxis Impairment Details: Motor planning Cognition Cognition Overall Cognitive Status: No family/caregiver present to determine baseline cognitive functioning Arousal/Alertness: Awake/alert Orientation Level: Situation;Person;Place Person: Oriented Place: Oriented Situation: Oriented Memory: Impaired Memory Impairment: Retrieval deficit Awareness: Impaired Problem Solving: Impaired Safety/Judgment: Impaired Brief Interview for Mental Status (BIMS) Repetition of Three Words (First Attempt): 3 Temporal Orientation: Year: Missed by more than 5 years Temporal Orientation: Month: Missed by more than 1 month Temporal Orientation: Day: Incorrect Recall: "Sock": No, could not recall Recall: "Blue": No, could not recall Recall: "Bed": No, could not recall BIMS Summary Score: 3 Sensation Sensation Light Touch: Appears Intact Coordination Gross Motor Movements are Fluid and Coordinated: No Fine Motor Movements are Fluid and Coordinated: No Coordination and Movement Description: limited 2/2 R hemi Motor  Motor Motor: Hemiplegia Motor - Skilled Clinical Observations: limited by R hemi  Trunk/Postural Assessment  Cervical Assessment Cervical Assessment: Within Functional Limits Thoracic Assessment Thoracic Assessment: Exceptions to [facility] Lumbar Assessment Lumbar Assessment: Exceptions to [facility] Postural Control Postural Control: Deficits on evaluation  Balance Balance Balance Assessed: Yes Static Sitting Balance Static Sitting - Balance Support: Feet supported Static Sitting -  Level of Assistance: 5: Stand by assistance Dynamic Sitting Balance Dynamic Sitting - Balance Support: Feet supported Dynamic Sitting - Level of Assistance: 4: Min assist Dynamic Sitting - Balance Activities: Lateral lean/weight shifting;Forward lean/weight shifting;Reaching for objects Static Standing Balance Static Standing - Balance Support: During functional activity Static Standing - Level of Assistance: 4: Min assist Dynamic Standing Balance Dynamic Standing - Balance Support: During functional activity Dynamic Standing - Level of Assistance: 3: Mod assist Dynamic Standing - Balance Activities: Lateral lean/weight shifting;Forward lean/weight shifting;Reaching for objects Extremity/Trunk Assessment RUE Assessment RUE Assessment: Exceptions to [facility] Active Range of Motion (AROM) Comments: limited at R hsoulder General Strength Comments: grossly 2/5 proximally, 3 to 3+/5 distally LUE Assessment LUE Assessment: Within Functional Limits  Care Tool Care Tool Self Care Eating   Eating Assist Level: Set up assist    Oral Care    Oral Care  Assist Level: Supervision/Verbal cueing    Bathing   Body parts bathed by patient: Right arm;Left arm;Chest;Abdomen;Right upper leg;Left upper leg;Face;Front perineal area Body parts bathed by helper: Buttocks;Right lower leg;Left lower leg   Assist Level: Moderate Assistance - Patient 50 - 74%    Upper Body Dressing(including orthotics)   What is the patient wearing?: Pull over shirt   Assist Level: Minimal Assistance - Patient > 75%    Lower Body Dressing (excluding footwear)   What is the patient wearing?: Pants Assist for lower body dressing: Moderate Assistance - Patient 50 - 74%    Putting on/Taking off footwear   What is the patient wearing?: Non-skid slipper socks Assist for footwear: Maximal Assistance - Patient 25 - 49%       Care Tool Toileting Toileting activity   Assist for toileting: Moderate Assistance - Patient 50 -  74%     Care Tool Bed Mobility Roll left and right activity    MIN    Sit to lying activity   Sit to lying assist level: Minimal Assistance - Patient > 75%    Lying to sitting on side of bed activity   Lying to sitting on side of bed assist level: the ability to move from lying on the back to sitting on the side of the bed with no back support.: Moderate Assistance - Patient 50 - 74%     Care Tool Transfers Sit to stand transfer   Sit to stand assist level: Moderate Assistance - Patient 50 - 74%    Chair/bed transfer   Chair/bed transfer assist level: Moderate Assistance - Patient 50 - 74%     Toilet transfer   Assist Level: Moderate Assistance - Patient 50 - 74%     Care Tool Cognition  Expression of Ideas and Wants Expression of Ideas and Wants: 3. Some difficulty - exhibits some difficulty with expressing needs and ideas (e.g, some words or finishing thoughts) or speech is not clear  Understanding Verbal and Non-Verbal Content Understanding Verbal and Non-Verbal Content: 3. Usually understands - understands most conversations, but misses some part/intent of message. Requires cues at times to understand   Memory/Recall Ability Memory/Recall Ability : That he or she is in a hospital/hospital unit   Refer to Care Plan for Long Term Goals  SHORT TERM GOAL WEEK 1 OT Short Term Goal 1 (Week 1): Pt will perform toilet transfers with LRAD with CGA OT Short Term Goal 2 (Week 1): Pt will perform 3/3 toileting tasks with no more than CGA OT Short Term Goal 3 (Week 1): Pt will improve RUE MMT to 3/5 OT Short Term Goal 4 (Week 1): Pt will perform UB/LB dress with CGA  Recommendations for other services: None    Skilled Therapeutic Intervention ADL ADL Eating: Set up Grooming: Supervision/safety Upper Body Bathing: Supervision/safety Lower Body Bathing: Moderate assistance Where Assessed-Lower Body Bathing: Standing at sink;Sitting at sink Upper Body Dressing: Minimal  assistance Lower Body Dressing: Moderate assistance Where Assessed-Lower Body Dressing: Standing at sink;Sitting at sink Toileting: Moderate assistance Toilet Transfer: Moderate assistance Mobility  Bed Mobility Bed Mobility: Left Sidelying to Sit;Supine to Sit Left Sidelying to Sit: Moderate Assistance - Patient 50-74% Supine to Sit: Moderate Assistance - Patient 50-74% Transfers Sit to Stand: Moderate Assistance - Patient 50-74% Stand to Sit: Moderate Assistance - Patient 50-74%   Skilled Interventions: See above and below for evaluation details. Reviewed with pt OT goals, purpose, and POC and pt pleasant and agreeable. OT retrieving RW  at beginning of session and adjusting for the pt height. Focus of session on ADL routine with bed mobility MOD fading to MIN. Sit to stands MOD A overall with posterior lean, short distance ambulation in room with MOD A with RW to sink and other surfaces, bathing tasks with MOD A overall and sit to stand at sink level for LB, UB dress MIN  and LB dress mOD A. Note extended time 2/2 catheter and having to change pants. Pt performing short distance mobility back to bed and set up all needs met. Alarmt on call bell in reach.    Discharge Criteria: Patient will be discharged from OT if patient refuses treatment 3 consecutive times without medical reason, if treatment goals not met, if there is a change in medical status, if patient makes no progress towards goals or if patient is discharged from hospital.  The above assessment, treatment plan, treatment alternatives and goals were discussed and mutually agreed upon: by patient  Viona Gilmore 08/15/2022, 9:11 AM

## 2022-08-15 NOTE — H&P (Incomplete)
Expand All Collapse All      Physical Medicine and Rehabilitation Admission H&P     No chief complaint on file.     HPI: Carlos Shields is a 89 y.o. male with a PMHx of seizure on Keppra 500mg  BID, CVA, hypertension, CKD stage IIIa, pancreatitis, venous insufficiency, and dysphagia who was admitted on 08/05/22 for right-sided weakness, incontinence, seizure like activity, and fall. He had been out of his Keppra and ASA. In the ED, his work up was notable for Cr 2.1, Hgb 9.5, TSH 4.922, Hgb A1C 5.3%. He underwent an MRI brain 08/05/22 which revealed multiple small acute infarcts involving the L basal ganglia, L frontal, parietal, and occipital cortex, and adjacent white matter with slight edema but no mass effect. He was admitted and underwent MRA head on 08/06/22 which showed intracranial atherosclerotic disease with associated moderate stenosis at the L carotid siphon with additional severe distal L P3 stenosis. Vascular surgery was consulted, he underwent CTA neck on 08/08/22 which showed proximal L ICA stenosis 75% and 65% on right, and he subsequently underwent L transcarotid artery revascularization on 08/12/22 with Dr. Donzetta Matters. Lipid panel done on 08/06/22 showing LDL 69, cholesterol 143, started on Crestor 10mg  QD during hospitalization.  2D echo performed 08/06/22 and showed preserved EF 23-55% but LV diastolic parameters c/w grade I diastolic dysfunction, trivial MV regurg without stenosis, mild calcification and thickening of aortic valve, no evidence of IA shunt. He was resumed on his home daily ASA 81mg , and started on Plavix 75mg  QD on 08/08/22 which was recommended to be continued for 3 weeks and then go on Plavix alone.  He remained stable during his hospitalization, with improving kidney function back to baseline. CIR was recommended due to functional decline.    At baseline prior to admission, he ambulates with a walker  Review of Systems  Constitutional:  Positive for malaise/fatigue. Negative for  chills and fever.  Respiratory:  Negative for shortness of breath.   Cardiovascular:  Negative for chest pain.  Gastrointestinal:  Negative for abdominal pain, constipation, diarrhea, nausea and vomiting.  Genitourinary:  Negative for urgency.  Musculoskeletal:  Positive for joint pain.  Neurological:  Positive for weakness. Negative for sensory change.  Psychiatric/Behavioral:  Positive for memory loss. The patient has insomnia.           Past Medical History:  Diagnosis Date  . CKD (chronic kidney disease) stage 3, GFR 30-59 ml/min (HCC)    . Gait abnormality    . Hypertension    . Pancreatitis    . Seizures ([place])    . Venous insufficiency           Past Surgical History:  Procedure Laterality Date  . HERNIA REPAIR      . TRANSCAROTID ARTERY REVASCULARIZATION  Left 08/12/2022    Procedure: Transcarotid Artery Revascularization;  Surgeon: Waynetta Sandy, MD;  Location: [facility] OR;  Service: Vascular;  Laterality: Left;         Family History  Problem Relation Age of Onset  . Hypertension Other    . Diabetes Other    . Asthma Other      Social History:  reports that he has been smoking cigars. He has never used smokeless tobacco. He reports that he does not drink alcohol and does not use drugs. Allergies: No Known Allergies       Medications Prior to Admission  Medication Sig Dispense Refill  . amLODipine (NORVASC) 5 MG tablet Take 5 mg by mouth  daily.      . aspirin EC 81 MG tablet Take 81 mg by mouth daily. Swallow whole.      . levETIRAcetam (KEPPRA) 500 MG tablet Take 1 tablet (500 mg total) by mouth 2 (two) times daily. 60 tablet 3  . metoprolol tartrate (LOPRESSOR) 50 MG tablet Take 50 mg by mouth 2 (two) times daily.   4  . multivitamin (ONE-A-DAY MEN'S) TABS tablet Take 1 tablet by mouth daily.      . pantoprazole (PROTONIX) 40 MG tablet Take 1 tablet (40 mg total) by mouth daily. 30 tablet 3          Home:   Functional History:   Functional Status:   Mobility:   ADL:   Cognition: Cognition Orientation Level: Oriented to person, Oriented to place, Oriented to situation   Physical Exam: Blood pressure 129/66, pulse (!) 57, temperature 98.2 F (36.8 C), temperature source Oral, resp. rate 16, weight 53.3 kg, SpO2 99 %. Physical Exam   Lab Results Last 48 Hours        Results for orders placed or performed during the hospital encounter of 08/05/22 (from the past 48 hour(s))  CBC     Status: Abnormal    Collection Time: 08/13/22  4:05 AM  Result Value Ref Range    WBC 9.3 4.0 - 10.5 K/uL    RBC 2.73 (L) 4.22 - 5.81 MIL/uL    Hemoglobin 8.0 (L) 13.0 - 17.0 g/dL    HCT 23.5 (L) 39.0 - 52.0 %    MCV 86.1 80.0 - 100.0 fL    MCH 29.3 26.0 - 34.0 pg    MCHC 34.0 30.0 - 36.0 g/dL    RDW 12.4 11.5 - 15.5 %    Platelets 182 150 - 400 K/uL    nRBC 0.0 0.0 - 0.2 %      Comment: Performed at [facility], [place] [address], [place], [place] [address]  Basic metabolic panel     Status: Abnormal    Collection Time: 08/13/22  4:05 AM  Result Value Ref Range    Sodium 135 135 - 145 mmol/L    Potassium 4.2 3.5 - 5.1 mmol/L    Chloride 102 98 - 111 mmol/L    CO2 21 (L) 22 - 32 mmol/L    Glucose, Bld 92 70 - 99 mg/dL      Comment: Glucose reference range applies only to samples taken after fasting for at least 8 hours.    BUN 27 (H) 8 - 23 mg/dL    Creatinine, Ser 1.57 (H) 0.61 - 1.24 mg/dL    Calcium 8.5 (L) 8.9 - 10.3 mg/dL    GFR, Estimated 42 (L) >60 mL/min      Comment: (NOTE) Calculated using the CKD-EPI Creatinine Equation (2021)      Anion gap 12 5 - 15      Comment: Performed at [facility] [address], [place], [place] [address]      Imaging Results (Last 48 hours)  No results found.         Blood pressure 129/66, pulse (!) 57, temperature 98.2 F (36.8 C), temperature source Oral, resp. rate 16, weight 53.3 kg, SpO2 99 %.   Medical Problem List and Plan: 1. Functional deficits secondary to multiple  small acute CVA infacrts involving the left basal ganglia, left frontal, right occipital cortex and adjacent white matter              -  patient may shower             -ELOS/Goals: *** 2.  Antithrombotics: -DVT/anticoagulation: ***  -antiplatelet therapy: DAPT ASA 81mg  QD + Plavix 75mg  QD x3 weeks then Plavix alone (~08/29/22) 3. Pain Management: *** 4. Mood/Behavior/Sleep: ***             -antipsychotic agents: *** 5. Neuropsych/cognition: This patient *** capable of making decisions on *** own behalf. 6. Skin/Wound Care: *** 7. Fluids/Electrolytes/Nutrition: *** 8. L internal Carotid Artery stenosis s/p TCAR 08/12/22, continue ASA/Plavix/Crestor             -f/up in 4 weeks with carotid duplex with Dr. Donzetta Matters 9. Seizures: continue Keppra 500mg  BID 10. HTN: continue Norvasc 5mg  QD, held home Metoprolol 50mg  BID during hospitalization but restarted at 12.5mg  BID -Per neurology, allowed for permissive HTN (ok if <220/120) but gradually normalize by 08/14/22, long term normotensive goal 11. Acute on Chronic CKD stage 3a: baseline Cr ~1.2, peaked at 2.1, renal U/S 08/06/22 showing chronic medical renal disease but no hydronephrosis             -Avoid nephrotoxic agents, monitor UOP, encourage PO intake 12. Chronic normocytic anemia: baseline hgb 9-10, monitor CBC             -Hgb down to 8.2 after TCAR, stable at 8.0 on 08/13/22 13. Abnormal TSH: TSH 4.9 on admission 08/05/22, free T4 0.88*** 14. Hyponatremia: improved during hospitalization with hydration, monitor BMP       ***   [address], PA-C 08/14/2022

## 2022-08-15 NOTE — H&P (Signed)
Expand All Collapse All      Physical Medicine and Rehabilitation Admission H&P     No chief complaint on file.     HPI: Carlos Shields is a 89 y.o. male with a PMHx of seizure on Keppra 500mg  BID, CVA, hypertension, CKD stage IIIa, pancreatitis, venous insufficiency, and dysphagia who was admitted on 08/05/22 for right-sided weakness, incontinence, seizure like activity, and fall. He had been out of his Keppra and ASA. In the ED, his work up was notable for Cr 2.1, Hgb 9.5, TSH 4.922, Hgb A1C 5.3%. He underwent an MRI brain 08/05/22 which revealed multiple small acute infarcts involving the L basal ganglia, L frontal, parietal, and occipital cortex, and adjacent white matter with slight edema but no mass effect. He was admitted and underwent MRA head on 08/06/22 which showed intracranial atherosclerotic disease with associated moderate stenosis at the L carotid siphon with additional severe distal L P3 stenosis. Vascular surgery was consulted, he underwent CTA neck on 08/08/22 which showed proximal L ICA stenosis 75% and 65% on right, and he subsequently underwent L transcarotid artery revascularization on 08/12/22 with Dr. Donzetta Matters. Lipid panel done on 08/06/22 showing LDL 69, cholesterol 143, started on Crestor 10mg  QD during hospitalization.  2D echo performed 08/06/22 and showed preserved EF [id number] but LV diastolic parameters c/w grade I diastolic dysfunction, trivial MV regurg without stenosis, mild calcification and thickening of aortic valve, no evidence of IA shunt. He was resumed on his home daily ASA 81mg , and started on Plavix 75mg  QD on 08/08/22 which was recommended to be continued for 3 weeks and then go on Plavix alone.  He remained stable during his hospitalization, with improving kidney function back to baseline. CIR was recommended due to functional decline.    At baseline prior to admission, he ambulates with a walker  Review of Systems  Constitutional:  Positive for malaise/fatigue. Negative for  chills and fever.  Respiratory:  Negative for shortness of breath.   Cardiovascular:  Negative for chest pain.  Gastrointestinal:  Negative for abdominal pain, constipation, diarrhea, nausea and vomiting.  Genitourinary:  Negative for urgency.  Musculoskeletal:  Positive for joint pain.  Neurological:  Positive for weakness. Negative for sensory change.  Psychiatric/Behavioral:  Positive for memory loss. The patient has insomnia.           Past Medical History:  Diagnosis Date   CKD (chronic kidney disease) stage 3, GFR 30-59 ml/min (HCC)     Gait abnormality     Hypertension     Pancreatitis     Seizures (HCC)     Venous insufficiency           Past Surgical History:  Procedure Laterality Date   HERNIA REPAIR       TRANSCAROTID ARTERY REVASCULARIZATION  Left 08/12/2022    Procedure: Transcarotid Artery Revascularization;  Surgeon: Waynetta Sandy, MD;  Location: [facility] OR;  Service: Vascular;  Laterality: Left;         Family History  Problem Relation Age of Onset   Hypertension Other     Diabetes Other     Asthma Other      Social History:  reports that he has been smoking cigars. He has never used smokeless tobacco. He reports that he does not drink alcohol and does not use drugs. Allergies: No Known Allergies       Medications Prior to Admission  Medication Sig Dispense Refill   amLODipine (NORVASC) 5 MG tablet Take 5 mg by mouth  daily.       aspirin EC 81 MG tablet Take 81 mg by mouth daily. Swallow whole.       levETIRAcetam (KEPPRA) 500 MG tablet Take 1 tablet (500 mg total) by mouth 2 (two) times daily. 60 tablet 3   metoprolol tartrate (LOPRESSOR) 50 MG tablet Take 50 mg by mouth 2 (two) times daily.   4   multivitamin (ONE-A-DAY MEN'S) TABS tablet Take 1 tablet by mouth daily.       pantoprazole (PROTONIX) 40 MG tablet Take 1 tablet (40 mg total) by mouth daily. 30 tablet 3          Home:  Plans for Discharge Living Setting: House (daughter's  house) Type of Home at Discharge: House Discharge Home Layout: One level Discharge Home Access: Stairs to enter Entrance Stairs-Rails: None Entrance Stairs-Number of Steps: 1 Discharge Bathroom Shower/Tub: Tub/shower unit Discharge Bathroom Toilet: Standard Discharge Bathroom Accessibility: Yes How Accessible: Accessible via walker Does the patient have any problems obtaining your medications?: No  Functional History: Self Care: Did the patient need help bathing, dressing, using the toilet or eating? Independent   Indoor Mobility: Did the patient need assistance with walking from room to room (with or without device)? Independent   Stairs: Did the patient need assistance with internal or external stairs (with or without device)? Independent   Functional Cognition: Did the patient need help planning regular tasks such as shopping or remembering to take medications? Independent Home Assistive Devices/Equipment: Gilford Rile (specify type) Home Equipment: Tub bench, Rolling Walker (2 wheels), Cane - single point, [employer] - quad, BSC/3in1 Functional Status:  Cognition   Arousal/Alertness: Awake/alert Overall Cognitive Status: No family/caregiver present to determine baseline cognitive functioning Current Attention Level: Sustained Orientation Level: Oriented to person, Oriented to place, Oriented to situation Following Commands: Follows one step commands with increased time Safety/Judgement: Decreased awareness of deficits, Decreased awareness of safety General Comments: Pt oriented to place and situation but not month or year when asked.  Increased time needed with mod instructional cueing for sequencing oral hygiene at the sink. Attention: Sustained Sustained Attention: Appears intact Memory: Impaired Memory Impairment: Retrieval deficit Awareness: Impaired Awareness Impairment: Emergent impairment Problem Solving: Impaired Problem Solving Impairment: Verbal basic       ADLs   Overall  ADL's : Needs assistance/impaired Eating/Feeding: Set up, Sitting Grooming: Oral care, Minimal assistance, Standing Upper Body Bathing: Minimal assistance, Sitting Lower Body Bathing: Sit to/from stand, Moderate assistance Upper Body Dressing : Sitting, Minimal assistance Lower Body Dressing: Sit to/from stand, Maximal assistance Toilet Transfer: Moderate assistance, Stand-pivot, BSC/3in1, Ambulation, Rolling walker (2 wheels) Toilet Transfer Details (indicate cue type and reason): simulated as pt with condom cath and denies need to have BM Toileting- Clothing Manipulation and Hygiene: Moderate assistance, Sit to/from stand Toileting - Clothing Manipulation Details (indicate cue type and reason): for clothing Functional mobility during ADLs: Minimal assistance, Rolling walker (2 wheels) General ADL Comments: Pt with decreased awareness of deficits from CVAs when asked.  He needed increased time for processing and sequencing oral hygiene at the sink.  Mod instructional and questioning cueing for taking toothbrush out of wrapper that therapist had already opened and for applying toothpaste.  Increased ataxia and decreased coordination noted in the RUE compared to the left when attempting to manipulate and open the toothpaste as well as when brushing.  Uses the RUE at an active assist level with increased time during session.     Mobility   Overal bed mobility: Needs Assistance  Bed Mobility: Supine to Sit Supine to sit: Mod assist Sit to supine: Mod assist General bed mobility comments: Assist for bringing LEs off of the bed and scooting to the edge as well as support for lifting trunk up to sitting.  Mod assist for lifting LEs back in the bed and centering trunk.     Transfers   Overall transfer level: Needs assistance Equipment used: Rolling walker (2 wheels) Transfers: Sit to/from Stand Sit to Stand: Mod assist Bed to/from chair/wheelchair/BSC transfer type:: Step pivot Stand pivot  transfers: Mod assist Step pivot transfers: Mod assist General transfer comment: Mod assist for sit to stand from the EOB with mod demonstrational cueing for hand placement.  Increased posterior lean with initial standing.     Ambulation / Gait / Stairs / Wheelchair Mobility   Ambulation/Gait Ambulation/Gait assistance: Min assist, Mod assist Gait Distance (Feet): 115 Feet Assistive device: Rolling walker (2 wheels) Gait Pattern/deviations: Decreased stride length, Narrow base of support, Trunk flexed, Leaning posteriorly, Decreased step length - right, Decreased step length - left, Shuffle General Gait Details: verbal cues for wider BOS and increased stride length, in addition to walker proximity and upright posture, mod a to maintain balance and facilitate anterior weight shift, moments of light min a, pt unable o coordinate side steps to [facility] this date at end of session Gait velocity: decreased Gait velocity interpretation: <1.8 ft/sec, indicate of risk for recurrent falls      Physical Exam: Blood pressure 129/66, pulse (!) 57, temperature 98.2 F (36.8 C), temperature source Oral, resp. rate 16, weight 53.3 kg, SpO2 99 %. Physical Exam  Constitutional: No apparent distress. Appropriate appearance for age.  HENT: No JVD. Neck Supple. Trachea midline. Atraumatic, normocephalic. Eyes: PERRLA. EOMI. Visual fields grossly intact.  Cardiovascular: RRR, no murmurs/rub/gallops. No Edema. Peripheral pulses 2+  Respiratory: CTAB. No rales, rhonchi, or wheezing. On RA.  Abdomen: + bowel sounds, normoactive. No distention or tenderness.  GU: Not examined.   Skin: TCAR site L neck c.d.I, dermabonded MSK:      No apparent deformity.      Strength: 5/5 throughout LUE and LLE; 4/5 throughout RUE and RLE  Neurologic exam:  Cognition: AAO to person, place, time and event.  Language: Fluent, No substitutions or neoglisms. No dysarthria. Names 3/3 objects correctly.  Memory: Recalls 0/3 objects at  5 minutes. +Obvious deficits Insight: Good  insight into current condition.  Mood: Pleasant affect, appropriate mood.  Sensation: To light touch intact in BL UEs and LEs  Reflexes: 2+ in BL UE and LEs. Negative Hoffman's and babinski signs bilaterally.  CN: 2-12 grossly intact.  Coordination: No apparent tremors. + RUE ataxia on FTN, none on HTS bilaterally.  Spasticity: ? MAS 1+ vs. Active resistance of L biceps      Lab Results Last 48 Hours        Results for orders placed or performed during the hospital encounter of 08/05/22 (from the past 48 hour(s))  CBC     Status: Abnormal    Collection Time: 08/13/22  4:05 AM  Result Value Ref Range    WBC 9.3 4.0 - 10.5 K/uL    RBC 2.73 (L) 4.22 - 5.81 MIL/uL    Hemoglobin 8.0 (L) 13.0 - 17.0 g/dL    HCT 23.5 (L) 39.0 - 52.0 %    MCV 86.1 80.0 - 100.0 fL    MCH 29.3 26.0 - 34.0 pg    MCHC 34.0 30.0 - 36.0 g/dL  RDW 12.4 11.5 - 15.5 %    Platelets 182 150 - 400 K/uL    nRBC 0.0 0.0 - 0.2 %      Comment: Performed at [facility], [place] [address] [place], [place] [address]  Basic metabolic panel     Status: Abnormal    Collection Time: 08/13/22  4:05 AM  Result Value Ref Range    Sodium 135 135 - 145 mmol/L    Potassium 4.2 3.5 - 5.1 mmol/L    Chloride 102 98 - 111 mmol/L    CO2 21 (L) 22 - 32 mmol/L    Glucose, Bld 92 70 - 99 mg/dL      Comment: Glucose reference range applies only to samples taken after fasting for at least 8 hours.    BUN 27 (H) 8 - 23 mg/dL    Creatinine, Ser 1.57 (H) 0.61 - 1.24 mg/dL    Calcium 8.5 (L) 8.9 - 10.3 mg/dL    GFR, Estimated 42 (L) >60 mL/min      Comment: (NOTE) Calculated using the CKD-EPI Creatinine Equation (2021)      Anion gap 12 5 - 15      Comment: Performed at [facility] [address], [place], [place] [address]      Imaging Results (Last 48 hours)  No results found.         Blood pressure 129/66, pulse (!) 57, temperature 98.2 F (36.8 C), temperature  source Oral, resp. rate 16, weight 53.3 kg, SpO2 99 %.   Medical Problem List and Plan: 1. Functional deficits secondary to multiple small acute ischemic CVA infarcts involving the left basal ganglia, left frontal, right occipital cortex and adjacent white matter              -patient may shower             -ELOS/Goals: 10-12 days, supervision PT, supervision OT, supervision SLP  2.  Antithrombotics: -DVT/anticoagulation: Lovenox 30 mg daily  -antiplatelet therapy: DAPT ASA 81mg  QD + Plavix 75mg  QD x3 weeks then Plavix alone (~08/29/22) 3. Pain Management: tylenol PRN, oxy 5 mg Q4H PRN.  4. Mood/Behavior/Sleep: Trazodone 25-50 mg PRN             -antipsychotic agents: none 5. Neuropsych/cognition: This patient is capable of making decisions on his own behalf. 6. Skin/Wound Care: Standard skin assessments, wound care.  7. Fluids/Electrolytes/Nutrition: Heart healthy diet, thins. Labs in AM.  8. L internal Carotid Artery stenosis s/p TCAR 08/12/22, continue ASA/Plavix/Crestor             -f/up in 4 weeks with carotid duplex with Dr. Donzetta Matters 9. Seizures: continue Keppra 500mg  BID 10. HTN: continue Norvasc 5mg  QD, held home Metoprolol 50mg  BID during hospitalization but restarted at 12.5mg  BID -Per neurology, allowed for permissive HTN (ok if <220/120) but gradually normalize by 08/14/22, long term normotensive goal    08/14/2022    7:17 PM 08/14/2022    3:00 PM 08/14/2022    2:50 PM  Vitals with BMI  Height  5\' 4"    Weight  117 lbs 8 oz 117 lbs 8 oz  BMI  41.96 22.29  Systolic 798 921 194  Diastolic 79 66 66  Pulse 74 57 57    11. Acute on Chronic CKD stage 3a: baseline Cr ~1.2, peaked at 2.1, renal U/S 08/06/22 showing chronic medical renal disease but no hydronephrosis             -  Avoid nephrotoxic agents, monitor UOP, encourage PO intake 12. Chronic normocytic anemia: baseline hgb 9-10, monitor CBC             -Hgb down to 8.2 after TCAR, stable at 8.0 on 08/13/22 13. Abnormal TSH: TSH 4.9 on  admission 08/05/22, free T4 0.88; WNL.  14. Hyponatremia: improved during hospitalization with hydration, monitor BMP     [address], PA-C 08/14/2022   I have examined the patient independently and edited the note for HPI, ROS, exam, assessment, and plan as appropriate. I am in agreement with the above recommendations.   Angelina Sheriff, DO 08/15/2022

## 2022-08-15 NOTE — Evaluation (Signed)
Speech Language Pathology Assessment and Plan  Patient Details  Name: Carlos Shields MRN: [id number] Date of Birth: 01/05/1931  SLP Diagnosis: Cognitive Impairments  Rehab Potential: Good ELOS: 10-12 days    Today's Date: 08/15/2022 SLP Individual Time: 1300-1400 SLP Individual Time Calculation (min): 76 min   Hospital Problem: Principal Problem:   CVA (cerebrovascular accident) [facility])  Past Medical History:  Past Medical History:  Diagnosis Date   CKD (chronic kidney disease) stage 3, GFR 30-59 ml/min (HCC)    Gait abnormality    Hypertension    Pancreatitis    Seizures ([place])    Venous insufficiency    Past Surgical History:  Past Surgical History:  Procedure Laterality Date   HERNIA REPAIR     TRANSCAROTID ARTERY REVASCULARIZATION  Left 08/12/2022   Procedure: Transcarotid Artery Revascularization;  Surgeon: Waynetta Sandy, MD;  Location: [place];  Service: Vascular;  Laterality: Left;    Assessment / Plan / Recommendation Clinical Impression  Carlos Shields is a 89 y.o. male with a PMHx of seizure on Keppra 500mg  BID, CVA, hypertension, CKD stage IIIa, pancreatitis, venous insufficiency, and dysphagia who was admitted on 08/05/22 for right-sided weakness, incontinence, seizure like activity, and fall. He had been out of his Keppra and ASA. In the ED, his work up was notable for Cr 2.1, Hgb 9.5, TSH 4.922, Hgb A1C 5.3%. He underwent an MRI brain 08/05/22 which revealed multiple small acute infarcts involving the L basal ganglia, L frontal, parietal, and occipital cortex, and adjacent white matter with slight edema but no mass effect. He was admitted and underwent MRA head on 08/06/22 which showed intracranial atherosclerotic disease with associated moderate stenosis at the L carotid siphon with additional severe distal L P3 stenosis. Vascular surgery was consulted, he underwent CTA neck on 08/08/22 which showed proximal L ICA stenosis 75% and 65% on right, and he subsequently underwent  L transcarotid artery revascularization on 08/12/22 with Dr. Donzetta Matters. Lipid panel done on 08/06/22 showing LDL 69, cholesterol 143, started on Crestor 10mg  QD during hospitalization.  2D echo performed 08/06/22 and showed preserved EF 03-50% but LV diastolic parameters c/w grade I diastolic dysfunction, trivial MV regurg without stenosis, mild calcification and thickening of aortic valve, no evidence of IA shunt. He was resumed on his home daily ASA 81mg , and started on Plavix 75mg  QD on 08/08/22 which was recommended to be continued for 3 weeks and then go on Plavix alone.  He remained stable during his hospitalization, with improving kidney function back to baseline. CIR was recommended due to functional decline. SLP evaluation completed on 08/15/22 with results as follows:  Pt presents with moderate cognitive deficits and scored 4 out of a possible 26 total points (clock drawing task was omitted due to right sided weakness impacting ability to write) on the SLUMS standardized cognitive assessment.  Pt's baseline level of functioning is unclear but he did state that his daughter [name] did everything for him including cooking, driving, and medication management.  Pt states he mostly spent his time walking to the store to buy snacks and visiting nearby family members.  Deficits were noted in memory, emergent awareness, and problem solving.  As a result, pt currently requires max assist to complete functional tasks.  Pt's speech was difficult to understand at times due to low vocal intensity and rapid rushes of speech with minimal awareness; however, pt states that his speech is at baseline.  No significant oral motor weakness was observed to impact articulatory precision.  Given  the abovementioned deficits, pt would benefit from skilled ST while inpatient in order to maximize functional independence and reduce burden of care prior to discharge.  Anticipate that pt will need 24& supervision at discharge in addition to  possible ST follow up at next level of care.    Skilled Therapeutic Interventions          Cognitive-linguistic evaluation completed with results and recommendations reviewed with patient.    SLP Assessment  Patient will need skilled [place] [facility] during CIR admission    Recommendations  Patient destination: Home Follow up Recommendations: Home Health SLP Equipment Recommended: None recommended by SLP    SLP Frequency 3 to 5 out of 7 days   SLP Duration  SLP Intensity  SLP Treatment/Interventions 10-12 days  Minumum of 1-2 x/day, 30 to 90 minutes  Cognitive remediation/compensation;Cueing hierarchy;Functional tasks;Environmental controls;Patient/family education    Pain Pain Assessment Pain Scale: 0-10 Pain Score: 0-No pain  Prior Functioning Cognitive/Linguistic Baseline: Information not available Type of Home: House  Lives With: Daughter Available Help at Discharge: Family;Available 24 hours/day Vocation: On disability  SLP Evaluation Cognition Overall Cognitive Status: No family/caregiver present to determine baseline cognitive functioning Arousal/Alertness: Awake/alert Orientation Level: Oriented to person;Oriented to place;Oriented to situation Attention: Sustained Sustained Attention: Appears intact Memory: Impaired Memory Impairment: Storage deficit;Retrieval deficit Awareness: Impaired Awareness Impairment: Emergent impairment Problem Solving: Impaired Problem Solving Impairment: Functional basic;Verbal basic Safety/Judgment: Impaired  Comprehension Auditory Comprehension Overall Auditory Comprehension: Appears within functional limits for tasks assessed Expression Expression Primary Mode of Expression: Verbal Verbal Expression Overall Verbal Expression: Appears within functional limits for tasks assessed Oral Motor Oral Motor/Sensory Function Overall Oral Motor/Sensory Function: Within functional limits Motor Speech Overall  Motor Speech: Appears within functional limits for tasks assessed  Care Tool Care Tool Cognition Ability to hear (with hearing aid or hearing appliances if normally used Ability to hear (with hearing aid or hearing appliances if normally used): 1. Minimal difficulty - difficulty in some environments (e.g. when person speaks softly or setting is noisy)   Expression of Ideas and Wants Expression of Ideas and Wants: 3. Some difficulty - exhibits some difficulty with expressing needs and ideas (e.g, some words or finishing thoughts) or speech is not clear   Understanding Verbal and Non-Verbal Content Understanding Verbal and Non-Verbal Content: 3. Usually understands - understands most conversations, but misses some part/intent of message. Requires cues at times to understand  Memory/Recall Ability Memory/Recall Ability : That he or she is in a hospital/hospital unit     Short Term Goals: Week 1: SLP Short Term Goal 1 (Week 1): Pt will utilize external aids to recall daily information with mod assist multimodal cues. SLP Short Term Goal 2 (Week 1): Pt will complete basic, famliar tasks with mod assist for functional problem solving. SLP Short Term Goal 3 (Week 1): Pt will recognize and correct errors when completing functional tasks with mod assist multimodal cues.  Refer to Care Plan for Long Term Goals  Recommendations for other services: None   Discharge Criteria: Patient will be discharged from SLP if patient refuses treatment 3 consecutive times without medical reason, if treatment goals not met, if there is a change in medical status, if patient makes no progress towards goals or if patient is discharged from hospital.  The above assessment, treatment plan, treatment alternatives and goals were discussed and mutually agreed upon: by patient  Emilio Math 08/15/2022, 4:12 PM

## 2022-08-15 NOTE — Discharge Instructions (Addendum)
STROKE/TIA DISCHARGE INSTRUCTIONS SMOKING Cigarette smoking nearly doubles your risk of having a stroke & is the single most alterable risk factor  If you smoke or have smoked in the last 12 months, you are advised to quit smoking for your health. Most of the excess cardiovascular risk related to smoking disappears within a year of stopping. Ask you doctor about anti-smoking medications [place] Quit Line: 1-800-QUIT NOW Free Smoking Cessation Classes [phone]  CHOLESTEROL Know your levels; limit fat & cholesterol in your diet  Lipid Panel     Component Value Date/Time   CHOL 143 08/06/2022 0523   TRIG 27 08/06/2022 0523   HDL 69 08/06/2022 0523   CHOLHDL 2.1 08/06/2022 0523   VLDL 5 08/06/2022 0523   LDLCALC 69 08/06/2022 0523     Many patients benefit from treatment even if their cholesterol is at goal. Goal: Total Cholesterol (CHOL) less than 160 Goal:  Triglycerides (TRIG) less than 150 Goal:  HDL greater than 40 Goal:  LDL (LDLCALC) less than 100   BLOOD PRESSURE American Stroke Association blood pressure target is less that 120/80 mm/Hg  Your discharge blood pressure is:  BP: (!) 140/64 Monitor your blood pressure Limit your salt and alcohol intake Many individuals will require more than one medication for high blood pressure  DIABETES (A1c is a blood sugar average for last 3 months) Goal HGBA1c is under 7% (HBGA1c is blood sugar average for last 3 months)  Diabetes: No known diagnosis of diabetes    Lab Results  Component Value Date   HGBA1C 5.3 08/05/2022    Your HGBA1c can be lowered with medications, healthy diet, and exercise. Check your blood sugar as directed by your physician Call your physician if you experience unexplained or low blood sugars.  PHYSICAL ACTIVITY/REHABILITATION Goal is 30 minutes at least 4 days per week  Activity: Increase activity slowly, Therapies: Physical Therapy: Home Health Return to work:  Activity decreases your risk of heart attack  and stroke and makes your heart stronger.  It helps control your weight and blood pressure; helps you relax and can improve your mood. Participate in a regular exercise program. Talk with your doctor about the best form of exercise for you (dancing, walking, swimming, cycling).  DIET/WEIGHT Goal is to maintain a healthy weight  Your discharge diet is:  Diet Order             Diet Heart Room service appropriate? Yes with Assist; Fluid consistency: Thin  Diet effective now                   liquids Your height is:  Height: 5' 4"$  (162.6 cm) Your current weight is: Weight: 53.3 kg Your Body Mass Index (BMI) is:  BMI (Calculated): 20.16 Following the type of diet specifically designed for you will help prevent another stroke. Your goal weight range is:   Your goal Body Mass Index (BMI) is 19-24. Healthy food habits can help reduce 3 risk factors for stroke:  High cholesterol, hypertension, and excess weight.  RESOURCES Stroke/Support Group:  Call [phone]   STROKE EDUCATION PROVIDED/REVIEWED AND GIVEN TO PATIENT Stroke warning signs and symptoms How to activate emergency medical system (call 911). Medications prescribed at discharge. Need for follow-up after discharge. Personal risk factors for stroke. Pneumonia vaccine given: No Flu vaccine given: No My questions have been answered, the writing is legible, and I understand these instructions.  I will adhere to these goals & educational materials that have been provided  to me after my discharge from the hospital.   Inpatient Rehab Discharge Instructions  Carlos Shields Discharge date and time: No discharge date for patient encounter.   Activities/Precautions/ Functional Status: Activity: As tolerated Diet: Dysphagia #1 thin liquids Wound Care: Routine skin checks Functional status:  ___ No restrictions     ___ Walk up steps independently ___ 24/7 supervision/assistance   ___ Walk up steps with assistance ___ Intermittent  supervision/assistance  ___ Bathe/dress independently ___ Walk with walker     _x__ Bathe/dress with assistance ___ Walk Independently    ___ Shower independently ___ Walk with assistance    ___ Shower with assistance ___ No alcohol     ___ Return to work/school ________  Special Instructions: No driving smoking or alcohol  Continue aspirin 81 mg daily and Plavix 75 mg daily until 08/29/2022 then Plavix alone  Follow-up with PCP in regards to elevated TSH levels     COMMUNITY REFERRALS UPON DISCHARGE:    Outpatient: PT     OT    ST                 Agency: [facility]  Phone:  [phone]             Appointment Date/Time: *Please expect follow-up within 7-10 business days to schedule your appointment. If you have not received follow-up, be sure to contact site directly.*  Medical Equipment/Items Ordered:3in1 bedside commode                                                 Agency/Supplier:[facility] [phone]     My questions have been answered and I understand these instructions. I will adhere to these goals and the provided educational materials after my discharge from the hospital.  Patient/Caregiver Signature _______________________________ Date __________  Clinician Signature _______________________________________ Date __________  Please bring this form and your medication list with you to all your follow-up doctor's appointments.

## 2022-08-16 DIAGNOSIS — I69391 Dysphagia following cerebral infarction: Secondary | ICD-10-CM

## 2022-08-16 LAB — TYPE AND SCREEN
ABO/RH(D): O POS
Antibody Screen: NEGATIVE
Unit division: 0
Unit division: 0

## 2022-08-16 LAB — BPAM RBC
Blood Product Expiration Date: 202403072359
Blood Product Expiration Date: 202403082359
Unit Type and Rh: 5100
Unit Type and Rh: 5100

## 2022-08-16 NOTE — Progress Notes (Signed)
PROGRESS NOTE   Subjective/Complaints:  Pt in bed. Nurse giving AM meds. No complaints today  ROS: limited due to language/communication   Objective:   No results found. Recent Labs    08/15/22 0647  WBC 6.3  HGB 8.3*  HCT 25.6*  PLT 187   Recent Labs    08/14/22 1522 08/15/22 0647  NA 134* 132*  K 4.2 4.4  CL 103 104  CO2 22 18*  GLUCOSE 97 92  BUN 28* 32*  CREATININE 1.64* 1.61*  CALCIUM 8.3* 8.5*    Intake/Output Summary (Last 24 hours) at 08/16/2022 1049 Last data filed at 08/16/2022 0804 Gross per 24 hour  Intake 780 ml  Output 855 ml  Net -75 ml        Physical Exam: Vital Signs Blood pressure (!) 140/64, pulse 69, temperature 98.1 F (36.7 C), resp. rate 17, height 5\' 4"  (1.626 m), weight 53.3 kg, SpO2 100 %.  Constitutional: No distress . Vital signs reviewed. HEENT: NCAT, EOMI, oral membranes moist Neck: supple Cardiovascular: RRR without murmur. No JVD    Respiratory/Chest: CTA Bilaterally without wheezes or rales. Normal effort    GI/Abdomen: BS +, non-tender, non-distended Ext: no clubbing, cyanosis, or edema Psych: pleasant and cooperative  Condom cath Skin: TCAR site L neck c.d.I, dermabonded MSK:      No apparent deformity.      Strength: 5/5 throughout LUE and LLE; 4/5 throughout RUE and RLE  Ambulating with RW Min A, with some motor initiation delay/freezing   Neurologic exam:  Cognition: AAO to person, place; not time.  + memory deficits Insight: Good insight into current condition.  Language: intermittent expressive deficits/apraxia Mood: Pleasant affect, appropriate mood.  Sensation: To light touch intact in BL UEs and LEs  Coordination: + RUE ataxia; ?RLE apraxia--mild 2/5 Spasticity: None noted today    Assessment/Plan: 1. Functional deficits which require 3+ hours per day of interdisciplinary therapy in a comprehensive inpatient rehab setting. Physiatrist is  providing close team supervision and 24 hour management of active medical problems listed below. Physiatrist and rehab team continue to assess barriers to discharge/monitor patient progress toward functional and medical goals  Care Tool:  Bathing    Body parts bathed by patient: Right arm, Left arm, Chest, Abdomen, Right upper leg, Left upper leg, Face, Front perineal area   Body parts bathed by helper: Buttocks, Right lower leg, Left lower leg     Bathing assist Assist Level: Moderate Assistance - Patient 50 - 74%     Upper Body Dressing/Undressing Upper body dressing   What is the patient wearing?: Pull over shirt    Upper body assist Assist Level: Minimal Assistance - Patient > 75%    Lower Body Dressing/Undressing Lower body dressing      What is the patient wearing?: Pants     Lower body assist Assist for lower body dressing: Moderate Assistance - Patient 50 - 74%     Toileting Toileting    Toileting assist Assist for toileting: Moderate Assistance - Patient 50 - 74%     Transfers Chair/bed transfer  Transfers assist     Chair/bed transfer assist level: Minimal Assistance - Patient >  75%     Locomotion Ambulation   Ambulation assist      Assist level: Minimal Assistance - Patient > 75% Assistive device: Walker-rolling Max distance: 150 ft   Walk 10 feet activity   Assist     Assist level: Minimal Assistance - Patient > 75% Assistive device: Walker-rolling   Walk 50 feet activity   Assist    Assist level: Minimal Assistance - Patient > 75% Assistive device: Walker-rolling    Walk 150 feet activity   Assist    Assist level: Minimal Assistance - Patient > 75% Assistive device: Walker-rolling    Walk 10 feet on uneven surface  activity   Assist     Assist level: Moderate Assistance - Patient - 50 - 74% Assistive device: Aeronautical engineer Is the patient using a wheelchair?: Yes Type of  Wheelchair: Manual    Wheelchair assist level: Maximal Assistance - Patient 25 - 49% Max wheelchair distance: 5 ft    Wheelchair 50 feet with 2 turns activity    Assist        Assist Level: Total Assistance - Patient < 25%   Wheelchair 150 feet activity     Assist      Assist Level: Total Assistance - Patient < 25%   Blood pressure (!) 140/64, pulse 69, temperature 98.1 F (36.7 C), resp. rate 17, height 5\' 4"  (1.626 m), weight 53.3 kg, SpO2 100 %.  Medical Problem List and Plan: 1. Functional deficits secondary to multiple small acute ischemic CVA infarcts involving the left basal ganglia, left frontal, right occipital cortex and adjacent white matter              -patient may shower             -ELOS/Goals: 10-12 days, supervision PT, supervision OT, supervision SLP   -Continue CIR therapies including PT, OT, SLP 2.  Antithrombotics: -DVT/anticoagulation: Lovenox 30 mg daily  -antiplatelet therapy: DAPT ASA 81mg  QD + Plavix 75mg  QD x3 weeks then Plavix alone (~08/29/22) 3. Pain Management: tylenol PRN, oxy 5 mg Q4H PRN.  4. Mood/Behavior/Sleep: Trazodone 25-50 mg PRN             -antipsychotic agents: none 5. Neuropsych/cognition: This patient is capable of making decisions on his own behalf. 6. Skin/Wound Care: Standard skin assessments, wound care.   - Would encourage removal of condom catheter to avoid skin breakdown  7. Fluids/Electrolytes/Nutrition: Heart healthy diet, thins.   - Admission labs stable, mild decrease in NA  2/5 encourage fluids 8. L internal Carotid Artery stenosis s/p TCAR 08/12/22, continue ASA/Plavix/Crestor             -f/up in 4 weeks with carotid duplex with Dr. Donzetta Matters  9. Seizures: continue Keppra 500mg  BID 10. HTN: continue Norvasc 5mg  QD, held home Metoprolol 50mg  BID during hospitalization but restarted at 12.5mg  BID -Per neurology, allowed for permissive HTN (ok if <220/120) but gradually normalize by 08/14/22, long term normotensive  goal    08/16/2022    4:40 AM 08/15/2022    7:52 PM 08/15/2022    3:37 PM  Vitals with BMI  Systolic 332 951 884  Diastolic 64 62 74  Pulse 69 78 58   - 2/5: normotensive; continue current regimen   11. Acute on Chronic CKD stage 3a: baseline Cr ~1.2, peaked at 2.1, renal U/S 08/06/22 showing chronic medical renal disease but no hydronephrosis             -  Avoid nephrotoxic agents, monitoring UOP   - 2/5 labs: Cr 1.6; stable, BUN sl up---push fluids  12. Chronic normocytic anemia: baseline hgb 9-10, monitor CBC             -Hgb down to 8.2 after TCAR, stable at 8.0 on 08/13/22; 8.3 2/4 13. Abnormal TSH: TSH 4.9 on admission 08/05/22, free T4 0.88; WNL.    - F/u with PCP 14. Hyponatremia: improved during hospitalization with hydration, monitor BMP   - Decreased 134 -> 132 2/4; repeat lads QMonday    LOS: 2 days A FACE TO FACE EVALUATION WAS PERFORMED  Meredith Staggers 08/16/2022, 10:49 AM

## 2022-08-16 NOTE — Evaluation (Signed)
Speech Language Pathology Clinical Swallowing Evaluation   Patient Details  Name: Carlos Shields MRN: [id number] Date of Birth: 03/27/1931  SLP Diagnosis: Dysphagia  Rehab Potential: Good ELOS: 10-12 days    Today's Date: 08/16/2022 SLP Individual Time: 3295-1884 SLP Individual Time Calculation (min): 30 min   Hospital Problem: Principal Problem:   CVA (cerebrovascular accident) [facility])  Past Medical History:  Past Medical History:  Diagnosis Date   CKD (chronic kidney disease) stage 3, GFR 30-59 ml/min (HCC)    Gait abnormality    Hypertension    Pancreatitis    Seizures ([place])    Venous insufficiency    Past Surgical History:  Past Surgical History:  Procedure Laterality Date   HERNIA REPAIR     TRANSCAROTID ARTERY REVASCULARIZATION  Left 08/12/2022   Procedure: Transcarotid Artery Revascularization;  Surgeon: Waynetta Sandy, MD;  Location: [place];  Service: Vascular;  Laterality: Left;    Assessment / Plan / Recommendation Clinical Impression Upon arrival, patient was asleep in bed. Patient easily awakened and repositioned in bed in an upright position to maximize safety with PO intake. Throughout meal, patient noted to have prolonged mastication and mild-moderate lingual residue with solid textures requiring intermittent liquid washes. Patient was impulsive with self-feeding and often attempted to feed himself with his fingers requiring Max verbal cues to utilize utensils. Patient with 2 large coughing episodes with solid textures, both of which involved bread. Patient reports difficulty with solid textures at baseline with recent OP MBS (1/11) to assess swallowing function that showed suspected UES dysfunction and an esophagram that showed "esophageal stasis and tertiary peristaltic activity."  Due to history of dysphagia, current cognitive deficits, and lingual weakness, recommend patient downgrade to Dys. 2 textures with thin liquids with full supervision to minimize  impulsivity with PO intake. SLP provided education to the patient and his daughter via telephone regarding recommendations. Patient's daughter verbalized understanding and reported utilizing softer more chopped foods at home. She also reports patient chewing his pills at baseline, therefore, recommend patient consumed medications crushed in puree. Nursing made aware. Patient left upright in bed with NT present. Continue with current plan of care.    Skilled Therapeutic Interventions          Administered a clinical swallowing evaluation, please see above for details.   SLP Assessment  Patient will need skilled Speech Lanaguage Pathology Services during CIR admission    Recommendations  SLP Diet Recommendations: Dysphagia 2 (Fine chop);Thin Medication Administration: Crushed with puree Supervision: Patient able to self feed;Full supervision/cueing for compensatory strategies Compensations: Minimize environmental distractions;Slow rate;Small sips/bites;Follow solids with liquid;Lingual sweep for clearance of pocketing Postural Changes and/or Swallow Maneuvers: Seated upright 90 degrees;Upright 30-60 min after meal Oral Care Recommendations: Oral care BID Patient destination: Home Follow up Recommendations: Home Health SLP;24 hour supervision/assistance Equipment Recommended: None recommended by SLP    SLP Frequency 3 to 5 out of 7 days   SLP Duration  SLP Intensity  SLP Treatment/Interventions 10-12 days  Minumum of 1-2 x/day, 30 to 90 minutes  Dysphagia/aspiration precaution training;Internal/external aids;Cueing hierarchy;Environmental controls;Therapeutic Activities;Functional tasks;Patient/family education    Pain No/Denies Pain   Oral Motor Oral Motor/Sensory Function Overall Oral Motor/Sensory Function: Mild impairment Lingual Strength: Reduced Motor Speech Overall Motor Speech: Appears within functional limits for tasks assessed  Care Tool Care Tool Cognition Ability to  hear (with hearing aid or hearing appliances if normally used Ability to hear (with hearing aid or hearing appliances if normally used): 1. Minimal difficulty - difficulty  in some environments (e.g. when person speaks softly or setting is noisy)   Expression of Ideas and Wants Expression of Ideas and Wants: 3. Some difficulty - exhibits some difficulty with expressing needs and ideas (e.g, some words or finishing thoughts) or speech is not clear   Understanding Verbal and Non-Verbal Content Understanding Verbal and Non-Verbal Content: 3. Usually understands - understands most conversations, but misses some part/intent of message. Requires cues at times to understand  Memory/Recall Ability Memory/Recall Ability : That he or she is in a hospital/hospital unit    Bedside Swallowing Assessment General Date of Onset: 08/05/22 Previous Swallow Assessment: OP MBS on 1/11: recommended regular textures with thin liquids Diet Prior to this Study: Regular Temperature Spikes Noted: No History of Recent Intubation: No Behavior/Cognition: Alert;Cooperative;Distractible Oral Cavity - Dentition: Poor condition;Missing dentition Self-Feeding Abilities: Able to feed self;Needs assist Patient Positioning: Upright in bed Baseline Vocal Quality: Hoarse Volitional Cough: Strong Volitional Swallow: Able to elicit  Ice Chips Ice chips: Not tested Thin Liquid Thin Liquid: Within functional limits Presentation: Cup Nectar Thick Nectar Thick Liquid: Not tested Honey Thick Honey Thick Liquid: Not tested Puree Puree: Within functional limits Presentation: Self Fed;Spoon Solid Solid: Impaired Presentation: Self Fed;Spoon Oral Phase Impairments: Impaired mastication Oral Phase Functional Implications: Oral residue Pharyngeal Phase Impairments: Multiple swallows;Cough - Immediate BSE Assessment Suspected Esophageal Findings Suspected Esophageal Findings: Belching Risk for Aspiration Impact on safety and  function: Mild aspiration risk;Moderate aspiration risk Other Related Risk Factors: Cognitive impairment;History of esophageal-related issues;History of dysphagia  Short Term Goals: Week 1: SLP Short Term Goal 1 (Week 1): Pt will utilize external aids to recall daily information with mod assist multimodal cues. SLP Short Term Goal 2 (Week 1): Pt will complete basic, famliar tasks with mod assist for functional problem solving. SLP Short Term Goal 3 (Week 1): Pt will recognize and correct errors when completing functional tasks with mod assist multimodal cues. SLP Short Term Goal 4 (Week 1): Patient will consume current diet with minimal overt s/s of aspiration and Min verbal cues for use of swallowing compensatory strategies.  Refer to Care Plan for Long Term Goals  Recommendations for other services: None   Discharge Criteria: Patient will be discharged from SLP if patient refuses treatment 3 consecutive times without medical reason, if treatment goals not met, if there is a change in medical status, if patient makes no progress towards goals or if patient is discharged from hospital.  The above assessment, treatment plan, treatment alternatives and goals were discussed and mutually agreed upon: by patient and by family  Carlos Shields 08/16/2022, 3:17 PM

## 2022-08-16 NOTE — Plan of Care (Signed)
  Problem: RH Swallowing Goal: LTG Patient will consume least restrictive diet using compensatory strategies with assistance (SLP) Description: LTG:  Patient will consume least restrictive diet using compensatory strategies with assistance (SLP) Flowsheets (Taken 08/16/2022 1530) LTG: Pt Patient will consume least restrictive diet using compensatory strategies with assistance of (SLP): Supervision Goal: LTG Patient will participate in dysphagia therapy to increase swallow function with assistance (SLP) Description: LTG:  Patient will participate in dysphagia therapy to increase swallow function with assistance (SLP) Flowsheets (Taken 08/16/2022 1530) LTG: Pt will participate in dysphagia therapy to increase swallow function with assistance of (SLP): Supervision

## 2022-08-16 NOTE — Care Management (Signed)
[facility] Individual Statement of Services  Patient Name:  Carlos Shields  Date:  08/16/2022  Welcome to the [facility].  Our goal is to provide you with an individualized program based on your diagnosis and situation, designed to meet your specific needs.  With this comprehensive rehabilitation program, you will be expected to participate in at least 3 hours of rehabilitation therapies Monday-Friday, with modified therapy programming on the weekends.  Your rehabilitation program will include the following services:  Physical Therapy (PT), Occupational Therapy (OT), 24 hour per day rehabilitation nursing, Therapeutic Recreaction (TR), Psychology, Neuropsychology, Care Coordinator, Rehabilitation Medicine, [place], and Other  Weekly team conferences will be held on Tuesdays to discuss your progress.  Your Inpatient Rehabilitation Care Coordinator will talk with you frequently to get your input and to update you on team discussions.  Team conferences with you and your family in attendance may also be held.  Expected length of stay: 10-12 days    Overall anticipated outcome: Supervision  Depending on your progress and recovery, your program may change. Your Inpatient Rehabilitation Care Coordinator will coordinate services and will keep you informed of any changes. Your Inpatient Rehabilitation Care Coordinator's name and contact numbers are listed  below.  The following services may also be recommended but are not provided by the [facility] will be made to provide these services after discharge if needed.  Arrangements include referral to agencies that provide these services.  Your insurance has been verified to be:  [facility] Medicare  Your primary doctor is:  Raelyn Number  Pertinent information will be shared with your doctor and your insurance company.  Inpatient Rehabilitation Care Coordinator:  Cathleen Corti [phone] or ([phone]  Information discussed with and copy given to patient by: Rana Snare, 08/16/2022, 9:03 AM

## 2022-08-16 NOTE — Progress Notes (Signed)
Inpatient Rehabilitation  Patient information reviewed and entered into eRehab system by Lucrecia Mcphearson Olivier Frayre, OTR/L, Rehab Quality Coordinator.   Information including medical coding, functional ability and quality indicators will be reviewed and updated through discharge.   

## 2022-08-16 NOTE — Progress Notes (Signed)
Patient in therapy. Updated board and book with education. Will attempt to see again. Suction in place, notified nurse that needs seizure pads in place.

## 2022-08-16 NOTE — Progress Notes (Signed)
Occupational Therapy Session Note  Patient Details  Name: Carlos Shields MRN: [id number] Date of Birth: 10/25/1930 Session 1: Today's Date: 08/16/2022 OT Individual Time: 2355-7322 OT Individual Time Calculation (min): 66 min   Session 2: OT Individual Time: 0254-2706 OT Individual Time Calculation (min): 73 min   Short Term Goals: Week 1:  OT Short Term Goal 1 (Week 1): Pt will perform toilet transfers with LRAD with CGA OT Short Term Goal 2 (Week 1): Pt will perform 3/3 toileting tasks with no more than CGA OT Short Term Goal 3 (Week 1): Pt will improve RUE MMT to 3/5 OT Short Term Goal 4 (Week 1): Pt will perform UB/LB dress with CGA  Skilled Therapeutic Interventions/Progress Updates:    Session 1: Patient agreeable to participate in OT session. Reports 0/10 pain level.   Patient participated in skilled OT session focusing on ADL re-training, standing balance, and functional transfers while participating in shower activity. Therapist educated pt on proper sit to stand technique and standing posture due to posture lean in order to improve standing balance when completing self care tasks and functional transfers. Pt provided with mod assist during sit to stand and stand and sqt pvt transfer (no device) when transitioning from bed to w/c. Due to severe posture lean whenever pt participated in standing task; both in shower and during transfers; OT provided VC to correct standing posture such as look up and forward, tuck buttocks under hips, squeeze glutes to engage and stand with feet under hips. Pt actively attempted to make recommended adjustments to standing posture which in turn decreased amount of physical assist needed to maintain standing balance to Min A. Pt unable to maintain correct standing posture and will need further practice to increase carry over during therapy sessions.  Pt complete bathing in shower requiring VC for task sequencing in order to wash all necessary parts. Pt  perseverated on washing perineal area. Grooming completed at sink while seated with Set-up. Min A provided for UB dressing. Total A provided for LB dressing.   Session 2: Patient agreeable to participate in OT session. Reports 0/10 pain level.   Patient participated in skilled OT session focusing on self feeding, motor planning and coordination, standing balance/posture.   Pt sitting in room with NT in the middle of eating lunch upon therapy arrival. OT took over for NT during pt's meal to provide full supervision per SLP swallow recommendation. Required physical assist to open plastic container containing puree peaches. Pt held spoon with right hand using a multiple finger grasp. Utilized wrist supination movement to scoop food onto spoon and bring to mouth.   Pt participated in seated and standing activities utilizing RW and placement/removal of Squigz on a vertical surface to facilitate improved sitting and standing posture, strengthening of posterior chain, and increase pt's overall sitting and standing balance when participating in BADL tasks. OT provided pt with verbal cues for hand placement on RW and chair, to lean forward prior to standing and to bring chest upright, look up and forward, and squeeze the glutes to achieve an upright standing posture. Min A-min guard provided for balance while standing. VC provided to move feet out to hip width to decrease narrow BOS. RW was switched out with a shorter size to allow for better standing balance.  Seated ball toss and kicking activity completed to increase motor planning and coordination of the right UE and to decrease pt's risk of falls by widening his BOS and opening up his posterior chain.  Therapy Documentation Precautions:  Precautions Precautions: Fall, Other (comment) Precaution Comments: incontinent, R hemi, seizure Restrictions Weight Bearing Restrictions: No   Therapy/Group: Individual Therapy  Ailene Ravel, OTR/L,CBIS   Supplemental OT - [facility] and [facility] Secure Chat Preferred   08/16/2022, 7:55 AM

## 2022-08-16 NOTE — Progress Notes (Signed)
Inpatient Rehabilitation Care Coordinator Assessment and Plan Patient Details  Name: Carlos Shields MRN: [id number] Date of Birth: 10-Jan-1931  Today's Date: 08/16/2022  Hospital Problems: Principal Problem:   CVA (cerebrovascular accident) [facility])  Past Medical History:  Past Medical History:  Diagnosis Date   CKD (chronic kidney disease) stage 3, GFR 30-59 ml/min (HCC)    Gait abnormality    Hypertension    Pancreatitis    Seizures ([place])    Venous insufficiency    Past Surgical History:  Past Surgical History:  Procedure Laterality Date   HERNIA REPAIR     TRANSCAROTID ARTERY REVASCULARIZATION  Left 08/12/2022   Procedure: Transcarotid Artery Revascularization;  Surgeon: Waynetta Sandy, MD;  Location: [place];  Service: Vascular;  Laterality: Left;   Social History:  reports that he has been smoking cigars. He has never used smokeless tobacco. He reports that he does not drink alcohol and does not use drugs.  Family / Support Systems Marital Status: Divorced How Long?: 20+ yrs Patient Roles: Parent Spouse/Significant Other: Divorced Children: Two children. I dtr living- Carlos Shields. Son passed away two weeks ago. Other Supports: None reported Anticipated Caregiver: Dtr Carlos Shields Ability/Limitations of Caregiver: Dtr reports she is able to assist with all care needs, however, not comfortable with self-care. Her brother assisted with self-care needs, however, he has since passed. Caregiver Availability: 24/7 Family Dynamics: Pt lives with his dtr.  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 Education: [place] - How often do you need to have someone help you when you read instructions, pamphlets, or other written material from your doctor or pharmacy?: Never Writes: Yes Employment Status: Retired Date Retired/Disabled/Unemployed: Pt is unable to  recall Public relations account executive Issues: Denies Guardian/Conservator: N/A   Abuse/Neglect Abuse/Neglect Assessment Can Be Completed: Yes Physical Abuse: Denies Verbal Abuse: Denies Sexual Abuse: Denies Exploitation of patient/patient's resources: Denies Self-Neglect: Denies  Patient response to: Social Isolation - How often do you feel lonely or isolated from those around you?: Never  Emotional Status Pt's affect, behavior and adjustment status: Pt in good spirits at time of visit. Slightly confused. Recent Psychosocial Issues: Denies Psychiatric History: Denies Substance Abuse History: Pt reports he quit smoking cigars 4-5 yrs ago. Quit drinking alcohol 2 yrs ago- states he just decided to quit. Denies rec drug use.  Patient / Family Perceptions, Expectations & Goals Pt/Family understanding of illness & functional limitations: Pt and family have a general understanding of pt care needs Premorbid pt/family roles/activities: Independent Anticipated changes in roles/activities/participation: Assistance with ADLs/IADLs Pt/family expectations/goals: Pt is unable to identify any goals. Pt dtr would like for him to be able to complete his own self-care needs as she is not comfortable.  Community Resources [employer] Scripts: None Premorbid Home Care/DME Agencies: None Transportation available at discharge: TBD Is the patient able to respond to transportation needs?: Yes In the past 12 months, has lack of transportation kept you from medical appointments or from getting medications?: No In the past 12 months, has lack of transportation kept you from meetings, work, or from getting things needed for daily living?: No Resource referrals recommended: Neuropsychology  Discharge Planning Living Arrangements: Children Support Systems: Children Type of Residence: Private residence Insurance Resources: Multimedia programmer (specify) ([employer] Medicare) Financial Resources: Charity fundraiser Screen Referred: No Living Expenses: Lives with family Money Management: Family Does the patient have any problems obtaining your medications?: No Home Management: Pt  and his dtr help manage meals and home cleaning. Patient/Family Preliminary Plans: TBD Care Coordinator Barriers to Discharge: Decreased caregiver support, Lack of/limited family support Care Coordinator Anticipated Follow Up Needs: HH/OP Expected length of stay: 10-12 days  Clinical Impression SW met with pt in room to introduce self, explain role, and discuss discharge process. Pt is not a English as a second language teacher. No HCPOA. No DME. Pt aware SW spoke with his dtr [name].   32- SW spoke with pt dtr Roseline to introduce self, explain role, and discuss discharge process.She confirms it is just the two of them since her brother recently passed away (two weeks ago). She reports she does not want to do personal care, however, will work it out if she has too. She is aware SW will follow-up with  updates after team conference.   Carlos Shields A Carlos Shields 08/16/2022, 3:22 PM

## 2022-08-17 MED ORDER — POLYETHYLENE GLYCOL 3350 17 G PO PACK
17.0000 g | PACK | Freq: Every day | ORAL | Status: DC
  Administered 2022-08-17 – 2022-08-27 (×10): 17 g via ORAL
  Filled 2022-08-17 (×10): qty 1

## 2022-08-17 MED ORDER — SENNOSIDES-DOCUSATE SODIUM 8.6-50 MG PO TABS
1.0000 | ORAL_TABLET | Freq: Two times a day (BID) | ORAL | Status: DC
  Administered 2022-08-17 – 2022-08-27 (×19): 1 via ORAL
  Filled 2022-08-17 (×21): qty 1

## 2022-08-17 NOTE — Progress Notes (Signed)
Patient ID: Carlos Shields, male   DOB: April 17, 1931, 89 y.o.   MRN: [id number]  SW met with pt in room to provide updates from team conference, and d/c date 2/16.  1748- SW returned phone call to [name] but voicemail is not set up. *SW spoke with [name] to provide updates from team conference, and d/c date 2/16. SW discussed family education. Scheduled for 2/13 (Tues) 1pm-4pm. Reports she will likely be here tomorrow since she has been sick.   Loralee Pacas, MSW, [place] Office: [phone] Cell: [phone] Fax: [phone]

## 2022-08-17 NOTE — Progress Notes (Signed)
Occupational Therapy Session Note  Patient Details  Name: Carlos Shields MRN: [id number] Date of Birth: [birth year]  Today's Date: 08/17/2022 OT Individual Time: 7628-3151 OT Individual Time Calculation (min): 89 min    Short Term Goals: Week 1:  OT Short Term Goal 1 (Week 1): Pt will perform toilet transfers with LRAD with CGA OT Short Term Goal 2 (Week 1): Pt will perform 3/3 toileting tasks with no more than CGA OT Short Term Goal 3 (Week 1): Pt will improve RUE MMT to 3/5 OT Short Term Goal 4 (Week 1): Pt will perform UB/LB dress with CGA  Skilled Therapeutic Interventions/Progress Updates:    Pt greeted sitting in wc and agreeable to OT treatment session. Pt declined need to wash up or change clothes, stating he got dressed in earlier session. Pt brought to therpay gym in wc. Addressed R visual scanning, R fine motor coordination, and problem solving with small oeg board puzzle in diagonal pattern. Pt needed moderate cues to identify mistakes mostly notable in diagonal plane. Pt also needed moderate cues to use only R UE as he frequently tried to assist with L hand. OT had pt then use tweezers to remove each peg and place back in container. Pt needed  extended time to complete this task as fine motor and gross motor movements are extremely slow, although he did stay on task. Pt needed facilitation to maintain forward posture. Peg board also placed on dysom to decrese board from moving. Sit<>stands with min A and cues + facilitation for anterior weight shift. OT then handed pt a 1 lb weighted bar for UB there-ex and balance challenge. Pt completed 3 sets of 10 chest press and bicep curl with min A for balance and cues for posterior bias. Pt returned to room and declined need to be toileted despite encouragement. Pt set-up with lunch tray. OT encouraged use of spoon for feeding utensil, pt agreeable. Worked on R hand [place] to scoop food bolus and bring to mouth. Pt did not have any choking spells on  dysphagia 2 diet and the use  of the spoon slowed pt down enough that he took appropriate sized bites. Pt left seated in cw at end of session with alarm belt on, call bell in reach, and needs met.   Therapy Documentation Precautions:  Precautions Precautions: Fall, Other (comment) Precaution Comments: incontinent, R hemi, seizure Restrictions Weight Bearing Restrictions: No Pain: Pain Assessment Pain Scale: 0-10 Pain Score: 0-No pain   Therapy/Group: Individual Therapy  Valma Cava 08/17/2022, 11:46 AM

## 2022-08-17 NOTE — Patient Care Conference (Signed)
Inpatient RehabilitationTeam Conference and Plan of Care Update Date: 08/17/2022   Time: 10:40 AM   Patient Name: Carlos Shields      Medical Record Number: [id number]  Date of Birth: Feb 02, 1931 Sex: Male         Room/Bed: 4W12C/4W12C-02 Payor Info: Payor: Theme park manager MEDICARE / Plan: [facility] MEDICARE / Product Type: *No Product type* /    Admit Date/Time:  08/14/2022  2:58 PM  Primary Diagnosis:  CVA (cerebrovascular accident) [facility])  Hospital Problems: Principal Problem:   CVA (cerebrovascular accident) [facility])    Expected Discharge Date: Expected Discharge Date: 08/27/22  Team Members Present: Physician leading conference: Dr. [name] Social Worker Present: Loralee Pacas, Reading Nurse Present: Tacy Learn, RN PT Present: Terence Lux, PT OT Present: Cherylynn Ridges, OT SLP Present: Weston Anna, SLP     Current Status/Progress Goal Weekly Team Focus  Bowel/Bladder   Pt is continent/incontinent of bowel/bladder   Pt will become continent of bowel/bladder   Will assess qshift and PRN    Swallow/Nutrition/ Hydration   Dys. 2 textures with thin liquids, Mod A   Supervision  tolerance of current diet, use of swallowing compensatory strategies    ADL's   Min-mod A overall for self care and transfers   supervision   self-care retraining, balance, activity tolerance, transfers    Mobility   SBA for bed mobility, MinA for sit/stands secondary to posterior bias, CGA for transfers and gait >150' with RW, MinA for stairs   Supv/CGA  NMR, dynamic stability, sit/stand, transfers, gait, education, discharge planning, etc.    Communication                Safety/Cognition/ Behavioral Observations  Mod-Max A   Min A   functional problem solving, recall and emergent awareness    Pain   Pt denies pain   Pt will continue to deny pain   Will assess qshift and PRN    Skin   Pt's neck and groin incisions are intact and healing   Pt's incisons will stay intact   Will assess qshift and PRN      Discharge Planning:  D/c to home with his dtr Carlos Shields who will provide 24/7 care. She is not comfortable with self-care. SHe is hopeful her dad will be able to do on his own. SW will confirm there are no barriers to discharge.   Team Discussion: CVA. Incontinent/continent B/B improving with time toileting. Denies pain. Incision to right groin is without drainage. Incision to neck is intact without drainage. Seizure padding in place. Suction in place. Encourage fludis. Monitor Na.Myra Rude. Mod A for LB dressing. MinA for UB dressing. MinA for transfers. Diet downgraded from HH/Thin to D2/Thin. Full supervision with meals and cueing to reduce impulsiveness.  Patient on target to meet rehab goals: yes, supervision to [place] and progress notes for long and short-term goals.   Revisions to Treatment Plan:  Medication adjustments, downgrade diet, time toileting, monitor labs, encourage fluids  Teaching Needs: Medications, safety, self care, gait/transfer training, skin care, etc.   Current Barriers to Discharge: Decreased caregiver support, Incontinence, and Medication compliance  Possible Resolutions to Barriers: Family education, nursing education, order recommended DME     Medical Summary Current Status: medically complicated by urinary incontinence, constipation, cognitive deficits,hyponatremia, AKI, anemia  Barriers to Discharge: Electrolyte abnormality;Inadequate Nutritional Intake;Incontinence;Medical stability;Self-care education  Barriers to Discharge Comments: urinary incontinence using condom catheter, cognitive deficits, dehydration Possible Resolutions to [employer] Focus: medications and timed  toiletting for urinary issues, encouraging fluids   Continued Need for Acute Rehabilitation Level of Care: The patient requires daily medical management by a physician with specialized training in physical medicine and rehabilitation for  the following reasons: Direction of a multidisciplinary physical rehabilitation program to maximize functional independence : Yes Medical management of patient stability for increased activity during participation in an intensive rehabilitation regime.: Yes Analysis of laboratory values and/or radiology reports with any subsequent need for medication adjustment and/or medical intervention. : Yes   I attest that I was present, lead the team conference, and concur with the assessment and plan of the team.   Ernest Pine 08/17/2022, 2:23 PM

## 2022-08-17 NOTE — Progress Notes (Signed)
Physical Therapy Session Note  Patient Details  Name: Carlos Shields MRN: [id number] Date of Birth: 1931-02-20  Today's Date: 08/17/2022 PT Individual Time: 0905-1000 PT Individual Time Calculation (min): 55 min  Today's Date: 08/17/2022 PT Missed Time: 5 Minutes Missed Time Reason: Other (Comment) (Breakfast)  Short Term Goals: Week 1:  PT Short Term Goal 1 (Week 1): Pt will require CGA for dynamic standing balance with LRAD PT Short Term Goal 2 (Week 1): Pt will require CGA with gait x 50 ft with LRAD PT Short Term Goal 3 (Week 1): Pt will require min A for stair navigation x 4 with 2 HR's  Skilled Therapeutic Interventions/Progress Updates:  Patient greeted supine in bed and agreeable to PT treatment session, however not done eating his breakfast. Therapist provided full supervision throughout meal- Patient used his R UE for feeding himself and required increased time to complete. Patient transitioned from long-sitting in the bed to sitting EOB with use of bed rail and SBA. While sitting EOB, therapist threaded pants. Patient stood from EOB with RW and MinA for increased anterior lean as patient has a posterior bias- VC for proper hand placement with poor carryover noted throughout treatment session. While standing, therapist pulled pants over hips. Patient gait trained >150' from room to rehab gym with RW and CGA for safety- VC for improved postural extension, increased BOS, increased step length bilaterally and stepping within the frame of the RW.   Patient stood on airex foam with L UE support on RW while reaching for squigz on mirror in front of him in order to facilitate improved postural extension. Patient reached for x15 squigz and then placed them in the container on his R with MinA for improved stability as patient demonstrated posterior lean. VC for shifting hips anteriorly for improved overall stability.   Patient gait trained back to his room from day room (>150') with RW and CGA/SBA for  safety- VC throughout for improved gait mechanics as described above. With therapist advancing RW, patient demonstrated increased cadence however unable to sustain without therapist's assistance.   Patient left sitting upright in wheelchair in room with posey belt on, call bell within reach and all needs met.    Therapy Documentation Precautions:  Precautions Precautions: Fall, Other (comment) Precaution Comments: incontinent, R hemi, seizure Restrictions Weight Bearing Restrictions: No   Therapy/Group: Individual Therapy  Jaylanni Eltringham 08/17/2022, 7:59 AM

## 2022-08-17 NOTE — Progress Notes (Signed)
Physical Therapy Session Note  Patient Details  Name: Carlos Shields MRN: [id number] Date of Birth: [birth year]  Today's Date: 08/17/2022 PT Individual Time: 4818-5631 PT Individual Time Calculation (min): 57 min   Short Term Goals: Week 1:  PT Short Term Goal 1 (Week 1): Pt will require CGA for dynamic standing balance with LRAD PT Short Term Goal 2 (Week 1): Pt will require CGA with gait x 50 ft with LRAD PT Short Term Goal 3 (Week 1): Pt will require min A for stair navigation x 4 with 2 HR's  Skilled Therapeutic Interventions/Progress Updates:      Therapy Documentation Precautions:  Precautions Precautions: Fall, Other (comment) Precaution Comments: incontinent, R hemi, seizure Restrictions Weight Bearing Restrictions: No  Pt received seated in w/c at bedside, agreeable to PT session with emphasis on posterior chain strength activation.   Pt without verbal reports of pain in session.   Pt min A for sit to stand and CGA gait ~120 ft to dayroom with RW. PT provided mod verbal cue " big" to increase step length and speed.   Initially pt required min A and faded to close (S) for sit to stand with no AD/UE support with mod/max tactile cues at gluteal musculature to increase posterior chain activation. Pt particpated in blocked practice of transfers x 8 with visual feedback by mirror to achieve upright posture.   Pt transitioned to gluteal bridges 3 x 10 with 2-3 second holds followed by 1 x 5 with 5 second holds with mod tactile cues to faciliate increased gluteal activation.   Pt transitioned to gait training ~120 ft with RW CGA with decreased gait speed. Pt unable to increase speed with verbal cues via commands and attempted pt try to ambulate with music for pacing. Pt continues to present with decreased step length, shuffle gait, trunk flexion and rigidity.   Pt left seated in w/c at bedside with chair alarm on and all needs within reach.     Therapy/Group: Individual  Therapy  Verl Dicker Verl Dicker PT, DPT  08/17/2022, 1:34 PM

## 2022-08-17 NOTE — Progress Notes (Signed)
PROGRESS NOTE   Subjective/Complaints:  No acute complaints. No events overnight. Endorses improved urinary continence, no longer using the condom catheter. Slept well.    ROS: Denies fevers, chills, N/V, abdominal pain, constipation, diarrhea, SOB, cough, chest pain, new weakness or paraesthesias.    Objective:   No results found. Recent Labs    08/15/22 0647  WBC 6.3  HGB 8.3*  HCT 25.6*  PLT 187    Recent Labs    08/14/22 1522 08/15/22 0647  NA 134* 132*  K 4.2 4.4  CL 103 104  CO2 22 18*  GLUCOSE 97 92  BUN 28* 32*  CREATININE 1.64* 1.61*  CALCIUM 8.3* 8.5*     Intake/Output Summary (Last 24 hours) at 08/17/2022 0824 Last data filed at 08/17/2022 0032 Gross per 24 hour  Intake 417 ml  Output 425 ml  Net -8 ml         Physical Exam: Vital Signs Blood pressure 115/64, pulse 83, temperature 99.1 F (37.3 C), temperature source Oral, resp. rate 16, height 5\' 4"  (1.626 m), weight 53.3 kg, SpO2 99 %.  Constitutional: No distress . Vital signs reviewed. HEENT: NCAT, EOMI, oral membranes moist Neck: supple Cardiovascular: RRR without murmur. No JVD    Respiratory/Chest: CTA Bilaterally without wheezes or rales. Normal effort    GI/Abdomen: BS +, non-tender, non-distended Ext: no clubbing, cyanosis, or edema Psych: pleasant and cooperative  GU: No further bag at bedside Skin: TCAR site L neck c.d.I, dermabonded MSK:      No apparent deformity.      Strength: Antigravity and against resistance in all 4 extremities   Neurologic exam:  Cognition: AAO to person, place; year with options.  + memory deficits Coordination: + RUE ataxia; ?RLE apraxia--mild, ongoing    Assessment/Plan: 1. Functional deficits which require 3+ hours per day of interdisciplinary therapy in a comprehensive inpatient rehab setting. Physiatrist is providing close team supervision and 24 hour management of active medical  problems listed below. Physiatrist and rehab team continue to assess barriers to discharge/monitor patient progress toward functional and medical goals  Care Tool:  Bathing    Body parts bathed by patient: Right arm, Left arm, Chest, Abdomen, Right upper leg, Left upper leg, Face, Front perineal area   Body parts bathed by helper: Buttocks, Right lower leg, Left lower leg     Bathing assist Assist Level: Moderate Assistance - Patient 50 - 74%     Upper Body Dressing/Undressing Upper body dressing   What is the patient wearing?: Pull over shirt    Upper body assist Assist Level: Minimal Assistance - Patient > 75%    Lower Body Dressing/Undressing Lower body dressing      What is the patient wearing?: Pants     Lower body assist Assist for lower body dressing: Moderate Assistance - Patient 50 - 74%     Toileting Toileting    Toileting assist Assist for toileting: Moderate Assistance - Patient 50 - 74%     Transfers Chair/bed transfer  Transfers assist     Chair/bed transfer assist level: Minimal Assistance - Patient > 75%     Locomotion Ambulation   Ambulation assist  Assist level: Minimal Assistance - Patient > 75% Assistive device: Walker-rolling Max distance: 150 ft   Walk 10 feet activity   Assist     Assist level: Minimal Assistance - Patient > 75% Assistive device: Walker-rolling   Walk 50 feet activity   Assist    Assist level: Minimal Assistance - Patient > 75% Assistive device: Walker-rolling    Walk 150 feet activity   Assist    Assist level: Minimal Assistance - Patient > 75% Assistive device: Walker-rolling    Walk 10 feet on uneven surface  activity   Assist     Assist level: Moderate Assistance - Patient - 50 - 74% Assistive device: Aeronautical engineer Is the patient using a wheelchair?: Yes Type of Wheelchair: Manual    Wheelchair assist level: Maximal Assistance - Patient 25 -  49% Max wheelchair distance: 5 ft    Wheelchair 50 feet with 2 turns activity    Assist        Assist Level: Total Assistance - Patient < 25%   Wheelchair 150 feet activity     Assist      Assist Level: Total Assistance - Patient < 25%   Blood pressure 115/64, pulse 83, temperature 99.1 F (37.3 C), temperature source Oral, resp. rate 16, height 5\' 4"  (1.626 m), weight 53.3 kg, SpO2 99 %.  Medical Problem List and Plan: 1. Functional deficits secondary to multiple small acute ischemic CVA infarcts involving the left basal ganglia, left frontal, right occipital cortex and adjacent white matter              -patient may shower             -ELOS/Goals: 10-12 days, supervision/CGA PT, supervision OT, supervision SLP; DC 2/16  -Continue CIR therapies including PT, OT, SLP  2.  Antithrombotics: -DVT/anticoagulation: Lovenox 30 mg daily  -antiplatelet therapy: DAPT ASA 81mg  QD + Plavix 75mg  QD x3 weeks then Plavix alone (~08/29/22) 3. Pain Management: tylenol PRN, oxy 5 mg Q4H PRN.  4. Mood/Behavior/Sleep: Trazodone 25-50 mg PRN             -antipsychotic agents: none 5. Neuropsych/cognition: This patient is capable of making decisions on his own behalf. 6. Skin/Wound Care: Standard skin assessments, wound care.  - Would encourage removal of condom catheter to avoid skin  - breakdown - removed 2/6; incontinent, PVRs QID and times toiletting Q4H  7. Fluids/Electrolytes/Nutrition: Dysphagia. Heart healthy diet, thins.   - Admission labs stable, mild decrease in NA - repeat wednesday  - 2/5 encourage fluids  - 2/6: Full supervision with D2 thins per SLP d/t choking on food, impulsive eating. Meds crushed. Per SLP was modifying diet at home PTA.   8. L internal Carotid Artery stenosis s/p TCAR 08/12/22, continue ASA/Plavix/Crestor             -f/up in 4 weeks with carotid duplex with Dr. Donzetta Matters  9. Seizures: continue Keppra 500mg  BID 10. HTN: continue Norvasc 5mg  QD, held home  Metoprolol 50mg  BID during hospitalization but restarted at 12.5mg  BID -Per neurology, allowed for permissive HTN (ok if <220/120) but gradually normalize by 08/14/22, long term normotensive goal    08/17/2022    4:36 AM 08/16/2022    7:51 PM 08/16/2022    1:00 PM  Vitals with BMI  Systolic 474 259 563  Diastolic 64 73 63  Pulse 83 77 66   - normotensive; continue current regimen   11. Acute  on Chronic CKD stage 3a: baseline Cr ~1.2, peaked at 2.1, renal U/S 08/06/22 showing chronic medical renal disease but no hydronephrosis             -Avoid nephrotoxic agents, monitoring UOP   - 2/5 labs: Cr 1.6; stable, BUN sl up---push fluids - repeat Wed  12. Chronic normocytic anemia: baseline hgb 9-10, monitor CBC             -Hgb down to 8.2 after TCAR, stable at 8.0 on 08/13/22; 8.3 2/4 13. Abnormal TSH: TSH 4.9 on admission 08/05/22, free T4 0.88; WNL.    - F/u with PCP 14. Hyponatremia: improved during hospitalization with hydration, monitor BMP   - Decreased 134 -> 132 2/4; repeat lads perform Wed AM    LOS: 3 days A FACE TO [place] 08/17/2022, 8:24 AM

## 2022-08-17 NOTE — IPOC Note (Signed)
Overall Plan of Care [facility]) Patient Details Name: Carlos Shields MRN: [id number] DOB: [birth year]  Admitting Diagnosis: CVA (cerebrovascular accident) [facility])  Hospital Problems: Principal Problem:   CVA (cerebrovascular accident) [facility])     Functional Problem List: Nursing Motor, Skin Integrity, Bowel, Pain, Edema, Safety, Medication Management, Endurance, Sensory  PT Balance, Perception, Safety, Endurance, Motor  OT Balance, Cognition, Endurance, Motor, Perception, Safety  SLP Cognition  TR         Basic ADL's: OT Grooming, Bathing, Dressing, Toileting     Advanced  ADL's: OT       Transfers: PT Bed Mobility, Bed to Chair, Teacher, early years/pre, Tub/Shower     Locomotion: PT Ambulation, Wheelchair Mobility     Additional Impairments: OT Fuctional Use of Upper Extremity  SLP Social Cognition   Problem Solving, Memory, Awareness  TR      Anticipated Outcomes Item Anticipated Outcome  Self Feeding Indep  Swallowing  Supervision   Basic self-care  Supervision  Toileting  Supervision   Bathroom Transfers Supervision  Bowel/Bladder  Improve constipation; incontinence baseline  Transfers  Supervison  Locomotion  CGA  Communication     Cognition  min assist  Pain  less than 3  Safety/Judgment  remain fall free while in rehab   Therapy Plan: PT Intensity: Minimum of 1-2 x/day ,45 to 90 minutes PT Frequency: 5 out of 7 days PT Duration Estimated Length of Stay: 12-14 days OT Intensity: Minimum of 1-2 x/day, 45 to 90 minutes OT Frequency: 5 out of 7 days OT Duration/Estimated Length of Stay: 10-12 days SLP Intensity: Minumum of 1-2 x/day, 30 to 90 minutes SLP Frequency: 3 to 5 out of 7 days SLP Duration/Estimated Length of Stay: 10-12 days   Team Interventions: Nursing Interventions Patient/Family Education, Bowel Management, Pain Management, Skin Care/Wound Management, Psychosocial Support, Disease Management/Prevention, Medication Management, Cognitive  Remediation/Compensation, Discharge Planning  PT interventions Ambulation/gait training, Cognitive remediation/compensation, Discharge planning, DME/adaptive equipment instruction, Functional mobility training, UE/LE Strength taining/ROM, Therapeutic Activities, Pain management, Psychosocial support, Wheelchair propulsion/positioning, UE/LE Coordination activities, Therapeutic Exercise, Stair training, Patient/family education, Skin care/wound management, Neuromuscular re-education, Functional electrical stimulation, Disease management/prevention, Academic librarian, Training and development officer  OT Interventions Training and development officer, Functional electrical stimulation, Self Care/advanced ADL retraining, UE/LE Coordination activities, Cognitive remediation/compensation, Functional mobility training, Visual/perceptual remediation/compensation, Academic librarian, Neuromuscular re-education, Wheelchair propulsion/positioning, Splinting/orthotics, Discharge planning, Therapeutic Activities, Disease mangement/prevention, Patient/family education, Therapeutic Exercise, DME/adaptive equipment instruction, UE/LE Strength taining/ROM, Psychosocial support  SLP Interventions Dysphagia/aspiration precaution training, Internal/external aids, Cueing hierarchy, Environmental controls, Therapeutic Activities, Functional tasks, Patient/family education  TR Interventions    SW/CM Interventions Discharge Planning, Psychosocial Support, Patient/Family Education   Barriers to Discharge MD  Medical stability, Home enviroment access/loayout, Incontinence, Lack of/limited family support, and Medication compliance  Nursing Decreased caregiver support, Home environment access/layout, Medication compliance home with daughter 1 level with 1 ste no rail  PT Incontinence house, 1 level with 1 step to enter with left rail and dtr able to provide 24/7  OT Incontinence    SLP      SW Decreased caregiver support,  Lack of/limited family support     Team Discharge Planning: Destination: PT-Home ,OT- Home , SLP-Home Projected Follow-up: PT-Home health PT, OT-  Outpatient OT, SLP-Home Health SLP, 24 hour supervision/assistance Projected Equipment Needs: PT-To be determined, OT- To be determined, SLP-None recommended by SLP Equipment Details: PT-owns RW, OT-  Patient/family involved in discharge planning: PT- Patient, Family member/caregiver,  OT-Patient, SLP-Patient, Family member/caregiver  MD ELOS: 12-14 days Medical  Rehab Prognosis:  Good Assessment: The patient has been admitted for CIR therapies with the diagnosis of Multifocal bilateral CVAs. The team will be addressing functional mobility, strength, stamina, balance, safety, adaptive techniques and equipment, self-care, bowel and bladder mgt, patient and caregiver education. Goals have been set at Supervision. Anticipated discharge destination is home.       See Team Conference Notes for weekly updates to the plan of care

## 2022-08-18 LAB — BASIC METABOLIC PANEL
Anion gap: 12 (ref 5–15)
BUN: 37 mg/dL — ABNORMAL HIGH (ref 8–23)
CO2: 22 mmol/L (ref 22–32)
Calcium: 9.1 mg/dL (ref 8.9–10.3)
Chloride: 104 mmol/L (ref 98–111)
Creatinine, Ser: 1.52 mg/dL — ABNORMAL HIGH (ref 0.61–1.24)
GFR, Estimated: 44 mL/min — ABNORMAL LOW (ref >60)
Glucose, Bld: 101 mg/dL — ABNORMAL HIGH (ref 70–99)
Potassium: 4.5 mmol/L (ref 3.5–5.1)
Sodium: 138 mmol/L (ref 135–145)

## 2022-08-18 LAB — CBC
HCT: 28.1 % — ABNORMAL LOW (ref 39.0–52.0)
Hemoglobin: 9.3 g/dL — ABNORMAL LOW (ref 13.0–17.0)
MCH: 29.3 pg (ref 26.0–34.0)
MCHC: 33.1 g/dL (ref 30.0–36.0)
MCV: 88.6 fL (ref 80.0–100.0)
Platelets: 231 10*3/uL (ref 150–400)
RBC: 3.17 MIL/uL — ABNORMAL LOW (ref 4.22–5.81)
RDW: 12.4 % (ref 11.5–15.5)
WBC: 7 10*3/uL (ref 4.0–10.5)
nRBC: 0 % (ref 0.0–0.2)

## 2022-08-18 MED ORDER — LEVETIRACETAM 100 MG/ML PO SOLN
500.0000 mg | Freq: Two times a day (BID) | ORAL | Status: DC
  Administered 2022-08-18 – 2022-08-26 (×16): 500 mg via ORAL
  Filled 2022-08-18 (×16): qty 5

## 2022-08-18 NOTE — Progress Notes (Signed)
Speech Language Pathology Daily Session Note  Patient Details  Name: Carlos Shields MRN: [id number] Date of Birth: 10-Apr-1931  Today's Date: 08/18/2022 SLP Individual Time: 0730-0830 SLP Individual Time Calculation (min): 60 min  Short Term Goals: Week 1: SLP Short Term Goal 1 (Week 1): Pt will utilize external aids to recall daily information with mod assist multimodal cues. SLP Short Term Goal 2 (Week 1): Pt will complete basic, famliar tasks with mod assist for functional problem solving. SLP Short Term Goal 3 (Week 1): Pt will recognize and correct errors when completing functional tasks with mod assist multimodal cues. SLP Short Term Goal 4 (Week 1): Patient will consume current diet with minimal overt s/s of aspiration and Min verbal cues for use of swallowing compensatory strategies.  Skilled Therapeutic Interventions: Pt seen this date for skilled ST intervention targeting cognitive and swallowing goals outlined above. Pt received awake and lying supine in bed. Denies pain and states that he has not eaten yet. Agreeable to intervention and getting OOB for AM meal. Transferred from bed<>w/c per safety plan though pt required more Mod vs Min A for safety and overall Mod A for bed mobility and sitting balance at EOB; PT notified.    Today's session with emphasis on diet tolerance monitoring and problem-solving within the context of  self-feeding. Pt required overall Mod A for self-feeding and adherence to aspiration precautions to include slow rate, alternating bites/sips, and use of puree and/or thin liquid to assist with oral clearance and A-P transit. SLP also mixed minced textures with puree to aid in bolus cohesion and transit given oral residuals post-swallow, which did appear to decrease soft s/sx concerning for airway intrusion. Pt noted to hold head in 45 degree flexion in w/c with no consistent/sustainable change in posture with verbal cues. Provided education on swallow strategies to  include use of purees to mix with chopped foods and following with thin liquid rinse and puree to assist with known UES dysfunction. Pt demonstrated throat clearing x 3 during meal and belching x 2. Wet vocal quality appreciated intermittently following intake of solid textures;  ? reflux related though denies hx of GERD (not currently on acid reflux medications - may need to message MD following results of MBSS). Large and prolonged coughing episode noted with thin liquid via straw. As the meal progressed and SLP faded assistance, pt successfully began to implement alternating bites/sips during intake with Sup A.  Recommend f/u of swallow function via instrumental swallow assessment to determine safety with current diet textures given baseline presbyphagia (documented on OP MBSS on 1/11) and acute CVA + limited clinical utility this date. Will plan to complete MBSS next date. In the interim, recommend downgrade to Dysphagia 1 with thin liquids with no straws; medications crushed in puree. Pt voiced agreement of plan.   Pt left in room and OOB in w/c with all safety measures activated and call bell within reach. Continue per current ST POC.   Pain Pain Assessment Pain Scale: 0-10 Pain Score: 0-No pain  Therapy/Group: Individual Therapy  Chamaine Stankus A Kalana Yust 08/18/2022, 11:47 AM

## 2022-08-18 NOTE — Progress Notes (Signed)
Physical Therapy Session Note  Patient Details  Name: Carlos Shields MRN: [id number] Date of Birth: August 17, 1930  Today's Date: 08/18/2022 PT Individual Time: 0830-0945 PT Individual Time Calculation (min): 75 min   Short Term Goals: Week 1:  PT Short Term Goal 1 (Week 1): Pt will require CGA for dynamic standing balance with LRAD PT Short Term Goal 2 (Week 1): Pt will require CGA with gait x 50 ft with LRAD PT Short Term Goal 3 (Week 1): Pt will require min A for stair navigation x 4 with 2 HR's  Skilled Therapeutic Interventions/Progress Updates:  Patient greeted sitting upright in wheelchair in room and transitioned from [place] care- Patient agreeable to PT treatment session. Prior to standing from wheelchair, RN administered morning medications. Patient stood from wheelchair with RW and MinA- VC for scooting forward, tucking feet and big anterior weight shift secondary to posterior bias. Patient ambulated ~20' to/from wheelchair and bathroom with RW and CGA- VC throughout for increased BOS and increased step length. While standing, patient was able to doff pants/brief with MinA. Patient with continent BM while sitting on the toilet and was able to perform back pericare independently. Patient stood from toilet with MinA and grab bar/RW- While standing therapist performed back pericare to make sure patient was clean. Patient was able to pull up pants/brief with MinA. Patient ambulated to the sink with RW and CGA and stood for ~5' while washing his hands and communicating with physician on her morning rounds. Patient then ambulated from his room to day room with RW and CGA/SBA- VC for stepping within the RW, increased BOS and increased step length bilaterally with good improvements noted however minor. Patient's daughter stopped by to say hello, deliver clothes and take dirty clothes.   Patient complete the Nustep 2 x 5' on level 2 with B UE/LE- Emphasis on big movements in order to facilitate carryover to  functional activity. Patient took a 3 minutes seated rest break in between sets secondary to fatigue.   Patient sat upright in arm chair and placed B hands on ball on an elevated tray table in front of him and performed roll outs in order to improve overall anterior weight shift for sit/stands. Patient performed 3 x 10 with anterior weight increasing with each set.   Patient then performed x5 sit/stands with RW and Min/CGA for anterior weight shift when initiating standing- Patient demonstrated good carryover of techniques performed with ball leading to only requiring CGA at times.   Patient gait trained back to his room with RW and CGA/SBA for safety- Patient demonstrated improved step length and cadence, however continued to require VC to sustain improvements with gait mechanics. Patient left sitting upright in wheelchair with tray table in front, call bell within reach and all needs met.    Therapy Documentation Precautions:  Precautions Precautions: Fall, Other (comment) Precaution Comments: incontinent, R hemi, seizure Restrictions Weight Bearing Restrictions: No   Therapy/Group: Individual Therapy  Lyssa Hackley 08/18/2022, 7:57 AM

## 2022-08-18 NOTE — Progress Notes (Signed)
PROGRESS NOTE   Subjective/Complaints:  No acute complaints. No events overnight. Downgraded to D1 diet this AM for coughing/choking on food. BUN mildly increased; agreeable to working on drinking 6-8 cups of water today. Denies any pain, some notable stiffness on transfering OOB per PT.    ROS: Denies fevers, chills, N/V, abdominal pain, constipation, diarrhea, SOB, cough, chest pain, new weakness or paraesthesias.    Objective:   No results found. Recent Labs    08/18/22 0721  WBC 7.0  HGB 9.3*  HCT 28.1*  PLT 231    Recent Labs    08/18/22 0721  NA 138  K 4.5  CL 104  CO2 22  GLUCOSE 101*  BUN 37*  CREATININE 1.52*  CALCIUM 9.1     Intake/Output Summary (Last 24 hours) at 08/18/2022 1545 Last data filed at 08/18/2022 1257 Gross per 24 hour  Intake 826 ml  Output 400 ml  Net 426 ml         Physical Exam: Vital Signs Blood pressure 127/66, pulse 79, temperature 97.6 F (36.4 C), temperature source Oral, resp. rate 16, height 5\' 4"  (1.626 m), weight 53.3 kg, SpO2 100 %.  Constitutional: No distress . Vital signs reviewed. HEENT: NCAT, EOMI, oral membranes moist Neck: supple Cardiovascular: RRR without murmur. No JVD    Respiratory/Chest: CTA Bilaterally without wheezes or rales. Normal effort    GI/Abdomen: BS +, non-tender, non-distended Ext: no clubbing, cyanosis, or edema Psych: pleasant and cooperative  GU: No further bag at bedside Skin: TCAR site L neck c.d.I, dermabonded MSK:      No apparent deformity.      Strength: Antigravity and against resistance in all 4 extremities. Standing with RW at sink with SPV.    Neurologic exam:  Cognition: AAO to person, place; year with options.  + significant memory deficits, + attention deficits Coordination: + RUE ataxia; ?RLE apraxia--no examined 2/7    Assessment/Plan: 1. Functional deficits which require 3+ hours per day of interdisciplinary  therapy in a comprehensive inpatient rehab setting. Physiatrist is providing close team supervision and 24 hour management of active medical problems listed below. Physiatrist and rehab team continue to assess barriers to discharge/monitor patient progress toward functional and medical goals  Care Tool:  Bathing    Body parts bathed by patient: Right arm, Left arm, Chest, Abdomen, Right upper leg, Left upper leg, Face, Front perineal area   Body parts bathed by helper: Buttocks, Right lower leg, Left lower leg     Bathing assist Assist Level: Moderate Assistance - Patient 50 - 74%     Upper Body Dressing/Undressing Upper body dressing   What is the patient wearing?: Pull over shirt    Upper body assist Assist Level: Minimal Assistance - Patient > 75%    Lower Body Dressing/Undressing Lower body dressing      What is the patient wearing?: Pants     Lower body assist Assist for lower body dressing: Moderate Assistance - Patient 50 - 74%     Toileting Toileting    Toileting assist Assist for toileting: Moderate Assistance - Patient 50 - 74%     Transfers Chair/bed transfer  Transfers assist  Chair/bed transfer assist level: Minimal Assistance - Patient > 75%     Locomotion Ambulation   Ambulation assist      Assist level: Minimal Assistance - Patient > 75% Assistive device: Walker-rolling Max distance: 150 ft   Walk 10 feet activity   Assist     Assist level: Minimal Assistance - Patient > 75% Assistive device: Walker-rolling   Walk 50 feet activity   Assist    Assist level: Minimal Assistance - Patient > 75% Assistive device: Walker-rolling    Walk 150 feet activity   Assist    Assist level: Minimal Assistance - Patient > 75% Assistive device: Walker-rolling    Walk 10 feet on uneven surface  activity   Assist     Assist level: Moderate Assistance - Patient - 50 - 74% Assistive device: Horticulturist, commercial Is the patient using a wheelchair?: Yes Type of Wheelchair: Manual    Wheelchair assist level: Maximal Assistance - Patient 25 - 49% Max wheelchair distance: 5 ft    Wheelchair 50 feet with 2 turns activity    Assist        Assist Level: Total Assistance - Patient < 25%   Wheelchair 150 feet activity     Assist      Assist Level: Total Assistance - Patient < 25%   Blood pressure 127/66, pulse 79, temperature 97.6 F (36.4 C), temperature source Oral, resp. rate 16, height 5\' 4"  (1.626 m), weight 53.3 kg, SpO2 100 %.  Medical Problem List and Plan: 1. Functional deficits secondary to multiple small acute ischemic CVA infarcts involving the left basal ganglia, left frontal, right occipital cortex and adjacent white matter              -patient may shower             -ELOS/Goals: 10-12 days, supervision/CGA PT, supervision OT, supervision SLP; DC 2/16  -Continue CIR therapies including PT, OT, SLP  2.  Antithrombotics: -DVT/anticoagulation: Lovenox 30 mg daily  -antiplatelet therapy: DAPT ASA 81mg  QD + Plavix 75mg  QD x3 weeks then Plavix alone (~08/29/22) 3. Pain Management: tylenol PRN, oxy 5 mg Q4H PRN.  4. Mood/Behavior/Sleep: Trazodone 25-50 mg PRN             -antipsychotic agents: none 5. Neuropsych/cognition: This patient is capable of making decisions on his own behalf. 6. Skin/Wound Care: Standard skin assessments, wound care.  - Would encourage removal of condom catheter to avoid skin  - breakdown - removed 2/6; incontinent, PVRs QID and times toiletting Q4H  7. Fluids/Electrolytes/Nutrition: Dysphagia. Heart healthy diet, thins.   - Admission labs stable, mild decrease in NA - repeat wednesday  - 2/5 encourage fluids  - 2/6: Full supervision with D2 thins per SLP d/t choking on food, impulsive eating. Meds crushed. Per SLP was modifying diet at home PTA.    - 2/7: Downgraded to D1; barium swallow pending. Encourage PO intakes  today, repeat labs 2/9.   8. L internal Carotid Artery stenosis s/p TCAR 08/12/22, continue ASA/Plavix/Crestor             -f/up in 4 weeks with carotid duplex with Dr. Donzetta Matters  9. Seizures: continue Keppra 500mg  BID 10. HTN: continue Norvasc 5mg  QD, held home Metoprolol 50mg  BID during hospitalization but restarted at 12.5mg  BID -Per neurology, allowed for permissive HTN (ok if <220/120) but gradually normalize by 08/14/22, long term normotensive goal    08/18/2022    3:10  PM 08/18/2022    5:48 AM 08/17/2022    7:59 PM  Vitals with BMI  Systolic 801 655 374  Diastolic 66 65 69  Pulse 79 71 79   - normotensive; continue current regimen   11. Acute on Chronic CKD stage 3a: baseline Cr ~1.2, peaked at 2.1, renal U/S 08/06/22 showing chronic medical renal disease but no hydronephrosis             -Avoid nephrotoxic agents, monitoring UOP   - 2/5 labs: Cr 1.6; stable, BUN sl up---push fluids - Cr improved 1.5; BUN continues uptrend - Encourage PO today and repeat Friday  12. Chronic normocytic anemia: baseline hgb 9-10, monitor CBC             -Hgb down to 8.2 after TCAR, stable at 8.0 on 08/13/22; 8.3 2/4; 9.3 2/7 13. Abnormal TSH: TSH 4.9 on admission 08/05/22, free T4 0.88; WNL.    - F/u with PCP 14. Hyponatremia: improved during hospitalization with hydration, monitor BMP   - Decreased 134 -> 132 2/4 -> 138 2/7; WNL    LOS: 4 days A FACE TO [place] 08/18/2022, 3:45 PM

## 2022-08-18 NOTE — Progress Notes (Signed)
Met with patient. Oriented to rehab. Informed of team conference every Tuesday. Discussed educational binder. Reports that has not smoked in 4 or 5 years. Reports some constipation. Educated on some of the foods that can help reduce constipation but daughter would have to bring in and would have to be the correct consistency. Discussed plavix being added to ASA. Discussed increase risk factors for another stroke. HLD controled. Reports that checking blood sugar at home 2x aday. A1C 5.3, monitor incision site for changes. Also provided grief/loss information if was interested in speaking to someone after discharge. All questions answered and all needs met.

## 2022-08-18 NOTE — Progress Notes (Signed)
Occupational Therapy Session Note  Patient Details  Name: Carlos Shields MRN: [id number] Date of Birth: 11-Dec-1930  Today's Date: 08/18/2022 OT Individual Time: 1300-1355 OT Individual Time Calculation (min): 55 min    Short Term Goals: Week 1:  OT Short Term Goal 1 (Week 1): Pt will perform toilet transfers with LRAD with CGA OT Short Term Goal 2 (Week 1): Pt will perform 3/3 toileting tasks with no more than CGA OT Short Term Goal 3 (Week 1): Pt will improve RUE MMT to 3/5 OT Short Term Goal 4 (Week 1): Pt will perform UB/LB dress with CGA  Skilled Therapeutic Interventions/Progress Updates:    Pt resting in w/c upon arrival. Pt declined bathing/dressing, stating he had already "cleaned up." Unable to locate any clean clothing in room. OT intervention with focus on standing balance, RUE FMC/GMC, and following simple commands to increase independence with BADLs. Sit<>stand from w/c with mod A. Standing balance with min A. Moderate posterior lean noted. Pt challenged to correct requiring assistance. Table tasks with velcro checker board using RUE. Slight dysmetria noted. Pt presented with colored peg board and simple alternating color pattern. Pt required max verbal cues to correct incorrect placement. Min verbal cues to continue with alternating pattern. Pt placed colored clothes pins on dowel. Pinch weakness noted in addition to slight dysmetria when placing on dowel. Pt returned to room and remained in w/c. Belt alarm activated. All needs within reach.   Therapy Documentation Precautions:  Precautions Precautions: Fall, Other (comment) Precaution Comments: incontinent, R hemi, seizure Restrictions Weight Bearing Restrictions: No   Pain:  Pt denies pain this afternoon     Therapy/Group: Individual Therapy  Leroy Libman 08/18/2022, 2:36 PM

## 2022-08-19 ENCOUNTER — Inpatient Hospital Stay (HOSPITAL_COMMUNITY): Payer: Medicare Other

## 2022-08-19 NOTE — Progress Notes (Signed)
Physical Therapy Session Note  Patient Details  Name: Carlos Shields MRN: [id number] Date of Birth: May 23, 1931  Today's Date: 08/19/2022 PT Individual Time: 1015-1130; 1330-1400 PT Individual Time Calculation (min): 75 min; 30 min  Short Term Goals: Week 1:  PT Short Term Goal 1 (Week 1): Pt will require CGA for dynamic standing balance with LRAD PT Short Term Goal 2 (Week 1): Pt will require CGA with gait x 50 ft with LRAD PT Short Term Goal 3 (Week 1): Pt will require min A for stair navigation x 4 with 2 HR's  Skilled Therapeutic Interventions/Progress Updates:  1st Treatment Session- Patient greeted sitting upright in wheelchair finishing eating breakfast with NT present and agreeable to PT treatment session. Patient wheeled to rehab gym for time management and energy conservation.   Patient stood from wheelchair with RW and CGA/MinA for anterior weight shift- VC for scooting forward in wheelchair, tucking feet underneath him, anterior weight shift and proper hand placement with inconsistent carryover noted.   Patient gait trained x180' with RW and CGA for safety- VC for stepping within the frame of the RW, increased B step length and increased BOS with minimal improvements noted and unable to sustain.   Attempted to have patient perform alternating toe taps to 4" step with R HHA, however patient demonstrated significant posterior lean requiring Min/ModA for stability. With VC/TC patient was able to bring his hips forward, however unable to sustain with poor hip extension and increased fear noted.   Patient stood with B UE support on hemi rail and tasked with performing an anterior pelvic tilt toward the wall- Patient held each tilt ~30 seconds while pushing B hips into the hemi rail. Performed x15 total with therapist providing overpressure for improved hip extension.   Patient ambulated ~15' from wheelchair to mat table with R HHA and MinA for stability. Patient transitioned from sitting  edge of mat table to supine with SBA. While supine, therapist performed sustained B hip adductor stretch and hip flexor stretch- Therapist spent >10 minutes on sustained stretching secondary to significant B LE tightness. Patient transitioned from supine to sitting EOB with MinA for trunk as patient is notably rigid and required facilitation for increased anterior weight shifting. Patient then ambulated ~15' back to his wheelchair with RW and CGA- Patient demonstrated improved BOS and step length, as well as hip extension post stretching.   Patient returned to his room sitting upright in wheelchair with call bell within reach, tray table in front, posey belt on and all needs met.    2nd Treatment Session- Patient greeted sitting upright in wheelchair in room and agreeable to PT treatment session. Patient denied needing to use the restroom prior to start of session. Patient stood from wheelchair with RW and MinA for anterior weight shift and patient has posterior bias- VC for proper sequencing, hand placement and anterior weight shift. Patient gait trained to/from his room and day room with RW and CGA/SBA for safety- VC for increased B step length, increased BOS, stepping within the frame of RW and improved postural extension. Patient tasked with stepping laterally and forward over bar on floor with B/S HHA- Patient performed x5 in each direction with increased difficulty taking large steps. Facilitation provided for improved weight shifting and postural extension. Patient returned to his room and sat on the toilet as patient was noted to be incontinent of urine. While sitting therapist doffed soiled brief and donned clean brief and pants. Patient stood from toilet with MinA and ambulated  to the bed with RW and CGA. Patient transitioned from sitting EOB to supine with SBA and MinA for repositioning for improved posturing. Patient left supine in bed with call bell within reach, bed alarm on and all needs met.     Therapy Documentation Precautions:  Precautions Precautions: Fall, Other (comment) Precaution Comments: incontinent, R hemi, seizure Restrictions Weight Bearing Restrictions: No   Therapy/Group: Individual Therapy  Jaidee Stipe 08/19/2022, 7:57 AM

## 2022-08-19 NOTE — Progress Notes (Signed)
PROGRESS NOTE   Subjective/Complaints:  No acute complaints. No events overnight.   LBM 2/8.   ROS: Denies fevers, chills, N/V, abdominal pain, constipation, diarrhea, SOB, cough, chest pain, new weakness or paraesthesias.    Objective:   No results found. Recent Labs    08/18/22 0721  WBC 7.0  HGB 9.3*  HCT 28.1*  PLT 231    Recent Labs    08/18/22 0721  NA 138  K 4.5  CL 104  CO2 22  GLUCOSE 101*  BUN 37*  CREATININE 1.52*  CALCIUM 9.1     Intake/Output Summary (Last 24 hours) at 08/19/2022 1427 Last data filed at 08/19/2022 1235 Gross per 24 hour  Intake 965 ml  Output 400 ml  Net 565 ml         Physical Exam: Vital Signs Blood pressure 133/62, pulse 74, temperature 98 F (36.7 C), resp. rate 20, height 5\' 4"  (1.626 m), weight 53.3 kg, SpO2 99 %.  Constitutional: No distress . Vital signs reviewed. HEENT: NCAT, EOMI, oral membranes moist Neck: supple Cardiovascular: RRR without murmur. No JVD    Respiratory/Chest: CTA Bilaterally without wheezes or rales. Normal effort    GI/Abdomen: BS +, non-tender, non-distended Ext: no clubbing, cyanosis, or edema Psych: pleasant and cooperative  GU: No further bag at bedside Skin: TCAR site L neck c.d.I, dermabonded MSK:      No apparent deformity.      Strength: Antigravity and against resistance in all 4 extremities. Standing with RW at sink with SPV.    Neurologic exam:  Cognition: AAO to person, place; not time.  + significant memory deficits, + attention deficits Coordination: + RUE ataxia; ongoing 2/8    Assessment/Plan: 1. Functional deficits which require 3+ hours per day of interdisciplinary therapy in a comprehensive inpatient rehab setting. Physiatrist is providing close team supervision and 24 hour management of active medical problems listed below. Physiatrist and rehab team continue to assess barriers to discharge/monitor patient  progress toward functional and medical goals  Care Tool:  Bathing    Body parts bathed by patient: Right arm, Left arm, Chest, Abdomen, Right upper leg, Left upper leg, Face, Front perineal area   Body parts bathed by helper: Buttocks, Right lower leg, Left lower leg     Bathing assist Assist Level: Moderate Assistance - Patient 50 - 74%     Upper Body Dressing/Undressing Upper body dressing   What is the patient wearing?: Pull over shirt    Upper body assist Assist Level: Minimal Assistance - Patient > 75%    Lower Body Dressing/Undressing Lower body dressing      What is the patient wearing?: Pants     Lower body assist Assist for lower body dressing: Moderate Assistance - Patient 50 - 74%     Toileting Toileting    Toileting assist Assist for toileting: Moderate Assistance - Patient 50 - 74%     Transfers Chair/bed transfer  Transfers assist     Chair/bed transfer assist level: Minimal Assistance - Patient > 75%     Locomotion Ambulation   Ambulation assist      Assist level: Minimal Assistance - Patient > 75%  Assistive device: Walker-rolling Max distance: 150 ft   Walk 10 feet activity   Assist     Assist level: Minimal Assistance - Patient > 75% Assistive device: Walker-rolling   Walk 50 feet activity   Assist    Assist level: Minimal Assistance - Patient > 75% Assistive device: Walker-rolling    Walk 150 feet activity   Assist    Assist level: Minimal Assistance - Patient > 75% Assistive device: Walker-rolling    Walk 10 feet on uneven surface  activity   Assist     Assist level: Moderate Assistance - Patient - 50 - 74% Assistive device: Aeronautical engineer Is the patient using a wheelchair?: Yes Type of Wheelchair: Manual    Wheelchair assist level: Maximal Assistance - Patient 25 - 49% Max wheelchair distance: 5 ft    Wheelchair 50 feet with 2 turns activity    Assist         Assist Level: Total Assistance - Patient < 25%   Wheelchair 150 feet activity     Assist      Assist Level: Total Assistance - Patient < 25%   Blood pressure 133/62, pulse 74, temperature 98 F (36.7 C), resp. rate 20, height 5\' 4"  (1.626 m), weight 53.3 kg, SpO2 99 %.  Medical Problem List and Plan: 1. Functional deficits secondary to multiple small acute ischemic CVA infarcts involving the left basal ganglia, left frontal, right occipital cortex and adjacent white matter              -patient may shower             -ELOS/Goals: 10-12 days, supervision/CGA PT, supervision OT, supervision SLP; DC 2/16  -Continue CIR therapies including PT, OT, SLP  - Notable significant cognitive deficits, OP documentation of ?Parkinsonism d/t shuffling gait, tremor, and memory deficits; was not worked up. Will discuss with neurology if memantine vs. Sinemet trial appropriate to start inpatient or defer to OP.  2.  Antithrombotics: -DVT/anticoagulation: Lovenox 30 mg daily  -antiplatelet therapy: DAPT ASA 81mg  QD + Plavix 75mg  QD x3 weeks then Plavix alone (~08/29/22) 3. Pain Management: tylenol PRN, oxy 5 mg Q4H PRN.  4. Mood/Behavior/Sleep: Trazodone 25-50 mg PRN             -antipsychotic agents: none 5. Neuropsych/cognition: This patient is capable of making decisions on his own behalf. 6. Skin/Wound Care: Standard skin assessments, wound care.  - Would encourage removal of condom catheter to avoid skin  - removed 2/6; incontinent, PVRs QID and times toiletting Q4H  7. Fluids/Electrolytes/Nutrition: Dysphagia. Heart healthy diet, thins.    - Note OP workup for oropharyngeal dysphagia 06/2022 - Admission labs stable, mild decrease in NA - repeat wednesday  - 2/5 encourage fluids  - 2/6: Full supervision with D2 thins per SLP d/t choking on food, impulsive eating. Meds crushed. Per SLP was modifying diet at home PTA.    - 2/7: Downgraded to D1; barium swallow pending. Encourage PO intakes  today, repeat labs 2/9.     8. L internal Carotid Artery stenosis s/p TCAR 08/12/22, continue ASA/Plavix/Crestor             -f/up in 4 weeks with carotid duplex with Dr. Donzetta Matters  9. Seizures: continue Keppra 500mg  BID  10. HTN: continue Norvasc 5mg  QD, held home Metoprolol 50mg  BID during hospitalization but restarted at 12.5mg  BID -Per neurology, allowed for permissive HTN (ok if <220/120) but gradually normalize by 08/14/22,  long term normotensive goal    08/19/2022   12:51 PM 08/19/2022    4:40 AM 08/18/2022    8:07 PM  Vitals with BMI  Systolic 063 016 010  Diastolic 62 66 72  Pulse 74 73 95   - normotensive; continue current regimen   11. Acute on Chronic CKD stage 3a: baseline Cr ~1.2, peaked at 2.1, renal U/S 08/06/22 showing chronic medical renal disease but no hydronephrosis             -Avoid nephrotoxic agents, monitoring UOP   - 2/5 labs: Cr 1.6; stable, BUN sl up---push fluids - Cr improved 1.5; BUN continues uptrend - Encourage PO today and repeat Friday  12. Chronic normocytic anemia: baseline hgb 9-10, monitor CBC             -Hgb down to 8.2 after TCAR, stable at 8.0 on 08/13/22; 8.3 2/4; 9.3 2/7 13. Abnormal TSH: TSH 4.9 on admission 08/05/22, free T4 0.88; WNL.    - F/u with PCP 14. Hyponatremia: improved during hospitalization with hydration, monitor BMP   - Decreased 134 -> 132 2/4 -> 138 2/7; WNL    LOS: 5 days A FACE TO FACE EVALUATION WAS PERFORMED  Gertie Gowda 08/19/2022, 2:27 PM

## 2022-08-19 NOTE — Progress Notes (Signed)
Occupational Therapy Session Note  Patient Details  Name: Carlos Shields MRN: [id number] Date of Birth: 06/20/1931  Today's Date: 08/19/2022 OT Individual Time: 5621-3086 OT Individual Time Calculation (min): 40 min  and Today's Date: 08/19/2022 OT Missed Time: 20 Minutes Missed Time Reason: Other (comment) (scheduling conflict)   Short Term Goals: Week 1:  OT Short Term Goal 1 (Week 1): Pt will perform toilet transfers with LRAD with CGA OT Short Term Goal 2 (Week 1): Pt will perform 3/3 toileting tasks with no more than CGA OT Short Term Goal 3 (Week 1): Pt will improve RUE MMT to 3/5 OT Short Term Goal 4 (Week 1): Pt will perform UB/LB dress with CGA  Skilled Therapeutic Interventions/Progress Updates:    Pt greeted semi-reclined in bed, easy to wake and agreeable to OT treatment session. Pt needed increased time and min A to get to EOB. Pt needed mod A to stand with strong posterior lean. Posterior bias decreased with forward mobility. Pt needed increased time and cues for RW position over the commode to stand and void. He then ambulated to the shower with RW and min A. Pt bathed with overall min A to was lower legs and increased time for all tasks. Dressing tasks from wc at the sink with education provided for adapted strategies and anterior weight shift when coming into standing. Pt reported urgent need for a BM and OT transferred pt to toilet with min A, then handed off patient to nurse tech.   Therapy Documentation Precautions:  Precautions Precautions: Fall, Other (comment) Precaution Comments: incontinent, R hemi, seizure Restrictions Weight Bearing Restrictions: No General: General OT Amount of Missed Time: 20 Minutes Pain: Pain Assessment Pain Scale: 0-10 Pain Score: 0-No pain    Therapy/Group: Individual Therapy  Valma Cava 08/19/2022, 10:41 AM

## 2022-08-19 NOTE — Procedures (Signed)
Modified Barium Swallow Study  Patient Details  Name: Carlos Shields MRN: [id number] Date of Birth: Oct 16, 1930  Today's Date: 08/19/2022  Modified Barium Swallow completed.  Full report located under Chart Review in the Imaging Section.  History of Present Illness Patient is an 89 y.o. male with a PMHx of seizure, CVA, hypertension, CKD stage III, pancreatitis, venous insufficiency, and dysphagia who was admitted on 08/05/22 for right-sided weakness, incontinence, seizure like activity, and fall. MRI brain 08/05/22 which revealed multiple small acute infarcts involving the L basal ganglia, L frontal, parietal, and occipital cortex, and adjacent white matter with slight edema but no mass effect. He was admitted and underwent MRA head on 08/06/22 which showed intracranial atherosclerotic disease with associated moderate stenosis at the L carotid siphon with additional severe distal L P3 stenosis. Vascular surgery was consulted, he underwent CTA neck on 08/08/22 which showed proximal L ICA stenosis 75% and 65% on right, and he subsequently underwent L transcarotid artery revascularization on 08/12/22 with Dr. Donzetta Matters. He remained stable during his hospitalization, with improving kidney function back to baseline. CIR was recommended due to functional decline and patient admitted 08/14/22. Patient with previous OP MBS on 07/22/22 due to reports of coughing with solid textures. Patient with mild impairments and recommended to continue regular textures with thin liquids. Patient with s/s of aspiration at bedside and downgraded to Dys. 2 textures with continuation of thin liquids. MBS today to assess swallow function.    Clinical Impression Patient presents with a mild oropharyngeal dysphagia c/b mild deficits in bolus prep/mastication and bolus transport with swallows initiated consistently at the valleculae. Patient's pharyngeal phase is c/b reduced anterior hyoid excursion, decreased tongue base retraction and diminished  stripping wave resulting in mild residue.  No penetration or aspiration observed with exception of 1 event of flash penetration with thin liquids via straw (PAS 2).  Although patient's swallow appeared grossly functional for consistencies tested, will f/u for potential impact on more solid consistencies given clinician presentation at the bedside. Recommend patient continue current diet of Dys. 2 textures with thin liquids with full supervision to maximize safety with overall PO intake.  Factors that may increase risk of adverse event in presence of aspiration ([place] 2021): Reduced cognitive function;Frail or deconditioned  Swallow Evaluation Recommendations Recommendations: PO diet PO Diet Recommendation: Dysphagia 2 (Finely chopped);Thin liquids (Level 0) Liquid Administration via: Cup Medication Administration: Crushed with puree Supervision: Patient able to self-feed;Staff to assist with self-feeding;Full supervision/cueing for swallowing strategies Swallowing strategies  : Minimize environmental distractions;Slow rate;Small bites/sips;Follow solids with liquids Postural changes: Stay upright 30-60 min after meals;Position pt fully upright for meals Oral care recommendations: Oral care BID (2x/day)      Adora Yeh 08/19/2022,4:45 PM

## 2022-08-20 LAB — BASIC METABOLIC PANEL
Anion gap: 12 (ref 5–15)
BUN: 44 mg/dL — ABNORMAL HIGH (ref 8–23)
CO2: 22 mmol/L (ref 22–32)
Calcium: 9.1 mg/dL (ref 8.9–10.3)
Chloride: 103 mmol/L (ref 98–111)
Creatinine, Ser: 1.69 mg/dL — ABNORMAL HIGH (ref 0.61–1.24)
GFR, Estimated: 38 mL/min — ABNORMAL LOW (ref >60)
Glucose, Bld: 100 mg/dL — ABNORMAL HIGH (ref 70–99)
Potassium: 4.3 mmol/L (ref 3.5–5.1)
Sodium: 137 mmol/L (ref 135–145)

## 2022-08-20 MED ORDER — MEMANTINE HCL 5 MG PO TABS
5.0000 mg | ORAL_TABLET | Freq: Every day | ORAL | Status: AC
  Administered 2022-08-20 – 2022-08-26 (×7): 5 mg via ORAL
  Filled 2022-08-20 (×7): qty 1

## 2022-08-20 MED ORDER — MEMANTINE HCL 5 MG PO TABS
5.0000 mg | ORAL_TABLET | Freq: Two times a day (BID) | ORAL | Status: DC
  Administered 2022-08-27: 5 mg via ORAL
  Filled 2022-08-20: qty 1

## 2022-08-20 NOTE — Progress Notes (Addendum)
PROGRESS NOTE   Subjective/Complaints:  No acute complaints. No events overnight.   ROS: Denies fevers, chills, N/V, abdominal pain, constipation, diarrhea, SOB, cough, chest pain, new weakness or paraesthesias.    Objective:   DG Swallowing Func-Speech Pathology  Result Date: 08/19/2022 Table formatting from the original result was not included. Modified Barium Swallow Study Patient Details Name: Carlos Shields MRN: [id number] Date of Birth: May 29, 1931 Today's Date: 08/19/2022 HPI/PMH: HPI: Patient is an 89 y.o. male with a PMHx of seizure, CVA, hypertension, CKD stage III, pancreatitis, venous insufficiency, and dysphagia who was admitted on 08/05/22 for right-sided weakness, incontinence, seizure like activity, and fall. MRI brain 08/05/22 which revealed multiple small acute infarcts involving the L basal ganglia, L frontal, parietal, and occipital cortex, and adjacent white matter with slight edema but no mass effect. He was admitted and underwent MRA head on 08/06/22 which showed intracranial atherosclerotic disease with associated moderate stenosis at the L carotid siphon with additional severe distal L P3 stenosis. Vascular surgery was consulted, he underwent CTA neck on 08/08/22 which showed proximal L ICA stenosis 75% and 65% on right, and he subsequently underwent L transcarotid artery revascularization on 08/12/22 with Dr. Donzetta Matters. He remained stable during his hospitalization, with improving kidney function back to baseline. CIR was recommended due to functional decline and patient admitted 08/14/22. Patient with previous OP MBS on 07/22/22 due to reports of coughing with solid textures. Patient with mild impairments and recommended to continue regular textures with thin liquids. Patient with s/s of aspiration at bedside and downgraded to Dys. 2 textures with continuation of thin liquids. MBS today to assess swallow function. Clinical Impression: Clinical  Impression: Patient presents with a mild oropharyngeal dysphagia c/b mild deficits in bolus prep/mastication and bolus transport with swallows initiated consistently at the valleculae. Patient's pharyngeal phase is c/b reduced anterior hyoid excursion, decreased tongue base retraction and diminished stripping wave resulting in mild residue.  No penetration or aspiration observed with exception of 1 event of flash penetration with thin liquids via straw (PAS 2).  Although patient's swallow appeared grossly functional for consistencies tested, will f/u for potential impact on more solid consistencies given clinician presentation at the bedside. Recommend patient continue current diet of Dys. 2 textures with thin liquids with full supervision to maximize safety with overall PO intake. Factors that may increase risk of adverse event in presence of aspiration ([place] 2021): Factors that may increase risk of adverse event in presence of aspiration ([place] 2021): Reduced cognitive function; Frail or deconditioned Recommendations/Plan: Swallowing Evaluation Recommendations Swallowing Evaluation Recommendations Recommendations: PO diet PO Diet Recommendation: Dysphagia 2 (Finely chopped); Thin liquids (Level 0) Liquid Administration via: Cup Medication Administration: Crushed with puree Supervision: Patient able to self-feed; Staff to assist with self-feeding; Full supervision/cueing for swallowing strategies Swallowing strategies  : Minimize environmental distractions; Slow rate; Small bites/sips; Follow solids with liquids Postural changes: Stay upright 30-60 min after meals; Position pt fully upright for meals Oral care recommendations: Oral care BID (2x/day) Treatment Plan Treatment Plan Treatment recommendations: Therapy as outlined in treatment plan below Treatment frequency: Min 3x/week Treatment duration: 2 weeks Interventions: Aspiration precaution training; Compensatory techniques;  Patient/family  education; Trials of upgraded texture/liquids; Diet toleration management by SLP Recommendations Recommendations for follow up therapy are one component of a multi-disciplinary discharge planning process, led by the attending physician.  Recommendations may be updated based on patient status, additional functional criteria and insurance authorization. Assessment: Orofacial Exam: Orofacial Exam Oral Cavity: Oral Hygiene: WFL Oral Cavity - Dentition: Poor condition; Missing dentition Orofacial Anatomy: WFL Oral Motor/Sensory Function: Suspected cranial nerve impairment CN V - Trigeminal: WFL CN VII - Facial: WFL CN IX - Glossopharyngeal, CN X - Vagus: WFL CN XII - Hypoglossal: Right motor impairment Anatomy: Anatomy: WFL Thin Liquids: Thin Liquids (Level 0) Thin Liquids : Impaired Bolus delivery method: Spoon; Cup; Straw Thin Liquid - Impairment: Oral Impairment; Pharyngeal impairment Tongue control during bolus hold: Posterior escape of greater than half of bolus Bolus transport/lingual motion: Brisk tongue motion Oral residue: Trace residue lining oral structures Location of oral residue : Tongue Initiation of swallow : Valleculae Soft palate elevation: Complete Laryngeal elevation: Complete superior movement of thyroid cartilage with complete approximation of arytenoids to epiglottic petiole Anterior hyoid excursion: Partial Epiglottic movement: Complete Laryngeal vestibule closure: Complete: No air/contrast in laryngeal vestibule Pharyngeal stripping wave : Present - complete Pharyngeal contraction (A/P view only): N/A Pharyngoesophageal segment opening: Complete distension and complete duration, no obstruction of flow Tongue base retraction: Narrow column of contrast or air between tongue base and PPW Pharyngeal residue: Collection of residue within or on pharyngeal structures Location of pharyngeal residue: Pyriform sinuses; Valleculae Penetration/Aspiration Scale (PAS) score: 1.  Material does  not enter airway  Mildly Thick Liquids: Mildly thick liquids (Level 2, nectar thick) Mildly thick liquids (Level 2, nectar thick): Impaired Bolus delivery method: Spoon; Cup Mildly Thick Liquid - Impairment: Oral Impairment; Pharyngeal impairment Lip Closure: Escape from interlabial space or lateral juncture, no extension beyond vermillion border Tongue control during bolus hold: Posterior escape of greater than half of bolus Bolus transport/lingual motion: Brisk tongue motion Oral residue: Residue collection on oral structures Location of oral residue : Tongue; Palate Initiation of swallow : Posterior laryngeal surface of the epiglottis Soft palate elevation: Complete Laryngeal elevation: Complete superior movement of thyroid cartilage with complete approximation of arytenoids to epiglottic petiole Anterior hyoid excursion: Partial Epiglottic movement: Complete Laryngeal vestibule closure: Complete: No air/contrast in laryngeal vestibule Pharyngeal stripping wave : Present - diminished Pharyngeal contraction (A/P view only): N/A Pharyngoesophageal segment opening: Complete distension and complete duration, no obstruction of flow Tongue base retraction: Wide column of contrast or air between tongue base and PPW Pharyngeal residue: Collection of residue within or on pharyngeal structures Location of pharyngeal residue: Valleculae; Pharyngeal wall; Aryepiglottic folds Penetration/Aspiration Scale (PAS) score: 1.  Material does not enter airway  Moderately Thick Liquids: Moderately thick liquids (Level 3, honey thick) Moderately thick liquids (Level 3, honey thick): Impaired Bolus delivery method: Spoon Moderately Thick Liquid - Impairment: Oral Impairment; Pharyngeal impairment Lip Closure: No labial escape Tongue control during bolus hold: Posterior escape of greater than half of bolus Bolus transport/lingual motion: Brisk tongue motion Oral residue: Residue collection on oral structures Location of oral residue :  Tongue; Palate Initiation of swallow : Valleculae Soft palate elevation: Complete Laryngeal elevation: Complete superior movement of thyroid cartilage with complete approximation of arytenoids to epiglottic petiole Anterior hyoid excursion: Partial Epiglottic movement: Complete Laryngeal vestibule closure: Complete: No air/contrast in laryngeal vestibule Pharyngeal stripping wave : Present - diminished Pharyngeal contraction (A/P view only): N/A Pharyngoesophageal segment opening: Complete distension and complete duration, no obstruction of flow Tongue  base retraction: Wide column of contrast or air between tongue base and PPW Pharyngeal residue: Collection of residue within or on pharyngeal structures Location of pharyngeal residue: Valleculae; Tongue base Penetration/Aspiration Scale (PAS) score: 1.  Material does not enter airway  Puree: Puree Puree: Impaired Puree - Impairment: Oral Impairment; Pharyngeal impairment Lip Closure: No labial escape Bolus transport/lingual motion: Slow tongue motion Oral residue: Trace residue lining oral structures Location of oral residue : Tongue Initiation of swallow: Valleculae Soft palate elevation: Complete Laryngeal elevation: Complete superior movement of thyroid cartilage with complete approximation of arytenoids to epiglottic petiole Anterior hyoid excursion: Partial Epiglottic movement: Complete Laryngeal vestibule closure: Complete: No air/contrast in laryngeal vestibule Pharyngeal stripping wave : Present - diminished Pharyngeal contraction (A/P view only): N/A Pharyngoesophageal segment opening: Complete distension and complete duration, no obstruction of flow Tongue base retraction: Wide column of contrast or air between tongue base and PPW Pharyngeal residue: Collection of residue within or on pharyngeal structures Location of pharyngeal residue: Valleculae; Pharyngeal wall Penetration/Aspiration Scale (PAS) score: 1.  Material does not enter airway Solid: Solid  Solid: Impaired Solid - Impairment: Oral Impairment; Pharyngeal impairment Lip Closure: No labial escape Bolus preparation/mastication: Disorganized chewing/mashing with solid pieces of bolus unchewed Bolus transport/lingual motion: Repetitive/disorganized tongue motion Oral residue: Residue collection on oral structures Location of oral residue : Tongue Initiation of swallow: Valleculae Soft palate elevation: Complete Laryngeal elevation: Complete superior movement of thyroid cartilage with complete approximation of arytenoids to epiglottic petiole Anterior hyoid excursion: Partial Epiglottic movement: Complete Laryngeal vestibule closure: Complete: No air/contrast in laryngeal vestibule Pharyngeal stripping wave : Present - diminished Pharyngeal contraction (A/P view only): N/A Pharyngoesophageal segment opening: Complete distension and complete duration, no obstruction of flow Tongue base retraction: Narrow column of contrast or air between tongue base and PPW Pharyngeal residue: Collection of residue within or on pharyngeal structures Location of pharyngeal residue: Valleculae Penetration/Aspiration Scale (PAS) score: 1.  Material does not enter airway Pill: Pill Pill: Not Tested Compensatory Strategies: Compensatory Strategies Compensatory strategies: Yes Straw: Ineffective Effective Straw: Thin liquid (Level 0)   General Information: Caregiver present: No  Diet Prior to this Study: Dysphagia 2 (finely chopped); Thin liquids (Level 0)   Temperature : Normal   Respiratory Status: WFL   Supplemental O2: None (Room air)   History of Recent Intubation: No  Behavior/Cognition: Alert; Cooperative; Pleasant mood Self-Feeding Abilities: Able to self-feed; Needs set-up for self-feeding Baseline vocal quality/speech: Dysphonic Volitional Cough: Able to elicit Volitional Swallow: Able to elicit Exam Limitations: No limitations Goal Planning: Prognosis for improved oropharyngeal function: Fair Barriers to Reach Goals:  Cognitive deficits No data recorded Patient/Family Stated Goal: pt reports problems swallowing food - sensing they stick in his throat - on the left Consulted and agree with results and recommendations: Patient Pain: Pain Assessment Pain Assessment: Faces Faces Pain Scale: 0 Pain Intervention(s): Monitored during session End of Session: Start Time:SLP Start Time (ACUTE ONLY): 1300 Stop Time: SLP Stop Time (ACUTE ONLY): 1316 Time Calculation:SLP Time Calculation (min) (ACUTE ONLY): 16 min Charges: SLP Evaluations $ SLP Speech Visit: 1 Visit SLP Evaluations $Outpatient MBS Swallow: 1 Procedure $ SLP EVAL LANGUAGE/SOUND PRODUCTION: 1 Procedure $Swallowing Treatment: 1 Procedure $Cognitive Funtion inital: Initial 15 mins SLP visit diagnosis: SLP Visit Diagnosis: Dysphagia, oropharyngeal phase (R13.12) Past Medical History: Past Medical History: Diagnosis Date  CKD (chronic kidney disease) stage 3, GFR 30-59 ml/min (HCC)   Gait abnormality   Hypertension   Pancreatitis   Seizures (HCC)   Venous insufficiency  Past Surgical History: Past Surgical History: Procedure Laterality Date  HERNIA REPAIR    TRANSCAROTID ARTERY REVASCULARIZATION  Left 08/12/2022  Procedure: Transcarotid Artery Revascularization;  Surgeon: Waynetta Sandy, MD;  Location: [facility];  Service: Vascular;  Laterality: Left; PAYNE, [place] 08/19/2022, 4:47 PM  Recent Labs    08/18/22 0721  WBC 7.0  HGB 9.3*  HCT 28.1*  PLT 231    Recent Labs    08/18/22 0721  NA 138  K 4.5  CL 104  CO2 22  GLUCOSE 101*  BUN 37*  CREATININE 1.52*  CALCIUM 9.1     Intake/Output Summary (Last 24 hours) at 08/20/2022 1141 Last data filed at 08/20/2022 0400 Gross per 24 hour  Intake 540 ml  Output 500 ml  Net 40 ml         Physical Exam: Vital Signs Blood pressure 108/61, pulse 68, temperature 98.2 F (36.8 C), temperature source Oral, resp. rate 16, height 5' 4"$  (1.626 m), weight 53.3 kg, SpO2 99 %.  Constitutional: No distress . Vital  signs reviewed. HEENT: NCAT, EOMI, oral membranes moist Neck: supple Cardiovascular: RRR without murmur. No JVD    Respiratory/Chest: CTA Bilaterally without wheezes or rales. Normal effort    GI/Abdomen: BS +, non-tender, non-distended Ext: no clubbing, cyanosis, or edema Psych: pleasant and cooperative  GU: No further bag at bedside Skin: TCAR site L neck c.d.I, dermabonded MSK:      No apparent deformity.      Strength: Antigravity and against resistance in all 4 extremities in [facility]   Neurologic exam:  Cognition: AAO to person, place; not time.  + significant memory deficits, + attention deficits, distractable (fixates on TV unless muted) Coordination: no ataxia with FTN bilaterally today, no drift, no tremor at rest, very mild bilateral UE intention tremor    Assessment/Plan: 1. Functional deficits which require 3+ hours per day of interdisciplinary therapy in a comprehensive inpatient rehab setting. Physiatrist is providing close team supervision and 24 hour management of active medical problems listed below. Physiatrist and rehab team continue to assess barriers to discharge/monitor patient progress toward functional and medical goals  Care Tool:  Bathing    Body parts bathed by patient: Right arm, Left arm, Chest, Abdomen, Right upper leg, Left upper leg, Face, Front perineal area   Body parts bathed by helper: Buttocks, Right lower leg, Left lower leg     Bathing assist Assist Level: Moderate Assistance - Patient 50 - 74%     Upper Body Dressing/Undressing Upper body dressing   What is the patient wearing?: Pull over shirt    Upper body assist Assist Level: Minimal Assistance - Patient > 75%    Lower Body Dressing/Undressing Lower body dressing      What is the patient wearing?: Pants     Lower body assist Assist for lower body dressing: Moderate Assistance - Patient 50 - 74%     Toileting Toileting    Toileting assist Assist for toileting: Moderate  Assistance - Patient 50 - 74%     Transfers Chair/bed transfer  Transfers assist     Chair/bed transfer assist level: Minimal Assistance - Patient > 75%     Locomotion Ambulation   Ambulation assist      Assist level: Minimal Assistance - Patient > 75% Assistive device: Walker-rolling Max distance: 150 ft   Walk 10 feet activity   Assist     Assist level: Minimal Assistance - Patient > 75% Assistive device: Walker-rolling   Walk  50 feet activity   Assist    Assist level: Minimal Assistance - Patient > 75% Assistive device: Walker-rolling    Walk 150 feet activity   Assist    Assist level: Minimal Assistance - Patient > 75% Assistive device: Walker-rolling    Walk 10 feet on uneven surface  activity   Assist     Assist level: Moderate Assistance - Patient - 50 - 74% Assistive device: Aeronautical engineer Is the patient using a wheelchair?: Yes Type of Wheelchair: Manual    Wheelchair assist level: Maximal Assistance - Patient 25 - 49% Max wheelchair distance: 5 ft    Wheelchair 50 feet with 2 turns activity    Assist        Assist Level: Total Assistance - Patient < 25%   Wheelchair 150 feet activity     Assist      Assist Level: Total Assistance - Patient < 25%   Blood pressure 108/61, pulse 68, temperature 98.2 F (36.8 C), temperature source Oral, resp. rate 16, height 5' 4"$  (1.626 m), weight 53.3 kg, SpO2 99 %.  Medical Problem List and Plan: 1. Functional deficits secondary to multiple small acute ischemic CVA infarcts involving the left basal ganglia, left frontal, right occipital cortex and adjacent white matter              -patient may shower             -ELOS/Goals: 10-12 days, supervision/CGA PT, supervision OT, supervision SLP; DC 2/16  -Continue CIR therapies including PT, OT, SLP  - Notable significant cognitive deficits, OP documentation of ?Parkinsonism d/t shuffling gait, tremor,  and memory deficits; was not worked up.   2/9: Exam not consistent with parkinsonism, suspect generalized dementia. SLUMS 4/26 on intake. Will start memantine 5 mg daily x7 days, then increase to 5 mg BID as tolerated.   2.  Antithrombotics: -DVT/anticoagulation: Lovenox 30 mg daily  -antiplatelet therapy: DAPT ASA 88m QD + Plavix 767mQD x3 weeks then Plavix alone (~08/29/22) 3. Pain Management: tylenol PRN, oxy 5 mg Q4H PRN.  4. Mood/Behavior/Sleep: Trazodone 25-50 mg PRN             -antipsychotic agents: none 5. Neuropsych/cognition: This patient is capable of making decisions on his own behalf. 6. Skin/Wound Care: Standard skin assessments, wound care.  - Would encourage removal of condom catheter to avoid skin  - removed 2/6; incontinent, PVRs QID and times toiletting Q4H   7. Fluids/Electrolytes/Nutrition: Dysphagia. Heart healthy diet, thins.    - Note OP workup for oropharyngeal dysphagia 06/2022 - Admission labs stable, mild decrease in NA - repeat wednesday  - 2/5 encourage fluids  - 2/6: Full supervision with D2 thins per SLP d/t choking on food, impulsive eating. Meds crushed. Per SLP was modifying diet at home PTA.    - 2/7: Downgraded to D1; barium swallow pending. Encourage PO intakes today, repeat labs 2/9.   - 2/9: Barium swallow: patient's swallow appeared grossly functional for consistencies tested, will f/u for potential impact on more solid consistencies given clinician presentation at the bedside. Recommend patient continue current diet of Dys. 2 textures with thin liquids with full supervision to maximize safety with overall PO intake.     8. L internal Carotid Artery stenosis s/p TCAR 08/12/22, continue ASA/Plavix/Crestor             -f/up in 4 weeks with carotid duplex with Dr. CaDonzetta Matters9. Seizures: continue Keppra  549m BID  10. HTN: continue Norvasc 540mQD, held home Metoprolol 5041mID during hospitalization but restarted at 12.5mg71mD -Per neurology, allowed for  permissive HTN (ok if <220/120) but gradually normalize by 08/14/22, long term normotensive goal    08/20/2022    4:00 AM 08/19/2022    8:00 PM 08/19/2022   12:51 PM  Vitals with BMI  Systolic 108 [id number] 61 65 62  Pulse 68 87 74   - normotensive; continue current regimen   11. Acute on Chronic CKD stage 3a: baseline Cr ~1.2, peaked at 2.1, renal U/S 08/06/22 showing chronic medical renal disease but no hydronephrosis             -Avoid nephrotoxic agents, monitoring UOP   - 2/5 labs: Cr 1.6; stable, BUN sl up---push fluids - Cr improved 1.5; BUN continues uptrend - Encourage PO today and repeat Friday - pending  12. Chronic normocytic anemia: baseline hgb 9-10, monitor CBC             -Hgb down to 8.2 after TCAR, stable at 8.0 on 08/13/22; 8.3 2/4; 9.3 2/7 13. Abnormal TSH: TSH 4.9 on admission 08/05/22, free T4 0.88; WNL.    - F/u with PCP 14. Hyponatremia: improved during hospitalization with hydration, monitor BMP   - Decreased 134 -> 132 2/4 -> 138 2/7; WNL    LOS: 6 days A FACE TO FACE EVALUATION WAS PERFORMED  MorgGertie Gowda/2024, 11:41 AM

## 2022-08-20 NOTE — Progress Notes (Signed)
Occupational Therapy Session Note  Patient Details  Name: Carlos Shields MRN: [id number] Date of Birth: 05-07-1931  Today's Date: 08/20/2022 OT Individual Time: [id number] OT Individual Time Calculation (min): 30 min    Short Term Goals: Week 1:  OT Short Term Goal 1 (Week 1): Pt will perform toilet transfers with LRAD with CGA OT Short Term Goal 2 (Week 1): Pt will perform 3/3 toileting tasks with no more than CGA OT Short Term Goal 3 (Week 1): Pt will improve RUE MMT to 3/5 OT Short Term Goal 4 (Week 1): Pt will perform UB/LB dress with CGA  Skilled Therapeutic Interventions/Progress Updates:    Pt greeted seated in wc and agreeable to OT treatment session. Functional ambulation w/ RW and min A. But mod A to get to standing with cues for anterior weight shift. UB and LB strength/endurance with NuStep on level 3 for 15 minutes with 1 rest break. Pt returned to room and left seated in wc with alarm belt on, call bell in reach and needs met.   Therapy Documentation Precautions:  Precautions Precautions: Fall, Other (comment) Precaution Comments: incontinent, R hemi, seizure Restrictions Weight Bearing Restrictions: No  Pain: Pain Assessment Pain Scale: 0-10 Pain Score: 0-No pain    Therapy/Group: Individual Therapy  Valma Cava 08/20/2022, 3:02 PM

## 2022-08-20 NOTE — Progress Notes (Signed)
Physical Therapy Session Note  Patient Details  Name: Carlos Shields MRN: [id number] Date of Birth: 02-19-1931  Today's Date: 08/20/2022 PT Individual Time: 1115-1200 PT Individual Time Calculation (min): 45 min   Short Term Goals: Week 1:  PT Short Term Goal 1 (Week 1): Pt will require CGA for dynamic standing balance with LRAD PT Short Term Goal 2 (Week 1): Pt will require CGA with gait x 50 ft with LRAD PT Short Term Goal 3 (Week 1): Pt will require min A for stair navigation x 4 with 2 HR's  Skilled Therapeutic Interventions/Progress Updates:    Pt seated in w/c on arrival and agreeable to therapy. No complaint of pain. Pt transported to therapy gym for time management and energy conservation. Pt participated in alternating gait training with therapeutic activities. Pt ambulated 3 x 170 ft with RW and CGA-min A. Cues for upright posture, increased stride length, and RW proximity. Pt was able to correct with cueing, but increased difficulty with fatigue. Pt performed Sit to stand without RW for dynamic balance challenge and global strengthening. Required up to min A for some reps for adequate anterior weight shift. Pt also performed step taps on 4" steps, progressing to step ups for LE strength. Pt returned to room and remained seated in w/c, was left with all needs in reach and alarm active.   Therapy Documentation Precautions:  Precautions Precautions: Fall, Other (comment) Precaution Comments: incontinent, R hemi, seizure Restrictions Weight Bearing Restrictions: No General:      Therapy/Group: Individual Therapy  Mickel Fuchs 08/20/2022, 12:19 PM

## 2022-08-20 NOTE — Progress Notes (Signed)
Speech Language Pathology Daily Session Note  Patient Details  Name: Carlos Shields MRN: [id number] Date of Birth: 05/31/1931  Today's Date: 08/20/2022 SLP Individual Time: 0900-0957 SLP Individual Time Calculation (min): 57 min  Short Term Goals: Week 1: SLP Short Term Goal 1 (Week 1): Pt will utilize external aids to recall daily information with mod assist multimodal cues. SLP Short Term Goal 2 (Week 1): Pt will complete basic, famliar tasks with mod assist for functional problem solving. SLP Short Term Goal 3 (Week 1): Pt will recognize and correct errors when completing functional tasks with mod assist multimodal cues. SLP Short Term Goal 4 (Week 1): Patient will consume current diet with minimal overt s/s of aspiration and Min verbal cues for use of swallowing compensatory strategies.  Skilled Therapeutic Interventions: Skilled treatment session focused on dysphagia and cognitive-linguistic goals. SLP facilitated session by providing skilled observation with lunch meal of Dys. 1 textures with thin liquids via cup. Patient consumed meal without overt s/s of aspiration with Min verbal cues needed for use of swallowing compensatory strategies. SLP provided education regarding patient's current swallowing function and diet recommendations, however, patient requested to remain on Dys. 1 textures at this time. Therefore, recommend patient remain on current diet per his request. SLP also facilitated session by providing Mod A verbal and visual cues for problem solving during a 4-step picture sequencing task with extra time needed for word-finding. Patient left upright in wheelchair with alarm on and all needs within reach. Continue with current plan of care.      Pain No/Denies Pain   Therapy/Group: Individual Therapy  Davona Kinoshita 08/20/2022, 12:14 PM

## 2022-08-20 NOTE — Progress Notes (Signed)
Physical Therapy Session Note  Patient Details  Name: Carlos Shields MRN: [id number] Date of Birth: 04-16-1931  Today's Date: 08/20/2022 PT Individual Time: 0800-0900 PT Individual Time Calculation (min): 60 min   Short Term Goals: Week 1:  PT Short Term Goal 1 (Week 1): Pt will require CGA for dynamic standing balance with LRAD PT Short Term Goal 2 (Week 1): Pt will require CGA with gait x 50 ft with LRAD PT Short Term Goal 3 (Week 1): Pt will require min A for stair navigation x 4 with 2 HR's  Skilled Therapeutic Interventions/Progress Updates:  Patient greeted sitting upright in bed with RN and NT present while eating breakfast- Patient agreeable to PT treatment session. Patient transitioned from long-sitting to sitting EOB with increased time, use of bed rail and SBA. While sitting EOB, patient demonstrated posterior lean requiring VC for anterior weight shift for improved sitting balance. While sitting EOB, patient doffed shirt with ModA and donned new shirt with MinA- Patient attempted to don socks, however was unsuccessful and required total assist for donning socks and shoes. Patient stood from EOB with RW and MinA for increased anterior weight shift and VC for proper hand placement. Patient ambulated ~10' to his wheelchair with RW and CGA. VC for staying within the rolling walker throughout transfer and reaching back for the wheelchair prior to sitting. Patient initiated eating his breakfast with his R hand prior to stating he "needed to go to the bathroom." Patient stood from his wheelchair with RW and SBA- MAX external and VC for increased anterior weight shift and proper hand placement with good effort noted. Patient ambulated ~15' to/from wheelchair and bathroom with RW and CGA. Patient doffed brief and pants with MinA. Patient with continent BM and stood from toilet with RW and R grab bar and SBA. [place] standing therapist performed back pericare and donned clean brief and pants. Patient then  ambulated back to his wheelchair where he sat to finish his breakfast with full supervision from therapist- Patient provided with hand sanitizer prior to resuming eating. VC throughout meal for intermittently clearing his throat- One instance of coughing noted while patient was drinking milk with ST notified and aware. Patient left sitting upright in wheelchair in room and transitioned to ST care.    Therapy Documentation Precautions:  Precautions Precautions: Fall, Other (comment) Precaution Comments: incontinent, R hemi, seizure Restrictions Weight Bearing Restrictions: No  Therapy/Group: Individual Therapy  [name] 08/20/2022, 7:57 AM

## 2022-08-21 DIAGNOSIS — R1312 Dysphagia, oropharyngeal phase: Secondary | ICD-10-CM

## 2022-08-21 DIAGNOSIS — R32 Unspecified urinary incontinence: Secondary | ICD-10-CM

## 2022-08-21 DIAGNOSIS — N1831 Chronic kidney disease, stage 3a: Secondary | ICD-10-CM

## 2022-08-21 DIAGNOSIS — I10 Essential (primary) hypertension: Secondary | ICD-10-CM

## 2022-08-21 NOTE — Progress Notes (Signed)
PROGRESS NOTE   Subjective/Complaints:  No new complaints or concerns this morning.  Seen at bedside, therapy getting ready to transfer to wheelchair.  ROS: Denies fevers, chills, headache, N/V, abdominal pain, constipation, diarrhea, SOB, cough, chest pain, new weakness or paraesthesias.    Objective:   No results found. No results for input(s): "WBC", "HGB", "HCT", "PLT" in the last 72 hours.  Recent Labs    08/20/22 1243  NA 137  K 4.3  CL 103  CO2 22  GLUCOSE 100*  BUN 44*  CREATININE 1.69*  CALCIUM 9.1     Intake/Output Summary (Last 24 hours) at 08/21/2022 1524 Last data filed at 08/21/2022 1436 Gross per 24 hour  Intake 570 ml  Output 350 ml  Net 220 ml         Physical Exam: Vital Signs Blood pressure 122/68, pulse 74, temperature 97.7 F (36.5 C), temperature source Oral, resp. rate 18, height 5' 4"$  (1.626 m), weight 53.3 kg, SpO2 100 %.  Constitutional: No distress . Vital signs reviewed. HEENT: NCAT, conjugate gaze,, oral membranes moist Neck: supple Cardiovascular: RRR without murmur. No JVD    Respiratory/Chest: CTA Bilaterally without wheezes or rales. Normal effort   .  Good air movement GI/Abdomen: BS +, non-tender, non-distended Ext: no clubbing, cyanosis, or edema Psych: pleasant and cooperative  GU: No further bag at bedside Skin: TCAR site L neck c.d.I, dermabonded MSK:      No apparent deformity.      Strength: Antigravity and against resistance in all 4 extremities in [facility]   Neurologic exam:  Cognition: AAO to person, place; not time.  + significant memory deficits, + attention deficits, distractable (fixates on TV unless muted) Coordination: no ataxia with FTN bilaterally today, no drift, no tremor at rest, very mild bilateral UE intention tremor    Assessment/Plan: 1. Functional deficits which require 3+ hours per day of interdisciplinary therapy in a comprehensive inpatient  rehab setting. Physiatrist is providing close team supervision and 24 hour management of active medical problems listed below. Physiatrist and rehab team continue to assess barriers to discharge/monitor patient progress toward functional and medical goals  Care Tool:  Bathing    Body parts bathed by patient: Right arm, Left arm, Chest, Abdomen, Right upper leg, Left upper leg, Face, Front perineal area   Body parts bathed by helper: Buttocks, Right lower leg, Left lower leg     Bathing assist Assist Level: Moderate Assistance - Patient 50 - 74%     Upper Body Dressing/Undressing Upper body dressing   What is the patient wearing?: Pull over shirt    Upper body assist Assist Level: Minimal Assistance - Patient > 75%    Lower Body Dressing/Undressing Lower body dressing      What is the patient wearing?: Pants     Lower body assist Assist for lower body dressing: Moderate Assistance - Patient 50 - 74%     Toileting Toileting    Toileting assist Assist for toileting: Moderate Assistance - Patient 50 - 74%     Transfers Chair/bed transfer  Transfers assist     Chair/bed transfer assist level: Minimal Assistance - Patient > 75%  Locomotion Ambulation   Ambulation assist      Assist level: Minimal Assistance - Patient > 75% Assistive device: Walker-rolling Max distance: 150 ft   Walk 10 feet activity   Assist     Assist level: Minimal Assistance - Patient > 75% Assistive device: Walker-rolling   Walk 50 feet activity   Assist    Assist level: Minimal Assistance - Patient > 75% Assistive device: Walker-rolling    Walk 150 feet activity   Assist    Assist level: Minimal Assistance - Patient > 75% Assistive device: Walker-rolling    Walk 10 feet on uneven surface  activity   Assist     Assist level: Moderate Assistance - Patient - 50 - 74% Assistive device: Aeronautical engineer Is the patient using a  wheelchair?: Yes Type of Wheelchair: Manual    Wheelchair assist level: Maximal Assistance - Patient 25 - 49% Max wheelchair distance: 5 ft    Wheelchair 50 feet with 2 turns activity    Assist        Assist Level: Total Assistance - Patient < 25%   Wheelchair 150 feet activity     Assist      Assist Level: Total Assistance - Patient < 25%   Blood pressure 122/68, pulse 74, temperature 97.7 F (36.5 C), temperature source Oral, resp. rate 18, height 5' 4"$  (1.626 m), weight 53.3 kg, SpO2 100 %.  Medical Problem List and Plan: 1. Functional deficits secondary to multiple small acute ischemic CVA infarcts involving the left basal ganglia, left frontal, right occipital cortex and adjacent white matter              -patient may shower             -ELOS/Goals: 10-12 days, supervision/CGA PT, supervision OT, supervision SLP; DC 2/16  -Continue CIR therapies including PT, OT, SLP  - Notable significant cognitive deficits, OP documentation of ?Parkinsonism d/t shuffling gait, tremor, and memory deficits; was not worked up.   2/9: Exam not consistent with parkinsonism, suspect generalized dementia. SLUMS 4/26 on intake. Will start memantine 5 mg daily x7 days, then increase to 5 mg BID as tolerated.   2.  Antithrombotics: -DVT/anticoagulation: Lovenox 30 mg daily  -antiplatelet therapy: DAPT ASA 77m QD + Plavix 739mQD x3 weeks then Plavix alone (~08/29/22) 3. Pain Management: tylenol PRN, oxy 5 mg Q4H PRN.  4. Mood/Behavior/Sleep: Trazodone 25-50 mg PRN             -antipsychotic agents: none 5. Neuropsych/cognition: This patient is capable of making decisions on his own behalf. 6. Skin/Wound Care: Standard skin assessments, wound care.  - Would encourage removal of condom catheter to avoid skin  - removed 2/6; incontinent, PVRs QID and times toiletting Q4H  -2/10 intermittently incontinent, PVRs do not appear elevated, continue to monitor  7.  Fluids/Electrolytes/Nutrition: Dysphagia. Heart healthy diet, thins.    - Note OP workup for oropharyngeal dysphagia 06/2022 - Admission labs stable, mild decrease in NA - repeat wednesday  - 2/5 encourage fluids  - 2/6: Full supervision with D2 thins per SLP d/t choking on food, impulsive eating. Meds crushed. Per SLP was modifying diet at home PTA.    - 2/7: Downgraded to D1; barium swallow pending. Encourage PO intakes today, repeat labs 2/9.   - 2/9: Barium swallow: patient's swallow appeared grossly functional for consistencies tested, will f/u for potential impact on more solid consistencies given clinician presentation at the  bedside. Recommend patient continue current diet of Dys. 2 textures with thin liquids with full supervision to maximize safety with overall PO intake.   -2/10 SLP recommends continuation of Dys 1 textures diet per patient request    8. L internal Carotid Artery stenosis s/p TCAR 08/12/22, continue ASA/Plavix/Crestor             -f/up in 4 weeks with carotid duplex with Dr. Donzetta Matters  9. Seizures: continue Keppra 557m BID  10. HTN: continue Norvasc 531mQD, held home Metoprolol 5065mID during hospitalization but restarted at 12.5mg12mD -Per neurology, allowed for permissive HTN (ok if <220/120) but gradually normalize by 08/14/22, long term normotensive goal    08/21/2022    2:47 PM 08/21/2022    9:08 AM 08/21/2022    4:07 AM  Vitals with BMI  Systolic 122 [id number] 68 61   61 69  Pulse 74 79   79 65   - normotensive; continue current regimen  -2/10 well-controlled continue monitor  11. Acute on Chronic CKD stage 3a: baseline Cr ~1.2, peaked at 2.1, renal U/S 08/06/22 showing chronic medical renal disease but no hydronephrosis             -Avoid nephrotoxic agents, monitoring UOP   - 2/5 labs: Cr 1.6; stable, BUN sl up---push fluids - Cr improved 1.5; BUN continues uptrend - Encourage PO today and repeat Friday - pending  2/10 CR was 1.69 yesterday,  continue to encourage fluid intake 12. Chronic normocytic anemia: baseline hgb 9-10, monitor CBC             -Hgb down to 8.2 after TCAR, stable at 8.0 on 08/13/22; 8.3 2/4; 9.3 2/7 13. Abnormal TSH: TSH 4.9 on admission 08/05/22, free T4 0.88; WNL.    - F/u with PCP 14. Hyponatremia: improved during hospitalization with hydration, monitor BMP   - Decreased 134 -> 132 2/4 -> 138 2/7; WNL    LOS: 7 days A FACE TO FACE EVALUATION WAS PERFORMED  Carlos Boroughs0/2024, 3:24 PM

## 2022-08-21 NOTE — Plan of Care (Signed)
  Problem: Consults Goal: RH STROKE PATIENT EDUCATION Description: See Patient Education module for education specifics  Outcome: Progressing   Problem: RH BOWEL ELIMINATION Goal: RH STG MANAGE BOWEL WITH ASSISTANCE Description: STG Manage Bowel with min Assistance. Outcome: Progressing Goal: RH STG MANAGE BOWEL W/MEDICATION W/ASSISTANCE Description: STG Manage Bowel with Medication with Assistance. Outcome: Progressing   Problem: RH BLADDER ELIMINATION Goal: RH STG MANAGE BLADDER WITH ASSISTANCE Description: STG Manage Bladder With min Assistance Outcome: Progressing   Problem: RH SKIN INTEGRITY Goal: RH STG SKIN FREE OF INFECTION/BREAKDOWN Description: Skin will remain CDI. Incisions will heal without infection and breakdown min assist Outcome: Progressing   Problem: RH SAFETY Goal: RH STG ADHERE TO SAFETY PRECAUTIONS W/ASSISTANCE/DEVICE Description: STG Adhere to Safety Precautions With cueing Assistance/Device. Outcome: Progressing   Problem: RH COGNITION-NURSING Goal: RH STG ANTICIPATES NEEDS/CALLS FOR ASSIST W/ASSIST/CUES Description: STG Anticipates Needs/Calls for Assist With cueing Assistance. Outcome: Progressing   Problem: RH PAIN MANAGEMENT Goal: RH STG PAIN MANAGED AT OR BELOW PT'S PAIN GOAL Description: Pain will be managed 4 out of 10 on pain scale with PRN medications min assist Outcome: Progressing   Problem: RH KNOWLEDGE DEFICIT Goal: RH STG INCREASE KNOWLEDGE OF HYPERTENSION Description: Patient/caregiver will be able to manage HTN medications from nursing education and nursing handouts independently.  Outcome: Progressing Goal: RH STG INCREASE KNOWLEDGE OF STROKE PROPHYLAXIS Description: Patient/caregiver will be able to manage medications and diet modifications to reduce the risk of stroke from nursing education and nursing handouts independently.  Outcome: Progressing

## 2022-08-21 NOTE — Progress Notes (Signed)
Speech Language Pathology Daily Session Note  Patient Details  Name: Carlos Shields MRN: [id number] Date of Birth: 17-Aug-1930  Today's Date: 08/21/2022 SLP Individual Time: 1130-1210 SLP Individual Time Calculation (min): 40 min  Short Term Goals: Week 1: SLP Short Term Goal 1 (Week 1): Pt will utilize external aids to recall daily information with mod assist multimodal cues. SLP Short Term Goal 2 (Week 1): Pt will complete basic, famliar tasks with mod assist for functional problem solving. SLP Short Term Goal 3 (Week 1): Pt will recognize and correct errors when completing functional tasks with mod assist multimodal cues. SLP Short Term Goal 4 (Week 1): Patient will consume current diet with minimal overt s/s of aspiration and Min verbal cues for use of swallowing compensatory strategies.  Skilled Therapeutic Interventions: Pt seen this date for skilled ST intervention targeting cognitive goals outlined above. Pt received sleeping in bed, aroused easily to name. Agreeable to intervention in dayroom. Assisted pt with brief change and transferred from bed to w/c with RW and Mod A from this therapist.  SLP facilitated today's session by providing Mod-Max A verbal and visual cues to sequence 4-step ADL pictures with 100% accuracy; pt unable to complete the task when cues were faded. Required Mod to Max A for problem-solving with remote in room. Sustained attention to therapeutic tasks for 30 minutes given rest breaks and Sup A. Continues to require re-education re: diet recommendations given decreased recall.   Pt returned to room and left OOB in w/c with all safety measures activated, call bell within reach, and all immediate needs met. Continue per current ST POC.   Pain No pain reported; NAD  Therapy/Group: Individual Therapy  Hamdi Vari A Siddhant Hashemi 08/21/2022, 3:58 PM

## 2022-08-22 DIAGNOSIS — E871 Hypo-osmolality and hyponatremia: Secondary | ICD-10-CM

## 2022-08-22 DIAGNOSIS — D649 Anemia, unspecified: Secondary | ICD-10-CM

## 2022-08-22 NOTE — Progress Notes (Signed)
Occupational Therapy Session Note  Patient Details  Name: Carlos Shields MRN: [id number] Date of Birth: 1930/12/09  Today's Date: 08/22/2022 OT Individual Time: 1300-1341 OT Individual Time Calculation (min): 41 min    Short Term Goals: Week 1:  OT Short Term Goal 1 (Week 1): Pt will perform toilet transfers with LRAD with CGA OT Short Term Goal 2 (Week 1): Pt will perform 3/3 toileting tasks with no more than CGA OT Short Term Goal 3 (Week 1): Pt will improve RUE MMT to 3/5 OT Short Term Goal 4 (Week 1): Pt will perform UB/LB dress with CGA  Skilled Therapeutic Interventions/Progress Updates:    Pt amb with RW to day room and sat on NuStep-7 mins level 4 BLE only. Pt stood for game of cornhole. CGA for amb and standing balance. Pt continues to require min verbal cues for UE placement with sit<>stand. Pt returned to room and remained in w/c with all needs within reach. Belt alarm activated.   Therapy Documentation Precautions:  Precautions Precautions: Fall, Other (comment) Precaution Comments: incontinent, R hemi, seizure Restrictions Weight Bearing Restrictions: No Pain: Pain Assessment Pain Scale: 0-10 Pain Score: 0-No pain   Therapy/Group: Individual Therapy  Leroy Libman 08/22/2022, 1:43 PM

## 2022-08-22 NOTE — Progress Notes (Signed)
Occupational Therapy Session Note  Patient Details  Name: Ahkeem Gawronski MRN: [id number] Date of Birth: [birth year]  Today's Date: 08/22/2022 OT Individual Time: [id number] OT Individual Time Calculation (min): 58 min    Short Term Goals: Week 1:  OT Short Term Goal 1 (Week 1): Pt will perform toilet transfers with LRAD with CGA OT Short Term Goal 2 (Week 1): Pt will perform 3/3 toileting tasks with no more than CGA OT Short Term Goal 3 (Week 1): Pt will improve RUE MMT to 3/5 OT Short Term Goal 4 (Week 1): Pt will perform UB/LB dress with CGA  Skilled Therapeutic Interventions/Progress Updates:    Pt resting in bed upon arrival and agreeable to getting OOB and getting dresses. Supine>sit EOB with supervision using bed rails. Pt requested to use toilet. Sit>stand with mod A with posterior lean noted. Amb with RW into bathroom with min A. Pt required max A for toileting tasks. Pt returned to room and got dressesed with sit<>stand from w/c. UB/LB dressing with min A. Pt amb with RW in room with min A. Pt stood at sink to brush teeth. Pt remained in w/c with all needs within reach. Belt alarm activated.   Therapy Documentation Precautions:  Precautions Precautions: Fall, Other (comment) Precaution Comments: incontinent, R hemi, seizure Restrictions Weight Bearing Restrictions: No  Pain:  Pt denies pain this morning    Therapy/Group: Individual Therapy  Leroy Libman 08/22/2022, 9:14 AM

## 2022-08-22 NOTE — Progress Notes (Signed)
PROGRESS NOTE   Subjective/Complaints:  No new concerns elicited. Working with therapy in gym today.   Review of Systems  Constitutional:  Negative for fever.  Respiratory:  Negative for shortness of breath.   Cardiovascular:  Negative for chest pain.  Gastrointestinal:  Negative for blood in stool.  Genitourinary: Negative.   Neurological:  Negative for headaches.      Objective:   No results found. No results for input(s): "WBC", "HGB", "HCT", "PLT" in the last 72 hours.  Recent Labs    08/20/22 1243  NA 137  K 4.3  CL 103  CO2 22  GLUCOSE 100*  BUN 44*  CREATININE 1.69*  CALCIUM 9.1     Intake/Output Summary (Last 24 hours) at 08/22/2022 2016 Last data filed at 08/22/2022 2010 Gross per 24 hour  Intake 560 ml  Output 500 ml  Net 60 ml         Physical Exam: Vital Signs Blood pressure 135/70, pulse 91, temperature 98 F (36.7 C), resp. rate 17, height 5' 4"$  (1.626 m), weight 53.3 kg, SpO2 100 %.  Constitutional: No distress . Vital signs reviewed. Working with therapy in gym HEENT: NCAT, conjugate gaze, oral membranes moist Neck: supple Cardiovascular: RRR without murmur. No JVD    Respiratory/Chest: CTA Bilaterally without wheezes or rales. Normal effort   .  Good air movement GI/Abdomen: BS +, non-tender, non-distended Ext: no clubbing, cyanosis, or edema Psych: pleasant and cooperative  Skin: TCAR site L neck c.d.I, dermabonded ortherwise warm and dry MSK:      No apparent deformity.      Strength: Antigravity and against resistance in all 4 extremities in [facility]   Neurologic exam:  Cognition: AAO to person, place; not time.  + significant memory deficits, + attention deficits, distractable   Coordination: no ataxia with FTN bilaterally today, no drift, no tremor at rest, very mild bilateral UE intention tremor    Assessment/Plan: 1. Functional deficits which require 3+ hours per day of  interdisciplinary therapy in a comprehensive inpatient rehab setting. Physiatrist is providing close team supervision and 24 hour management of active medical problems listed below. Physiatrist and rehab team continue to assess barriers to discharge/monitor patient progress toward functional and medical goals  Care Tool:  Bathing    Body parts bathed by patient: Right arm, Left arm, Chest, Abdomen, Right upper leg, Left upper leg, Face, Front perineal area   Body parts bathed by helper: Buttocks, Right lower leg, Left lower leg     Bathing assist Assist Level: Moderate Assistance - Patient 50 - 74%     Upper Body Dressing/Undressing Upper body dressing   What is the patient wearing?: Pull over shirt    Upper body assist Assist Level: Minimal Assistance - Patient > 75%    Lower Body Dressing/Undressing Lower body dressing      What is the patient wearing?: Pants     Lower body assist Assist for lower body dressing: Minimal Assistance - Patient > 75%     Toileting Toileting    Toileting assist Assist for toileting: Moderate Assistance - Patient 50 - 74%     Transfers Chair/bed transfer  Transfers assist  Chair/bed transfer assist level: Minimal Assistance - Patient > 75%     Locomotion Ambulation   Ambulation assist      Assist level: Minimal Assistance - Patient > 75% Assistive device: Walker-rolling Max distance: 150 ft   Walk 10 feet activity   Assist     Assist level: Minimal Assistance - Patient > 75% Assistive device: Walker-rolling   Walk 50 feet activity   Assist    Assist level: Minimal Assistance - Patient > 75% Assistive device: Walker-rolling    Walk 150 feet activity   Assist    Assist level: Minimal Assistance - Patient > 75% Assistive device: Walker-rolling    Walk 10 feet on uneven surface  activity   Assist     Assist level: Moderate Assistance - Patient - 50 - 74% Assistive device: Horticulturist, commercial Is the patient using a wheelchair?: Yes Type of Wheelchair: Manual    Wheelchair assist level: Maximal Assistance - Patient 25 - 49% Max wheelchair distance: 5 ft    Wheelchair 50 feet with 2 turns activity    Assist        Assist Level: Total Assistance - Patient < 25%   Wheelchair 150 feet activity     Assist      Assist Level: Total Assistance - Patient < 25%   Blood pressure 135/70, pulse 91, temperature 98 F (36.7 C), resp. rate 17, height 5' 4"$  (1.626 m), weight 53.3 kg, SpO2 100 %.  Medical Problem List and Plan: 1. Functional deficits secondary to multiple small acute ischemic CVA infarcts involving the left basal ganglia, left frontal, right occipital cortex and adjacent white matter              -patient may shower             -ELOS/Goals: 10-12 days, supervision/CGA PT, supervision OT, supervision SLP; DC 2/16  -Continue CIR therapies including PT, OT, SLP  - Notable significant cognitive deficits, OP documentation of ?Parkinsonism d/t shuffling gait, tremor, and memory deficits; was not worked up.   2/9: Exam not consistent with parkinsonism, suspect generalized dementia. SLUMS 4/26 on intake. Will start memantine 5 mg daily x7 days, then increase to 5 mg BID as tolerated.   2.  Antithrombotics: -DVT/anticoagulation: Lovenox 30 mg daily  -antiplatelet therapy: DAPT ASA 68m QD + Plavix 729mQD x3 weeks then Plavix alone (~08/29/22) 3. Pain Management: tylenol PRN, oxy 5 mg Q4H PRN.  4. Mood/Behavior/Sleep: Trazodone 25-50 mg PRN             -antipsychotic agents: none 5. Neuropsych/cognition: This patient is capable of making decisions on his own behalf. 6. Skin/Wound Care: Standard skin assessments, wound care.  - Would encourage removal of condom catheter to avoid skin  - removed 2/6; incontinent, PVRs QID and times toiletting Q4H  -2/11 PVRs not elevated, occasional incontinence, appears improved   7.  Fluids/Electrolytes/Nutrition: Dysphagia. Heart healthy diet, thins.    - Note OP workup for oropharyngeal dysphagia 06/2022 - Admission labs stable, mild decrease in NA - repeat wednesday  - 2/5 encourage fluids  - 2/6: Full supervision with D2 thins per SLP d/t choking on food, impulsive eating. Meds crushed. Per SLP was modifying diet at home PTA.    - 2/7: Downgraded to D1; barium swallow pending. Encourage PO intakes today, repeat labs 2/9.   - 2/9: Barium swallow: patient's swallow appeared grossly functional for consistencies tested, will f/u for potential impact  on more solid consistencies given clinician presentation at the bedside. Recommend patient continue current diet of Dys. 2 textures with thin liquids with full supervision to maximize safety with overall PO intake.   -2/10 SLP recommends continuation of Dys 1 textures diet per patient request    8. L internal Carotid Artery stenosis s/p TCAR 08/12/22, continue ASA/Plavix/Crestor             -f/up in 4 weeks with carotid duplex with Dr. Donzetta Matters  9. Seizures: continue Keppra 535m BID  10. HTN: continue Norvasc 598mQD, held home Metoprolol 5030mID during hospitalization but restarted at 12.5mg37mD -Per neurology, allowed for permissive HTN (ok if <220/120) but gradually normalize by 08/14/22, long term normotensive goal    08/22/2022    8:11 PM 08/22/2022    1:05 PM 08/22/2022    7:19 AM  Vitals with BMI  Systolic 135 [id number] 70 70 74  Pulse 91 75 70   - normotensive; continue current regimen  -2/11 well controlled  11. Acute on Chronic CKD stage 3a: baseline Cr ~1.2, peaked at 2.1, renal U/S 08/06/22 showing chronic medical renal disease but no hydronephrosis             -Avoid nephrotoxic agents, monitoring UOP   - 2/5 labs: Cr 1.6; stable, BUN sl up---push fluids - Cr improved 1.5; BUN continues uptrend - Encourage PO today and repeat Friday - pending  2/10 CR was 1.69 yesterday, continue to encourage fluid  intake  -Recheck tomorrow  12. Chronic normocytic anemia: baseline hgb 9-10, monitor CBC             -Hgb down to 8.2 after TCAR, stable at 8.0 on 08/13/22; 8.3 2/4; 9.3 2/7  Recheck tomorrow-ordered 13. Abnormal TSH: TSH 4.9 on admission 08/05/22, free T4 0.88; WNL.    - F/u with PCP 14. Hyponatremia: improved during hospitalization with hydration, monitor BMP   - Decreased 134 -> 132 2/4 -> 138 2/7; WNL  Recheck tomorrow    LOS: 8 days A FACE TO FACE EVALUATION WAS PERFORMED  YuriJennye Boroughs1/2024, 8:16 PM

## 2022-08-22 NOTE — Plan of Care (Signed)
  Problem: Consults Goal: RH STROKE PATIENT EDUCATION Description: See Patient Education module for education specifics  Outcome: Progressing   Problem: RH BOWEL ELIMINATION Goal: RH STG MANAGE BOWEL WITH ASSISTANCE Description: STG Manage Bowel with min Assistance. Outcome: Progressing Goal: RH STG MANAGE BOWEL W/MEDICATION W/ASSISTANCE Description: STG Manage Bowel with Medication with Assistance. Outcome: Progressing   Problem: RH BLADDER ELIMINATION Goal: RH STG MANAGE BLADDER WITH ASSISTANCE Description: STG Manage Bladder With min Assistance Outcome: Progressing   Problem: RH SKIN INTEGRITY Goal: RH STG SKIN FREE OF INFECTION/BREAKDOWN Description: Skin will remain CDI. Incisions will heal without infection and breakdown min assist Outcome: Progressing   Problem: RH SAFETY Goal: RH STG ADHERE TO SAFETY PRECAUTIONS W/ASSISTANCE/DEVICE Description: STG Adhere to Safety Precautions With cueing Assistance/Device. Outcome: Progressing   Problem: RH COGNITION-NURSING Goal: RH STG ANTICIPATES NEEDS/CALLS FOR ASSIST W/ASSIST/CUES Description: STG Anticipates Needs/Calls for Assist With cueing Assistance. Outcome: Progressing   Problem: RH PAIN MANAGEMENT Goal: RH STG PAIN MANAGED AT OR BELOW PT'S PAIN GOAL Description: Pain will be managed 4 out of 10 on pain scale with PRN medications min assist Outcome: Progressing   Problem: RH KNOWLEDGE DEFICIT Goal: RH STG INCREASE KNOWLEDGE OF HYPERTENSION Description: Patient/caregiver will be able to manage HTN medications from nursing education and nursing handouts independently.  Outcome: Progressing Goal: RH STG INCREASE KNOWLEDGE OF STROKE PROPHYLAXIS Description: Patient/caregiver will be able to manage medications and diet modifications to reduce the risk of stroke from nursing education and nursing handouts independently.  Outcome: Progressing   

## 2022-08-23 LAB — CBC
HCT: 29.5 % — ABNORMAL LOW (ref 39.0–52.0)
Hemoglobin: 9.2 g/dL — ABNORMAL LOW (ref 13.0–17.0)
MCH: 28.7 pg (ref 26.0–34.0)
MCHC: 31.2 g/dL (ref 30.0–36.0)
MCV: 91.9 fL (ref 80.0–100.0)
Platelets: 273 10*3/uL (ref 150–400)
RBC: 3.21 MIL/uL — ABNORMAL LOW (ref 4.22–5.81)
RDW: 12.7 % (ref 11.5–15.5)
WBC: 6.7 10*3/uL (ref 4.0–10.5)
nRBC: 0 % (ref 0.0–0.2)

## 2022-08-23 LAB — BASIC METABOLIC PANEL
Anion gap: 9 (ref 5–15)
BUN: 43 mg/dL — ABNORMAL HIGH (ref 8–23)
CO2: 25 mmol/L (ref 22–32)
Calcium: 9.2 mg/dL (ref 8.9–10.3)
Chloride: 107 mmol/L (ref 98–111)
Creatinine, Ser: 1.67 mg/dL — ABNORMAL HIGH (ref 0.61–1.24)
GFR, Estimated: 39 mL/min — ABNORMAL LOW (ref >60)
Glucose, Bld: 99 mg/dL (ref 70–99)
Potassium: 4.4 mmol/L (ref 3.5–5.1)
Sodium: 141 mmol/L (ref 135–145)

## 2022-08-23 MED ORDER — LACTATED RINGERS IV SOLN: INTRAVENOUS | Status: AC

## 2022-08-23 MED ORDER — LACTATED RINGERS IV BOLUS: 1000.0000 mL | Freq: Once | INTRAVENOUS | Status: DC

## 2022-08-23 NOTE — Progress Notes (Signed)
Physical Therapy Weekly Progress Note  Patient Details  Name: Carlos Shields MRN: [id number] Date of Birth: 04-Oct-1930  Beginning of progress report period: August 15, 2022 End of progress report period: August 23, 2022  Today's Date: 08/23/2022 PT Individual Time: 1st Treatment Session: 0900-1000; 2nd Treatment Session: 1300-1330  PT Individual Time Calculation (min): 60 min; 30 min  Patient has met 3 of 3 short term goals. Currently patient requires CGA for bed mobility, CGA/MinA for sit/stands, SBA for ambulation and MinA for stair mobility. Patient is limited by flexed posturing and posterior bias leading to impaired balance.   Patient continues to demonstrate the following deficits muscle weakness, decreased cardiorespiratoy endurance, posterior bias, flexed posturing, and decreased standing balance and decreased balance strategies and therefore will continue to benefit from skilled PT intervention to increase functional independence with mobility.  Patient progressing toward long term goals..  Plan of care revisions: Upgraded ambulation goals to supervision to more accurately reflect patient's CLOF.  PT Short Term Goals Week 1:  PT Short Term Goal 1 (Week 1): Pt will require CGA for dynamic standing balance with LRAD PT Short Term Goal 1 - Progress (Week 1): Met PT Short Term Goal 2 (Week 1): Pt will require CGA with gait x 50 ft with LRAD PT Short Term Goal 2 - Progress (Week 1): Met PT Short Term Goal 3 (Week 1): Pt will require min A for stair navigation x 4 with 2 HR's PT Short Term Goal 3 - Progress (Week 1): Met Week 2:  PT Short Term Goal 1 (Week 2): STGs=LTGs secondary to ELOS  Skilled Therapeutic Interventions/Progress Updates:  1st Treatment Session- Patient greeted semi-reclined in bed and agreeable to PT treatment session. Patient transitioned from semi-reclined to sitting EOB with SBA and use of bed rail. While sitting EOB, patient donned shirt and threaded pants with  MinA- Therapist donned socks and shoes for time management. Patient stood from EOB with RW and CGA- VC for increased anterior weight shift and proper hand placement. While standing, therapist pulled pants over hips with CGA for balance. Patient then ambulated from room to day room (>150') with RW and SBA/Supv for safety- VC for stepping within the frame of the RW, increased B step length and improved postural extension with good improvements noted however unable to sustain.   Patient gait trained x187' with RW and SBA for safety- VC for affor-mentioned gait deviations with notable decrease in cadence noted as patient fatigued.   Patient weaved between x8 cones, x2 trials with RW and SBA- Patient required increased time to complete activity as patient demonstrated decreased gait cadence, especially when turning. With therapist advancing RW, patient was able to increase gait cadence however unable to sustain.   Patient ambulated back to his room with RW and SBA for safety- Improved cadence noted, however unable to sustain as he fatigues quickly. Patient left sitting upright in wheelchair in room with posey belt on, call bell within reach, daughter present and all needs met.    2nd Treatment Session- Patient greeted sitting upright in wheelchair in room with NT present assisting with lunch and agreeable to PT treatment session. Patient stood from wheelchair with RW and CGA- VC for proper hand placement and anterior weight shift throughout stand. Patient gait trained to/from room and rehab gym with RW and SBA for safety- VC for stepping within the RW, increased B step length and improved postural extension. Patient tasked with reaching B UE out in front of him for a 2kg  medicine ball on a stool, lift his bottom, pick up the ball, stand up and reach ball overhead, return ball to stool and then return to a seated position. Performed in order to facilitate improved anterior weight shift when standing. Patient  performed x10 total with CGA for safety. Patient then performed x5 sit/stands with RW in order to carryover task from previous activity. Patient returned to his room sitting upright in wheelchair with call bell within reach, posey belt on, tray table in front and all needs met.    Therapy Documentation Precautions:  Precautions Precautions: Fall, Other (comment) Precaution Comments: incontinent, R hemi, seizure Restrictions Weight Bearing Restrictions: No   Therapy/Group: Individual Therapy  Massiel Stipp 08/23/2022, 8:00 AM

## 2022-08-23 NOTE — Progress Notes (Signed)
Occupational Therapy Weekly Progress Note  Patient Details  Name: Carlos Shields MRN: [id number] Date of Birth: 04/30/1931  Beginning of progress report period: August 15, 2022 End of progress report period: August 23, 2022  Today's Date: 08/23/2022 OT Individual Time: 1405-1500 OT Individual Time Calculation (min): 55 min    Patient has met 3 of 4 short term goals.  Patient is making steady progress towards OT goals. He is mostly min/CGA for all BADL tasks and as little as supervision for functional ambulation. He still needs up to CGA/min A to stand for anterior weight shift, but this is progressing as well. Continue current POC.   Patient continues to demonstrate the following deficits: muscle weakness, decreased cardiorespiratoy endurance, impaired timing and sequencing, unbalanced muscle activation, decreased coordination, and decreased motor planning, decreased attention to right, decreased initiation, decreased problem solving, decreased safety awareness, and decreased memory, and decreased sitting balance, decreased standing balance, hemiplegia, and decreased balance strategies and therefore will continue to benefit from skilled OT intervention to enhance overall performance with BADL and Reduce care partner burden.  Patient progressing toward long term goals..  Continue plan of care.  OT Short Term Goals Week 1:  OT Short Term Goal 1 (Week 1): Pt will perform toilet transfers with LRAD with CGA OT Short Term Goal 1 - Progress (Week 1): Met OT Short Term Goal 2 (Week 1): Pt will perform 3/3 toileting tasks with no more than CGA OT Short Term Goal 2 - Progress (Week 1): Met OT Short Term Goal 3 (Week 1): Pt will improve RUE MMT to 3/5 OT Short Term Goal 3 - Progress (Week 1): Met OT Short Term Goal 4 (Week 1): Pt will perform UB/LB dress with CGA OT Short Term Goal 4 - Progress (Week 1): Progressing toward goal Week 2:  OT Short Term Goal 1 (Week 2): LTG =STG 2/2 ELOS  Skilled  Therapeutic Interventions/Progress Updates:    Pt greeted seated in wc and agreeable to OT treatment session. Pt reported need to go to the bathroom. Pt stood from wc with min A and cues for anterior weight shift on to RW. Pt ambulated to bathroom w/ RW and CGA. Verbal cues for RW positioning over commode and cues for pt to initiate doffing pants. Pt with continent void of bladder into commode. OT provided cues for RW positioning at the sink to safely stand and wash hands. Pt then ambulated to therapy gym w/ RW and mostly close supervision. R hand FMC with medium soft yellow theraputty and therapy beads. Pt needed extended time, but was able to locate 10/10 beads wth min cues. R hand there-ex continued with putty hand exercises with focus on grip and pinch strength. Pt ambulated back to room with RW and close supevrision. Pt returned to wc and left seated in wc with alarm belt on, call bell in reach, and needs met.   Therapy Documentation Precautions:  Precautions Precautions: Fall, Other (comment) Precaution Comments: incontinent, R hemi, seizure Restrictions Weight Bearing Restrictions: No Pain:  Denies pain   Therapy/Group: Individual Therapy  Valma Cava 08/23/2022, 2:17 PM

## 2022-08-23 NOTE — Plan of Care (Signed)
Ambulation goals upgraded to Supervision to more accurately reflect patient's current level of function.  Problem: RH Ambulation Goal: LTG Patient will ambulate in controlled environment (PT) Description: LTG: Patient will ambulate in a controlled environment, # of feet with assistance (PT). Flowsheets (Taken 08/23/2022 0934) LTG: Pt will ambulate in controlled environ  assist needed:: Supervision/Verbal cueing Goal: LTG Patient will ambulate in home environment (PT) Description: LTG: Patient will ambulate in home environment, # of feet with assistance (PT). Flowsheets (Taken 08/23/2022 0934) LTG: Pt will ambulate in home environ  assist needed:: Supervision/Verbal cueing

## 2022-08-23 NOTE — Progress Notes (Signed)
Patient with new orders for LR. Patient has no IV access. Consulted IV team for placement.

## 2022-08-23 NOTE — Progress Notes (Signed)
PROGRESS NOTE   Subjective/Complaints:  No acute complaints. No events overnight. Eating breakfast, denies coughing or choking.    ROS: Denies fevers, chills, N/V, abdominal pain, constipation, diarrhea, SOB, cough, chest pain, new weakness or paraesthesias.     Objective:   No results found. Recent Labs    08/23/22 0908  WBC 6.7  HGB 9.2*  HCT 29.5*  PLT 273    Recent Labs    08/23/22 0908  NA 141  K 4.4  CL 107  CO2 25  GLUCOSE 99  BUN 43*  CREATININE 1.67*  CALCIUM 9.2     Intake/Output Summary (Last 24 hours) at 08/23/2022 1652 Last data filed at 08/23/2022 0948 Gross per 24 hour  Intake 356 ml  Output 600 ml  Net -244 ml         Physical Exam: Vital Signs Blood pressure (!) 122/57, pulse 75, temperature 97.7 F (36.5 C), temperature source Oral, resp. rate 17, height 5' 4"$  (1.626 m), weight 53.3 kg, SpO2 100 %.  Constitutional: No distress . Vital signs reviewed. Working with therapy in gym HEENT: NCAT, conjugate gaze, oral membranes moist Neck: supple Cardiovascular: RRR without murmur. No JVD    Respiratory/Chest: CTA Bilaterally without wheezes or rales. Normal effort   .  Good air movement GI/Abdomen: BS +, non-tender, non-distended Ext: no clubbing, cyanosis, or edema Psych: pleasant and cooperative  Skin: TCAR site L neck c.d.I  MSK: Antigravity and against resistance in all 4 extremities in bed   Neurologic exam:  Cognition: AAO to person, place and time with options; improved speed.  + significant memory deficits   Coordination: +very mild bilateral UE intention tremor    Assessment/Plan: 1. Functional deficits which require 3+ hours per day of interdisciplinary therapy in a comprehensive inpatient rehab setting. Physiatrist is providing close team supervision and 24 hour management of active medical problems listed below. Physiatrist and rehab team continue to assess barriers  to discharge/monitor patient progress toward functional and medical goals  Care Tool:  Bathing    Body parts bathed by patient: Right arm, Left arm, Chest, Abdomen, Right upper leg, Left upper leg, Face, Front perineal area   Body parts bathed by helper: Buttocks, Right lower leg, Left lower leg     Bathing assist Assist Level: Moderate Assistance - Patient 50 - 74%     Upper Body Dressing/Undressing Upper body dressing   What is the patient wearing?: Pull over shirt    Upper body assist Assist Level: Minimal Assistance - Patient > 75%    Lower Body Dressing/Undressing Lower body dressing      What is the patient wearing?: Pants     Lower body assist Assist for lower body dressing: Minimal Assistance - Patient > 75%     Toileting Toileting    Toileting assist Assist for toileting: Moderate Assistance - Patient 50 - 74%     Transfers Chair/bed transfer  Transfers assist     Chair/bed transfer assist level: Supervision/Verbal cueing     Locomotion Ambulation   Ambulation assist      Assist level: Supervision/Verbal cueing Assistive device: Walker-rolling Max distance: 150 ft   Walk 10 feet activity  Assist     Assist level: Supervision/Verbal cueing Assistive device: Walker-rolling   Walk 50 feet activity   Assist    Assist level: Supervision/Verbal cueing Assistive device: Walker-rolling    Walk 150 feet activity   Assist    Assist level: Supervision/Verbal cueing Assistive device: Walker-rolling    Walk 10 feet on uneven surface  activity   Assist     Assist level: Moderate Assistance - Patient - 50 - 74% Assistive device: Aeronautical engineer Is the patient using a wheelchair?: Yes Type of Wheelchair: Manual    Wheelchair assist level: Maximal Assistance - Patient 25 - 49% Max wheelchair distance: 5 ft    Wheelchair 50 feet with 2 turns activity    Assist        Assist Level: Total  Assistance - Patient < 25%   Wheelchair 150 feet activity     Assist      Assist Level: Total Assistance - Patient < 25%   Blood pressure (!) 122/57, pulse 75, temperature 97.7 F (36.5 C), temperature source Oral, resp. rate 17, height 5' 4"$  (1.626 m), weight 53.3 kg, SpO2 100 %.  Medical Problem List and Plan: 1. Functional deficits secondary to multiple small acute ischemic CVA infarcts involving the left basal ganglia, left frontal, right occipital cortex and adjacent white matter              -patient may shower             -ELOS/Goals: 10-12 days, supervision/CGA PT, supervision OT, supervision SLP; DC 2/16  -Continue CIR therapies including PT, OT, SLP  - Notable significant cognitive deficits, OP documentation of ?Parkinsonism d/t shuffling gait, tremor, and memory deficits; was not worked up.   2/9: Exam not consistent with parkinsonism, suspect generalized dementia. SLUMS 4/26 on intake. Will start memantine 5 mg daily x7 days, then increase to 5 mg BID as tolerated.  -> 2/12 cognitive speed appears improved; continue current dose  2.  Antithrombotics: -DVT/anticoagulation: Lovenox 30 mg daily  -antiplatelet therapy: DAPT ASA 72m QD + Plavix 742mQD x3 weeks then Plavix alone (~08/29/22) 3. Pain Management: tylenol PRN, oxy 5 mg Q4H PRN.  4. Mood/Behavior/Sleep: Trazodone 25-50 mg PRN             -antipsychotic agents: none 5. Neuropsych/cognition: This patient is capable of making decisions on his own behalf. 6. Skin/Wound Care: Standard skin assessments, wound care.  - Would encourage removal of condom catheter to avoid skin  - removed 2/6; incontinent, PVRs QID and times toiletting Q4H  -2/11 PVRs not elevated, occasional incontinence, appears improved   7. Fluids/Electrolytes/Nutrition: Dysphagia. Heart healthy diet, thins.    - Note OP workup for oropharyngeal dysphagia 06/2022 - Admission labs stable, mild decrease in NA - repeat wednesday  - 2/5 encourage  fluids  - 2/6: Full supervision with D2 thins per SLP d/t choking on food, impulsive eating. Meds crushed. Per SLP was modifying diet at home PTA.    - 2/7: Downgraded to D1; barium swallow pending. Encourage PO intakes today, repeat labs 2/9.   - 2/9: Barium swallow: patient's swallow appeared grossly functional for consistencies tested, will f/u for potential impact on more solid consistencies given clinician presentation at the bedside. Recommend patient continue current diet of Dys. 2 textures with thin liquids with full supervision to maximize safety with overall PO intake.   -2/10 SLP recommends continuation of Dys 1 textures diet per patient request -  tolerating    8. L internal Carotid Artery stenosis s/p TCAR 08/12/22, continue ASA/Plavix/Crestor             -f/up in 4 weeks with carotid duplex with Dr. Donzetta Matters  9. Seizures: continue Keppra 576m BID  10. HTN: continue Norvasc 570mQD, held home Metoprolol 5055mID during hospitalization but restarted at 12.5mg82mD -Per neurology, allowed for permissive HTN (ok if <220/120) but gradually normalize by 08/14/22, long term normotensive goal    08/23/2022    2:15 PM 08/23/2022    3:40 AM 08/22/2022    8:11 PM  Vitals with BMI  Systolic 122 [id number] 57 62 70  Pulse 75 69 91    -2/11-12 well controlled  11. Acute on Chronic CKD stage 3a: baseline Cr ~1.2, peaked at 2.1, renal U/S 08/06/22 showing chronic medical renal disease but no hydronephrosis             -Avoid nephrotoxic agents, monitoring UOP   - 2/5 labs: Cr 1.6; stable, BUN sl up---push fluids - Cr improved 1.5 -> 1.69 2/10 -> 1.67 2/12 with BUN increase 32-> 44   - Add IVF 1 L at 75 cc/hr overnight; recheck BMP in AM  12. Chronic normocytic anemia: baseline hgb 9-10, monitor CBC - stable             -Hgb down to 8.2 after TCAR, stable at 8.0 on 08/13/22; 8.3 2/4; 9.3 2/7-> 9.2 9/12  13. Abnormal TSH: TSH 4.9 on admission 08/05/22, free T4 0.88; WNL.    - F/u with PCP 14.  Hyponatremia: improved during hospitalization with hydration, monitor BMP   - Decreased 134 -> 132 2/4 -> 138 2/7 -> 141 2/12  - Fluids as above    LOS: 9 days A FACE TO FACE EVALUATION WAS PERFORMED  MorgGertie Gowda2/2024, 4:52 PM

## 2022-08-23 NOTE — Progress Notes (Signed)
Speech Language Pathology Weekly Progress and Session Note  Patient Details  Name: Carlos Shields MRN: [id number] Date of Birth: 15-Feb-1931  Beginning of progress report period: August 15, 2022 End of progress report period: August 23, 2022  Today's Date: 08/23/2022 SLP Individual Time: [id number] SLP Individual Time Calculation (min): 40 min  Short Term Goals: Week 1: SLP Short Term Goal 1 (Week 1): Pt will utilize external aids to recall daily information with mod assist multimodal cues. SLP Short Term Goal 1 - Progress (Week 1): Not met SLP Short Term Goal 2 (Week 1): Pt will complete basic, famliar tasks with mod assist for functional problem solving. SLP Short Term Goal 2 - Progress (Week 1): Met SLP Short Term Goal 3 (Week 1): Pt will recognize and correct errors when completing functional tasks with mod assist multimodal cues. SLP Short Term Goal 3 - Progress (Week 1): Met SLP Short Term Goal 4 (Week 1): Patient will consume current diet with minimal overt s/s of aspiration and Min verbal cues for use of swallowing compensatory strategies. SLP Short Term Goal 4 - Progress (Week 1): Met    New Short Term Goals: Week 2: SLP Short Term Goal 1 (Week 2): STGs=LTGs due to ELOS  Weekly Progress Updates: Patient has made functional gains and has met 1 of 4 STGs this reporting period. Currently, patient is consuming Dys. 1 textures with thin liquids with minimal overt s/s of aspiration and overall Min verbal cues for use of swallowing compensatory strategies.  Patient also requires overall Mod-Max A multimodal cues to complete functional and familiar tasks safely in regards to problem solving, recall with use of strategies and emergent awareness. Patient and family education ongoing. Patient would benefit from continued skilled SLP intervention to maximize his swallowing and cognitive functioning prior to discharge.     Intensity: Minumum of 1-2 x/day, 30 to 90 minutes Frequency: 3 to 5 out  of 7 days Duration/Length of Stay: 08/27/22 Treatment/Interventions: Dysphagia/aspiration precaution training;Internal/external aids;Cueing hierarchy;Environmental controls;Therapeutic Activities;Functional tasks;Patient/family education   Daily Session  Skilled Therapeutic Interventions:  Skilled treatment session focused on cognitive and dysphagia goals. Upon arrival, patient was awake in bed appeared lethargic with extra time needed to rouse. Patient had been incontinent of urine and declined getting out of bed. Patient participated in bed mobility independently during peri care.  Patient consumed breakfast meal of Dys. 1 textures with thin liquids via cup with Mod verbal cues needed for use of swallowing compensatory strategies. Patient with subtle throat clearing X 3 throughout meal, suspect due to decreased attention to bolus at times. Recommend patient continue current diet. Min verbal cues were also needed for functional problem solving during tray set-up and self-feeding. Patient left upright in bed with alarm on and all needs within reach. Continue with current plan of care.    Pain No/Denies Pain   Therapy/Group: Individual Therapy  Jamina Macbeth 08/23/2022, 6:30 AM

## 2022-08-24 LAB — BASIC METABOLIC PANEL
Anion gap: 6 (ref 5–15)
BUN: 47 mg/dL — ABNORMAL HIGH (ref 8–23)
CO2: 26 mmol/L (ref 22–32)
Calcium: 8.7 mg/dL — ABNORMAL LOW (ref 8.9–10.3)
Chloride: 105 mmol/L (ref 98–111)
Creatinine, Ser: 1.61 mg/dL — ABNORMAL HIGH (ref 0.61–1.24)
GFR, Estimated: 41 mL/min — ABNORMAL LOW (ref >60)
Glucose, Bld: 97 mg/dL (ref 70–99)
Potassium: 4.3 mmol/L (ref 3.5–5.1)
Sodium: 137 mmol/L (ref 135–145)

## 2022-08-24 NOTE — Progress Notes (Signed)
Patient ID: Carlos Shields, male   DOB: [birth year], 89 y.o.   MRN: [id number]  SW returned phone call to pt dtr [name] to confirm family edu this afternoon 1pm-4pm. She will be present.   SW met with pt and [name] in room to provide updates from team conference, d/c date 2/16, d/c recs- 3in1 BSC and outpatient PT/OT/SLP. SW will order DME, and preferred outpatient [facility].  SW reviewed discharge process.  SW ordered 3in1 BSC with [employer] via parachute.   Loralee Pacas, MSW, [place] Office: [phone] Cell: [phone] Fax: [phone]

## 2022-08-24 NOTE — Progress Notes (Signed)
Physical Therapy Session Note  Patient Details  Name: Carlos Shields MRN: [id number] Date of Birth: January 27, 1931  Today's Date: 08/24/2022 PT Individual Time: 1st Treatment Session: 1000-1030; 2nd Treatment Session: 1445-1540 PT Individual Time Calculation (min): 30 min; 55 min  Short Term Goals: Week 2:  PT Short Term Goal 1 (Week 2): STGs=LTGs secondary to ELOS  Skilled Therapeutic Interventions/Progress Updates:  1st Treatment Session- Patient greeted sitting upright in wheelchair in room and agreeable to PT treatment session. Patient wheeled to rehab gym for time management and energy conservation.   Patient stood from wheelchair with RW and MinA to initiate anterior weight shift. Once standing, patient ambulated to the car simulator with RW and SBA. Patient required VC for improved sequencing with car transfer and proper hand placement. Once sitting, patient was able to place B LE into/out of the car. Patient stood from car with SBA and then ambulated up/down low grade ramp with RW and SBA- VC for increased B step length and stepping within the frame of the walker. Patient was then wheeled to the rehab gym where he ascended/descended 4 steps with B HR and CGA for safety- VC for placing entire foot on step in order to improve overall safety and stability. Patient ascended/descended 4" curb step with RW and CGA- Therapist providing VC and demonstration with good overall stability and ability to perform for safe entry into home. Patient ambulated >200' from main gym to his room with RW and Supv. Patient continues to require MAX verbal cues throughout treatment session with all functional mobility secondary to poor carryover. Patient left sitting upright in wheelchair with posey belt on, call bell within reach and all needs met.    2nd Treatment Session- Patient greeted sitting upright in wheelchair in room and with daughter and friends present and agreeable to PT treatment session- Hands-on family  training completed in order to ensure a safe discharge home. Patient wheeled to ortho gym for time management and energy conservation.   Patient performed various sit/stands with CGA/SBA with MAX VC for increased anterior weight shift, proper hand placement, scooting forward and tucking B feet underneath him for improved safety and independence with task.   Patient performed a car transfer, gait x200' and x75', ascended/descended a 4" curb step all with RW and Supv/CGA for safety. With all functional mobility tasks patient required MAX VC for proper sequencing due to poor/absent carryover. Patient's daughter provided appropriate VC with only minimal assistance from therapist for assistance. Therapist spent time educating patient and daughter regarding supv with all functional mobility tasks and ways to ensure safety.   Patient returned to his room sitting upright in wheelchair with posey belt on, call bell within reach and all needs met.    Therapy Documentation Precautions:  Precautions Precautions: Fall, Other (comment) Precaution Comments: incontinent, R hemi, seizure Restrictions Weight Bearing Restrictions: No  Therapy/Group: Individual Therapy  Carlos Shields 08/24/2022, 7:52 AM

## 2022-08-24 NOTE — Patient Care Conference (Signed)
Inpatient RehabilitationTeam Conference and Plan of Care Update Date: 08/24/2022   Time: 10:41 AM    Patient Name: Carlos Shields      Medical Record Number: [id number]  Date of Birth: [birth year] Sex: Male         Room/Bed: 4W12C/4W12C-02 Payor Info: Payor: [employer] MEDICARE / Plan: [facility] MEDICARE / Product Type: *No Product type* /    Admit Date/Time:  08/14/2022  2:58 PM  Primary Diagnosis:  CVA (cerebrovascular accident) [facility])  Hospital Problems: Principal Problem:   CVA (cerebrovascular accident) [facility])    Expected Discharge Date: Expected Discharge Date: 08/27/22  Team Members Present: Physician leading conference: Dr. [name] Social Worker Present: Loralee Pacas, [place] Nurse Present: Tacy Learn, RN PT Present: Terence Lux, PT OT Present: Cherylynn Ridges, OT SLP Present: Weston Anna, SLP PPS Coordinator present : Gunnar Fusi, SLP     Current Status/Progress Goal Weekly Team Focus  Bowel/Bladder   continent of b/b with episodes of incontence; LBM: 2/12   gain continence of bladder   assist with toileting needs prn    Swallow/Nutrition/ Hydration   Dys. 1 textures with thin liquids, Supervision   Supervision  Family education    ADL's   CGA/min A   supervision   self-care retraining, balance, pt/family education, dc planning    Mobility   Supv for bed mobility, CGA/SBA for sit/stands with RW, SBA/Supv for gait >150' and transfers with RW, MinA for stair mobility   Supv/CGA  NMR, dynamic stability, sit/stand, transfers, gait, education, discharge planning, etc.    Communication                Safety/Cognition/ Behavioral Observations  Min-Mod A   Min A   Family Education    Pain   no c/o pain   remain pain free   assess pain level QS and prn    Skin   neck and groin incision OTA with skin glue   remain free of new skin breakdown/infection  assess skin QS and prn      Discharge Planning:  D/c to home with his dtr  [name] who will provide 24/7 care. She is not comfortable with self-care. SHe is hopeful her dad will be able to do on his own. Fam edu scheduled for Tuesday (2/13) 1pm-4pm. SW will confirm there are no barriers to discharge.   Team Discussion: CVA. Continent B/B with time voiding during day with incontinence at HS. Denies pain. Skin CDI. Poor PO intake. Family education today.  Patient on target to meet rehab goals: yes, BADLs CGA/MinA. Supervision for bed mobility. CGA/SBA for STS with RW. SBA/Supv. For gait greater than 150' and transfers with RW. MinA for stair mobility.   *See Care Plan and progress notes for long and short-term goals.   Revisions to Treatment Plan:  Monitor labs  Teaching Needs: Medications, safety, self care, gait/transfer training, skin care, etc.   Current Barriers to Discharge: Decreased caregiver support, Home enviroment access/layout, and Incontinence  Possible Resolutions to Barriers: Family education, nursing education, order recommended DME     Medical Summary Current Status: medically complicated by dysphagia with poor fluid intakes, AKI on CKD, cognitive deficits, and hypertension  Barriers to Discharge: Electrolyte abnormality;Inadequate Nutritional Intake;Medical stability;Renal Insufficiency/Failure;Self-care education  Barriers to Discharge Comments: AKI with poor PO intakes, poor cognition/retention Possible Resolutions to [employer] Focus: encouraging PO fluids to improve renal function and electrolytes, BP, medication adjustment and education   Continued Need for Acute Rehabilitation Level of Care: The patient  requires daily medical management by a physician with specialized training in physical medicine and rehabilitation for the following reasons: Direction of a multidisciplinary physical rehabilitation program to maximize functional independence : Yes Medical management of patient stability for increased activity during participation in an  intensive rehabilitation regime.: Yes Analysis of laboratory values and/or radiology reports with any subsequent need for medication adjustment and/or medical intervention. : Yes   I attest that I was present, lead the team conference, and concur with the assessment and plan of the team.   Ernest Pine 08/24/2022, 2:16 PM

## 2022-08-24 NOTE — Plan of Care (Signed)
  Problem: RH Eating Goal: LTG Patient will perform eating w/assist, cues/equip (OT) Description: LTG: Patient will perform eating with assist, with/without cues using equipment (OT) Flowsheets (Taken 08/24/2022 1055) LTG: Pt will perform eating with assistance level of: Supervision/Verbal cueing Note: Goal downgraded 2/13-ESD

## 2022-08-24 NOTE — Discharge Summary (Signed)
Physician Discharge Summary  Patient ID: Carlos Shields MRN: [id number] DOB/AGE: June 22, 1931 89 y.o.  Admit date: 08/14/2022 Discharge date: 08/27/2022  Discharge Diagnoses:  Principal Problem:   CVA (cerebrovascular accident) [facility]) DVT prophylaxis Dysphagia Left internal carotid artery stenosis Seizures Hypertension Acute on chronic CKD stage III Chronic normocytic anemia Pancreatitis Venous insufficiency Abnormal TSH  Discharged Condition: Stable  Significant Diagnostic Studies: DG Swallowing Func-Speech Pathology  Result Date: 08/19/2022 Table formatting from the original result was not included. Modified Barium Swallow Study Patient Details Name: Carlos Shields MRN: [id number] Date of Birth: 01-18-1931 Today's Date: 08/19/2022 HPI/PMH: HPI: Patient is an 89 y.o. male with a PMHx of seizure, CVA, hypertension, CKD stage III, pancreatitis, venous insufficiency, and dysphagia who was admitted on 08/05/22 for right-sided weakness, incontinence, seizure like activity, and fall. MRI brain 08/05/22 which revealed multiple small acute infarcts involving the L basal ganglia, L frontal, parietal, and occipital cortex, and adjacent white matter with slight edema but no mass effect. He was admitted and underwent MRA head on 08/06/22 which showed intracranial atherosclerotic disease with associated moderate stenosis at the L carotid siphon with additional severe distal L P3 stenosis. Vascular surgery was consulted, he underwent CTA neck on 08/08/22 which showed proximal L ICA stenosis 75% and 65% on right, and he subsequently underwent L transcarotid artery revascularization on 08/12/22 with Dr. Donzetta Matters. He remained stable during his hospitalization, with improving kidney function back to baseline. CIR was recommended due to functional decline and patient admitted 08/14/22. Patient with previous OP MBS on 07/22/22 due to reports of coughing with solid textures. Patient with mild impairments and recommended to continue regular  textures with thin liquids. Patient with s/s of aspiration at bedside and downgraded to Dys. 2 textures with continuation of thin liquids. MBS today to assess swallow function. Clinical Impression: Clinical Impression: Patient presents with a mild oropharyngeal dysphagia c/b mild deficits in bolus prep/mastication and bolus transport with swallows initiated consistently at the valleculae. Patient's pharyngeal phase is c/b reduced anterior hyoid excursion, decreased tongue base retraction and diminished stripping wave resulting in mild residue.  No penetration or aspiration observed with exception of 1 event of flash penetration with thin liquids via straw (PAS 2).  Although patient's swallow appeared grossly functional for consistencies tested, will f/u for potential impact on more solid consistencies given clinician presentation at the bedside. Recommend patient continue current diet of Dys. 2 textures with thin liquids with full supervision to maximize safety with overall PO intake. Factors that may increase risk of adverse event in presence of aspiration ([place] 2021): Factors that may increase risk of adverse event in presence of aspiration ([place] 2021): Reduced cognitive function; Frail or deconditioned Recommendations/Plan: Swallowing Evaluation Recommendations Swallowing Evaluation Recommendations Recommendations: PO diet PO Diet Recommendation: Dysphagia 2 (Finely chopped); Thin liquids (Level 0) Liquid Administration via: Cup Medication Administration: Crushed with puree Supervision: Patient able to self-feed; Staff to assist with self-feeding; Full supervision/cueing for swallowing strategies Swallowing strategies  : Minimize environmental distractions; Slow rate; Small bites/sips; Follow solids with liquids Postural changes: Stay upright 30-60 min after meals; Position pt fully upright for meals Oral care recommendations: Oral care BID (2x/day) Treatment Plan Treatment Plan Treatment  recommendations: Therapy as outlined in treatment plan below Treatment frequency: Min 3x/week Treatment duration: 2 weeks Interventions: Aspiration precaution training; Compensatory techniques; Patient/family education; Trials of upgraded texture/liquids; Diet toleration management by SLP Recommendations Recommendations for follow up therapy are one component of a multi-disciplinary discharge planning process, led by  the attending physician.  Recommendations may be updated based on patient status, additional functional criteria and insurance authorization. Assessment: Orofacial Exam: Orofacial Exam Oral Cavity: Oral Hygiene: WFL Oral Cavity - Dentition: Poor condition; Missing dentition Orofacial Anatomy: WFL Oral Motor/Sensory Function: Suspected cranial nerve impairment CN V - Trigeminal: WFL CN VII - Facial: WFL CN IX - Glossopharyngeal, CN X - Vagus: WFL CN XII - Hypoglossal: Right motor impairment Anatomy: Anatomy: WFL Thin Liquids: Thin Liquids (Level 0) Thin Liquids : Impaired Bolus delivery method: Spoon; Cup; Straw Thin Liquid - Impairment: Oral Impairment; Pharyngeal impairment Tongue control during bolus hold: Posterior escape of greater than half of bolus Bolus transport/lingual motion: Brisk tongue motion Oral residue: Trace residue lining oral structures Location of oral residue : Tongue Initiation of swallow : Valleculae Soft palate elevation: Complete Laryngeal elevation: Complete superior movement of thyroid cartilage with complete approximation of arytenoids to epiglottic petiole Anterior hyoid excursion: Partial Epiglottic movement: Complete Laryngeal vestibule closure: Complete: No air/contrast in laryngeal vestibule Pharyngeal stripping wave : Present - complete Pharyngeal contraction (A/P view only): N/A Pharyngoesophageal segment opening: Complete distension and complete duration, no obstruction of flow Tongue base retraction: Narrow column of contrast or air between tongue base and PPW  Pharyngeal residue: Collection of residue within or on pharyngeal structures Location of pharyngeal residue: Pyriform sinuses; Valleculae Penetration/Aspiration Scale (PAS) score: 1.  Material does not enter airway  Mildly Thick Liquids: Mildly thick liquids (Level 2, nectar thick) Mildly thick liquids (Level 2, nectar thick): Impaired Bolus delivery method: Spoon; Cup Mildly Thick Liquid - Impairment: Oral Impairment; Pharyngeal impairment Lip Closure: Escape from interlabial space or lateral juncture, no extension beyond vermillion border Tongue control during bolus hold: Posterior escape of greater than half of bolus Bolus transport/lingual motion: Brisk tongue motion Oral residue: Residue collection on oral structures Location of oral residue : Tongue; Palate Initiation of swallow : Posterior laryngeal surface of the epiglottis Soft palate elevation: Complete Laryngeal elevation: Complete superior movement of thyroid cartilage with complete approximation of arytenoids to epiglottic petiole Anterior hyoid excursion: Partial Epiglottic movement: Complete Laryngeal vestibule closure: Complete: No air/contrast in laryngeal vestibule Pharyngeal stripping wave : Present - diminished Pharyngeal contraction (A/P view only): N/A Pharyngoesophageal segment opening: Complete distension and complete duration, no obstruction of flow Tongue base retraction: Wide column of contrast or air between tongue base and PPW Pharyngeal residue: Collection of residue within or on pharyngeal structures Location of pharyngeal residue: Valleculae; Pharyngeal wall; Aryepiglottic folds Penetration/Aspiration Scale (PAS) score: 1.  Material does not enter airway  Moderately Thick Liquids: Moderately thick liquids (Level 3, honey thick) Moderately thick liquids (Level 3, honey thick): Impaired Bolus delivery method: Spoon Moderately Thick Liquid - Impairment: Oral Impairment; Pharyngeal impairment Lip Closure: No labial escape Tongue control  during bolus hold: Posterior escape of greater than half of bolus Bolus transport/lingual motion: Brisk tongue motion Oral residue: Residue collection on oral structures Location of oral residue : Tongue; Palate Initiation of swallow : Valleculae Soft palate elevation: Complete Laryngeal elevation: Complete superior movement of thyroid cartilage with complete approximation of arytenoids to epiglottic petiole Anterior hyoid excursion: Partial Epiglottic movement: Complete Laryngeal vestibule closure: Complete: No air/contrast in laryngeal vestibule Pharyngeal stripping wave : Present - diminished Pharyngeal contraction (A/P view only): N/A Pharyngoesophageal segment opening: Complete distension and complete duration, no obstruction of flow Tongue base retraction: Wide column of contrast or air between tongue base and PPW Pharyngeal residue: Collection of residue within or on pharyngeal structures Location of pharyngeal residue:  Valleculae; Tongue base Penetration/Aspiration Scale (PAS) score: 1.  Material does not enter airway  Puree: Puree Puree: Impaired Puree - Impairment: Oral Impairment; Pharyngeal impairment Lip Closure: No labial escape Bolus transport/lingual motion: Slow tongue motion Oral residue: Trace residue lining oral structures Location of oral residue : Tongue Initiation of swallow: Valleculae Soft palate elevation: Complete Laryngeal elevation: Complete superior movement of thyroid cartilage with complete approximation of arytenoids to epiglottic petiole Anterior hyoid excursion: Partial Epiglottic movement: Complete Laryngeal vestibule closure: Complete: No air/contrast in laryngeal vestibule Pharyngeal stripping wave : Present - diminished Pharyngeal contraction (A/P view only): N/A Pharyngoesophageal segment opening: Complete distension and complete duration, no obstruction of flow Tongue base retraction: Wide column of contrast or air between tongue base and PPW Pharyngeal residue: Collection of  residue within or on pharyngeal structures Location of pharyngeal residue: Valleculae; Pharyngeal wall Penetration/Aspiration Scale (PAS) score: 1.  Material does not enter airway Solid: Solid Solid: Impaired Solid - Impairment: Oral Impairment; Pharyngeal impairment Lip Closure: No labial escape Bolus preparation/mastication: Disorganized chewing/mashing with solid pieces of bolus unchewed Bolus transport/lingual motion: Repetitive/disorganized tongue motion Oral residue: Residue collection on oral structures Location of oral residue : Tongue Initiation of swallow: Valleculae Soft palate elevation: Complete Laryngeal elevation: Complete superior movement of thyroid cartilage with complete approximation of arytenoids to epiglottic petiole Anterior hyoid excursion: Partial Epiglottic movement: Complete Laryngeal vestibule closure: Complete: No air/contrast in laryngeal vestibule Pharyngeal stripping wave : Present - diminished Pharyngeal contraction (A/P view only): N/A Pharyngoesophageal segment opening: Complete distension and complete duration, no obstruction of flow Tongue base retraction: Narrow column of contrast or air between tongue base and PPW Pharyngeal residue: Collection of residue within or on pharyngeal structures Location of pharyngeal residue: Valleculae Penetration/Aspiration Scale (PAS) score: 1.  Material does not enter airway Pill: Pill Pill: Not Tested Compensatory Strategies: Compensatory Strategies Compensatory strategies: Yes Straw: Ineffective Effective Straw: Thin liquid (Level 0)   General Information: Caregiver present: No  Diet Prior to this Study: Dysphagia 2 (finely chopped); Thin liquids (Level 0)   Temperature : Normal   Respiratory Status: WFL   Supplemental O2: None (Room air)   History of Recent Intubation: No  Behavior/Cognition: Alert; Cooperative; Pleasant mood Self-Feeding Abilities: Able to self-feed; Needs set-up for self-feeding Baseline vocal quality/speech: Dysphonic  Volitional Cough: Able to elicit Volitional Swallow: Able to elicit Exam Limitations: No limitations Goal Planning: Prognosis for improved oropharyngeal function: Fair Barriers to Reach Goals: Cognitive deficits No data recorded Patient/Family Stated Goal: pt reports problems swallowing food - sensing they stick in his throat - on the left Consulted and agree with results and recommendations: Patient Pain: Pain Assessment Pain Assessment: Faces Faces Pain Scale: 0 Pain Intervention(s): Monitored during session End of Session: Start Time:SLP Start Time (ACUTE ONLY): 1300 Stop Time: SLP Stop Time (ACUTE ONLY): 1316 Time Calculation:SLP Time Calculation (min) (ACUTE ONLY): 16 min Charges: SLP Evaluations $ SLP Speech Visit: 1 Visit SLP Evaluations $Outpatient MBS Swallow: 1 Procedure $ SLP EVAL LANGUAGE/SOUND PRODUCTION: 1 Procedure $Swallowing Treatment: 1 Procedure $Cognitive Funtion inital: Initial 15 mins SLP visit diagnosis: SLP Visit Diagnosis: Dysphagia, oropharyngeal phase (R13.12) Past Medical History: Past Medical History: Diagnosis Date  CKD (chronic kidney disease) stage 3, GFR 30-59 ml/min (HCC)   Gait abnormality   Hypertension   Pancreatitis   Seizures (HCC)   Venous insufficiency  Past Surgical History: Past Surgical History: Procedure Laterality Date  HERNIA REPAIR    TRANSCAROTID ARTERY REVASCULARIZATION  Left 08/12/2022  Procedure: Transcarotid Artery Revascularization;  Surgeon: Waynetta Sandy, MD;  Location: [place];  Service: Vascular;  Laterality: Left; [place], [place] 08/19/2022, 4:47 PM  Structural Heart Procedure  Result Date: 08/12/2022 See surgical note for result.  VAS US CAROTID (at [facility] and [facility] only)  Result Date: 08/09/2022 Carotid Arterial Duplex Study Patient Name:  Carlos Shields  Date of Exam:   08/06/2022 Medical Rec #: [id number]  Accession #:    [id number] Date of Birth: [birth year]  Patient Gender: M Patient Age:   72 years Exam Location:  [facility] Procedure:       VAS US CAROTID Referring Phys: CHING TU --------------------------------------------------------------------------------  Indications:   CVA. Risk Factors:  Hypertension. Other Factors: Seizures. Performing Technologist: Darlin Coco RDMS, RVT  Examination Guidelines: A complete evaluation includes B-mode imaging, spectral Doppler, color Doppler, and power Doppler as needed of all accessible portions of each vessel. Bilateral testing is considered an integral part of a complete examination. Limited examinations for reoccurring indications may be performed as noted.  Right Carotid Findings: +----------+--------+--------+--------+---------------------------+--------+           PSV cm/sEDV cm/sStenosisPlaque Description         Comments +----------+--------+--------+--------+---------------------------+--------+ CCA Prox  57      15                                                  +----------+--------+--------+--------+---------------------------+--------+ CCA Distal49      13                                                  +----------+--------+--------+--------+---------------------------+--------+ ICA Prox  226     74      60-79%  hypoechoic and heterogenous         +----------+--------+--------+--------+---------------------------+--------+ ICA Mid   166     49                                         tortuous +----------+--------+--------+--------+---------------------------+--------+ ICA Distal64      20                                                  +----------+--------+--------+--------+---------------------------+--------+ ECA       37                                                          +----------+--------+--------+--------+---------------------------+--------+ +----------+--------+-------+---------+-------------------+           PSV cm/sEDV cmsDescribe Arm Pressure (mmHG) +----------+--------+-------+---------+-------------------+ [id number]              Turbulent                    +----------+--------+-------+---------+-------------------+ +---------+--------+--+--------+--+---------+ VertebralPSV cm/s48EDV cm/s15Antegrade +---------+--------+--+--------+--+---------+  Left Carotid Findings: +---------+--------+-------+--------+------------------------+-----------------+          PSV cm/sEDV  StenosisPlaque Description      Comments                           cm/s                                                     +---------+--------+-------+--------+------------------------+-----------------+ CCA Prox 71      9                                      intimal                                                                   thickening        +---------+--------+-------+--------+------------------------+-----------------+ CCA      41      7                                                        Distal                                                                    +---------+--------+-------+--------+------------------------+-----------------+ ICA Prox 255     75     60-79%  heterogenous and                                                          irregular                                 +---------+--------+-------+--------+------------------------+-----------------+ ICA Mid  107     22                                     tortuous          +---------+--------+-------+--------+------------------------+-----------------+ ICA      80      20                                                       Distal                                                                    +---------+--------+-------+--------+------------------------+-----------------+  ECA      37                                                               +---------+--------+-------+--------+------------------------+-----------------+ +----------+--------+--------+---------+-------------------+            PSV cm/sEDV cm/sDescribe Arm Pressure (mmHG) +----------+--------+--------+---------+-------------------+ Subclavian221             Turbulent                    +----------+--------+--------+---------+-------------------+ +---------+--------+--+--------+--+---------+ VertebralPSV cm/s44EDV cm/s12Antegrade +---------+--------+--+--------+--+---------+   Summary: Right Carotid: Velocities in the right ICA are consistent with a 60-79%                stenosis. Left Carotid: Velocities in the left ICA are consistent with a 60-79% stenosis. Vertebrals:  Bilateral vertebral arteries demonstrate antegrade flow. Subclavians: Bilateral subclavian artery flow was disturbed. *See table(s) above for measurements and observations.  Electronically signed by Antony Contras MD on 08/09/2022 at 2:14:14 PM.    Final    CT ANGIO NECK W OR WO CONTRAST  Result Date: 08/08/2022 CLINICAL DATA:  Follow-up examination for stroke. EXAM: CT ANGIOGRAPHY NECK TECHNIQUE: Multidetector CT imaging of the neck was performed using the standard protocol during bolus administration of intravenous contrast. Multiplanar CT image reconstructions and MIPs were obtained to evaluate the vascular anatomy. Carotid stenosis measurements (when applicable) are obtained utilizing NASCET criteria, using the distal internal carotid diameter as the denominator. RADIATION DOSE REDUCTION: This exam was performed according to the departmental dose-optimization program which includes automated exposure control, adjustment of the mA and/or kV according to patient size and/or use of iterative reconstruction technique. CONTRAST:  38m OMNIPAQUE IOHEXOL 350 MG/ML SOLN COMPARISON:  Prior MRI from 08/05/2022. FINDINGS: Aortic arch: Visualized aortic arch normal caliber with standard branch pattern. Atheromatous change about the arch itself without high-grade stenosis. Right carotid system: Right common and internal carotid arteries are patent without  dissection. Moderate to advanced atheromatous change about the right carotid bulb/proximal right ICA with associated stenosis of up to 65% by NASCET criteria. Calcified atherosclerosis noted about the right carotid siphon. Left carotid system: Left common and internal carotid arteries are patent without dissection. Moderate to advanced atheromatous change about the left carotid bulb/proximal left ICA with associated stenosis of up to 75% by NASCET criteria. Calcified atherosclerosis noted about the left carotid siphon. Vertebral arteries: Both vertebral arteries arise from subclavian arteries. Proximal subclavian atherosclerosis with associated up to 50% stenosis on the left (series 7, image 175). Vertebral arteries M cells are patent without dissection or hemodynamically significant stenosis. Skeleton: No discrete or worrisome osseous lesions. Poor dentition noted. Other neck: No other acute soft tissue abnormality about the neck. Upper chest: Visualized upper chest demonstrates no acute finding. IMPRESSION: 1. Moderate to advanced atheromatous change about the carotid bifurcations/proximal ICAs with associated stenoses of up to 65% on the right and 75% on the left. 2. 50% proximal left subclavian artery stenosis. 3. Wide patency of both vertebral arteries within the neck. Aortic Atherosclerosis (ICD10-I70.0). Electronically Signed   By: BJeannine BogaM.D.   On: 08/08/2022 22:09   ECHOCARDIOGRAM COMPLETE  Result Date: 08/06/2022    ECHOCARDIOGRAM REPORT   Patient Name:   Carlos Shields Date of Exam: 08/06/2022 Medical Rec #:  [id number]  Height:       64.0 in Accession #:    [id number] Weight:       114.0 lb Date of Birth:  1931/01/08  BSA:          1.541 m Patient Age:    17 years   BP:           129/72 mmHg Patient Gender: M          HR:           69 bpm. Exam Location:  Inpatient Procedure: 2D Echo, Cardiac Doppler and Color Doppler Indications:    Stroke I63.9  History:        Patient has no prior history  of Echocardiogram examinations.                 Stroke; Signs/Symptoms:Syncope. CKD, stage III.  Sonographer:    Ronny Flurry Referring Phys: [id number] Turner T TU IMPRESSIONS  1. Left ventricular ejection fraction, by estimation, is 60 to 65%. The left ventricle has normal function. The left ventricle has no regional wall motion abnormalities. Left ventricular diastolic parameters are consistent with Grade I diastolic dysfunction (impaired relaxation).  2. Right ventricular systolic function is normal. The right ventricular size is normal.  3. The mitral valve is abnormal. Trivial mitral valve regurgitation. No evidence of mitral stenosis.  4. The aortic valve is tricuspid. There is mild calcification of the aortic valve. There is mild thickening of the aortic valve. Aortic valve regurgitation is not visualized. Aortic valve sclerosis is present, with no evidence of aortic valve stenosis.  5. The inferior vena cava is normal in size with greater than 50% respiratory variability, suggesting right atrial pressure of 3 mmHg. FINDINGS  Left Ventricle: Left ventricular ejection fraction, by estimation, is 60 to 65%. The left ventricle has normal function. The left ventricle has no regional wall motion abnormalities. The left ventricular internal cavity size was normal in size. There is  no left ventricular hypertrophy. Left ventricular diastolic parameters are consistent with Grade I diastolic dysfunction (impaired relaxation). Right Ventricle: The right ventricular size is normal. No increase in right ventricular wall thickness. Right ventricular systolic function is normal. Left Atrium: Left atrial size was normal in size. Right Atrium: Right atrial size was normal in size. Pericardium: There is no evidence of pericardial effusion. Mitral Valve: The mitral valve is abnormal. There is mild thickening of the mitral valve leaflet(s). There is mild calcification of the mitral valve leaflet(s). Trivial mitral valve  regurgitation. No evidence of mitral valve stenosis. Tricuspid Valve: The tricuspid valve is normal in structure. Tricuspid valve regurgitation is not demonstrated. No evidence of tricuspid stenosis. Aortic Valve: The aortic valve is tricuspid. There is mild calcification of the aortic valve. There is mild thickening of the aortic valve. Aortic valve regurgitation is not visualized. Aortic valve sclerosis is present, with no evidence of aortic valve stenosis. Aortic valve mean gradient measures 3.0 mmHg. Aortic valve peak gradient measures 6.7 mmHg. Aortic valve area, by VTI measures 2.30 cm. Pulmonic Valve: The pulmonic valve was normal in structure. Pulmonic valve regurgitation is not visualized. No evidence of pulmonic stenosis. Aorta: The aortic root is normal in size and structure. Venous: The inferior vena cava is normal in size with greater than 50% respiratory variability, suggesting right atrial pressure of 3 mmHg. IAS/Shunts: No atrial level shunt detected by color flow Doppler.  LEFT VENTRICLE PLAX 2D LVOT diam:     2.20 cm   Diastology LV  SV:         89        LV e' medial:    6.42 cm/s LV SV Index:   38        LV E/e' medial:  9.8 LVOT Area:     3.80 cm  LV e' lateral:   7.40 cm/s                          LV E/e' lateral: 8.5  RIGHT VENTRICLE            IVC RV S prime:     7.46 cm/s  IVC diam: 1.10 cm TAPSE (M-mode): 1.5 cm LEFT ATRIUM             Index        RIGHT ATRIUM          Index LA Vol (A2C):   24.0 ml 15.58 ml/m  RA Area:     7.28 cm LA Vol (A4C):   16.9 ml 10.97 ml/m  RA Volume:   11.40 ml 7.40 ml/m LA Biplane Vol: 20.9 ml 13.57 ml/m  AORTIC VALVE AV Area (Vmax):    2.36 cm AV Area (Vmean):   2.36 cm AV Area (VTI):     2.30 cm AV Vmax:           129.00 cm/s AV Vmean:          86.700 cm/s AV VTI:            0.258 m AV Peak Grad:      6.7 mmHg AV Mean Grad:      3.0 mmHg LVOT Vmax:         79.95 cm/s LVOT Vmean:        53.800 cm/s LVOT VTI:          0.156 m LVOT/AV VTI ratio: 0.60   AORTA Ao Root diam: 3.30 cm Ao Asc diam:  3.10 cm MITRAL VALVE MV Area (PHT): 1.98 cm    SHUNTS MV Decel Time: 384 msec    Systemic VTI:  0.16 m MV E velocity: 62.60 cm/s  Systemic Diam: 2.20 cm MV A velocity: 89.10 cm/s MV E/A ratio:  0.70 Jenkins Rouge MD Electronically signed by Jenkins Rouge MD Signature Date/Time: 08/06/2022/12:37:27 PM    Final    US RENAL  Result Date: 08/06/2022 CLINICAL DATA:  Acute kidney injury EXAM: RENAL / URINARY TRACT ULTRASOUND COMPLETE COMPARISON:  CT abdomen 07/02/2022 FINDINGS: Right Kidney: Renal measurements: 10.4 by 6.0 by 4.6 cm = volume: 150 mL. Echogenic parenchyma compatible with chronic medical renal disease. No solid mass or hydronephrosis visualized. Small right kidney lower pole cyst seen on image 13 series 1. Left Kidney: Renal measurements: 10.2 by 5.3 by 4.7 cm = volume: 134 mL. Echogenic parenchyma compatible with chronic medical renal disease. No mass or hydronephrosis visualized. The 4 mm left kidney upper pole calculus shown on the CT from 07/02/2022 is well seen today. Bladder: Appears normal for degree of bladder distention. Other: None. IMPRESSION: 1. Bilateral echogenic renal parenchyma compatible with chronic medical renal disease. 2. No hydronephrosis. 3. The 4 mm left renal calculus shown on recent CT is not readily apparent on today's ultrasound exam. It may have passed or simply be occult sonographically. The technologist notes suboptimal images due to patient breathing and overlying bowel gas. Electronically Signed   By: Van Clines M.D.   On: 08/06/2022 12:22   MR ANGIO HEAD  WO CONTRAST  Result Date: 08/06/2022 CLINICAL DATA:  Follow-up examination for stroke. EXAM: MRA HEAD WITHOUT CONTRAST TECHNIQUE: Angiographic images of the Circle of Willis were acquired using MRA technique without intravenous contrast. COMPARISON:  Prior brain MRI from earlier the same day. FINDINGS: Anterior circulation: Visualized distal cervical segments of the  internal carotid arteries are patent with antegrade flow. Atheromatous irregularity within the carotid siphons with associated moderate stenosis at the anterior genu of the left carotid siphon (series 10003, image 7). A1 segments, anterior communicating complex common anterior cerebral arteries patent bilaterally. Both M1 segments patent. Left M1 bifurcates early. No proximal MCA branch occlusion or high-grade stenosis. Distal MCA branches perfused and symmetric. Posterior circulation: Atheromatous irregularity within the left V4 segment without high-grade stenosis. Right vertebral artery dominant and widely patent. Right vertebral artery dominant. Both PICA patent. Basilar widely patent without stenosis. Superior cerebellar arteries patent bilaterally. Both PCAs primarily supplied via the basilar, although a prominent left posterior is noted. Right PCA patent to its distal aspect without stenosis. Probable focal severe distal left P3 stenosis noted (series 1028, image 27). Atheromatous irregularity within the left P2 segment without high-grade stenosis. Anatomic variants: None significant. Other: No intracranial aneurysm. IMPRESSION: 1. Negative intracranial MRA for large vessel occlusion. 2. Intracranial atherosclerotic disease with associated moderate stenosis at the left carotid siphon, with additional severe distal left P3 stenosis. No other proximal high-grade or correctable stenosis Electronically Signed   By: Jeannine Boga M.D.   On: 08/06/2022 00:02   MR BRAIN WO CONTRAST  Result Date: 08/05/2022 CLINICAL DATA:  Neuro deficit, acute, stroke suspected EXAM: MRI HEAD WITHOUT CONTRAST TECHNIQUE: Multiplanar, multiecho pulse sequences of the brain and surrounding structures were obtained without intravenous contrast. COMPARISON:  Same day CT head. FINDINGS: Brain: Multiple small acute infarcts involving the left basal ganglia, left frontal, parietal and occipital cortex and adjacent white matter.  Slight edema without mass effect. Additional scattered T2/FLAIR hyperintensities within the white matter, compatible with chronic microvascular ischemic disease. No evidence of acute hemorrhage, mass lesion, midline shift or fall hydrocephalus. Cerebral atrophy. Vascular: Major arterial flow voids are maintained. Skull and upper cervical spine: Normal marrow signal. Sinuses/Orbits: Clear sinuses.  No acute orbital findings. Other: No mastoid effusions. IMPRESSION: 1. Multiple small acute infarcts involving the left basal ganglia, left frontal, parietal and occipital cortex and adjacent white matter. Slight edema without mass effect. 2. Advanced brain atrophy (ICD10-G31.9). and chronic microvascular ischemic disease. Electronically Signed   By: Margaretha Sheffield M.D.   On: 08/05/2022 18:42   CT HEAD CODE STROKE WO CONTRAST  Result Date: 08/05/2022 CLINICAL DATA:  Code stroke.  Neuro deficit, acute, stroke suspected EXAM: CT HEAD WITHOUT CONTRAST TECHNIQUE: Contiguous axial images were obtained from the base of the skull through the vertex without intravenous contrast. RADIATION DOSE REDUCTION: This exam was performed according to the departmental dose-optimization program which includes automated exposure control, adjustment of the mA and/or kV according to patient size and/or use of iterative reconstruction technique. COMPARISON:  Apr 25, 2021 FINDINGS: Brain: Patchy white matter hypodensities, nonspecific but compatible with chronic microvascular ischemic disease. Similar chronic lacunar infarcts involving the basal ganglia and thalami. No evidence of acute large vascular territory infarct, acute hemorrhage, mass lesion or midline shift. Vascular: Calcific atherosclerosis. No hyperdense vessel identified. Skull: No acute fracture. Sinuses/Orbits: Clear sinuses.  No acute orbital findings. Other: No mastoid effusions. ASPECTS [facility] Stroke Program Early CT Score) total score (0-10 with 10 being normal): 10.  IMPRESSION: 1. No  evidence of acute abnormality. ASPECTS is 10. 2. Similar chronic microvascular ischemic disease. Findings discussed with Dr. Pearline Cables via telephone at 2:44 PM. Electronically Signed   By: Margaretha Sheffield M.D.   On: 08/05/2022 14:45    Labs:  Basic Metabolic Panel: Recent Labs  Lab 08/20/22 1243 08/23/22 0908 08/24/22 0546 08/26/22 0654  NA 137 141 137 136  K 4.3 4.4 4.3 4.4  CL 103 107 105 105  CO2 22 25 26 25  $ GLUCOSE 100* 99 97 92  BUN 44* 43* 47* 42*  CREATININE 1.69* 1.67* 1.61* 1.80*  CALCIUM 9.1 9.2 8.7* 8.8*    CBC: Recent Labs  Lab 08/23/22 0908 08/26/22 0654  WBC 6.7 7.0  HGB 9.2* 8.5*  HCT 29.5* 25.7*  MCV 91.9 89.2  PLT 273 252    CBG: No results for input(s): "GLUCAP" in the last 168 hours.  Family history.  Positive for hypertension diabetes.  Denies any colon cancer esophageal cancer or rectal cancer  Brief HPI:   Carlos Shields is a 89 y.o. right-handed male with history of seizure maintained on Keppra, hypertension, CKD stage III, pancreatitis, venous insufficiency and dysphagia who was admitted 08/05/2022 for right-sided weakness, incontinence, seizure-like activity and fall.  He had been on his Keppra and aspirin.  In the ED his workup was notable for creatinine 2.1 hemoglobin 9.5 TSH 4.922, hemoglobin A1c 5.3.  He underwent MRI of the brain 08/05/2022 which revealed multiple small acute infarcts involving the left basal ganglia, left frontal, parietal and occipital cortex and adjacent white matter with slight edema but no mass effect.  He was admitted and underwent MRI of the head 08/06/2022 which showed intracranial atherosclerotic disease with associated moderate stenosis at the left carotid siphon with additional severe distal L P3 stenosis.  Vascular surgery consulted he underwent CTA of the neck 08/08/2022 which showed proximal left ICA stenosis 75% and 65% on the right and subsequently underwent left transcarotid artery revascularization 08/12/2022  per Dr. Donzetta Matters.  Lipid panel done 08/06/2022 showing LDL 69 cholesterol 143 started on Crestor during hospitalization.  Echocardiogram preserved EF ejection fraction 60 to [id number] but LV diastolic parameters grade 1 diastolic dysfunction.  He resumed his aspirin as prior to admission and started on Plavix daily 08/08/2018 for which she would continue for 3 weeks then Plavix alone.  Therapy evaluations completed due to patient decreased functional mobility/right-sided weakness was admitted for a comprehensive rehab program.   Hospital Course: Carlos Shields was admitted to rehab 08/14/2022 for inpatient therapies to consist of PT, ST and OT at least three hours five days a week. Past admission physiatrist, therapy team and rehab RN have worked together to provide customized collaborative inpatient rehab.  Pertaining to patient's acute ischemic CVA infarcts involving left basal ganglia left frontal right occipital cortex and adjacent white matter remained stable he would follow-up with neurology services.  He remained on aspirin and Plavix for CVA prophylaxis through 08/29/2022 then Plavix alone.  Noted history of dysphagia he continued on dysphagia #1 thin liquid diet.  Keppra ongoing for history of seizure no seizure activity.  Lovenox for DVT prophylaxis no bleeding episodes.  Left internal carotid artery stenosis status post TCAR 08/12/2022 followed by Dr. Donzetta Matters.  Pain management use of oxycodone as needed.  Crestor ongoing for hyperlipidemia.  Blood pressure monitored permissive hypertension maintained on low-dose Lopressor and home Norvasc resumed for now and would need outpatient follow-up.  Acute on chronic CKD stage III with latest creatinine 1.67.  Hospital course findings  of elevated TSH 4.9 on admission Free T40.88 Within normal limits follow-up PCP.   Blood pressures were monitored on TID basis and controlled     Rehab course: During patient's stay in rehab weekly team conferences were held to monitor patient's  progress, set goals and discuss barriers to discharge. At admission, patient required min mod assist 115 feet rolling walker  Physical exam Blood pressure 129/66 pulse 57 temperature 98.2 respirations 18 oxygen saturation 99% room air Constitutional.  No acute distress HEENT Head.  Normocephalic and atraumatic Eyes.  Pupils round and reactive to light no discharge without nystagmus Neck.  Supple nontender no JVD without thyromegaly Cardiac regular rate and rhythm without any extra sounds or murmur heard Abdomen.  Soft nontender positive bowel sounds without rebound Respiratory effort normal no respiratory distress without wheeze Skin.TCAR site clean and dry Musculoskeletal.  No apparent deformity Strength 5/5 throughout left upper extremity left lower extremity, 4/5 throughout right upper right lower extremity Neurologic.  Oriented x 3 no dysarthria names 3/3 objects correctly  He/She  has had improvement in activity tolerance, balance, postural control as well as ability to compensate for deficits. He/She has had improvement in functional use RUE/LUE  and RLE/LLE as well as improvement in awareness.  Requires contact-guard assist for bed mobility contact-guard minimal assist for sit to stand standby assist for ambulation and minimal assist for stair mobility.  Ambulates to the bathroom with assistive device.  Contact-guard for standing needing some assist for lower body ADLs.  Speech therapy did follow-up for history of dysphagia and diet restrictions.  Full family teaching completed plan discharge to home       Disposition: Discharge to home    Diet: Dysphagia #1 thin liquids  Special Instructions: No driving smoking or alcohol  Follow-up with PCP for elevated TSH  Medications at discharge 1.  Tylenol as needed 2.  Aspirin 81 mg p.o. daily until 08/29/2022 and stop 3.  Plavix 75 mg p.o. daily 4.  Keppra 500 mg p.o. twice daily 5.  Namenda 5 mg p.o. twice daily 6.  Lopressor  12.5 mg p.o. twice daily 7.  Oxycodone 5 mg every 4 hours as needed pain 8.  Protonix 40 mg p.o. daily 9.  MiraLAX daily hold for loose stools 10.  Crestor 10 mg p.o. daily 11.  Norvasc 5 mg p.o. daily   30-35 minutes were spent completing discharge summary and discharge planning  Discharge Instructions     Ambulatory referral to Neurology   Complete by: As directed    An appointment is requested in approximately: 4 weeks left basal ganglia infarction   Ambulatory referral to Occupational Therapy   Complete by: As directed    Eval and treat   Ambulatory referral to Physical Medicine Rehab   Complete by: As directed    Moderate complexity follow-up 1 to 2 weeks ischemic/left basal ganglia CVA   Ambulatory referral to Physical Therapy   Complete by: As directed    Eval and treat   Ambulatory referral to Speech Therapy   Complete by: As directed    Eval and treat        Follow-up Information     [name] C, DO Follow up.   Specialty: Physical Medicine and Rehabilitation Why: Office to call for appointment Contact information: [address] [place] [address] [phone]         Waynetta Sandy, MD Follow up.   Specialties: Vascular Surgery, Cardiology Why: Call for appointment Contact  information: [address] [place] [address] [phone]                 Signed: Lavon Paganini Murray 08/27/2022, 5:06 AM

## 2022-08-24 NOTE — Progress Notes (Signed)
Speech Language Pathology Daily Session Note  Patient Details  Name: Carlos Shields MRN: [id number] Date of Birth: 11-Sep-1930  Today's Date: 08/24/2022 SLP Individual Time: 1330-1358 SLP Individual Time Calculation (min): 28 min  Short Term Goals: Week 2: SLP Short Term Goal 1 (Week 2): STGs=LTGs due to ELOS  Skilled Therapeutic Interventions: Skilled treatment session focused on completion of family education with the patient and his daughter. SLP facilitated session by providing education regarding patient's current swallowing function, diet recommendations, appropriate textures, swallowing compensatory strategies, and medication administration. SLP also provided education regarding memory compensatory strategies, the importance of 24 hour supervision, and strategies to utilize to maintain a "healthy brain lifestyle." Patient and his daughter verbalized understanding and handouts were given to reinforce information. Patient left upright in wheelchair with daughter present. Continue with current plan of care.      Pain No/Denies Pain   Therapy/Group: Individual Therapy  Abbygayle Helfand 08/24/2022, 2:29 PM

## 2022-08-24 NOTE — Progress Notes (Signed)
Occupational Therapy Session Note  Patient Details  Name: Carlos Shields MRN: [id number] Date of Birth: Nov 27, 1930  Today's Date: 08/24/2022 Session 1 OT Individual Time: [id number] OT Individual Time Calculation (min): 40 min   Session 2 OT Individual Time: 1303-1330 OT Individual Time Calculation (min): 27 min   Short Term Goals: Week 2:  OT Short Term Goal 1 (Week 2): LTG =STG 2/2 ELOS  Skilled Therapeutic Interventions/Progress Updates:    Session 1 Pt greeted semi-reclined in bed and agreeable to OT treatment session. Pt agreeable to wash up and get dressed. Pt needed increased time and min A for bed mobility. He had strong posterior bias on initial sit<>stand requiring 2 trials and cues to maintain anterior weight shift with sit<>stand w/ RW. Pt ambulated to the sink with RW and CGA. Focus on anterior weight shift within sit<>stands at the sink and maintaining weight shift when removing unilateral UE from the sink to wash buttocks and peri-area. Overall min a for any dynamic balance activity with intermittent CGA. Pt with poor carryover of technique for weight shifting. Min A overall for self-care tasks, increased time for initiation. Improved functional use of R UE integrating R UE into most tasks without cues. Pt left seated in wc at end of session with alarm belt on, call bell in reach, and needs met.  Session 2 Pt greeted with daughter Dixon Boos present for family education. Education provided regarding safe BADL performance in home environment, home modifications, DME needs, energy conservation techniques, and safety awareness.  Pt brought to therapy apartment and practiced tub bench transfer in simulated home environment. Educated on use of gait belt and patients tendency for posterior LOB in standing. OT issued fine motor handout and reviewed with pt and daughter. Handofff to SLP for next therapy session.    Therapy Documentation Precautions:  Precautions Precautions: Fall, Other  (comment) Precaution Comments: incontinent, R hemi, seizure Restrictions Weight Bearing Restrictions: No Pain:  Denies pain   Therapy/Group: Individual Therapy  Valma Cava 08/24/2022, 1:38 PM

## 2022-08-24 NOTE — Progress Notes (Signed)
PROGRESS NOTE   Subjective/Complaints:  No acute complaints. No events overnight. Tolerating IVF. Understands he needs to drink more today. CGA/Min A level per OT/PT, per family better than he was at home. Primary barrier remains memory, poor carryover.   Patient happy on pureed diet, per SLP does not want to upgrade back.    LBM 2/11  ROS: Denies fevers, chills, N/V, abdominal pain, constipation, diarrhea, SOB, cough, chest pain, new weakness or paraesthesias.     Objective:   No results found. Recent Labs    08/23/22 0908  WBC 6.7  HGB 9.2*  HCT 29.5*  PLT 273    Recent Labs    08/23/22 0908 08/24/22 0546  NA 141 137  K 4.4 4.3  CL 107 105  CO2 25 26  GLUCOSE 99 97  BUN 43* 47*  CREATININE 1.67* 1.61*  CALCIUM 9.2 8.7*     Intake/Output Summary (Last 24 hours) at 08/24/2022 0858 Last data filed at 08/24/2022 0740 Gross per 24 hour  Intake --  Output 575 ml  Net -575 ml         Physical Exam: Vital Signs Blood pressure (!) 155/72, pulse 98, temperature 97.6 F (36.4 C), temperature source Oral, resp. rate 18, height 5' 4"$  (1.626 m), weight 53.3 kg, SpO2 (!) 86 %.  Constitutional: No distress . Vital signs reviewed. Working with therapy in gym HEENT: NCAT, conjugate gaze, oral membranes moist Neck: supple Cardiovascular: RRR without murmur. No JVD    Respiratory/Chest: CTA Bilaterally without wheezes or rales. Normal effort   .  Good air movement GI/Abdomen: BS +, non-tender, non-distended Ext: no clubbing, cyanosis, or edema Psych: pleasant and cooperative  Skin: TCAR site L neck c.d.I  MSK: Antigravity and against resistance in all 4 extremities in bed   Neurologic exam:  Cognition: AAO to person, place ;  time with option - stable + significant memory deficits   Coordination: +very mild bilateral UE intention tremor    Assessment/Plan: 1. Functional deficits which require 3+ hours per  day of interdisciplinary therapy in a comprehensive inpatient rehab setting. Physiatrist is providing close team supervision and 24 hour management of active medical problems listed below. Physiatrist and rehab team continue to assess barriers to discharge/monitor patient progress toward functional and medical goals  Care Tool:  Bathing    Body parts bathed by patient: Right arm, Left arm, Chest, Abdomen, Right upper leg, Left upper leg, Face, Front perineal area   Body parts bathed by helper: Buttocks, Right lower leg, Left lower leg     Bathing assist Assist Level: Moderate Assistance - Patient 50 - 74%     Upper Body Dressing/Undressing Upper body dressing   What is the patient wearing?: Pull over shirt    Upper body assist Assist Level: Minimal Assistance - Patient > 75%    Lower Body Dressing/Undressing Lower body dressing      What is the patient wearing?: Pants     Lower body assist Assist for lower body dressing: Minimal Assistance - Patient > 75%     Toileting Toileting    Toileting assist Assist for toileting: Moderate Assistance - Patient 50 - 74%  Transfers Chair/bed transfer  Transfers assist     Chair/bed transfer assist level: Supervision/Verbal cueing     Locomotion Ambulation   Ambulation assist      Assist level: Supervision/Verbal cueing Assistive device: Walker-rolling Max distance: 150 ft   Walk 10 feet activity   Assist     Assist level: Supervision/Verbal cueing Assistive device: Walker-rolling   Walk 50 feet activity   Assist    Assist level: Supervision/Verbal cueing Assistive device: Walker-rolling    Walk 150 feet activity   Assist    Assist level: Supervision/Verbal cueing Assistive device: Walker-rolling    Walk 10 feet on uneven surface  activity   Assist     Assist level: Moderate Assistance - Patient - 50 - 74% Assistive device: Aeronautical engineer Is the patient  using a wheelchair?: Yes Type of Wheelchair: Manual    Wheelchair assist level: Maximal Assistance - Patient 25 - 49% Max wheelchair distance: 5 ft    Wheelchair 50 feet with 2 turns activity    Assist        Assist Level: Total Assistance - Patient < 25%   Wheelchair 150 feet activity     Assist      Assist Level: Total Assistance - Patient < 25%   Blood pressure (!) 155/72, pulse 98, temperature 97.6 F (36.4 C), temperature source Oral, resp. rate 18, height 5' 4"$  (1.626 m), weight 53.3 kg, SpO2 (!) 86 %.  Medical Problem List and Plan: 1. Functional deficits secondary to multiple small acute ischemic CVA infarcts involving the left basal ganglia, left frontal, right occipital cortex and adjacent white matter              -patient may shower             -ELOS/Goals: 10-12 days, supervision/CGA PT, supervision OT, supervision SLP; DC 2/16  -Continue CIR therapies including PT, OT, SLP  - Notable significant cognitive deficits, OP documentation of ?Parkinsonism d/t shuffling gait, tremor, and memory deficits; was not worked up.   2/9: Exam not consistent with parkinsonism, suspect generalized dementia. SLUMS 4/26 on intake. Will start memantine 5 mg daily x7 days, then increase to 5 mg BID as tolerated.  -> 2/12 cognitive speed appears improved; continue current dose  2.  Antithrombotics: -DVT/anticoagulation: Lovenox 30 mg daily  -antiplatelet therapy: DAPT ASA 40m QD + Plavix 740mQD x3 weeks then Plavix alone (~08/29/22) 3. Pain Management: tylenol PRN, oxy 5 mg Q4H PRN.  4. Mood/Behavior/Sleep: Trazodone 25-50 mg PRN             -antipsychotic agents: none 5. Neuropsych/cognition: This patient is capable of making decisions on his own behalf. 6. Skin/Wound Care: Standard skin assessments, wound care.  - Would encourage removal of condom catheter to avoid skin  - removed 2/6; incontinent, PVRs QID and times toiletting Q4H  -2/11 PVRs not elevated, occasional  incontinence, appears improved - monitor 2/13   7. Fluids/Electrolytes/Nutrition: Dysphagia. Heart healthy diet, thins.    - Note OP workup for oropharyngeal dysphagia 06/2022 - Admission labs stable, mild decrease in NA - repeat wednesday  - 2/5 encourage fluids  - 2/6: Full supervision with D2 thins per SLP d/t choking on food, impulsive eating. Meds crushed. Per SLP was modifying diet at home PTA.    - 2/7: Downgraded to D1; barium swallow pending. Encourage PO intakes today, repeat labs 2/9.   - 2/9: Barium swallow: patient's swallow appeared grossly functional for  consistencies tested, will f/u for potential impact on more solid consistencies given clinician presentation at the bedside. Recommend patient continue current diet of Dys. 2 textures with thin liquids with full supervision to maximize safety with overall PO intake.   -2/10 SLP recommends continuation of Dys 1 textures diet per patient request - tolerating   - 2/13 Patient happy on pureed and thins; continue    8. L internal Carotid Artery stenosis s/p TCAR 08/12/22, continue ASA/Plavix/Crestor             -f/up in 4 weeks with carotid duplex with Dr. Donzetta Matters  9. Seizures: continue Keppra 551m BID  10. HTN: continue Norvasc 525mQD, held home Metoprolol 5030mID during hospitalization but restarted at 12.5mg82mD -Per neurology, allowed for permissive HTN (ok if <220/120) but gradually normalize by 08/14/22, long term normotensive goal    08/24/2022    5:25 AM 08/23/2022    7:58 PM 08/23/2022    2:15 PM  Vitals with BMI  Systolic 155 [id number] 72 64 57  Pulse 98 94 75    -2/11-13 well controlled  11. Acute on Chronic CKD stage 3a: baseline Cr ~1.2, peaked at 2.1, renal U/S 08/06/22 showing chronic medical renal disease but no hydronephrosis             -Avoid nephrotoxic agents, monitoring UOP   - 2/5 labs: Cr 1.6; stable, BUN sl up---push fluids - Cr improved 1.5 -> 1.69 2/10 -> 1.67 2/12 with BUN increase 32-> 44   -  Add IVF 1 L at 75 cc/hr overnight; recheck BMP in AM   - 2/13: BMP unchanged, ? If IVF received, will d.w nursing  12. Chronic normocytic anemia: baseline hgb 9-10, monitor CBC - stable             -Hgb down to 8.2 after TCAR, stable at 8.0 on 08/13/22; 8.3 2/4; 9.3 2/7-> 9.2 9/12  13. Abnormal TSH: TSH 4.9 on admission 08/05/22, free T4 0.88; WNL.    - F/u with PCP 14. Hyponatremia: improved during hospitalization with hydration, monitor BMP   - Decreased 134 -> 132 2/4 -> 138 2/7 -> 141 2/12 -> 137 2/13  - Fluids as above    LOS: 10 days A FACE TO FACE EVALUATION WAS PERFORMED  MorgGertie Gowda3/2024, 8:58 AM

## 2022-08-25 NOTE — Progress Notes (Signed)
Physical Therapy Session Note  Patient Details  Name: Carlos Shields MRN: [id number] Date of Birth: 1930-08-15  Today's Date: 08/25/2022 PT Individual Time: 1st Treatment Session: 1100-1200; 2nd Treatment Session: 1415-1530 PT Individual Time Calculation (min): 60 min; 75 min  Short Term Goals: Week 2:  PT Short Term Goal 1 (Week 2): STGs=LTGs secondary to ELOS  Skilled Therapeutic Interventions/Progress Updates:  1st Treatment Session- Patient greeted reclined in wheelchair in room and agreeable to PT treatment session. Patient requesting to use the restroom prior to start of session. Patient stood from wheelchair with RW and CGA for safety- MAX VC for proper sequencing and increased anterior weight shift. Patient ambulated to the bathroom with RW and SBA for safety- While stepping over the threshold into the bathroom, patient had a posterior LOB requiring MinA secondary to poor righting reactions. While standing, patient was able to doff brief/pants with CGA for stability. Patient with continent BM and was able to initiate back pericare, however required total assistance for ensuring he was clean. Patient stood from toilet with RW and R grab bar and SBA with VC for anterior weight shift. Patient was able to pull up brief/pants with MinA. Patient then ambulated ~10' to the sink with RW and Supv and stood at the sink while washing his hands ~2 minutes. Patient then ambulated from his room to day room with RW and supv- VC for increased BOS and increased step length.   Patient performed transfer training task in the rehab gym by standing from various surfaces, walking to another surfaces and sitting. Patient performed x6 transfers with gait in between with RW and Supv. With increased repetition, patient was able to recall placing a hand back prior to sitting, however continued to require VC for placing B hands on the surface and an anterior weight shift while standing.   Patient performed x5 sit/stands  with RW and SBA- VC for proper hand placement, anterior weight shift and tucked B feet underneath him with absent carryover noted. As patient fatigued, he demonstrated poor anterior weight shifting ability with increase in posterior bias.   Patient ambulated back to his room with RW and supv- VC for increased BOS, increased cadence and increased B step length with good improvements noted, however unable to sustain as he fatigues. Patient left sitting upright in wheelchair with posey belt on, call bell within reach and all needs met.    2nd Treatment Session- Patient greeted sitting upright in wheelchair in room and agreeable to PT treatment session- Patient denied needing to use the restroom prior to start of session. Patient stood from wheelchair with RW and Supv with MAX cues for proper hand placement and anterior weight shift. Patient ambulated from his room to day room with RW and Supv- VC throughout for increased step length, increased BOS and stepping within the frame of the rolling walker. Patient participated in Valentine's Day activities and made a card for his daughter to show his appreciation for her support- Patient tasked with leaning forward throughout card making activity in order to improve overall stability and decrease posterior bias.   Patient gait trained x190' with RW and supv for safety- Patient continues to require VC for postural extension, increased B step length and increased BOS throughout gait with no carryover noted.   Patient completed the Nustep x10 minutes on level 3 with B UE/LE- Encouragement for increased cadence.   Therapist and patient spent time discussing techniques and strategies to do at home in order to ensure overall safety,  however patient demonstrates poor carryover and consistently required VC for recall.   Patient ambulated from day room to his room (>200') with RW and supv- VC for all the above mentioned gait deviations. Patient left sitting upright in  wheelchair in room (per patient request) with posey belt on, call bell within reach, tray table in front and all needs met.    Therapy Documentation Precautions:  Precautions Precautions: Fall, Other (comment) Precaution Comments: incontinent, R hemi, seizure Restrictions Weight Bearing Restrictions: No   Therapy/Group: Individual Therapy  Sabriah Hobbins 08/25/2022, 8:01 AM

## 2022-08-25 NOTE — Progress Notes (Signed)
Occupational Therapy Session Note  Patient Details  Name: Carlos Shields MRN: [id number] Date of Birth: 02-May-1931  Today's Date: 08/25/2022 OT Individual Time: [id number] OT Individual Time Calculation (min): 56 min    Short Term Goals: Week 2:  OT Short Term Goal 1 (Week 2): LTG =STG 2/2 ELOS  Skilled Therapeutic Interventions/Progress Updates:    Pt resting in w/c upon arrival. Pt declined bathing this morning but agreeable to changing clothing. Pt declined use of bathroom. OT intervention with focus on dressing with sit<>stand from w/c at sink. Pt stood at sink to brush teeth. UB dressing with min A to doff shirts but supervision to don. Pt doffed pants and socks with supervision. Min A for threading pants over BLE. Pt able to pull pants over hips while standing at sink. Pt donned socks and shoes with supervision. Pt requires more then a reasonable amount of time to complete tasks and orient clothing prior to donning. Pt remained in w/c with belt alarm activated. All needs within reach.   Therapy Documentation Precautions:  Precautions Precautions: Fall, Other (comment) Precaution Comments: incontinent, R hemi, seizure Restrictions Weight Bearing Restrictions: No General:   Vital Signs:   Pain:  Pt denies pain this morning  Therapy/Group: Individual Therapy  Leroy Libman 08/25/2022, 10:14 AM

## 2022-08-25 NOTE — Progress Notes (Signed)
Physical Therapy Discharge Summary  Patient Details  Name: Rondrick Scherger MRN: [id number] Date of Birth: 1931/06/22  Date of Discharge from PT service:August 26, 2022   Patient has met {NUMBERS [id number]} of 7 long term goals due to improved activity tolerance, improved balance, increased strength, improved attention, and improved awareness.  Patient to discharge at an ambulatory level Supervision.   Patient's care partner is independent to provide the necessary physical and cognitive assistance at discharge.  Reasons goals not met: ***  Recommendation:  Patient will benefit from ongoing skilled PT services in outpatient setting to continue to advance safe functional mobility, address ongoing impairments in dynamic stability, global strengthening, cognition, endurance/activity tolerance, and minimize fall risk.  Equipment: No equipment provided  Reasons for discharge: treatment goals met and discharge from hospital  Patient/family agrees with progress made and goals achieved: Yes  PT Discharge Precautions/Restrictions Precautions Precautions: Fall Precaution Comments: incontinent, R hemi, seizure Restrictions Weight Bearing Restrictions: No Pain Interference Pain Interference Pain Effect on Sleep: 0. Does not apply - I have not had any pain or hurting in the past 5 days Pain Interference with Therapy Activities: 0. Does not apply - I have not received rehabilitationtherapy in the past 5 days Pain Interference with Day-to-Day Activities: 1. Rarely or not at all Vision/Perception  Vision - History Ability to See in Adequate Light: 0 Adequate Perception Perception: Impaired Praxis Praxis: Impaired Praxis Impairment Details: Motor planning  Cognition Arousal/Alertness: Awake/alert Orientation Level: Oriented to person;Oriented to place;Oriented to situation Year: Other (Comment) (Unable to come up with a year) Month: February Day of Week: Incorrect Sustained Attention:  Appears intact Memory: Impaired Awareness: Impaired Problem Solving: Impaired Safety/Judgment: Impaired Sensation Sensation Light Touch: Appears Intact Proprioception: Impaired by gross assessment Additional Comments: posterior bias Coordination Gross Motor Movements are Fluid and Coordinated: No Fine Motor Movements are Fluid and Coordinated: No Coordination and Movement Description: grossly uncoordinated 2/2 right hemi Motor  Motor Motor: Hemiplegia Motor - Discharge Observations: R hemi, but improving  Mobility Bed Mobility Bed Mobility: Rolling Right;Rolling Left;Supine to Sit;Sit to Supine Rolling Right: Supervision/verbal cueing Rolling Left: Supervision/Verbal cueing Supine to Sit: Supervision/Verbal cueing Sit to Supine: Supervision/Verbal cueing Transfers Transfers: Sit to Stand;Stand to Sit;Stand Pivot Transfers Sit to Stand: Supervision/Verbal cueing Stand to Sit: Supervision/Verbal cueing Stand Pivot Transfers: Supervision/Verbal cueing Stand Pivot Transfer Details: Verbal cues for sequencing;Verbal cues for precautions/safety;Verbal cues for safe use of DME/AE Transfer (Assistive device): Rolling walker Locomotion  Gait Ambulation: Yes Gait Assistance: Supervision/Verbal cueing Gait Distance (Feet): 150 Feet Assistive device: Rolling walker Gait Assistance Details: Verbal cues for sequencing;Verbal cues for safe use of DME/AE;Verbal cues for gait pattern Gait Gait: Yes Gait Pattern: Decreased stance time - left;Shuffle;Narrow base of support Gait velocity: decreased Stairs / Additional Locomotion Stairs: Yes Stairs Assistance: Contact Guard/Touching assist Stair Management Technique: Two rails Number of Stairs: 12 Height of Stairs: 6 Ramp: Contact Guard/touching assist Curb: Contact Guard/Touching assist Wheelchair Mobility Wheelchair Mobility: No  Trunk/Postural Assessment  Cervical Assessment Cervical Assessment: Exceptions to [facility] (Forward  head) Thoracic Assessment Thoracic Assessment: Exceptions to [facility] (Posterior bias) Lumbar Assessment Lumbar Assessment: Exceptions to [facility] (Posterior bias with posterior pelvi tilt) Postural Control Postural Control: Deficits on evaluation Trunk Control: Delayed and inadequate with posterior bias  Balance Balance Balance Assessed: Yes Static Sitting Balance Static Sitting - Balance Support: Feet supported Static Sitting - Level of Assistance: 6: Modified independent (Device/Increase time) Dynamic Sitting Balance Dynamic Sitting - Balance Support: Feet unsupported;During functional activity Dynamic Sitting -  Level of Assistance: 5: Stand by assistance Static Standing Balance Static Standing - Balance Support: Bilateral upper extremity supported Static Standing - Level of Assistance: 5: Stand by assistance Dynamic Standing Balance Dynamic Standing - Balance Support: During functional activity;Bilateral upper extremity supported Dynamic Standing - Level of Assistance: 5: Stand by assistance Extremity Assessment      RLE Assessment RLE Assessment: Exceptions to [facility] General Strength Comments: Grossly 3+/5 LLE Assessment General Strength Comments: Grossly 4/5   Shalaunda Weatherholtz 08/25/2022, 11:33 AM

## 2022-08-25 NOTE — Progress Notes (Signed)
Speech Language Pathology Daily Session Note  Patient Details  Name: Carlos Shields MRN: [id number] Date of Birth: [birth year]  Today's Date: 08/25/2022 SLP Individual Time: 0730-0800 SLP Individual Time Calculation (min): 30 min  Short Term Goals: Week 2: SLP Short Term Goal 1 (Week 2): STGs=LTGs due to ELOS  Skilled Therapeutic Interventions: Pt seen this date for skilled ST intervention targeting swallowing and basic cognitive goals outlined in care plan. Pt received supine in bed; eyes closed. Aroused to name with ease. Agreeable to intervention in hospital room. Transferred from bed to w/c with Mod A for this therapist.    Today's session with emphasis on diet tolerance monitoring and basic problem-solving within the context of self-feeding. SLP reviewed aspiration precautions at the beginning of meal, with pt implementing with overall Sup A. No overt s/sx concerning for compromised airway protection with current diet textures - no coughing, throat clearing, nor change in vocal quality. Min A for implementing basic problem-solving during meals (e.g. use of spoon vs fork for eating cream of wheat). Oriented to date at onset of today's session. Min A for recall of date following delay with distractions. Pt intermittently appeared distracted by external stimuli; therefore, will benefit from continuation of distraction free environment with PO intake. Nevertheless, this does appear to be improving - pt able to participate in small bouts of conversation with continuation of Sup A. Recommend continuation of current diet with set-up and intermittent vs full supervision with distractions limited. NT and RN made aware. Pt continues to report liking pureed textures for ease of intake and swallowing; therefore, continue with this at d/c.  Pt left in room and OOB in w/c with all safety measures activated and call bell within reach. Continue per current ST POC.  Pain No pain reported; NAD  Therapy/Group:  Individual Therapy  Luceil Herrin A Allisa Einspahr 08/25/2022, 11:42 AM

## 2022-08-25 NOTE — Progress Notes (Signed)
Patient ID: Carlos Shields, male   DOB: 1931/01/29, 89 y.o.   MRN: [id number]  SW faxed outpatient PT/OT/SLP referral to [facility] ([phone]).  Loralee Pacas, MSW, [place] Office: [phone] Cell: [phone] Fax: [phone]

## 2022-08-25 NOTE — Progress Notes (Signed)
PROGRESS NOTE   Subjective/Complaints:  No acute complaints. No events overnight.   ROS: Denies fevers, chills, N/V, abdominal pain, constipation, diarrhea, SOB, cough, chest pain, new weakness or paraesthesias.     Objective:   No results found. Recent Labs    08/23/22 0908  WBC 6.7  HGB 9.2*  HCT 29.5*  PLT 273    Recent Labs    08/23/22 0908 08/24/22 0546  NA 141 137  K 4.4 4.3  CL 107 105  CO2 25 26  GLUCOSE 99 97  BUN 43* 47*  CREATININE 1.67* 1.61*  CALCIUM 9.2 8.7*     Intake/Output Summary (Last 24 hours) at 08/25/2022 1703 Last data filed at 08/25/2022 1625 Gross per 24 hour  Intake 816 ml  Output 951 ml  Net -135 ml         Physical Exam: Vital Signs Blood pressure 134/60, pulse 74, temperature 97.6 F (36.4 C), resp. rate 17, height 5' 4"$  (1.626 m), weight 53.3 kg, SpO2 100 %.  Constitutional: No distress . Vital signs reviewed. Working with therapy in gym HEENT: NCAT, conjugate gaze, oral membranes moist Neck: supple Cardiovascular: RRR without murmur. No JVD    Respiratory/Chest: CTA Bilaterally without wheezes or rales. Normal effort   .  Good air movement GI/Abdomen: BS +, non-tender, non-distended Ext: no clubbing, cyanosis, or edema Psych: pleasant and cooperative  Skin: TCAR site L neck c.d.I  MSK: Antigravity and against resistance in all 4 extremities in WC at bedside   Neurologic exam:  Cognition: AAO to person, place ;  time with option - stable  + significant memory deficits , seem improved from admission  Coordination: +very mild bilateral UE intention tremor    Assessment/Plan: 1. Functional deficits which require 3+ hours per day of interdisciplinary therapy in a comprehensive inpatient rehab setting. Physiatrist is providing close team supervision and 24 hour management of active medical problems listed below. Physiatrist and rehab team continue to assess  barriers to discharge/monitor patient progress toward functional and medical goals  Care Tool:  Bathing    Body parts bathed by patient: Right arm, Left arm, Chest, Abdomen, Right upper leg, Left upper leg, Face, Front perineal area   Body parts bathed by helper: Buttocks, Right lower leg, Left lower leg     Bathing assist Assist Level: Moderate Assistance - Patient 50 - 74%     Upper Body Dressing/Undressing Upper body dressing   What is the patient wearing?: Pull over shirt    Upper body assist Assist Level: Minimal Assistance - Patient > 75%    Lower Body Dressing/Undressing Lower body dressing      What is the patient wearing?: Pants     Lower body assist Assist for lower body dressing: Minimal Assistance - Patient > 75%     Toileting Toileting    Toileting assist Assist for toileting: Moderate Assistance - Patient 50 - 74%     Transfers Chair/bed transfer  Transfers assist     Chair/bed transfer assist level: Supervision/Verbal cueing     Locomotion Ambulation   Ambulation assist      Assist level: Supervision/Verbal cueing Assistive device: Walker-rolling Max distance: >78'  Walk 10 feet activity   Assist     Assist level: Supervision/Verbal cueing Assistive device: Walker-rolling   Walk 50 feet activity   Assist    Assist level: Supervision/Verbal cueing Assistive device: Walker-rolling    Walk 150 feet activity   Assist    Assist level: Supervision/Verbal cueing Assistive device: Walker-rolling    Walk 10 feet on uneven surface  activity   Assist     Assist level: Supervision/Verbal cueing Assistive device: Walker-rolling   Wheelchair     Assist Is the patient using a wheelchair?: Yes Type of Wheelchair: Manual    Wheelchair assist level: Maximal Assistance - Patient 25 - 49% Max wheelchair distance: 5 ft    Wheelchair 50 feet with 2 turns activity    Assist        Assist Level: Total  Assistance - Patient < 25%   Wheelchair 150 feet activity     Assist      Assist Level: Total Assistance - Patient < 25%   Blood pressure 134/60, pulse 74, temperature 97.6 F (36.4 C), resp. rate 17, height 5' 4"$  (1.626 m), weight 53.3 kg, SpO2 100 %.  Medical Problem List and Plan: 1. Functional deficits secondary to multiple small acute ischemic CVA infarcts involving the left basal ganglia, left frontal, right occipital cortex and adjacent white matter              -patient may shower             -ELOS/Goals: 10-12 days, supervision/CGA PT, supervision OT, supervision SLP; DC 2/16  -Continue CIR therapies including PT, OT, SLP  - Notable significant cognitive deficits, OP documentation of ?Parkinsonism d/t shuffling gait, tremor, and memory deficits; was not worked up.   2/9: Exam not consistent with parkinsonism, suspect generalized dementia. SLUMS 4/26 on intake. Will start memantine 5 mg daily x7 days, then increase to 5 mg BID as tolerated.  -> 2/12 cognitive speed appears improved; continue current dose  2.  Antithrombotics: -DVT/anticoagulation: Lovenox 30 mg daily  -antiplatelet therapy: DAPT ASA 66m QD + Plavix 764mQD x3 weeks then Plavix alone (~08/29/22) 3. Pain Management: tylenol PRN, oxy 5 mg Q4H PRN.  4. Mood/Behavior/Sleep: Trazodone 25-50 mg PRN             -antipsychotic agents: none 5. Neuropsych/cognition: This patient is capable of making decisions on his own behalf. 6. Skin/Wound Care: Standard skin assessments, wound care.  - Would encourage removal of condom catheter to avoid skin  - removed 2/6; incontinent, PVRs QID and times toiletting Q4H  -2/11 PVRs not elevated, occasional incontinence, appears improved    7. Fluids/Electrolytes/Nutrition: Dysphagia. Heart healthy diet, thins.    - Note OP workup for oropharyngeal dysphagia 06/2022 - Admission labs stable, mild decrease in NA - repeat wednesday  - 2/5 encourage fluids  - 2/6: Full supervision  with D2 thins per SLP d/t choking on food, impulsive eating. Meds crushed. Per SLP was modifying diet at home PTA.    - 2/7: Downgraded to D1; barium swallow pending. Encourage PO intakes today, repeat labs 2/9.   - 2/9: Barium swallow: patient's swallow appeared grossly functional for consistencies tested, will f/u for potential impact on more solid consistencies given clinician presentation at the bedside. Recommend patient continue current diet of Dys. 2 textures with thin liquids with full supervision to maximize safety with overall PO intake.   -2/10 SLP recommends continuation of Dys 1 textures diet per patient request - tolerating   -  2/13 Patient happy on pureed and thins; continue    8. L internal Carotid Artery stenosis s/p TCAR 08/12/22, continue ASA/Plavix/Crestor             -f/up in 4 weeks with carotid duplex with Dr. Donzetta Matters  9. Seizures: continue Keppra 571m BID  10. HTN: continue Norvasc 572mQD, held home Metoprolol 5083mID during hospitalization but restarted at 12.5mg16mD -Per neurology, allowed for permissive HTN (ok if <220/120) but gradually normalize by 08/14/22, long term normotensive goal    08/25/2022    3:36 PM 08/25/2022    5:16 AM 08/24/2022    7:55 PM  Vitals with BMI  Systolic 134 [id number] 60 63 68  Pulse 74 71 82    -2/11-13 well controlled  11. Acute on Chronic CKD stage 3a: baseline Cr ~1.2, peaked at 2.1, renal U/S 08/06/22 showing chronic medical renal disease but no hydronephrosis             -Avoid nephrotoxic agents, monitoring UOP   - 2/5 labs: Cr 1.6; stable, BUN sl up---push fluids - Cr improved 1.5 -> 1.69 2/10 -> 1.67 2/12 with BUN increase 32-> 44   - Add IVF 1 L at 75 cc/hr overnight; recheck BMP in AM   - 2/13: BMP unchanged, ? If IVF received, will d.w nursing   - 2/14: received IVF, will repeat labs in AM; if uptrending BUN may need to remove remove protein supplements and obtain fobt. Cr appears stable 1.6   12. Chronic normocytic  anemia: baseline hgb 9-10, monitor CBC - stable             -Hgb down to 8.2 after TCAR, stable at 8.0 on 08/13/22; 8.3 2/4; 9.3 2/7-> 9.2 9/12  13. Abnormal TSH: TSH 4.9 on admission 08/05/22, free T4 0.88; WNL.    - F/u with PCP 14. Hyponatremia: improved during hospitalization with hydration, monitor BMP   - Decreased 134 -> 132 2/4 -> 138 2/7 -> 141 2/12 -> 137 2/13  - Fluids as above    LOS: 11 days A FACE TO FACE EVALUATION WAS PERFORMED  MorgGertie Gowda4/2024, 5:03 PM

## 2022-08-26 ENCOUNTER — Other Ambulatory Visit (HOSPITAL_COMMUNITY): Payer: Self-pay

## 2022-08-26 LAB — BASIC METABOLIC PANEL
Anion gap: 6 (ref 5–15)
BUN: 42 mg/dL — ABNORMAL HIGH (ref 8–23)
CO2: 25 mmol/L (ref 22–32)
Calcium: 8.8 mg/dL — ABNORMAL LOW (ref 8.9–10.3)
Chloride: 105 mmol/L (ref 98–111)
Creatinine, Ser: 1.8 mg/dL — ABNORMAL HIGH (ref 0.61–1.24)
GFR, Estimated: 36 mL/min — ABNORMAL LOW (ref >60)
Glucose, Bld: 92 mg/dL (ref 70–99)
Potassium: 4.4 mmol/L (ref 3.5–5.1)
Sodium: 136 mmol/L (ref 135–145)

## 2022-08-26 LAB — CBC
HCT: 25.7 % — ABNORMAL LOW (ref 39.0–52.0)
Hemoglobin: 8.5 g/dL — ABNORMAL LOW (ref 13.0–17.0)
MCH: 29.5 pg (ref 26.0–34.0)
MCHC: 33.1 g/dL (ref 30.0–36.0)
MCV: 89.2 fL (ref 80.0–100.0)
Platelets: 252 10*3/uL (ref 150–400)
RBC: 2.88 MIL/uL — ABNORMAL LOW (ref 4.22–5.81)
RDW: 12.8 % (ref 11.5–15.5)
WBC: 7 10*3/uL (ref 4.0–10.5)
nRBC: 0 % (ref 0.0–0.2)

## 2022-08-26 MED ORDER — OXYCODONE HCL 5 MG PO TABS
5.0000 mg | ORAL_TABLET | ORAL | 0 refills | Status: DC | PRN
  Filled 2022-08-26: qty 30, 5d supply, fill #0

## 2022-08-26 MED ORDER — LEVETIRACETAM 500 MG PO TABS
500.0000 mg | ORAL_TABLET | Freq: Two times a day (BID) | ORAL | Status: DC
  Administered 2022-08-26 – 2022-08-27 (×2): 500 mg via ORAL
  Filled 2022-08-26 (×2): qty 1

## 2022-08-26 MED ORDER — AMLODIPINE BESYLATE 5 MG PO TABS
5.0000 mg | ORAL_TABLET | Freq: Every day | ORAL | 0 refills | Status: DC
  Filled 2022-08-26: qty 30, 30d supply, fill #0

## 2022-08-26 MED ORDER — CLOPIDOGREL BISULFATE 75 MG PO TABS
75.0000 mg | ORAL_TABLET | Freq: Every day | ORAL | 0 refills | Status: DC
  Filled 2022-08-26: qty 30, 30d supply, fill #0

## 2022-08-26 MED ORDER — METOPROLOL TARTRATE 25 MG PO TABS
12.5000 mg | ORAL_TABLET | Freq: Two times a day (BID) | ORAL | 0 refills | Status: DC
  Filled 2022-08-26: qty 60, 60d supply, fill #0

## 2022-08-26 MED ORDER — POLYETHYLENE GLYCOL 3350 17 G PO PACK: 17.0000 g | PACK | Freq: Every day | ORAL | 0 refills | Status: DC

## 2022-08-26 MED ORDER — LEVETIRACETAM 500 MG PO TABS
500.0000 mg | ORAL_TABLET | Freq: Two times a day (BID) | ORAL | 0 refills | Status: DC
  Filled 2022-08-26: qty 60, 30d supply, fill #0

## 2022-08-26 MED ORDER — ROSUVASTATIN CALCIUM 10 MG PO TABS
10.0000 mg | ORAL_TABLET | Freq: Every day | ORAL | 0 refills | Status: DC
  Filled 2022-08-26: qty 30, 30d supply, fill #0

## 2022-08-26 MED ORDER — PANTOPRAZOLE SODIUM 40 MG PO TBEC
40.0000 mg | DELAYED_RELEASE_TABLET | Freq: Every day | ORAL | 0 refills | Status: DC
  Filled 2022-08-26: qty 30, 30d supply, fill #0

## 2022-08-26 MED ORDER — ACETAMINOPHEN 325 MG PO TABS: 325.0000 mg | ORAL_TABLET | ORAL | Status: DC | PRN

## 2022-08-26 MED ORDER — LEVETIRACETAM 100 MG/ML PO SOLN
500.0000 mg | Freq: Two times a day (BID) | ORAL | 0 refills | Status: DC
  Filled 2022-08-26: qty 300, 30d supply, fill #0

## 2022-08-26 MED ORDER — MEMANTINE HCL 5 MG PO TABS
5.0000 mg | ORAL_TABLET | Freq: Every day | ORAL | 0 refills | Status: DC
  Filled 2022-08-26: qty 30, 30d supply, fill #0

## 2022-08-26 NOTE — Progress Notes (Signed)
Occupational Therapy Session Note  Patient Details  Name: Carlos Shields MRN: [id number] Date of Birth: 12/24/1930  Today's Date: 08/26/2022 Session 1 OT Individual Time: [id number] OT Individual Time Calculation (min): 55 min   Session 2 OT Individual Time: [id number] OT Individual Time Calculation (min): 54 min    Short Term Goals: Week 2:  OT Short Term Goal 1 (Week 2): LTG =STG 2/2 ELOS  Skilled Therapeutic Interventions/Progress Updates:  Session 1   Pt greeted semi-reclined in bed with bed soaked in urine. Pt agreeable to shower today. Pt needed increased time to initiate with cues but supervision for bed mobility with moderate cues. Pt attempting to pull up on RW to stand requiring cues for hand placement and min A for anterior weight shift. CGA A and moderate multimodal cues when turning to sit on shower bench.. Bathing completed with increased time and overall close supervision. Pt able to use grab bars and leaning method to wash buttocks. He still needed min A to transfer out of shower safely. Dressing tasks from wc at the sink with supervision for UB dressing but still needed CGA A for LB dressing due to balance deficits when pulling up pants. Pt needed increased time for all BADL tasks. Pt returned to bed with min A cue to nursing request to scan bladder.   Session 2 Pt greeted seated in wc finishing lunch. Pt with much improved initiation and manipulation of feeding utensil using dominant R hand. Pt decline need to go to the bathroom after finishing meal. Pt completed stand-pivot to NuStep with min A. Pt completed 10 minute son NuStep on level 4. UB there-ex using 3 lb dowel rod, 3 sets of 10 chest press, bicep curl and straight arm raise. Pt returned to room and left seated in wc with alarm belt on, call bell in reach, and needs met.   Therapy Documentation Precautions:  Precautions Precautions: Fall Precaution Comments: incontinent, R hemi, seizure Restrictions Weight Bearing  Restrictions: No Pain:   Denies pain   Therapy/Group: Individual Therapy  Valma Cava 08/26/2022, 1:52 PM

## 2022-08-26 NOTE — Progress Notes (Signed)
PROGRESS NOTE   Subjective/Complaints:  No acute complaints. No events overnight.   Required 1x ISC 2/14, none since. Patient endorsing bladder continence. No further concerns.   ROS: Denies fevers, chills, N/V, abdominal pain, constipation, diarrhea, SOB, cough, chest pain, new weakness or paraesthesias.     Objective:   No results found. Recent Labs    08/26/22 0654  WBC 7.0  HGB 8.5*  HCT 25.7*  PLT 252    Recent Labs    08/24/22 0546 08/26/22 0654  NA 137 136  K 4.3 4.4  CL 105 105  CO2 26 25  GLUCOSE 97 92  BUN 47* 42*  CREATININE 1.61* 1.80*  CALCIUM 8.7* 8.8*     Intake/Output Summary (Last 24 hours) at 08/26/2022 1239 Last data filed at 08/26/2022 0215 Gross per 24 hour  Intake 740 ml  Output 375 ml  Net 365 ml         Physical Exam: Vital Signs Blood pressure 126/62, pulse 72, temperature 97.6 F (36.4 C), temperature source Oral, resp. rate 16, height 5' 4"$  (1.626 m), weight 54.2 kg, SpO2 100 %.  Constitutional: No distress . Vital signs reviewed. Working with therapy in gym HEENT: NCAT, conjugate gaze, oral membranes moist Neck: supple Cardiovascular: RRR without murmur. No JVD    Respiratory/Chest: CTA Bilaterally without wheezes or rales. Normal effort   .  Good air movement GI/Abdomen: BS +, non-tender, non-distended Ext: no clubbing, cyanosis, or edema Psych: pleasant and cooperative  Skin: TCAR site L neck c.d.I  MSK: Antigravity and against resistance in all 4 extremities in WC at bedside   Neurologic exam:  Cognition: AAO to person, place ;  time with option - stable  + significant memory deficits , seem improved from admission  Coordination: +very mild bilateral UE intention tremor - unchanged    Assessment/Plan: 1. Functional deficits which require 3+ hours per day of interdisciplinary therapy in a comprehensive inpatient rehab setting. Physiatrist is providing close  team supervision and 24 hour management of active medical problems listed below. Physiatrist and rehab team continue to assess barriers to discharge/monitor patient progress toward functional and medical goals  Care Tool:  Bathing    Body parts bathed by patient: Right arm, Left arm, Chest, Abdomen, Right upper leg, Left upper leg, Face, Front perineal area   Body parts bathed by helper: Buttocks, Right lower leg, Left lower leg     Bathing assist Assist Level: Moderate Assistance - Patient 50 - 74%     Upper Body Dressing/Undressing Upper body dressing   What is the patient wearing?: Pull over shirt    Upper body assist Assist Level: Minimal Assistance - Patient > 75%    Lower Body Dressing/Undressing Lower body dressing      What is the patient wearing?: Pants     Lower body assist Assist for lower body dressing: Minimal Assistance - Patient > 75%     Toileting Toileting    Toileting assist Assist for toileting: Moderate Assistance - Patient 50 - 74%     Transfers Chair/bed transfer  Transfers assist     Chair/bed transfer assist level: Supervision/Verbal cueing     Locomotion Ambulation  Ambulation assist      Assist level: Supervision/Verbal cueing Assistive device: Walker-rolling Max distance: >150'   Walk 10 feet activity   Assist     Assist level: Supervision/Verbal cueing Assistive device: Walker-rolling   Walk 50 feet activity   Assist    Assist level: Supervision/Verbal cueing Assistive device: Walker-rolling    Walk 150 feet activity   Assist    Assist level: Supervision/Verbal cueing Assistive device: Walker-rolling    Walk 10 feet on uneven surface  activity   Assist     Assist level: Supervision/Verbal cueing Assistive device: Walker-rolling   Wheelchair     Assist Is the patient using a wheelchair?: Yes (transport only) Type of Wheelchair: Manual    Wheelchair assist level: Maximal Assistance -  Patient 25 - 49% Max wheelchair distance: 5 ft    Wheelchair 50 feet with 2 turns activity    Assist        Assist Level: Total Assistance - Patient < 25%   Wheelchair 150 feet activity     Assist      Assist Level: Total Assistance - Patient < 25%   Blood pressure 126/62, pulse 72, temperature 97.6 F (36.4 C), temperature source Oral, resp. rate 16, height 5' 4"$  (1.626 m), weight 54.2 kg, SpO2 100 %.  Medical Problem List and Plan: 1. Functional deficits secondary to multiple small acute ischemic CVA infarcts involving the left basal ganglia, left frontal, right occipital cortex and adjacent white matter              -patient may shower             -ELOS/Goals: 10-12 days, supervision/CGA PT, supervision OT, supervision SLP; DC 2/16  -Continue CIR therapies including PT, OT, SLP  - Notable significant cognitive deficits, OP documentation of ?Parkinsonism d/t shuffling gait, tremor, and memory deficits; was not worked up.   2/9: Exam not consistent with parkinsonism, suspect generalized dementia. SLUMS 4/26 on intake. Will start memantine 5 mg daily x7 days, then increase to 5 mg BID as tolerated.  -> 2/12 cognitive speed appears improved; continue current dose  2.  Antithrombotics: -DVT/anticoagulation: Lovenox 30 mg daily  -antiplatelet therapy: DAPT ASA 22m QD + Plavix 747mQD x3 weeks then Plavix alone (~08/29/22) 3. Pain Management: tylenol PRN, oxy 5 mg Q4H PRN.  4. Mood/Behavior/Sleep: Trazodone 25-50 mg PRN             -antipsychotic agents: none 5. Neuropsych/cognition: This patient is capable of making decisions on his own behalf. 6. Skin/Wound Care: Standard skin assessments, wound care.  - Would encourage removal of condom catheter to avoid skin  - removed 2/6; incontinent, PVRs QID and times toiletting Q4H  -2/11 PVRs not elevated, occasional incontinence, appears improved    7. Fluids/Electrolytes/Nutrition: Dysphagia. Heart healthy diet, thins.    -  Note OP workup for oropharyngeal dysphagia 06/2022 - Admission labs stable, mild decrease in NA - repeat wednesday  - 2/5 encourage fluids  - 2/6: Full supervision with D2 thins per SLP d/t choking on food, impulsive eating. Meds crushed. Per SLP was modifying diet at home PTA.    - 2/7: Downgraded to D1; barium swallow pending. Encourage PO intakes today, repeat labs 2/9.   - 2/9: Barium swallow: patient's swallow appeared grossly functional for consistencies tested, will f/u for potential impact on more solid consistencies given clinician presentation at the bedside. Recommend patient continue current diet of Dys. 2 textures with thin liquids with full supervision to  maximize safety with overall PO intake.   -2/10 SLP recommends continuation of Dys 1 textures diet per patient request - tolerating   - 2/13 Patient happy on pureed and thins; continue    8. L internal Carotid Artery stenosis s/p TCAR 08/12/22, continue ASA/Plavix/Crestor             -f/up in 4 weeks with carotid duplex with Dr. Donzetta Matters  9. Seizures: continue Keppra 575m BID - change to tablets 2/15  10. HTN: continue Norvasc 53mQD, held home Metoprolol 507mID during hospitalization but restarted at 12.5mg61mD -Per neurology, allowed for permissive HTN (ok if <220/120) but gradually normalize by 08/14/22, long term normotensive goal    08/26/2022   11:15 AM 08/26/2022    5:17 AM 08/25/2022    7:38 PM  Vitals with BMI  Systolic 126 [id number] 62 78 77  Pulse 72 76 77    -2/11-13 well controlled  11. Acute on Chronic CKD stage 3a: baseline Cr ~1.2, peaked at 2.1, renal U/S 08/06/22 showing chronic medical renal disease but no hydronephrosis             -Avoid nephrotoxic agents, monitoring UOP   - 2/5 labs: Cr 1.6; stable, BUN sl up---push fluids - Cr improved 1.5 -> 1.69 2/10 -> 1.67 2/12 with BUN increase 32-> 44   - Add IVF 1 L at 75 cc/hr overnight; recheck BMP in AM   - 2/13: BMP unchanged, ? If IVF received, will  d.w nursing   - 2/14: received IVF, will repeat labs in AM; if uptrending BUN may need to remove remove protein supplements and obtain fobt. Cr appears stable 1.6   2/15: cR 1.8 today, BUN down/stable. On chart review, has been between 1.5-1.9 entire hospitalization; good PO intakes, no obvious nephrotoxic meds; likely represents new baseline.   12. Chronic normocytic anemia: baseline hgb 9-10, monitor CBC - stable             -Hgb down to 8.2 after TCAR, stable at 8.0 on 08/13/22; 8.3 2/4; 9.3 2/7-> 9.2 9/12  13. Abnormal TSH: TSH 4.9 on admission 08/05/22, free T4 0.88; WNL.    - F/u with PCP 14. Hyponatremia: improved during hospitalization with hydration, monitor BMP   - Decreased 134 -> 132 2/4 -> 138 2/7 -> 141 2/12 -> 137 2/13 -> 136 2/15; stable     LOS: 12 days A FACE TO FACE EVALUATION WAS PERFORMED  MorgGertie Gowda5/2024, 12:39 PM

## 2022-08-26 NOTE — Progress Notes (Signed)
Physical Therapy Session Note  Patient Details  Name: Carlos Shields MRN: [id number] Date of Birth: May 04, 1931  Today's Date: 08/26/2022 PT Individual Time: [id number] PT Individual Time Calculation (min): 60 min   Short Term Goals: Week 2:  PT Short Term Goal 1 (Week 2): STGs=LTGs secondary to ELOS  Skilled Therapeutic Interventions/Progress Updates:    pt received in bed and agreeable to therapy. No complaint of pain. Session focused on d/c assessment. Sit to stand and Stand pivot transfer with RW with supervision. Pt transported to therapy gym for time management and energy conservation. Pt navigated ramp, mulch, and curb with supervision-CGA. Pt required cueing for increased step length and RW proximity. Pt also navigated stairs x 12 with 2 hand rails and CGA. Pt ambulated >200 ft back to room and remained in w/c at end of session. Discussed d/c plan and pt expressed readiness and excitement about going home. Pt remained in w/c and was left with all needs in reach and alarm active.   Therapy Documentation Precautions:  Precautions Precautions: Fall Precaution Comments: incontinent, R hemi, seizure Restrictions Weight Bearing Restrictions: No General:       Therapy/Group: Individual Therapy  Mickel Fuchs 08/26/2022, 12:47 PM

## 2022-08-26 NOTE — Plan of Care (Signed)
  Problem: RH Problem Solving Goal: LTG Patient will demonstrate problem solving for (SLP) Description: LTG:  Patient will demonstrate problem solving for basic/complex daily situations with cues  (SLP) Outcome: Completed/Met   Problem: RH Memory Goal: LTG Patient will use memory compensatory aids to (SLP) Description: LTG:  Patient will use memory compensatory aids to recall biographical/new, daily complex information with cues (SLP) Outcome: Completed/Met   Problem: RH Awareness Goal: LTG: Patient will demonstrate awareness during functional activites type of (SLP) Description: LTG: Patient will demonstrate awareness during functional activites type of (SLP) Outcome: Completed/Met   Problem: RH Swallowing Goal: LTG Patient will consume least restrictive diet using compensatory strategies with assistance (SLP) Description: LTG:  Patient will consume least restrictive diet using compensatory strategies with assistance (SLP) Outcome: Completed/Met Goal: LTG Patient will participate in dysphagia therapy to increase swallow function with assistance (SLP) Description: LTG:  Patient will participate in dysphagia therapy to increase swallow function with assistance (SLP) Outcome: Completed/Met

## 2022-08-26 NOTE — Plan of Care (Signed)
  Problem: RH Balance Goal: LTG Patient will maintain dynamic standing with ADLs (OT) Description: LTG:  Patient will maintain dynamic standing balance with assist during activities of daily living (OT)  Outcome: Completed/Met   Problem: Sit to Stand Goal: LTG:  Patient will perform sit to stand in prep for activites of daily living with assistance level (OT) Description: LTG:  Patient will perform sit to stand in prep for activites of daily living with assistance level (OT) Outcome: Completed/Met   Problem: RH Eating Goal: LTG Patient will perform eating w/assist, cues/equip (OT) Description: LTG: Patient will perform eating with assist, with/without cues using equipment (OT) Outcome: Completed/Met   Problem: RH Grooming Goal: LTG Patient will perform grooming w/assist,cues/equip (OT) Description: LTG: Patient will perform grooming with assist, with/without cues using equipment (OT) Outcome: Completed/Met   Problem: RH Bathing Goal: LTG Patient will bathe all body parts with assist levels (OT) Description: LTG: Patient will bathe all body parts with assist levels (OT) Outcome: Completed/Met   Problem: RH Dressing Goal: LTG Patient will perform upper body dressing (OT) Description: LTG Patient will perform upper body dressing with assist, with/without cues (OT). Outcome: Completed/Met Goal: LTG Patient will perform lower body dressing w/assist (OT) Description: LTG: Patient will perform lower body dressing with assist, with/without cues in positioning using equipment (OT) Outcome: Not Met (add Reason) Note: Pt still needs CGA for LB dressing   Problem: RH Toileting Goal: LTG Patient will perform toileting task (3/3 steps) with assistance level (OT) Description: LTG: Patient will perform toileting task (3/3 steps) with assistance level (OT)  Outcome: Not Met (add Reason) Note: Pt needs CGA for toileting   Problem: RH Functional Use of Upper Extremity Goal: LTG Patient will use  RT/LT upper extremity as a (OT) Description: LTG: Patient will use right/left upper extremity as a stabilizer/gross assist/diminished/nondominant/dominant level with assist, with/without cues during functional activity (OT) Outcome: Completed/Met   Problem: RH Toilet Transfers Goal: LTG Patient will perform toilet transfers w/assist (OT) Description: LTG: Patient will perform toilet transfers with assist, with/without cues using equipment (OT) Outcome: Completed/Met   Problem: RH Tub/Shower Transfers Goal: LTG Patient will perform tub/shower transfers w/assist (OT) Description: LTG: Patient will perform tub/shower transfers with assist, with/without cues using equipment (OT) Outcome: Completed/Met

## 2022-08-26 NOTE — Progress Notes (Signed)
Speech Language Pathology Discharge Summary  Patient Details  Name: Carlos Shields MRN: [id number] Date of Birth: 06-01-1931  Date of Discharge from SLP service:August 26, 2022  Today's Date: 08/26/2022 SLP Individual Time: [id number] SLP Individual Time Calculation (min): 26 min   Skilled Therapeutic Interventions:  Skilled treatment session focused on cognitive goals. Upon arrival, patient was awake in the wheelchair and was propelling himself around the room. Patient reported he was just "looking around" and agreeable to treatment session. SLP facilitated session by re-administering the [facility] Mental Status Examination (SLUMS). Patient scored 12/26 points (score adjusted as patient unable to utilize pencil to draw a clock) with a score of 23 or above considered normal. Patient's score has improved from initial evaluation in which he scored 4/26 points. Patient continues to demonstrate deficits in problem solving, attention, and memory. Patient educated on results and verbalized understanding. Patient left upright in wheelchair with alarm on and all needs within reach.     Patient has met 5 of 5 long term goals.  Patient to discharge at overall Supervision;Min level.   Reasons goals not met: N/A  Clinical Impression/Discharge Summary: Patient has made functional gains and has met 4 of 4 LTGs this admission. Currently, patient is consuming Dys. 1 textures (per his preference) with thin liquids via cup with minimal overt s/s of aspiration and overall supervision level verbal cues for use of swallowing compensatory strategies. Patient also requires overall Min verbal cues to complete functional and familiar tasks safely in regards to basic problem solving, recall with use of external aids and emergent awareness. Patient and family education is complete and patient will discharge home with 24 hour supervision from family. Patient would benefit from f/u SLP services to maximize his  swallowing and cognitive functioning in order to reduce caregiver burden.   Care Partner:  Caregiver Able to Provide Assistance: Yes  Type of Caregiver Assistance: Physical;Cognitive  Recommendation:  24 hour supervision/assistance;Outpatient SLP  Rationale for SLP Follow Up: Maximize cognitive function and independence;Maximize swallowing safety;Reduce caregiver burden   Equipment: N/A   Reasons for discharge: Discharged from hospital;Treatment goals met   Patient/Family Agrees with Progress Made and Goals Achieved: Yes    Tomoki Lucken 08/26/2022, 6:25 AM

## 2022-08-26 NOTE — Progress Notes (Signed)
Occupational Therapy Discharge Summary  Patient Details  Name: Famous Speller MRN: [id number] Date of Birth: June 14, 1931  Date of Discharge from OT service:August 26, 2022  Patient has met 9 of 11 long term goals due to improved activity tolerance, improved balance, postural control, ability to compensate for deficits, functional use of  RIGHT upper and RIGHT lower extremity, improved attention, improved awareness, and improved coordination.  Patient to discharge at overall Supervision/CGA level.  Patient's care partner is independent to provide the necessary physical and cognitive assistance at discharge.    Reasons goals not met: Patient still requires CGA for dynamic balance which affects LB dressing and toileting tasks.   Recommendation:  Patient will benefit from ongoing skilled OT services in outpatient setting to continue to advance functional skills in the area of BADL, Reduce care partner burden, and functional use of R UE.  Marland Kitchen  Equipment: 3-in-1 BSC  Reasons for discharge: treatment goals met and discharge from hospital  Patient/family agrees with progress made and goals achieved: Yes  OT Discharge Precautions/Restrictions  Precautions Precautions: Fall Precaution Comments: incontinent, R hemi, seizure Restrictions Weight Bearing Restrictions: No Pain  Denies pain ADL ADL Eating: Set up Grooming: Supervision/safety Upper Body Bathing: Supervision/safety Lower Body Bathing: Supervision/safety Where Assessed-Lower Body Bathing: Standing at sink, Sitting at sink Upper Body Dressing: Supervision/safety Lower Body Dressing: Supervision/safety, Contact guard Where Assessed-Lower Body Dressing: Standing at sink, Sitting at sink Toileting: Contact guard Toilet Transfer: Moderate assistance Toilet Transfer Method: Ambulating Tub/Shower Transfer: Close supervison Perception  Perception: Impaired Praxis Praxis: Impaired Praxis Impairment Details: Motor planning Praxis-Other  Comments: improved since eval Cognition Cognition Overall Cognitive Status: History of cognitive impairments - at baseline Arousal/Alertness: Awake/alert Orientation Level: Situation;Person;Place Person: Oriented Place: Oriented Situation: Oriented Memory: Impaired Memory Impairment: Storage deficit;Retrieval deficit Awareness Impairment: Emergent impairment Problem Solving: Impaired Safety/Judgment: Impaired Brief Interview for Mental Status (BIMS) Repetition of Three Words (First Attempt): 3 Temporal Orientation: Year: Missed by more than 5 years Temporal Orientation: Month: Accurate within 5 days Temporal Orientation: Day: Correct Recall: "Sock": No, could not recall Recall: "Blue": Yes, after cueing ("a color") BIMS Summary Score: 99 Sensation Sensation Light Touch: Appears Intact Proprioception: Impaired by gross assessment Additional Comments: posterior bias Coordination Gross Motor Movements are Fluid and Coordinated: No Fine Motor Movements are Fluid and Coordinated: No Coordination and Movement Description: grossly uncoordinated 2/2 right hemi Finger Nose Finger Test: R UE dysmetria Heel Shin Test: R LE dysmetria Motor  Motor Motor: Hemiplegia Motor - Discharge Observations: R hemi, but improving Mobility  Bed Mobility Bed Mobility: Rolling Right;Rolling Left;Supine to Sit;Sit to Supine Rolling Right: Supervision/verbal cueing Rolling Left: Supervision/Verbal cueing Supine to Sit: Supervision/Verbal cueing Sit to Supine: Supervision/Verbal cueing Transfers Sit to Stand: Supervision/Verbal cueing Stand to Sit: Supervision/Verbal cueing  Trunk/Postural Assessment  Cervical Assessment Cervical Assessment: Exceptions to [facility] (Forward head) Thoracic Assessment Thoracic Assessment: Exceptions to [facility] (Posterior bias) Lumbar Assessment Lumbar Assessment: Exceptions to [facility] (Posterior bias with posterior pelvi tilt) Postural Control Trunk Control: Delayed and  inadequate with posterior bias  Balance Balance Balance Assessed: Yes Static Sitting Balance Static Sitting - Balance Support: Feet supported Static Sitting - Level of Assistance: 6: Modified independent (Device/Increase time) Dynamic Sitting Balance Dynamic Sitting - Balance Support: Feet unsupported;During functional activity Dynamic Sitting - Level of Assistance: 5: Stand by assistance Dynamic Sitting - Balance Activities: Lateral lean/weight shifting;Forward lean/weight shifting;Reaching for objects Sitting balance - Comments: Pt able to maintain static sitting balance EOB in preparation for standing. Static Standing Balance  Static Standing - Balance Support: Bilateral upper extremity supported Static Standing - Level of Assistance: 5: Stand by assistance Dynamic Standing Balance Dynamic Standing - Balance Support: During functional activity;Bilateral upper extremity supported Dynamic Standing - Level of Assistance: 5: Stand by assistance;4: Min assist (CGA) Dynamic Standing - Balance Activities: Lateral lean/weight shifting Extremity/Trunk Assessment RUE Assessment RUE Assessment: Exceptions to [facility] Active Range of Motion (AROM) Comments: limited at R [place] General Strength Comments: 3+-4/5 overall LUE Assessment LUE Assessment: Within Functional Limits   Daneen Schick Divine Hansley 08/26/2022, 2:16 PM

## 2022-08-27 DIAGNOSIS — I6359 Cerebral infarction due to unspecified occlusion or stenosis of other cerebral artery: Secondary | ICD-10-CM

## 2022-08-27 NOTE — Progress Notes (Signed)
Inpatient Rehabilitation Care Coordinator Discharge Note   Patient Details  Name: Cobra Depass MRN: [id number] Date of Birth: 31-Jul-1930   Discharge location: D/c to home  Length of Stay: 12 days  Discharge activity level: Supervision  Home/community participation: Limited  Patient response [id number] Literacy - How often do you need to have someone help you when you read instructions, pamphlets, or other written material from your doctor or pharmacy?: Never  Patient response [id number] Isolation - How often do you feel lonely or isolated from those around you?: Never  Services provided included: MD, RD, PT, SLP, RN, TR, Pharmacy, Neuropsych, SW, CM, OT  Financial Services:  Charity fundraiser Utilized: [place] [employer]  Choices offered to/list presented to: patient and daughter  Follow-up services arranged:  Outpatient, DME    Outpatient Servicies: [facility] for outpatient for PT/OT/SLP DME : [place] [facility]    Patient response to transportation need: Is the patient able to respond to transportation needs?: Yes In the past 12 months, has lack of transportation kept you from medical appointments or from getting medications?: No In the past 12 months, has lack of transportation kept you from meetings, work, or from getting things needed for daily living?: No   Comments (or additional information):  Patient/Family verbalized understanding of follow-up arrangements:  Yes  Individual responsible for coordination of the follow-up plan: contact [name] [phone]  Confirmed correct DME delivered: Rana Snare 08/27/2022    Rana Snare

## 2022-08-27 NOTE — Progress Notes (Signed)
PROGRESS NOTE   Subjective/Complaints:  No acute complaints. No events overnight.  No questions regarding discharge  ROS: Denies fevers, chills, N/V, abdominal pain, constipation, diarrhea, SOB, cough, chest pain, new weakness or paraesthesias.     Objective:   No results found. Recent Labs    08/26/22 0654  WBC 7.0  HGB 8.5*  HCT 25.7*  PLT 252    Recent Labs    08/26/22 0654  NA 136  K 4.4  CL 105  CO2 25  GLUCOSE 92  BUN 42*  CREATININE 1.80*  CALCIUM 8.8*     Intake/Output Summary (Last 24 hours) at 08/27/2022 2023 Last data filed at 08/27/2022 0900 Gross per 24 hour  Intake 454 ml  Output --  Net 454 ml         Physical Exam: Vital Signs Blood pressure 139/73, pulse 79, temperature 98 F (36.7 C), resp. rate 17, height 5' 4"$  (1.626 m), weight 54.2 kg, SpO2 99 %.  Constitutional: No distress . Vital signs reviewed. Working with therapy in gym HEENT: NCAT, conjugate gaze, oral membranes moist Neck: supple Cardiovascular: RRR without murmur. No JVD    Respiratory/Chest: CTA Bilaterally without wheezes or rales. Normal effort   .  Good air movement GI/Abdomen: BS +, non-tender, non-distended Ext: no clubbing, cyanosis, or edema Psych: pleasant and cooperative  Skin: TCAR site L neck c.d.I  MSK: Antigravity and against resistance in all 4 extremities in WC, 5-/5   Neurologic exam:  Cognition: AAO to person, place ;  time with option - stable  + significant memory deficits   Coordination: +very mild bilateral UE intention tremor - less today    Assessment/Plan: 1. Functional deficits which require 3+ hours per day of interdisciplinary therapy in a comprehensive inpatient rehab setting. Physiatrist is providing close team supervision and 24 hour management of active medical problems listed below. Physiatrist and rehab team continue to assess barriers to discharge/monitor patient progress  toward functional and medical goals  Care Tool:  Bathing    Body parts bathed by patient: Right arm, Left arm, Abdomen, Front perineal area, Chest, Buttocks, Right upper leg, Left upper leg, Left lower leg, Face, Right lower leg   Body parts bathed by helper: Buttocks, Right lower leg, Left lower leg     Bathing assist Assist Level: Supervision/Verbal cueing     Upper Body Dressing/Undressing Upper body dressing   What is the patient wearing?: Pull over shirt    Upper body assist Assist Level: Supervision/Verbal cueing    Lower Body Dressing/Undressing Lower body dressing      What is the patient wearing?: Pants     Lower body assist Assist for lower body dressing: Contact Guard/Touching assist     Toileting Toileting    Toileting assist Assist for toileting: Contact Guard/Touching assist     Transfers Chair/bed transfer  Transfers assist     Chair/bed transfer assist level: Supervision/Verbal cueing     Locomotion Ambulation   Ambulation assist      Assist level: Supervision/Verbal cueing Assistive device: Walker-rolling Max distance: >150'   Walk 10 feet activity   Assist     Assist level: Supervision/Verbal cueing Assistive device:  Walker-rolling   Walk 50 feet activity   Assist    Assist level: Supervision/Verbal cueing Assistive device: Walker-rolling    Walk 150 feet activity   Assist    Assist level: Supervision/Verbal cueing Assistive device: Walker-rolling    Walk 10 feet on uneven surface  activity   Assist     Assist level: Supervision/Verbal cueing Assistive device: Walker-rolling   Wheelchair     Assist Is the patient using a wheelchair?: Yes (transport only) Type of Wheelchair: Manual    Wheelchair assist level: Maximal Assistance - Patient 25 - 49% Max wheelchair distance: 5 ft    Wheelchair 50 feet with 2 turns activity    Assist        Assist Level: Total Assistance - Patient < 25%    Wheelchair 150 feet activity     Assist      Assist Level: Total Assistance - Patient < 25%   Blood pressure 139/73, pulse 79, temperature 98 F (36.7 C), resp. rate 17, height 5' 4"$  (1.626 m), weight 54.2 kg, SpO2 99 %.  Medical Problem List and Plan: 1. Functional deficits secondary to multiple small acute ischemic CVA infarcts involving the left basal ganglia, left frontal, right occipital cortex and adjacent white matter              -patient may shower             -ELOS/Goals: 10-12 days, supervision/CGA PT, supervision OT, supervision SLP; DC 2/16  - Stable for discharge home   - Notable significant cognitive deficits, OP documentation of ?Parkinsonism d/t shuffling gait, tremor, and memory deficits; was not worked up.   2/9: Exam not consistent with parkinsonism, suspect generalized dementia. SLUMS 4/26 on intake. Will start memantine 5 mg daily x7 days, then increase to 5 mg BID as tolerated.  -> 2/12 cognitive speed appears improved; continue current dose  2.  Antithrombotics: -DVT/anticoagulation: Lovenox 30 mg daily  -antiplatelet therapy: DAPT ASA 19m QD + Plavix 745mQD x3 weeks then Plavix alone (~08/29/22) 3. Pain Management: tylenol PRN, oxy 5 mg Q4H PRN.  4. Mood/Behavior/Sleep: Trazodone 25-50 mg PRN             -antipsychotic agents: none 5. Neuropsych/cognition: This patient is capable of making decisions on his own behalf. 6. Skin/Wound Care: Standard skin assessments, wound care.  - Would encourage removal of condom catheter to avoid skin  - removed 2/6; incontinent, PVRs QID and times toiletting Q4H  -2/11 PVRs not elevated, occasional incontinence, appears improved    7. Fluids/Electrolytes/Nutrition: Dysphagia. Heart healthy diet, thins.    - Note OP workup for oropharyngeal dysphagia 06/2022 - Admission labs stable, mild decrease in NA - repeat wednesday  - 2/5 encourage fluids  - 2/6: Full supervision with D2 thins per SLP d/t choking on food,  impulsive eating. Meds crushed. Per SLP was modifying diet at home PTA.    - 2/7: Downgraded to D1; barium swallow pending. Encourage PO intakes today, repeat labs 2/9.   - 2/9: Barium swallow: patient's swallow appeared grossly functional for consistencies tested, will f/u for potential impact on more solid consistencies given clinician presentation at the bedside. Recommend patient continue current diet of Dys. 2 textures with thin liquids with full supervision to maximize safety with overall PO intake.   -2/10 SLP recommends continuation of Dys 1 textures diet per patient request - tolerating   - 2/13 Patient happy on pureed and thins; continue    8. L internal Carotid  Artery stenosis s/p TCAR 08/12/22, continue ASA/Plavix/Crestor             -f/up in 4 weeks with carotid duplex with Dr. Donzetta Matters  9. Seizures: continue Keppra 578m BID - change to tablets 2/15  10. HTN: continue Norvasc 533mQD, held home Metoprolol 5057mID during hospitalization but restarted at 12.5mg40mD -Per neurology, allowed for permissive HTN (ok if <220/120) but gradually normalize by 08/14/22, long term normotensive goal    08/27/2022    9:43 AM 08/27/2022    9:41 AM 08/27/2022    4:16 AM  Vitals with BMI  Systolic 139 [id number] [id number] 73 72 55  Pulse 79 79 69    -2/11-13 well controlled  11. Acute on Chronic CKD stage 3a: baseline Cr ~1.2, peaked at 2.1, renal U/S 08/06/22 showing chronic medical renal disease but no hydronephrosis             -Avoid nephrotoxic agents, monitoring UOP   - 2/5 labs: Cr 1.6; stable, BUN sl up---push fluids - Cr improved 1.5 -> 1.69 2/10 -> 1.67 2/12 with BUN increase 32-> 44   - Add IVF 1 L at 75 cc/hr overnight; recheck BMP in AM   - 2/13: BMP unchanged, ? If IVF received, will d.w nursing   - 2/14: received IVF, will repeat labs in AM; if uptrending BUN may need to remove remove protein supplements and obtain fobt. Cr appears stable 1.6   - 2/15: cR 1.8 today, BUN down/stable. On  chart review, has been between 1.5-1.9 entire hospitalization; good PO intakes, no obvious nephrotoxic meds; likely represents new baseline.   12. Chronic normocytic anemia: baseline hgb 9-10, monitor CBC - stable             -Hgb down to 8.2 after TCAR, stable at 8.0 on 08/13/22; 8.3 2/4; 9.3 2/7-> 9.2 9/12  13. Abnormal TSH: TSH 4.9 on admission 08/05/22, free T4 0.88; WNL.    - F/u with PCP 14. Hyponatremia: improved during hospitalization with hydration, monitor BMP   - Decreased 134 -> 132 2/4 -> 138 2/7 -> 141 2/12 -> 137 2/13 -> 136 2/15; stable     LOS: 13 days [place]/2024, 8:23 PM

## 2022-08-27 NOTE — Progress Notes (Signed)
Nursing care plan and nursing education completed for discharge.

## 2022-08-27 NOTE — Progress Notes (Signed)
Patient discharged to home with family. Discharge instructions give to patient and family by PA Dan A. Patient belongings were packed up by family, nurse and nurse tech. Patient will pick up medications form [facility] pharmacy on the way out of facility. Patient was escorted out of facility by nurse and nurse tech via wheelchair. Sanda Linger, RN

## 2022-08-27 NOTE — Progress Notes (Signed)
Inpatient Rehabilitation Discharge Medication Review by a Pharmacist  A complete drug regimen review was completed for this patient to identify any potential clinically significant medication issues.  High Risk Drug Classes Is patient taking? Indication by Medication  Antipsychotic No   Anticoagulant No   Antibiotic No   Opioid Yes Oxycodone-pain  Antiplatelet Yes Asa, plavix x 3 weeks then Plavix alone for stroke.  ASA stops on 08/29/22.   Hypoglycemics/insulin No   Vasoactive Medication Yes Amlodipine, metoprolol - HTN  Chemotherapy No   Other Yes Levetiracetam - seizures Memantine-suspected dementia Pantoprazole - GERD Rosuvastatin - HLD/stroke Acetaminophen- pain Multivitamin-supplement Miralax- constipation     Type of Medication Issue Identified Description of Issue Recommendation(s)  Drug Interaction(s) (clinically significant)     Duplicate Therapy     Allergy     No Medication Administration End Date     Incorrect Dose     Additional Drug Therapy Needed     Significant med changes from prior encounter (inform family/care partners about these prior to discharge).    Other       Clinically significant medication issues were identified that warrant physician communication and completion of prescribed/recommended actions by midnight of the next day:  No  Pharmacy comments:   PA added Special instructions to AVS:  Continue aspirin 81 mg daily and Plavix 75 mg daily until 08/29/2022 then Plavix alone   Time spent performing this drug regimen review (minutes):  20 minutes  Nicole Cella, RPh Clinical Pharmacist  08/27/2022

## 2022-09-01 ENCOUNTER — Other Ambulatory Visit: Payer: Self-pay | Admitting: *Deleted

## 2022-09-01 DIAGNOSIS — I6529 Occlusion and stenosis of unspecified carotid artery: Secondary | ICD-10-CM

## 2022-09-08 ENCOUNTER — Encounter: Payer: Medicare Other | Attending: Registered Nurse | Admitting: Registered Nurse

## 2022-09-08 ENCOUNTER — Encounter: Payer: Self-pay | Admitting: Registered Nurse

## 2022-09-08 VITALS — BP 116/63 | HR 91 | Ht 64.0 in | Wt 118.0 lb

## 2022-09-08 DIAGNOSIS — R569 Unspecified convulsions: Secondary | ICD-10-CM

## 2022-09-08 DIAGNOSIS — I639 Cerebral infarction, unspecified: Secondary | ICD-10-CM

## 2022-09-08 DIAGNOSIS — I6522 Occlusion and stenosis of left carotid artery: Secondary | ICD-10-CM

## 2022-09-08 DIAGNOSIS — I10 Essential (primary) hypertension: Secondary | ICD-10-CM | POA: Diagnosis not present

## 2022-09-08 MED ORDER — CLOPIDOGREL BISULFATE 75 MG PO TABS: 75.0000 mg | ORAL_TABLET | Freq: Every day | ORAL | 0 refills | Status: DC

## 2022-09-08 NOTE — Patient Instructions (Signed)
Call your Primary Care doctor to schedule Hospital Follow Up appointment and Medication refills.   Call with any questions: [phone]

## 2022-09-08 NOTE — Therapy (Signed)
OUTPATIENT OCCUPATIONAL THERAPY NEURO EVALUATION  Patient Name: Carlos Shields MRN: [id number] DOB:[birth year], 89 y.o., male Today's Date: 09/15/2022  PCP: Raelyn Number, PA REFERRING PROVIDER: Cathlyn Parsons, PA-C   END OF SESSION:  OT End of Session - 09/15/22 1149     Visit Number 1    Number of Visits 12    Date for OT Re-Evaluation 11/05/22    Authorization Type [employer]    OT Start Time [id number]    OT Stop Time 1232    OT Time Calculation (min) 43 min    Activity Tolerance Patient tolerated treatment well;No increased pain;Patient limited by lethargy;Patient limited by fatigue    Behavior During Therapy [facility] for tasks assessed/performed             Past Medical History:  Diagnosis Date   CKD (chronic kidney disease) stage 3, GFR 30-59 ml/min (HCC)    Gait abnormality    Hypertension    Pancreatitis    Seizures (HCC)    Venous insufficiency    Past Surgical History:  Procedure Laterality Date   HERNIA REPAIR     TRANSCAROTID ARTERY REVASCULARIZATION  Left 08/12/2022   Procedure: Transcarotid Artery Revascularization;  Surgeon: Waynetta Sandy, MD;  Location: [place];  Service: Vascular;  Laterality: Left;   Patient Active Problem List   Diagnosis Date Noted   CVA (cerebrovascular accident) ([place]) 08/14/2022   Acute-on-chronic kidney injury ([place]) 08/05/2022   CVA (cerebral vascular accident) ([place]) 08/05/2022   Dysphagia 08/05/2022   Abnormal TSH 123456   Acute metabolic encephalopathy 123456   CKD (chronic kidney disease), stage III ([place]) 04/26/2021   Weakness generalized 04/26/2021   Weakness 04/25/2021   Syncope 01/13/2012   Anemia 12/09/2011   Seizures ([place]) 12/08/2011   HTN (hypertension) 12/08/2011   Encephalopathy 12/08/2011    ONSET DATE: acute on chronic (discovered mini-strokes on 08/05/22)   REFERRING DIAG: I63.09 (ICD-10-CM) - Cerebrovascular accident (CVA) due to thrombosis of other precerebral artery ([employer] [employer])   THERAPY DIAG:  Other  lack of coordination  Muscle weakness (generalized)  Rationale for Evaluation and Treatment: Rehabilitation  SUBJECTIVE:   SUBJECTIVE STATEMENT: He is accompanied by his daughter. He was having some falls, doing worse over time, until daughter took him to the hospital on 08/05/22.  It was determined there that he had had some subacute infarcts in the left lobe of the brain and basal ganglia.  She states he's had a bit worse function now than previously, and she moved him into her home as she was concerned about his safety living alone.  She does the IADLs and assists with some BADL's.  Pt accompanied by:  daughter  PERTINENT HISTORY: Per hospital D/C note 08/25/22: "89 y.o. right-handed male with history of seizure maintained on Keppra, hypertension, CKD stage III, pancreatitis, venous insufficiency and dysphagia who was admitted 08/05/2022 for right-sided weakness, incontinence, seizure-like activity and fall...MRI of the brain 08/05/2022 which revealed multiple small acute infarcts involving the left basal ganglia, left frontal, parietal and occipital cortex and adjacent white matter with slight edema but no mass effect...underwent left transcarotid artery revascularization 08/12/2022 per Dr. Donzetta Matters." He was admitted to inpatient rehab and recently discharged on 08/27/22, and the hospital OT left this note about function at discharge: "Patient to discharge at overall Supervision/CGA level.  Patient's care partner is independent to provide the necessary physical and cognitive assistance at discharge.Marland KitchenMarland KitchenPatient still requires CGA for dynamic balance which affects LB dressing and toileting tasks.Marland KitchenMarland KitchenPatient will benefit from ongoing skilled OT  services in outpatient setting to continue to advance functional skills in the area of BADL, Reduce care partner burden, and functional use of R UE."  PRECAUTIONS: Fall; WEIGHT BEARING RESTRICTIONS: No  PAIN:  Are you having pain? Not now at rest Rating: 0/10 at rest  now, up to 0/10 at worst in past week   FALLS: Has patient fallen in last 6 months? Yes. Number of falls "about 5 times"   LIVING ENVIRONMENT: Lives with: lives with their daughter Lives in: House/apartment Stairs: No Has following equipment at home: Environmental consultant - 2 wheeled, Electronics engineer, and adapted toilet  PLOF: Independent with basic ADLs, Independent with household mobility with device, and Independent with community mobility with device  PATIENT GOALS: to improve strength, motion, coordination in Rt side and with overall function, if possible    OBJECTIVE: (All objective assessments below are from initial evaluation on: 09/15/22 unless otherwise specified.)    HAND DOMINANCE: Right   ADLs: Overall ADLs: Daughter states she gives Mod A for dressing, has adapted toilet with rails and toilets himself, he sponge bathes with supervision, uses a walker at home and when out for mobility. He used to use the microwave, but hasn't after hospitalization. He now wets the bed more, which did not used to be so problematic.   FUNCTIONAL OUTCOME MEASURES: Eval: Quck DASH 25% impairment today  (Higher % Score  =  More Impairment)   UPPER EXTREMITY ROM     Shoulder to Wrist AROM Right eval Left eval  Shoulder flexion 90 130  Shoulder abduction 90 130  Elbow flexion 130 155  Elbow extension 0 0  Forearm supination WFL WFL  Forearm pronation  [facility]  Wrist flexion Tight but functional Tight but functional  Wrist extension Tight but functional Tight but functional  (Blank rows = not tested)   Hand AROM Right eval Left eval  Full Fist Ability (or Gap to Distal Palmar Crease) full full  Thumb Opposition  (Kapandji Scale)  6 6  (Blank rows = not tested)   UPPER EXTREMITY MMT:     MMT Right 09/15/22 Left 09/15/22  Shoulder flexion 4/5 5/5  Elbow flexion 4/5 4+/5  Elbow extension 4/5 4+/5  (Blank rows = not tested)  HAND FUNCTION: Eval: Observed weakness in affected hand.  Grip  strength Right: 48 lbs, Left: 57 lbs   COORDINATION: Eval: Observed coordination impairments with affected hand. Box and Blocks Test: Rt: 20 blocks, Lt 21 blocks today   SENSATION: Eval:  Light touch intact today    COGNITION: Eval: Overall cognitive status: He was quiet and seemingly dependent on his daughter to answer most questions for him. He seemed to have at least mild recall/memory issues as well, and small problems following simple commands at times.   OBSERVATIONS:   Eval: Overall he presents as somewhat more stiff, slower coordination, and weaker in dominant right upper extremity today.   TODAY'S TREATMENT:  Post-evaluation treatment: OT recommends to the family that he should perform as much seated functional activity as is appropriate and safe for him (like folding laundry and picking the stems off of strawberries, etc.).  It is important to give him purpose and function to keep his body healthy and his mind healthy.  Next OT gives the following home exercise program focused largely on the shoulder and upper body today to improve range of motion and strength and coordination hopefully.  They were educated for him to do this 2 or 3 times a day as  tolerated without adding any pain in a seated position that is safe for him.  He performs back with no pains and some fatigue.  Exercises - Seated Shoulder Flexion AAROM with Dowel  - 4-6 x daily - 1 sets - 10-15 reps - Seated Shoulder Abduction AAROM with Dowel  - 4-6 x daily - 1 sets - 10-15 reps - Seated Single Arm Shoulder Extension  - 4-6 x daily - 1 sets - 10-15 reps - Seated Lifting Hands Behind Back  - 4-6 x daily - 1 sets - 10-15 reps  PATIENT EDUCATION: Education details: See tx section above for details  Person educated: Patient Education method: Verbal Instruction, Teach back, Handouts  Education comprehension: States and demonstrates understanding, Additional Education required    HOME EXERCISE PROGRAM: Access Code:  [email or web address] URL: [email or web address] Date: 09/15/2022 Prepared by: Benito Mccreedy   GOALS: Goals reviewed with patient? Yes   SHORT TERM GOALS: (STG required if POC>30 days) Target Date: 10/01/22  Pt will demo/state understanding of initial HEP to improve pain levels and prerequisite motion. Goal status: INITIAL   LONG TERM GOALS: Target Date: 11/05/22  Pt will improve functional ability by decreased impairment per Quick DASH assessment from 25% to 15% or better, for better quality of life. Goal status: INITIAL  2.  Pt will improve grip strength in Rt hand hand from 48lbs to at least 55lbs for functional use at home and in IADLs. Goal status: INITIAL  3.  Pt will improve A/ROM in Rt sh flex and abd from 90* each to at least 120* each, to have functional motion for tasks like reach and grasp.  Goal status: INITIAL  4.  Pt will improve strength in Rt elbow from 4/5 MMT to at least 4+/5 MMT to have increased functional ability to carry out selfcare and higher-level tasks with less difficulty. Goal status: INITIAL  5.  Pt will improve coordination skills in b/l UEs, as seen by better score (at least 30 blocks in both UEs) on BBT testing to have increased functional ability to carry out fine motor tasks (fasteners, etc.) and more complex, coordinated IADLs as able.  Goal status: INITIAL   ASSESSMENT:  CLINICAL IMPRESSION: Patient is a 89 y.o. male who was seen today for occupational therapy evaluation for gross general upper body weakness and stiffness more so on the right arm than the left arm after recent hospitalization and CVA infarcts.  He has also had a recent decrease in functional mobility which puts increased strain on his daughter who is his caregiver.  He will benefit from outpatient occupational therapy to increase quality of life.   PERFORMANCE DEFICITS: in functional skills including ADLs, IADLs, coordination, dexterity, ROM, strength, flexibility, Fine  motor control, mobility, balance, body mechanics, endurance, decreased knowledge of precautions, and UE functional use, cognitive skills including attention, energy/drive, memory, problem solving, safety awareness, and thought, and psychosocial skills including coping strategies, environmental adaptation, habits, and routines and behaviors.   IMPAIRMENTS: are limiting patient from ADLs, IADLs, leisure, and social participation.   CO-MORBIDITIES: may have co-morbidities  that affects occupational performance. Patient will benefit from skilled OT to address above impairments and improve overall function.  MODIFICATION OR ASSISTANCE TO COMPLETE EVALUATION: No modification of tasks or assist necessary to complete an evaluation.  OT OCCUPATIONAL PROFILE AND HISTORY: Problem focused assessment: Including review of records relating to presenting problem.  CLINICAL DECISION MAKING: LOW - limited treatment options, no task modification necessary  REHAB POTENTIAL: Good  EVALUATION COMPLEXITY: Low  PLAN:  OT FREQUENCY: 2x/week  OT DURATION: 8 weeks  PLANNED INTERVENTIONS: self care/ADL training, therapeutic exercise, therapeutic activity, neuromuscular re-education, manual therapy, passive range of motion, functional mobility training, compression bandaging, moist heat, cryotherapy, contrast bath, patient/family education, cognitive remediation/compensation, visual/perceptual remediation/compensation, energy conservation, coping strategies training, and DME and/or AE instructions  RECOMMENDED OTHER SERVICES: he is getting PT as well for balance/gait, etc.   CONSULTED AND AGREED WITH PLAN OF CARE: Patient and family member/caregiver  PLAN FOR NEXT SESSION:  Check on initial home exercise program and recommendations, continue to perform functional activities to improve fine motor skills and coordination.  Perform strengthening and more distal upper body exercises and activities as  needed.   Benito Mccreedy, OT 09/15/2022, 5:19 PM

## 2022-09-08 NOTE — Progress Notes (Signed)
Subjective:    Patient ID: Carlos Shields, male    DOB: [birth year], 89 y.o.   MRN: [id number]  HPI: Carlos Shields is a 89 y.o. male who is here for [place] appointment for follow up of his CVA ( Cerebrovascular accident), Left Internal Carotid Artery Stenosis, Essential Hypertension and Seizures. He was brought to [facility] on 08/05/2022 for right- sided weakness. Dr Flossie Buffy H&P Note: HPI: Carlos Shields is a 89 y.o. male with medical history significant of seizure on antiepileptics, CVA, hypertension, CKD stage IIIa, dysphagia who presents with right-sided weakness.   Patient's daughter provides history since pt was alert and oriented to self and was halfway falling out of bed during my evaluation. Pt stays with his daughter due to hx of fall. Normally ambulate with walker. Today he wet his bed which is unusual for him. Daughter then saw him falling out of bed and seems to be dragging his leg. Right hand appears "twisted" so daughter thought he was having a seizure and called EMS. All of this happened around 1-2pm. He had missed a few doses of his Keppra due to issues with pharmacy and ran out aspirin for a week. Son also just died last week.    Code stroke initially activated at 1426 with negative CT head. No TPA or thrombectomy as he was outside window. Neurology Dr. Leonel Ramsay evaluated over tele-neuro and MRI brain was recommended. Additional Keppra was given as pt had missed several doses and concerned this could be breakthrough seizure. However MRI brain was later revealing for multiple small acute infarcts involving the left basal ganglia, left frontal, parietal and occipital cortex and adjacent white matter. Slight edema without mass effect.   CT Head: WO Contrast:  MPRESSION: 1. No evidence of acute abnormality. ASPECTS is 10. 2. Similar chronic microvascular ischemic disease.   MR Brain: WO Contrast:  IMPRESSION: 1. Multiple small acute infarcts involving the left basal ganglia, left frontal,  parietal and occipital cortex and adjacent white matter. Slight edema without mass effect. 2. Advanced brain atrophy (ICD10-G31.9). and chronic microvascular ischemic disease.  MRA:  IMPRESSION: 1. Negative intracranial MRA for large vessel occlusion. 2. Intracranial atherosclerotic disease with associated moderate stenosis at the left carotid siphon, with additional severe distal left P3 stenosis. No other proximal high-grade or correctable Stenosis  Vascular Surgery Consulted  On 08/12/2022 he underwent: Transcarotid Artery Revascularization : Dr Donzetta Matters   He resumed his  Aspirin and was started on Plavix daily for three weeks, then Plavix alone.   Mr. Murk was admitted to inpatient rehabilitation on 08/14/2022 and discharged home on 08/27/2022. He is scheduled for Outpatient Therapy with [facility]. He denies any pain. He rates his pain 0. Also reports he has a good appetite.   Daughter in Room      Pain Inventory Average Pain 0 Pain Right Now 0 My pain is  no pain  LOCATION OF PAIN  no pain  BOWEL Number of stools per week: 7 Oral laxative use Yes  Type of laxative Miralax   BLADDER Normal I   Mobility walk with assistance use a walker ability to climb steps?  yes do you drive?  no Do you have any goals in this area?  yes  Function retired I need assistance with the following:  bathing, meal prep, household duties, and shopping Do you have any goals in this area?  yes  Neuro/Psych weakness trouble walking  Prior Studies Any changes since last visit?  no  Physicians involved  in your care Any changes since last visit?  no   Family History  Problem Relation Age of Onset   Hypertension Other    Diabetes Other    Asthma Other    Social History   Socioeconomic History   Marital status: Divorced    Spouse name: Not on file   Number of children: 2   Years of education: Not on file   Highest education level: Not on file   Occupational History   Occupation: retired  Tobacco Use   Smoking status: Former    Types: Cigars   Smokeless tobacco: Never  Scientific laboratory technician Use: Never used  Substance and Sexual Activity   Alcohol use: No   Drug use: No   Sexual activity: Never  Other Topics Concern   Not on file  Social History Narrative   Not on file   Social Determinants of Health   Financial Resource Strain: Not on file  Food Insecurity: No Food Insecurity (08/06/2022)   Hunger Vital Sign    Worried About Running Out of Food in the Last Year: Never true    [place] in the Last Year: Never true  Transportation Needs: No Transportation Needs (08/06/2022)   PRAPARE - Hydrologist (Medical): No    Lack of Transportation (Non-Medical): No  Physical Activity: Not on file  Stress: Not on file  Social Connections: Not on file   Past Surgical History:  Procedure Laterality Date   HERNIA REPAIR     TRANSCAROTID ARTERY REVASCULARIZATION  Left 08/12/2022   Procedure: Transcarotid Artery Revascularization;  Surgeon: Waynetta Sandy, MD;  Location: [place];  Service: Vascular;  Laterality: Left;   Past Medical History:  Diagnosis Date   CKD (chronic kidney disease) stage 3, GFR 30-59 ml/min (HCC)    Gait abnormality    Hypertension    Pancreatitis    Seizures ([place])    Venous insufficiency    There were no vitals taken for this visit.  Opioid Risk Score:   Fall Risk Score:  `1  Depression screen [facility] 2/9     09/08/2022   11:15 AM  Depression screen PHQ 2/9  Decreased Interest 2  Down, Depressed, Hopeless 0  PHQ - 2 Score 2  Altered sleeping 0  Tired, decreased energy 1  Change in appetite 0  Feeling bad or failure about yourself  2  Trouble concentrating 0  Moving slowly or fidgety/restless 2  Suicidal thoughts 0  PHQ-9 Score 7    Review of Systems  Musculoskeletal:  Positive for gait problem.  Neurological:  Positive for weakness.  All other  systems reviewed and are negative.     Objective:   Physical Exam Vitals and nursing note reviewed.  Constitutional:      Appearance: Normal appearance.  Cardiovascular:     Rate and Rhythm: Normal rate and regular rhythm.     Pulses: Normal pulses.     Heart sounds: Normal heart sounds.  Pulmonary:     Effort: Pulmonary effort is normal.     Breath sounds: Normal breath sounds.  Musculoskeletal:     Cervical back: Normal range of motion and neck supple.     Comments: Normal Muscle Bulk and Muscle Testing Reveals:  Upper Extremities: Full ROM and Muscle Strength 5/5  Lower Extremities: Full ROM and Muscle Strength 5/5 Arises from Table Slowly using walker for support Narrow Based  Gait     Skin:  General: Skin is warm and dry.  Neurological:     Mental Status: He is alert and oriented to person, place, and time.  Psychiatric:        Mood and Affect: Mood normal.        Behavior: Behavior normal.         Assessment & Plan:  1. CVA ( Cerebrovascular accident),: Continue Outpatient Therapy with [facility]. Continue current medication Regimen. He has a scheduled appointment with Neurology.  2. Left Internal Carotid Artery Stenosis: He underwent: on 08/12/2022: By Dr Donzetta Matters: Transcarotid Artery Revascularization . He has ascheduled appointment with Vascular. Continue current medication regimen. Continue to monitor.  3. Essential Hypertension: Continue current medication regimen. PCP Following. Continue to Monitor.  4. Seizures: Continue current medication regimen. Neurology Following. Continue to Monitor.   F/U with Dr Tressa Busman in 4- 6 weeks

## 2022-09-09 ENCOUNTER — Ambulatory Visit: Payer: Medicare Other | Admitting: Speech Pathology

## 2022-09-09 ENCOUNTER — Ambulatory Visit: Payer: Medicare Other | Attending: Physician Assistant | Admitting: Physical Therapy

## 2022-09-09 VITALS — BP 134/71

## 2022-09-09 DIAGNOSIS — R1310 Dysphagia, unspecified: Secondary | ICD-10-CM

## 2022-09-09 DIAGNOSIS — R2689 Other abnormalities of gait and mobility: Secondary | ICD-10-CM | POA: Diagnosis not present

## 2022-09-09 DIAGNOSIS — R278 Other lack of coordination: Secondary | ICD-10-CM

## 2022-09-09 DIAGNOSIS — R2681 Unsteadiness on feet: Secondary | ICD-10-CM | POA: Diagnosis not present

## 2022-09-09 DIAGNOSIS — I6309 Cerebral infarction due to thrombosis of other precerebral artery: Secondary | ICD-10-CM | POA: Insufficient documentation

## 2022-09-09 NOTE — Therapy (Signed)
OUTPATIENT PHYSICAL THERAPY NEURO EVALUATION   Patient Name: Carlos Shields MRN: [id number] DOB:30-Oct-1930, 89 y.o., male Today's Date: 09/09/2022   PCP: [name], [place] REFERRING PROVIDER: Cathlyn Parsons, PA-C  END OF SESSION:  PT End of Session - 09/09/22 1233     Visit Number 1    Number of Visits 17   Plus eval   Date for PT Re-Evaluation 11/04/22    Authorization Type U[employer] Medicare    Progress Note Due on Visit 10    PT Start Time 1231    PT Stop Time 1315    PT Time Calculation (min) 44 min    Equipment Utilized During Treatment Gait belt    Activity Tolerance Patient tolerated treatment well    Behavior During Therapy WFL for tasks assessed/performed             Past Medical History:  Diagnosis Date   CKD (chronic kidney disease) stage 3, GFR 30-59 ml/min (HCC)    Gait abnormality    Hypertension    Pancreatitis    Seizures ([place])    Venous insufficiency    Past Surgical History:  Procedure Laterality Date   HERNIA REPAIR     TRANSCAROTID ARTERY REVASCULARIZATION  Left 08/12/2022   Procedure: Transcarotid Artery Revascularization;  Surgeon: Waynetta Sandy, MD;  Location: [facility];  Service: Vascular;  Laterality: Left;   Patient Active Problem List   Diagnosis Date Noted   CVA (cerebrovascular accident) ([place]) 08/14/2022   Acute-on-chronic kidney injury ([place]) 08/05/2022   CVA (cerebral vascular accident) ([place]) 08/05/2022   Dysphagia 08/05/2022   Abnormal TSH 123456   Acute metabolic encephalopathy 123456   CKD (chronic kidney disease), stage III ([place]) 04/26/2021   Weakness generalized 04/26/2021   Weakness 04/25/2021   Syncope 01/13/2012   Anemia 12/09/2011   Seizures ([place]) 12/08/2011   HTN (hypertension) 12/08/2011   Encephalopathy 12/08/2011    ONSET DATE: 08/25/2022  REFERRING DIAG: I63.09 (ICD-10-CM) - Cerebrovascular accident (CVA) due to thrombosis of other precerebral artery ([place])  THERAPY DIAG:  Other abnormalities  of gait and mobility  Unsteadiness on feet  Other lack of coordination  Rationale for Evaluation and Treatment: Rehabilitation  SUBJECTIVE:                                                                                                                                                                                             SUBJECTIVE STATEMENT: Pt presents to clinic alone, using RW and had significant difficulty motor planning use of RW, requiring max cues for proper use of AD. Pt reports he lives with his daughter who helps  him with most ADLs. Pt HOH and demonstrated difficulty w/word-finding, so daughter (who arrived late) provided majority of subjective. Pt was using RW prior to most recent stroke, has a house and was living alone but daughter no longer thinks he is safe. Daughter reports she has been trying to find home care aide, provided her with information for [employer]ivia Ewing of [employer] with assistance providing home aides.   Pt accompanied by: self and daughter, Karie Kirks    PERTINENT HISTORY: He underwent MRI of the brain 08/05/2022 which revealed multiple small acute infarcts involving the left basal ganglia, left frontal, parietal and occipital cortex and adjacent white matter with slight edema but no mass effect. He was admitted and underwent MRI of the head 08/06/2022 which showed intracranial atherosclerotic disease with associated moderate stenosis at the left carotid siphon with additional severe distal L P3 stenosis. Vascular surgery consulted he underwent CTA of the neck 08/08/2022 which showed proximal left ICA stenosis 75% and 65% on the right and subsequently underwent left transcarotid artery revascularization 08/12/2022 per Dr. Donzetta Matters. PMH: Seizure maintained on Keppra, hypertension, CKD stage III, pancreatitis, venous insufficiency and dysphagia who was admitted 08/05/2022 for right-sided weakness, incontinence, seizure-like activity and fall.  PAIN:  Are you having pain?  No  VITALS Vitals:   09/09/22 1258  BP: 134/71     PRECAUTIONS: Fall and Other: seizure   WEIGHT BEARING RESTRICTIONS: No  FALLS: Has patient fallen in last 6 months? Yes. Number of falls 1 ( due to seizure?)   LIVING ENVIRONMENT: Lives with: lives with their daughter Lives in: House/apartment Stairs: No Has following equipment at home: Environmental consultant - 2 wheeled and shower chair  PLOF: Requires assistive device for independence, Needs assistance with ADLs, Needs assistance with homemaking, Needs assistance with gait, and Needs assistance with transfers  PATIENT GOALS: per daughter, pt would like to work on walking   OBJECTIVE:   DIAGNOSTIC FINDINGS: He underwent MRI of the brain 08/05/2022 which revealed multiple small acute infarcts involving the left basal ganglia, left frontal, parietal and occipital cortex and adjacent white matter with slight edema but no mass effect. He was admitted and underwent MRI of the head 08/06/2022 which showed intracranial atherosclerotic disease with associated moderate stenosis at the left carotid siphon with additional severe distal L P3 stenosis. Vascular surgery consulted he underwent CTA of the neck 08/08/2022 which showed proximal left ICA stenosis 75% and 65% on the right and subsequently underwent left transcarotid artery revascularization 08/12/2022 per Dr. Donzetta Matters  COGNITION: Overall cognitive status: Impaired and History of cognitive impairments - at baseline   SENSATION: Pt denies numbness/tingling   COORDINATION: Heel to shin test: WNL bilaterally    POSTURE: rounded shoulders, forward head, posterior pelvic tilt, and weight shift left   LOWER EXTREMITY MMT:  Tested in seated position   MMT Right Eval Left Eval  Hip flexion 3+ 5  Hip extension    Hip abduction 3 4-  Hip adduction 3+ 4  Hip internal rotation    Hip external rotation    Knee flexion 4 4+  Knee extension 3+ 4  Ankle dorsiflexion 5 5  Ankle plantarflexion    Ankle  inversion    Ankle eversion    (Blank rows = not tested)  BED MOBILITY:  Independent per pt and daughter   TRANSFERS: Assistive device utilized: Environmental consultant - 2 wheeled  Sit to stand: CGA and max verbal cues  Stand to sit: CGA  GAIT: Gait pattern: step to pattern, decreased  step length- Right, decreased step length- Left, decreased stride length, Right foot flat, Left foot flat, knee flexed in stance- Right, shuffling, trunk flexed, narrow BOS, poor foot clearance- Right, and poor foot clearance- Left Distance walked: Various clinic distances  Assistive device utilized: Environmental consultant - 2 wheeled Level of assistance: Min A Comments: Pt demonstrates crouched, forward flexed   FUNCTIONAL TESTS:   [facility] PT Assessment - 09/09/22 1334       Ambulation/Gait   Gait velocity 32.8' over 46.69s = 0.7 ft/s w/RW   max VC for safe use of RW, fully turning prior to sitting and proper hand placement             TODAY'S TREATMENT:                 Next Session                                                                                                                PATIENT EDUCATION: Education details: POC, eval findings  Person educated: Patient and Child(ren) Education method: Customer service manager Education comprehension: verbalized understanding and needs further education  HOME EXERCISE PROGRAM: To be established   GOALS: Goals reviewed with patient? Yes  SHORT TERM GOALS: Target date: 10/07/2022   Pt will perform initial HEP w/mod A from daughter for improved balance, safety and strength Baseline: not established on eval  Goal status: INITIAL  2.  Pt will improve gait velocity to at least 0.9 ft/s with LRAD and CGA for improved gait efficiency   Baseline: 0.7 ft/s with RW and CGA Goal status: INITIAL  3.  Pt will perform single sit <>stand in under 45s w/mod verbal cues for improved independence and safety at home.  Baseline: 63s w/max verbal cues for proper sequencing   Goal status: INITIAL  4.  TUG to be assessed and STG/LTG written  Baseline:  Goal status: INITIAL  LONG TERM GOALS: Target date: 11/04/2022   Pt will perform final HEP w/min A from daughter for improved strength, balance, transfers and gait.  Baseline:  Goal status: INITIAL  2.  TUG goal Baseline:  Goal status: INITIAL  3.  Pt will perform 2 sit <>stands in <40s w/min verbal cues for improved independence and carryover from PT to home  Baseline: 1 rep in 63s w/max verbal cues for proper sequencing  Goal status: INITIAL  4.  Pt will improve gait velocity to at least 1.1 ft/s with LRAD and S* for improved gait efficiency and safety  Baseline: 0.7 ft/s with RW Goal status: INITIAL    ASSESSMENT:  CLINICAL IMPRESSION: Patient is a 89 year old male referred to Neuro [facility] for CVA.   Pt's PMH is significant for: seizure maintained on Keppra, hypertension, CKD stage III, pancreatitis, venous insufficiency and dysphagia who was admitted 08/05/2022 for right-sided weakness, incontinence, seizure-like activity and fall. The following deficits were present during the exam: impaired cognition, decreased strength, decreased safety awareness, improper body mechanics and impaired balance. Based on fall history and gait  speed, pt is an incr risk for falls. Pt would benefit from skilled PT to address these impairments and functional limitations to maximize functional mobility independence.    OBJECTIVE IMPAIRMENTS: Abnormal gait, decreased activity tolerance, decreased balance, decreased cognition, decreased coordination, decreased endurance, decreased knowledge of condition, decreased knowledge of use of DME, decreased mobility, difficulty walking, decreased strength, decreased safety awareness, impaired perceived functional ability, and improper body mechanics  ACTIVITY LIMITATIONS: carrying, lifting, sitting, standing, squatting, stairs, transfers, bathing, toileting, dressing,  hygiene/grooming, and locomotion level  PARTICIPATION LIMITATIONS: meal prep, cleaning, laundry, medication management, personal finances, driving, shopping, community activity, and yard work  PERSONAL FACTORS: Age, Fitness, Past/current experiences, and 3+ comorbidities: basal ganglia CVA, HTN, seizures  are also affecting patient's functional outcome.   REHAB POTENTIAL: Fair due to severity of deficits and cognitive impairment  CLINICAL DECISION MAKING: Evolving/moderate complexity  EVALUATION COMPLEXITY: Moderate  PLAN:  PT FREQUENCY: 2x/week  PT DURATION: 8 weeks  PLANNED INTERVENTIONS: Therapeutic exercises, Therapeutic activity, Neuromuscular re-education, Balance training, Gait training, Patient/Family education, Self Care, Joint mobilization, Stair training, Vestibular training, Canalith repositioning, DME instructions, Aquatic Therapy, Manual therapy, and Re-evaluation  PLAN FOR NEXT SESSION: TUG and update goal. Establish BASIC HEP for home - very simple and will need help from daughter. Work on sit <>stands, increased step length, endurance    Carlos Shields E Talissa Apple, PT, DPT 09/09/2022, 1:36 PM

## 2022-09-09 NOTE — Therapy (Signed)
OUTPATIENT SPEECH LANGUAGE PATHOLOGY EVALUATION   Patient Name: Carlos Shields MRN: [id number] DOB:15-Jan-1931, 89 y.o., male Today's Date: 09/09/2022  PCP: [name], [place] REFERRING PROVIDER: Lauraine Rinne, PA-C  END OF SESSION:  End of Session - 09/09/22 1300     Visit Number 1    Authorization Type UHC Medicare    SLP Start Time [id number]    SLP Stop Time  1400    SLP Time Calculation (min) 45 min    Activity Tolerance Patient tolerated treatment well             Past Medical History:  Diagnosis Date   CKD (chronic kidney disease) stage 3, GFR 30-59 ml/min (HCC)    Gait abnormality    Hypertension    Pancreatitis    Seizures (HCC)    Venous insufficiency    Past Surgical History:  Procedure Laterality Date   HERNIA REPAIR     TRANSCAROTID ARTERY REVASCULARIZATION  Left 08/12/2022   Procedure: Transcarotid Artery Revascularization;  Surgeon: Waynetta Sandy, MD;  Location: [facility];  Service: Vascular;  Laterality: Left;   Patient Active Problem List   Diagnosis Date Noted   CVA (cerebrovascular accident) ([place]) 08/14/2022   Acute-on-chronic kidney injury ([place]) 08/05/2022   CVA (cerebral vascular accident) ([place]) 08/05/2022   Dysphagia 08/05/2022   Abnormal TSH 123456   Acute metabolic encephalopathy 123456   CKD (chronic kidney disease), stage III ([place]) 04/26/2021   Weakness generalized 04/26/2021   Weakness 04/25/2021   Syncope 01/13/2012   Anemia 12/09/2011   Seizures ([place]) 12/08/2011   HTN (hypertension) 12/08/2011   Encephalopathy 12/08/2011    ONSET DATE: 08-05-2022   REFERRING DIAG: I63.09 (ICD-10-CM) - Cerebrovascular accident (CVA) due to thrombosis of other precerebral artery ([place])   THERAPY DIAG:  Dysphagia, unspecified type  Rationale for Evaluation and Treatment: Rehabilitation  SUBJECTIVE:   SUBJECTIVE STATEMENT: "Okay"  Pt accompanied by: family member  PERTINENT HISTORY: Per d/c summary 08/27/2022 "history of seizure  maintained on Keppra, hypertension, CKD stage III, pancreatitis, venous insufficiency and dysphagia who was admitted 08/05/2022 for right-sided weakness, incontinence, seizure-like activity and fall. MRI of the brain 08/05/2022 which revealed multiple small acute infarcts involving the left basal ganglia, left frontal, parietal and occipital cortex and adjacent white matter with slight edema but no mass effect. He was admitted and underwent MRI of the head 08/06/2022 which showed intracranial atherosclerotic disease with associated moderate stenosis at the left carotid siphon with additional severe distal L P3 stenosis. Vascular surgery consulted he underwent CTA of the neck 08/08/2022 which showed proximal left ICA stenosis 75% and 65% on the right and subsequently underwent left transcarotid artery revascularization 08/12/2022 per Dr. Donzetta Matters." Admitted to IP rehab 08/14/2022 where pt received ST/PT/OT. ST targeted dysphagia and cognition per chart review.   PAIN:  Are you having pain? No  FALLS: Has patient fallen in last 6 months?  See PT evaluation for details  LIVING ENVIRONMENT: Lives with: lives with their family Lives in: House/apartment  PLOF:  Level of assistance: Needed assistance with ADLs, Needed assistance with IADLS Employment: Retired  PATIENT GOALS: eat cereal   OBJECTIVE:   DIAGNOSTIC FINDINGS: 08/19/2022 MBSS Clinical Impression: Clinical Impression: Patient presents with a mild oropharyngeal dysphagia c/b mild deficits in bolus prep/mastication and bolus transport with swallows initiated consistently at the valleculae. Patient's pharyngeal phase is c/b reduced anterior hyoid excursion, decreased tongue base retraction and diminished stripping wave resulting in mild residue.  No penetration or aspiration observed with exception  of 1 event of flash penetration with thin liquids via straw (PAS 2).  Although patient's swallow appeared grossly functional for consistencies tested, will f/u for  potential impact on more solid consistencies given clinician presentation at the bedside. Recommend patient continue current diet of Dys. 2 textures with thin liquids with full supervision to maximize safety with overall PO intake.   Factors that may increase risk of adverse event in presence of aspiration ([place] 2021): Factors that may increase risk of adverse event in presence of aspiration ([place] 2021): Reduced cognitive function; Frail or deconditioned  COGNITION: Overall cognitive status: History of cognitive impairments - at baseline Functional deficits: Pt requires A from daughter for A with medication, schedule, and financial management. This is c/w pt's baseline prior to stroke. Pt reporting overall minimal demands on cognition, denies needs in this area requiring intervention from SLP.  AUDITORY COMPREHENSION: Overall auditory comprehension: within gross functional limits for tasks and conversation during evaluation Interfering components: attention, hearing, and processing speed Effective technique: extra processing time, repetition/stressing words, and stressing words  VERBAL EXPRESSION: Level of generative/spontaneous verbalization: within gross functional limits for tasks and conversation during evaluation Comments: reduced spoken expression, per daughter this is c/w pt's baseline. No verbal expression challenges reported at home with family.   ORAL MOTOR EXAMINATION: Overall status: WFL  CLINICAL SWALLOW ASSESSMENT:   Current diet: Dysphagia 2 (chopped/minced) and thin liquids Dentition: poor condition, missing dentition Patient directly observed with POs: Yes: thin liquids and mixed consistencies  Feeding: able to feed self Liquids provided by: cup Oral phase signs and symptoms: prolonged mastication Pharyngeal phase signs and symptoms: immediate cough with thin liquid Comments: verbal cues required to take smaller bites   STANDARDIZED  ASSESSMENTS: Deferred, primary concern was dysphagia which was assessed via clinical swallow evaluation   TODAY'S TREATMENT:  09-09-2022: SLP provided education on swallow compensations and strategies, handout provided. Education on aspiration precautions.    PATIENT EDUCATION: Education details: see above Person educated: Patient and Child(ren) Education method: Explanation, Demonstration, and Handouts Education comprehension: verbalized understanding  ASSESSMENT:  CLINICAL IMPRESSION: Patient is a 89 y.o. M who was seen today for dysphagia evaluation s/p CVA. Evaluation reveals mild dysphagia. Pt's swallow is c/b slow mastication and x1 instance of immediate cough over 5+ thin liquid trials. MBSS supports mild oropharyngeal dysphagia (delayed mastication and mild residue). Pt reports to currently eating dysphagia 2 diet, per recommendations from inpatient rehab. SLP provided education re: rationale behind that recommendation and safe diet advancement. Pt desires to eat cereal with milk. Trials of mixed consistencies with no overt s/sx of aspiration. Following instruction for small bites and pacing, pt able to demonstrate carryover in subsequent trials. Pt's daughter eats all meals with Carloyn Manner and feels comfortable with cueing [place] to employ strategies. Deficits in cognition endorsed, however, these are c/w pt's baseline per daughter's report. Skilled ST is not indicated at this time. Pt and daughter verbalize understanding that should pt's needs change, new referral would be needed.   PLAN:  SLP FREQUENCY: one time visit  Su Monks, [place] 09/09/2022, 1:59 PM

## 2022-09-09 NOTE — Patient Instructions (Signed)
Avoid distractions when you're eating and drinking, such as talking on the phone or watching TV. Cut your food into small, bite-sized pieces. Always chew your food well before swallowing. Eat and drink slowly. Take one bite or sip at a time.  Take your time!  Use your best judgement- if you're coughing with intake, SLOW down. If you keep coughing, switch to something easier to eat.    Make sure to take care of your mouth: brush 2x a day!  Make sure your mouth is clear at the end of a meal.

## 2022-09-13 ENCOUNTER — Ambulatory Visit: Payer: Medicare Other | Attending: Physician Assistant | Admitting: Physical Therapy

## 2022-09-13 DIAGNOSIS — R278 Other lack of coordination: Secondary | ICD-10-CM | POA: Diagnosis not present

## 2022-09-13 DIAGNOSIS — M6281 Muscle weakness (generalized): Secondary | ICD-10-CM | POA: Insufficient documentation

## 2022-09-13 DIAGNOSIS — R2689 Other abnormalities of gait and mobility: Secondary | ICD-10-CM | POA: Insufficient documentation

## 2022-09-13 DIAGNOSIS — R2681 Unsteadiness on feet: Secondary | ICD-10-CM | POA: Insufficient documentation

## 2022-09-13 NOTE — Therapy (Signed)
OUTPATIENT PHYSICAL THERAPY NEURO TREATMENT    Patient Name: Carlos Shields MRN: [id number] DOB:1930-11-06, 89 y.o., male Today's Date: 09/13/2022   PCP: [name], PA REFERRING PROVIDER: Cathlyn Parsons, PA-C  END OF SESSION:  PT End of Session - 09/13/22 1107     Visit Number 2    Number of Visits 17   Plus eval   Date for PT Re-Evaluation 11/04/22    Authorization Type [employer] Medicare    Progress Note Due on Visit 10    PT Start Time 1102    PT Stop Time 1145    PT Time Calculation (min) 43 min    Equipment Utilized During Treatment Gait belt    Activity Tolerance Patient tolerated treatment well    Behavior During Therapy WFL for tasks assessed/performed             Past Medical History:  Diagnosis Date   CKD (chronic kidney disease) stage 3, GFR 30-59 ml/min (HCC)    Gait abnormality    Hypertension    Pancreatitis    Seizures ([place])    Venous insufficiency    Past Surgical History:  Procedure Laterality Date   HERNIA REPAIR     TRANSCAROTID ARTERY REVASCULARIZATION  Left 08/12/2022   Procedure: Transcarotid Artery Revascularization;  Surgeon: Waynetta Sandy, MD;  Location: [facility];  Service: Vascular;  Laterality: Left;   Patient Active Problem List   Diagnosis Date Noted   CVA (cerebrovascular accident) ([place]) 08/14/2022   Acute-on-chronic kidney injury ([place]) 08/05/2022   CVA (cerebral vascular accident) ([place]) 08/05/2022   Dysphagia 08/05/2022   Abnormal TSH 123456   Acute metabolic encephalopathy 123456   CKD (chronic kidney disease), stage III ([place]) 04/26/2021   Weakness generalized 04/26/2021   Weakness 04/25/2021   Syncope 01/13/2012   Anemia 12/09/2011   Seizures ([place]) 12/08/2011   HTN (hypertension) 12/08/2011   Encephalopathy 12/08/2011    ONSET DATE: 08/25/2022  REFERRING DIAG: I63.09 (ICD-10-CM) - Cerebrovascular accident (CVA) due to thrombosis of other precerebral artery ([place])  THERAPY DIAG:  Unsteadiness on  feet  Other lack of coordination  Other abnormalities of gait and mobility  Rationale for Evaluation and Treatment: Rehabilitation  SUBJECTIVE:                                                                                                                                                                                             SUBJECTIVE STATEMENT: Pt presents to clinic w/daughter. Pt denies any falls and reports doing well. No pain today   Pt accompanied by: self and daughter, Karie Kirks    PERTINENT HISTORY: He underwent  MRI of the brain 08/05/2022 which revealed multiple small acute infarcts involving the left basal ganglia, left frontal, parietal and occipital cortex and adjacent white matter with slight edema but no mass effect. He was admitted and underwent MRI of the head 08/06/2022 which showed intracranial atherosclerotic disease with associated moderate stenosis at the left carotid siphon with additional severe distal L P3 stenosis. Vascular surgery consulted he underwent CTA of the neck 08/08/2022 which showed proximal left ICA stenosis 75% and 65% on the right and subsequently underwent left transcarotid artery revascularization 08/12/2022 per Dr. Donzetta Matters. PMH: Seizure maintained on Keppra, hypertension, CKD stage III, pancreatitis, venous insufficiency and dysphagia who was admitted 08/05/2022 for right-sided weakness, incontinence, seizure-like activity and fall.  PAIN:  Are you having pain? No  VITALS There were no vitals filed for this visit.    PRECAUTIONS: Fall and Other: seizure   WEIGHT BEARING RESTRICTIONS: No  FALLS: Has patient fallen in last 6 months? Yes. Number of falls 1 ( due to seizure?)   LIVING ENVIRONMENT: Lives with: lives with their daughter Lives in: House/apartment Stairs: No Has following equipment at home: Environmental consultant - 2 wheeled and shower chair  PLOF: Requires assistive device for independence, Needs assistance with ADLs, Needs assistance with  homemaking, Needs assistance with gait, and Needs assistance with transfers  PATIENT GOALS: per daughter, pt would like to work on walking   OBJECTIVE:   DIAGNOSTIC FINDINGS: He underwent MRI of the brain 08/05/2022 which revealed multiple small acute infarcts involving the left basal ganglia, left frontal, parietal and occipital cortex and adjacent white matter with slight edema but no mass effect. He was admitted and underwent MRI of the head 08/06/2022 which showed intracranial atherosclerotic disease with associated moderate stenosis at the left carotid siphon with additional severe distal L P3 stenosis. Vascular surgery consulted he underwent CTA of the neck 08/08/2022 which showed proximal left ICA stenosis 75% and 65% on the right and subsequently underwent left transcarotid artery revascularization 08/12/2022 per Dr. Donzetta Matters  COGNITION: Overall cognitive status: Impaired and History of cognitive impairments - at baseline   SENSATION: Pt denies numbness/tingling   COORDINATION: Heel to shin test: WNL bilaterally    POSTURE: rounded shoulders, forward head, posterior pelvic tilt, and weight shift left   LOWER EXTREMITY MMT:  Tested in seated position   MMT Right Eval Left Eval  Hip flexion 3+ 5  Hip extension    Hip abduction 3 4-  Hip adduction 3+ 4  Hip internal rotation    Hip external rotation    Knee flexion 4 4+  Knee extension 3+ 4  Ankle dorsiflexion 5 5  Ankle plantarflexion    Ankle inversion    Ankle eversion    (Blank rows = not tested)  TODAY'S TREATMENT:   Ther Ex  SciFit multi-peaks level 2 for 8 minutes using BUE/BLEs for neural priming for reciprocal movement, dynamic cardiovascular warmup and increased amplitude of stepping. RPE of 3/10 following activity   Ther Act   [facility] PT Assessment - 09/13/22 1126       Balance   Balance Assessed Yes      Standardized Balance Assessment   Standardized Balance Assessment Timed Up and Go Test      Timed Up and  Go Test   Normal TUG (seconds) 89.34   w/RW and max cues for seqeuncing           NMR  Established and demonstrated initial HEP to work on sequencing w/turns and transfers. Pt  requires max verbal and visual cues to perform properly but with practice, can perform well. However, demonstrates poor recall and is unable to teach back proper sequencing to therapist.   TRANSFERS: Assistive device utilized: Walker - 2 wheeled  Sit to stand: CGA and max verbal cues  Stand to sit: CGA Practiced 6 sit <>stands w/max cues for scooting hips to edge of seat, proper hand placement and increased anterior lean. Pt able to perform 6 reps w/SBA and max cues. However, when standing to leave clinic, pt attempted to pull himself up using RW and could not recall proper sequencing for transfer.   GAIT: Gait pattern: step to pattern, decreased step length- Right, decreased step length- Left, decreased stride length, Right foot flat, Left foot flat, knee flexed in stance- Right, shuffling, trunk flexed, narrow BOS, poor foot clearance- Right, and poor foot clearance- Left Distance walked: Various clinic distances  Assistive device utilized: Walker - 2 wheeled Level of assistance: Min A Comments: Pt demonstrated forward flexed posture w/shuffling gait pattern and occasional freezing w/turns. Max verbal cues required for safe AD management, especially with turns.                                                                                                 PATIENT EDUCATION: Education details: Initial HEP Person educated: Patient and Child(ren) Education method: Explanation, Demonstration, Tactile cues, Verbal cues, and Handouts Education comprehension: verbal cues required, tactile cues required, and needs further education  HOME EXERCISE PROGRAM: Access Code: [email or web address] URL: [email or web address] Date: 09/13/2022 Prepared by: Mickie Bail Armany Mano  Exercises - Standing Quarter Turn with Counter  Support  - 1 x daily - 7 x weekly - 3 sets - 10 reps - Sit to Stand with Armchair  - 1 x daily - 7 x weekly - 3 sets - 10 reps  GOALS: Goals reviewed with patient? Yes  SHORT TERM GOALS: Target date: 10/07/2022   Pt will perform initial HEP w/mod A from daughter for improved balance, safety and strength Baseline: not established on eval  Goal status: INITIAL  2.  Pt will improve gait velocity to at least 0.9 ft/s with LRAD and CGA for improved gait efficiency   Baseline: 0.7 ft/s with RW and CGA Goal status: INITIAL  3.  Pt will perform single sit <>stand in under 45s w/mod verbal cues for improved independence and safety at home.  Baseline: 63s w/max verbal cues for proper sequencing  Goal status: INITIAL  4.  Pt will improve normal TUG to less than or equal to 80 seconds w/LRAD and mod verbal cues for improved functional mobility and decreased fall risk.  Baseline: 89.34sw/RW and max verbal cues  Goal status: REVISED  LONG TERM GOALS: Target date: 11/04/2022   Pt will perform final HEP w/min A from daughter for improved strength, balance, transfers and gait.  Baseline:  Goal status: INITIAL  2.  Pt will improve normal TUG to less than or equal to 70 seconds w/LRAD and min verbal cues for improved functional mobility and decreased fall risk.  Baseline: 89.34sw/RW and max verbal cues Goal status: REVISED  3.  Pt will perform 2 sit <>stands in <40s w/min verbal cues for improved independence and carryover from PT to home  Baseline: 1 rep in 63s w/max verbal cues for proper sequencing  Goal status: INITIAL  4.  Pt will improve gait velocity to at least 1.1 ft/s with LRAD and S* for improved gait efficiency and safety  Baseline: 0.7 ft/s with RW Goal status: INITIAL    ASSESSMENT:  CLINICAL IMPRESSION: Emphasis of skilled PT session on balance assessment and establishing initial HEP for safety w/turns and transfers. Pt performed TUG in 89.34s w/RW and max verbal cues  for sequencing. Noted significant difficulty w/turns and occasional freezing while attempting to turn. Pt frequently attempting to step outside of RW, so min A to ensure pt stays inside walker. Pt demonstrates absent recall/retention w/education provided in clinic, but daughter able to cue pt well for continued therapy at home. Continue POC.    OBJECTIVE IMPAIRMENTS: Abnormal gait, decreased activity tolerance, decreased balance, decreased cognition, decreased coordination, decreased endurance, decreased knowledge of condition, decreased knowledge of use of DME, decreased mobility, difficulty walking, decreased strength, decreased safety awareness, impaired perceived functional ability, and improper body mechanics  ACTIVITY LIMITATIONS: carrying, lifting, sitting, standing, squatting, stairs, transfers, bathing, toileting, dressing, hygiene/grooming, and locomotion level  PARTICIPATION LIMITATIONS: meal prep, cleaning, laundry, medication management, personal finances, driving, shopping, community activity, and yard work  PERSONAL FACTORS: Age, Fitness, Past/current experiences, and 3+ comorbidities: basal ganglia CVA, HTN, seizures  are also affecting patient's functional outcome.   REHAB POTENTIAL: Fair due to severity of deficits and cognitive impairment  CLINICAL DECISION MAKING: Evolving/moderate complexity  EVALUATION COMPLEXITY: Moderate  PLAN:  PT FREQUENCY: 2x/week  PT DURATION: 8 weeks  PLANNED INTERVENTIONS: Therapeutic exercises, Therapeutic activity, Neuromuscular re-education, Balance training, Gait training, Patient/Family education, Self Care, Joint mobilization, Stair training, Vestibular training, Canalith repositioning, DME instructions, Aquatic Therapy, Manual therapy, and Re-evaluation  PLAN FOR NEXT SESSION:  add to HEP- lateral and retro stepping. Work on sit <>stands, increased step length, endurance, LE coordination    Briana Farner E Jeovanni Heuring, PT, DPT 09/13/2022, 11:45  AM

## 2022-09-15 ENCOUNTER — Ambulatory Visit (INDEPENDENT_AMBULATORY_CARE_PROVIDER_SITE_OTHER): Payer: Medicare Other | Admitting: Physician Assistant

## 2022-09-15 ENCOUNTER — Ambulatory Visit: Payer: Medicare Other | Admitting: Rehabilitative and Restorative Service Providers"

## 2022-09-15 ENCOUNTER — Ambulatory Visit (HOSPITAL_COMMUNITY)
Admission: RE | Admit: 2022-09-15 | Discharge: 2022-09-15 | Disposition: A | Discharge status: Home / Self Care | Payer: Medicare Other | Source: Ambulatory Visit | Attending: Vascular Surgery | Admitting: Vascular Surgery

## 2022-09-15 ENCOUNTER — Encounter: Payer: Self-pay | Admitting: Rehabilitative and Restorative Service Providers"

## 2022-09-15 ENCOUNTER — Other Ambulatory Visit: Payer: Self-pay

## 2022-09-15 VITALS — BP 145/83 | HR 80 | Temp 97.8°F | Wt 118.0 lb

## 2022-09-15 DIAGNOSIS — N1832 Chronic kidney disease, stage 3b: Secondary | ICD-10-CM | POA: Diagnosis not present

## 2022-09-15 DIAGNOSIS — I10 Essential (primary) hypertension: Secondary | ICD-10-CM | POA: Diagnosis not present

## 2022-09-15 DIAGNOSIS — I872 Venous insufficiency (chronic) (peripheral): Secondary | ICD-10-CM | POA: Diagnosis not present

## 2022-09-15 DIAGNOSIS — I6529 Occlusion and stenosis of unspecified carotid artery: Secondary | ICD-10-CM

## 2022-09-15 DIAGNOSIS — R278 Other lack of coordination: Secondary | ICD-10-CM | POA: Diagnosis not present

## 2022-09-15 DIAGNOSIS — R2681 Unsteadiness on feet: Secondary | ICD-10-CM | POA: Diagnosis not present

## 2022-09-15 DIAGNOSIS — M6281 Muscle weakness (generalized): Secondary | ICD-10-CM

## 2022-09-15 DIAGNOSIS — I6389 Other cerebral infarction: Secondary | ICD-10-CM | POA: Diagnosis not present

## 2022-09-15 DIAGNOSIS — R269 Unspecified abnormalities of gait and mobility: Secondary | ICD-10-CM | POA: Diagnosis not present

## 2022-09-15 DIAGNOSIS — R2689 Other abnormalities of gait and mobility: Secondary | ICD-10-CM | POA: Diagnosis not present

## 2022-09-15 DIAGNOSIS — R1319 Other dysphagia: Secondary | ICD-10-CM | POA: Diagnosis not present

## 2022-09-15 NOTE — Progress Notes (Unsigned)
History of Present Illness:  Patient is a 89 y.o. year old male who presents for evaluation of carotid stenosis. He is s/p left TCAR for symptomatic mild right sided weakness left ICA stenosis.  Velocities in the right ICA are consistent with a 40-59%.   He states he is getting stronger and is living with his daughter.  He denise new symptoms of stroke or TIA since his surgery.  He is medically managed on ASA, Plavix and Statin daily.       Past Medical History:  Diagnosis Date   CKD (chronic kidney disease) stage 3, GFR 30-59 ml/min (HCC)    Gait abnormality    Hypertension    Pancreatitis    Seizures (HCC)    Venous insufficiency     Past Surgical History:  Procedure Laterality Date   HERNIA REPAIR     TRANSCAROTID ARTERY REVASCULARIZATION  Left 08/12/2022   Procedure: Transcarotid Artery Revascularization;  Surgeon: Waynetta Sandy, MD;  Location: [facility] OR;  Service: Vascular;  Laterality: Left;     Social History Social History   Tobacco Use   Smoking status: Former    Types: Cigars   Smokeless tobacco: Never  Vaping Use   Vaping Use: Never used  Substance Use Topics   Alcohol use: No   Drug use: No    Family History Family History  Problem Relation Age of Onset   Hypertension Other    Diabetes Other    Asthma Other     Allergies  No Known Allergies   Current Outpatient Medications  Medication Sig Dispense Refill   acetaminophen (TYLENOL) 325 MG tablet Take 1-2 tablets (325-650 mg total) by mouth every 4 (four) hours as needed for mild pain.     amLODipine (NORVASC) 5 MG tablet Take 1 tablet (5 mg total) by mouth daily. 30 tablet 0   aspirin EC 81 MG tablet Take 81 mg by mouth daily. Swallow whole.     clopidogrel (PLAVIX) 75 MG tablet Take 1 tablet (75 mg total) by mouth daily. 30 tablet 0   COMIRNATY SUSP injection      levETIRAcetam (KEPPRA) 500 MG tablet 1 tab(s) orally 2 times a day for 30 days     memantine (NAMENDA) 5 MG tablet  Take 1 tablet (5 mg total) by mouth daily. 30 tablet 0   metoprolol tartrate (LOPRESSOR) 25 MG tablet Take 0.5 tablets (12.5 mg total) by mouth 2 (two) times daily. 60 tablet 0   pantoprazole (PROTONIX) 40 MG tablet Take 1 tablet (40 mg total) by mouth daily. 30 tablet 0   polyethylene glycol (MIRALAX / GLYCOLAX) 17 g packet Take 17 g by mouth daily. 14 each 0   rosuvastatin (CRESTOR) 10 MG tablet Take 1 tablet (10 mg total) by mouth daily. 30 tablet 0   No current facility-administered medications for this visit.    ROS:   General:  No weight loss, Fever, chills  HEENT: No recent headaches, no nasal bleeding, no visual changes, no sore throat  Neurologic: No dizziness, blackouts, seizures. Positive left CVA recent symptoms of stroke or mini- stroke. No recent episodes of slurred speech, or temporary blindness.  Cardiac: No recent episodes of chest pain/pressure, no shortness of breath at rest.  No shortness of breath with exertion.  Denies history of atrial fibrillation or irregular heartbeat  Vascular: No history of rest pain in feet.  No history of claudication.  No history of non-healing ulcer, No history of DVT  Pulmonary: No home oxygen, no productive cough, no hemoptysis,  No asthma or wheezing  Musculoskeletal:  [x ] Arthritis, '[ ]'$  Low back pain,  '[ ]'$  Joint pain  Hematologic:No history of hypercoagulable state.  No history of easy bleeding.  No history of anemia  Gastrointestinal: No hematochezia or melena,  No gastroesophageal reflux, no trouble swallowing  Urinary: [ x] chronic Kidney disease, '[ ]'$  on HD - '[ ]'$  MWF or '[ ]'$  TTHS, '[ ]'$  Burning with urination, '[ ]'$  Frequent urination, '[ ]'$  Difficulty urinating;   Skin: No rashes  Psychological: No history of anxiety,  No history of depression   Physical Examination  Vitals:   09/15/22 1332  BP: (!) 145/83  Pulse: 80  Temp: 97.8 F (36.6 C)  TempSrc: Temporal  SpO2: 100%  Weight: 118 lb (53.5 kg)    Body mass index  is 20.25 kg/m.  General:  Alert and oriented, no acute distress HEENT: Normal Neck: No bruit or JVD Pulmonary: Clear to auscultation bilaterally Cardiac: Regular Rate and Rhythm without murmur Gastrointestinal: Soft, non-tender, non-distended, no mass, no scars Skin: No rash Extremity Pulses:   radial pulses bilaterally Musculoskeletal: No deformity or edema  Neurologic: Upper and lower extremity motor grossly intact and symmetric.  MMT improved grossly equal strength B UE/LE  DATA:           PSV cm/sEDV cm/sStenosisPlaque Description         Comments  +----------+--------+--------+--------+--------------------------+--------+   CCA Prox  76      10              heterogenous and irregular           +----------+--------+--------+--------+--------------------------+--------+   CCA Distal52      9               smooth and heterogenous              +----------+--------+--------+--------+--------------------------+--------+   ICA Prox  213     54      40-59%  heterogenous and irregular           +----------+--------+--------+--------+--------------------------+--------+   ICA Mid   102     18                                                   +----------+--------+--------+--------+--------------------------+--------+   ICA Distal44      12                                                   +----------+--------+--------+--------+--------------------------+--------+   ECA      36      4                                                    +----------+--------+--------+--------+--------------------------+--------+    +----------+--------+-------+----------------+-------------------+           PSV cm/sEDV cmsDescribe        Arm Pressure (mmHG)  +----------+--------+-------+----------------+-------------------+  Subclavian172           Multiphasic, [id number]                   +----------+--------+-------+----------------+-------------------+   +---------+--------+--+--------+--+---------+  VertebralPSV cm/s52EDV cm/s16Antegrade  +---------+--------+--+--------+--+---------+      Left Carotid Findings:  +----------+--------+--------+--------+------------------+--------+           PSV cm/sEDV cm/sStenosisPlaque DescriptionComments  +----------+--------+--------+--------+------------------+--------+  CCA Prox  130     20                                          +----------+--------+--------+--------+------------------+--------+  CCA Distal55      11                                          +----------+--------+--------+--------+------------------+--------+  ICA Distal43      11                                          +----------+--------+--------+--------+------------------+--------+  ECA      51                                                  +----------+--------+--------+--------+------------------+--------+   +----------+--------+--------+----------------+-------------------+           PSV cm/sEDV cm/sDescribe        Arm Pressure (mmHG)  +----------+--------+--------+----------------+-------------------+  Subclavian209            Multiphasic, [id number]                  +----------+--------+--------+----------------+-------------------+   +---------+--------+--+--------+--+---------+  VertebralPSV cm/s43EDV cm/s11Antegrade  +---------+--------+--+--------+--+---------+      Left Stent(s):  +---------------+--------+--------+--------+--------+--------+  ICA           PSV cm/sEDV cm/sStenosisWaveformComments  +---------------+--------+--------+--------+--------+--------+  Prox to Stent  55      11                                +---------------+--------+--------+--------+--------+--------+  Proximal Stent 34      6                                  +---------------+--------+--------+--------+--------+--------+  Mid Stent      79      20                                +---------------+--------+--------+--------+--------+--------+  Distal Stent   62      19                                +---------------+--------+--------+--------+--------+--------+  Distal to Stent43      11                                +---------------+--------+--------+--------+--------+--------+       Summary:  Right Carotid: Velocities in the right ICA are consistent with a 40-59%                 stenosis.   Left  Carotid: Patent left ICA stent without evidence of restenosis.   Vertebrals:  Bilateral vertebral arteries demonstrate antegrade flow.  Subclavians: Normal flow hemodynamics were seen in bilateral subclavian               arteries.    ASSESSMENT/ PLAN: Carotid stenosis with symptomatic left ICA stenosis and stroke.  He is s/o left TCAR performed by Dr. Donzetta Matters 08/12/22.  He underwent carotid duplex today that revealed no evidence of restenosis in the left ICA and right ICA with 40-59% stenosis.  He has made some improves and his strength is recovering on the right Extremities.    He denise symptoms of new stroke/TIA.  He is ambulatory with a rolling walker. His daughter is caring for him.  He will continue to be active.  Continue ASA. Plavix and Statin.  F/U with repeat carotid duplex in 6 months.  If he has concerns he will call our office.     [name] PA-C [facility] Office: [phone]  MD in clinic [place]

## 2022-09-16 ENCOUNTER — Encounter: Payer: Self-pay | Admitting: Physician Assistant

## 2022-09-17 ENCOUNTER — Ambulatory Visit: Payer: Medicare Other | Admitting: Physical Therapy

## 2022-09-17 ENCOUNTER — Other Ambulatory Visit: Payer: Self-pay

## 2022-09-17 VITALS — BP 138/64 | HR 64

## 2022-09-17 DIAGNOSIS — M6281 Muscle weakness (generalized): Secondary | ICD-10-CM | POA: Diagnosis not present

## 2022-09-17 DIAGNOSIS — R2681 Unsteadiness on feet: Secondary | ICD-10-CM | POA: Diagnosis not present

## 2022-09-17 DIAGNOSIS — R2689 Other abnormalities of gait and mobility: Secondary | ICD-10-CM

## 2022-09-17 DIAGNOSIS — R278 Other lack of coordination: Secondary | ICD-10-CM

## 2022-09-17 DIAGNOSIS — I6529 Occlusion and stenosis of unspecified carotid artery: Secondary | ICD-10-CM

## 2022-09-17 NOTE — Therapy (Signed)
OUTPATIENT PHYSICAL THERAPY NEURO TREATMENT    Patient Name: Carlos Shields MRN: [id number] DOB:29-Jan-1931, 89 y.o., male Today's Date: 09/17/2022   PCP: [name], [place] REFERRING PROVIDER: Cathlyn Parsons, PA-C  END OF SESSION:  PT End of Session - 09/17/22 1203     Visit Number 3    Number of Visits 17   Plus eval   Date for PT Re-Evaluation 11/04/22    Authorization Type [employer] Medicare    Progress Note Due on Visit 10    PT Start Time 1200    PT Stop Time 1242    PT Time Calculation (min) 42 min    Equipment Utilized During Treatment Gait belt    Activity Tolerance Patient tolerated treatment well    Behavior During Therapy WFL for tasks assessed/performed              Past Medical History:  Diagnosis Date   CKD (chronic kidney disease) stage 3, GFR 30-59 ml/min (HCC)    Gait abnormality    Hypertension    Pancreatitis    Seizures (HCC)    Venous insufficiency    Past Surgical History:  Procedure Laterality Date   HERNIA REPAIR     TRANSCAROTID ARTERY REVASCULARIZATION  Left 08/12/2022   Procedure: Transcarotid Artery Revascularization;  Surgeon: Waynetta Sandy, MD;  Location: [facility];  Service: Vascular;  Laterality: Left;   Patient Active Problem List   Diagnosis Date Noted   CVA (cerebrovascular accident) ([place]) 08/14/2022   Acute-on-chronic kidney injury ([place]) 08/05/2022   CVA (cerebral vascular accident) ([place]) 08/05/2022   Dysphagia 08/05/2022   Abnormal TSH 123456   Acute metabolic encephalopathy 123456   CKD (chronic kidney disease), stage III ([place]) 04/26/2021   Weakness generalized 04/26/2021   Weakness 04/25/2021   Syncope 01/13/2012   Anemia 12/09/2011   Seizures ([place]) 12/08/2011   HTN (hypertension) 12/08/2011   Encephalopathy 12/08/2011    ONSET DATE: 08/25/2022  REFERRING DIAG: I63.09 (ICD-10-CM) - Cerebrovascular accident (CVA) due to thrombosis of other precerebral artery ([place])  THERAPY DIAG:  Other lack of  coordination  Unsteadiness on feet  Muscle weakness (generalized)  Other abnormalities of gait and mobility  Rationale for Evaluation and Treatment: Rehabilitation  SUBJECTIVE:                                                                                                                                                                                             SUBJECTIVE STATEMENT: Pt presents to clinic w/daughter. Pt denies any falls and reports doing well. No pain today. Has a care aide coming to the home starting Monday to  assist w/ADLs.   Pt accompanied by: self and daughter, Karie Kirks    PERTINENT HISTORY: He underwent MRI of the brain 08/05/2022 which revealed multiple small acute infarcts involving the left basal ganglia, left frontal, parietal and occipital cortex and adjacent white matter with slight edema but no mass effect. He was admitted and underwent MRI of the head 08/06/2022 which showed intracranial atherosclerotic disease with associated moderate stenosis at the left carotid siphon with additional severe distal L P3 stenosis. Vascular surgery consulted he underwent CTA of the neck 08/08/2022 which showed proximal left ICA stenosis 75% and 65% on the right and subsequently underwent left transcarotid artery revascularization 08/12/2022 per Dr. Donzetta Matters. PMH: Seizure maintained on Keppra, hypertension, CKD stage III, pancreatitis, venous insufficiency and dysphagia who was admitted 08/05/2022 for right-sided weakness, incontinence, seizure-like activity and fall.  PAIN:  Are you having pain? No  VITALS Vitals:   09/17/22 1208  BP: 138/64  Pulse: 64      PRECAUTIONS: Fall and Other: seizure   WEIGHT BEARING RESTRICTIONS: No  FALLS: Has patient fallen in last 6 months? Yes. Number of falls 1 ( due to seizure?)   LIVING ENVIRONMENT: Lives with: lives with their daughter Lives in: House/apartment Stairs: No Has following equipment at home: Environmental consultant - 2 wheeled and shower  chair  PLOF: Requires assistive device for independence, Needs assistance with ADLs, Needs assistance with homemaking, Needs assistance with gait, and Needs assistance with transfers  PATIENT GOALS: per daughter, pt would like to work on walking   OBJECTIVE:   DIAGNOSTIC FINDINGS: He underwent MRI of the brain 08/05/2022 which revealed multiple small acute infarcts involving the left basal ganglia, left frontal, parietal and occipital cortex and adjacent white matter with slight edema but no mass effect. He was admitted and underwent MRI of the head 08/06/2022 which showed intracranial atherosclerotic disease with associated moderate stenosis at the left carotid siphon with additional severe distal L P3 stenosis. Vascular surgery consulted he underwent CTA of the neck 08/08/2022 which showed proximal left ICA stenosis 75% and 65% on the right and subsequently underwent left transcarotid artery revascularization 08/12/2022 per Dr. Donzetta Matters  COGNITION: Overall cognitive status: Impaired and History of cognitive impairments - at baseline   SENSATION: Pt denies numbness/tingling   COORDINATION: Heel to shin test: WNL bilaterally    POSTURE: rounded shoulders, forward head, posterior pelvic tilt, and weight shift left   LOWER EXTREMITY MMT:  Tested in seated position   MMT Right Eval Left Eval  Hip flexion 3+ 5  Hip extension    Hip abduction 3 4-  Hip adduction 3+ 4  Hip internal rotation    Hip external rotation    Knee flexion 4 4+  Knee extension 3+ 4  Ankle dorsiflexion 5 5  Ankle plantarflexion    Ankle inversion    Ankle eversion    (Blank rows = not tested)  TODAY'S TREATMENT:   Ther Ex  SciFit multi-peaks level 4 for 10 minutes using BUE/BLEs for neural priming for reciprocal movement, dynamic cardiovascular warmup and increased amplitude of stepping. RPE of 1/10 following activity   NMR  In // bars for improved lateral weight shifting, LE coordination, postural control and  visual tracking:  Alt toe taps to 4" step, x12 per side starting w/BUE support and progressing to no UE support, using mirror for visual biofeedback on body position. Min tactile cues provided to anterior shoulder and pelvis to promote hip extension and upright posture, as pt tends to lean forward.  Noted increased difficulty w/tap down on R side. CGA throughout for safety.  On blue airex, standing without UE support x90s using mirror for visual biofeedback. Pt again assumed forward flexed posture, but w/tactile cues for upright posture, pt lost balance posteriorly, requiring min A to correct. Mod verbal cues to "push gas pedal" w/toes to facilitate anterior weight shift and pt able to stabilize w/CGA. Progressed to purple ball tosses w/[name], x20 reps, for improved stability with reaching out of BOS. Pt demonstrates significantly reduced righting reactions and required min A to stabilize throughout due to posterior lean. Pt only able to toss ball if hips were flexed.  Reviewed proper sit <>stand technique and pt had no recall or retention of education provided in previous sessions. Max verbal cues for proper hand placement, anterior lean and scooting hips towards edge of mat. W/cues, pt able to perform well with no retropulsion. Pt performed 2 reps with S* and proper technique.   GAIT: Gait pattern: step to pattern, decreased step length- Right, decreased step length- Left, decreased stride length, Right foot flat, Left foot flat, knee flexed in stance- Right, knee flexed in stance- Left, shuffling, trunk flexed, narrow BOS, poor foot clearance- Right, and poor foot clearance- Left Distance walked: Various clinic distances  Assistive device utilized: Walker - 2 wheeled Level of assistance: CGA Comments: Mod verbal cues for increased step length/clearance and pt able to facilitate larger steps and increased gait speed without imbalance.                                                                                                 PATIENT EDUCATION: Education details: Continue HEP, gait training w/increased step length, proper sit <>stand technique  Person educated: Patient and Child(ren) Education method: Explanation, Demonstration, Tactile cues, and Verbal cues Education comprehension: verbal cues required, tactile cues required, and needs further education  HOME EXERCISE PROGRAM: Access Code: [email or web address] URL: [email or web address] Date: 09/13/2022 Prepared by: Mickie Bail Makenzi Bannister  Exercises - Standing Quarter Turn with Counter Support  - 1 x daily - 7 x weekly - 3 sets - 10 reps - Sit to Stand with Armchair  - 1 x daily - 7 x weekly - 3 sets - 10 reps  GOALS: Goals reviewed with patient? Yes  SHORT TERM GOALS: Target date: 10/07/2022   Pt will perform initial HEP w/mod A from daughter for improved balance, safety and strength Baseline: not established on eval  Goal status: INITIAL  2.  Pt will improve gait velocity to at least 0.9 ft/s with LRAD and CGA for improved gait efficiency   Baseline: 0.7 ft/s with RW and CGA Goal status: INITIAL  3.  Pt will perform single sit <>stand in under 45s w/mod verbal cues for improved independence and safety at home.  Baseline: 63s w/max verbal cues for proper sequencing  Goal status: INITIAL  4.  Pt will improve normal TUG to less than or equal to 80 seconds w/LRAD and mod verbal cues for improved functional mobility and decreased fall risk.  Baseline: 89.34sw/RW and max verbal cues  Goal status: REVISED  LONG TERM GOALS: Target  date: 11/04/2022   Pt will perform final HEP w/min A from daughter for improved strength, balance, transfers and gait.  Baseline:  Goal status: INITIAL  2.  Pt will improve normal TUG to less than or equal to 70 seconds w/LRAD and min verbal cues for improved functional mobility and decreased fall risk.  Baseline: 89.34sw/RW and max verbal cues Goal status: REVISED  3.  Pt will perform 2 sit  <>stands in <40s w/min verbal cues for improved independence and carryover from PT to home  Baseline: 1 rep in 63s w/max verbal cues for proper sequencing  Goal status: INITIAL  4.  Pt will improve gait velocity to at least 1.1 ft/s with LRAD and S* for improved gait efficiency and safety  Baseline: 0.7 ft/s with RW Goal status: INITIAL    ASSESSMENT:  CLINICAL IMPRESSION: Emphasis of skilled PT session on endurance, LE coordination and postural control. Pt continues to require max verbal cues for proper sequencing of transfers and safety w/RW. Pt able to facilitate increased step length w/max cues, but is unable to sustain. Pt demonstrates significant forward flexed posture w/retropulsion if corrected. Continue POC.    OBJECTIVE IMPAIRMENTS: Abnormal gait, decreased activity tolerance, decreased balance, decreased cognition, decreased coordination, decreased endurance, decreased knowledge of condition, decreased knowledge of use of DME, decreased mobility, difficulty walking, decreased strength, decreased safety awareness, impaired perceived functional ability, and improper body mechanics  ACTIVITY LIMITATIONS: carrying, lifting, sitting, standing, squatting, stairs, transfers, bathing, toileting, dressing, hygiene/grooming, and locomotion level  PARTICIPATION LIMITATIONS: meal prep, cleaning, laundry, medication management, personal finances, driving, shopping, community activity, and yard work  PERSONAL FACTORS: Age, Fitness, Past/current experiences, and 3+ comorbidities: basal ganglia CVA, HTN, seizures  are also affecting patient's functional outcome.   REHAB POTENTIAL: Fair due to severity of deficits and cognitive impairment  CLINICAL DECISION MAKING: Evolving/moderate complexity  EVALUATION COMPLEXITY: Moderate  PLAN:  PT FREQUENCY: 2x/week  PT DURATION: 8 weeks  PLANNED INTERVENTIONS: Therapeutic exercises, Therapeutic activity, Neuromuscular re-education, Balance  training, Gait training, Patient/Family education, Self Care, Joint mobilization, Stair training, Vestibular training, Canalith repositioning, DME instructions, Aquatic Therapy, Manual therapy, and Re-evaluation  PLAN FOR NEXT SESSION:  add to HEP- lateral and retro stepping. Work on sit <>stands, increased step length, endurance, LE coordination, anterior weight shifting.    Cruzita Lederer Kapono Luhn, PT, DPT 09/17/2022, 12:43 PM

## 2022-09-20 ENCOUNTER — Emergency Department (HOSPITAL_COMMUNITY)
Admission: EM | Admit: 2022-09-20 | Discharge: 2022-09-20 | Disposition: A | Discharge status: Home / Self Care | Payer: Medicare Other | Attending: Emergency Medicine | Admitting: Emergency Medicine

## 2022-09-20 ENCOUNTER — Ambulatory Visit: Payer: Medicare Other | Admitting: Physical Therapy

## 2022-09-20 ENCOUNTER — Other Ambulatory Visit: Payer: Self-pay

## 2022-09-20 VITALS — BP 142/60 | HR 57

## 2022-09-20 DIAGNOSIS — M6281 Muscle weakness (generalized): Secondary | ICD-10-CM | POA: Diagnosis not present

## 2022-09-20 DIAGNOSIS — R2681 Unsteadiness on feet: Secondary | ICD-10-CM

## 2022-09-20 DIAGNOSIS — R569 Unspecified convulsions: Secondary | ICD-10-CM | POA: Diagnosis not present

## 2022-09-20 DIAGNOSIS — Z7982 Long term (current) use of aspirin: Secondary | ICD-10-CM | POA: Insufficient documentation

## 2022-09-20 DIAGNOSIS — R278 Other lack of coordination: Secondary | ICD-10-CM

## 2022-09-20 DIAGNOSIS — I10 Essential (primary) hypertension: Secondary | ICD-10-CM | POA: Insufficient documentation

## 2022-09-20 DIAGNOSIS — R404 Transient alteration of awareness: Secondary | ICD-10-CM | POA: Diagnosis not present

## 2022-09-20 DIAGNOSIS — R410 Disorientation, unspecified: Secondary | ICD-10-CM | POA: Diagnosis not present

## 2022-09-20 DIAGNOSIS — Z79899 Other long term (current) drug therapy: Secondary | ICD-10-CM | POA: Insufficient documentation

## 2022-09-20 DIAGNOSIS — R2689 Other abnormalities of gait and mobility: Secondary | ICD-10-CM

## 2022-09-20 DIAGNOSIS — Z743 Need for continuous supervision: Secondary | ICD-10-CM | POA: Diagnosis not present

## 2022-09-20 DIAGNOSIS — F0390 Unspecified dementia without behavioral disturbance: Secondary | ICD-10-CM | POA: Insufficient documentation

## 2022-09-20 DIAGNOSIS — Z8673 Personal history of transient ischemic attack (TIA), and cerebral infarction without residual deficits: Secondary | ICD-10-CM | POA: Diagnosis not present

## 2022-09-20 LAB — URINALYSIS, ROUTINE W REFLEX MICROSCOPIC
Bilirubin Urine: NEGATIVE
Glucose, UA: NEGATIVE mg/dL
Hgb urine dipstick: NEGATIVE
Ketones, ur: NEGATIVE mg/dL
Leukocytes,Ua: NEGATIVE
Nitrite: NEGATIVE
Protein, ur: NEGATIVE mg/dL
Specific Gravity, Urine: 1.011 (ref 1.005–1.030)
pH: 7 (ref 5.0–8.0)

## 2022-09-20 LAB — MAGNESIUM: Magnesium: 2 mg/dL (ref 1.7–2.4)

## 2022-09-20 LAB — CBC WITH DIFFERENTIAL/PLATELET
Abs Immature Granulocytes: 0.01 10*3/uL (ref 0.00–0.07)
Basophils Absolute: 0 10*3/uL (ref 0.0–0.1)
Basophils Relative: 1 %
Eosinophils Absolute: 0.2 10*3/uL (ref 0.0–0.5)
Eosinophils Relative: 4 %
HCT: 30.9 % — ABNORMAL LOW (ref 39.0–52.0)
Hemoglobin: 9.7 g/dL — ABNORMAL LOW (ref 13.0–17.0)
Immature Granulocytes: 0 %
Lymphocytes Relative: 33 %
Lymphs Abs: 1.8 10*3/uL (ref 0.7–4.0)
MCH: 29 pg (ref 26.0–34.0)
MCHC: 31.4 g/dL (ref 30.0–36.0)
MCV: 92.2 fL (ref 80.0–100.0)
Monocytes Absolute: 0.7 10*3/uL (ref 0.1–1.0)
Monocytes Relative: 12 %
Neutro Abs: 2.8 10*3/uL (ref 1.7–7.7)
Neutrophils Relative %: 50 %
Platelets: 164 10*3/uL (ref 150–400)
RBC: 3.35 MIL/uL — ABNORMAL LOW (ref 4.22–5.81)
RDW: 12.7 % (ref 11.5–15.5)
WBC: 5.5 10*3/uL (ref 4.0–10.5)
nRBC: 0 % (ref 0.0–0.2)

## 2022-09-20 LAB — COMPREHENSIVE METABOLIC PANEL
ALT: 13 U/L (ref 0–44)
AST: 27 U/L (ref 15–41)
Albumin: 3.9 g/dL (ref 3.5–5.0)
Alkaline Phosphatase: 64 U/L (ref 38–126)
Anion gap: 11 (ref 5–15)
BUN: 20 mg/dL (ref 8–23)
CO2: 22 mmol/L (ref 22–32)
Calcium: 9.2 mg/dL (ref 8.9–10.3)
Chloride: 105 mmol/L (ref 98–111)
Creatinine, Ser: 1.77 mg/dL — ABNORMAL HIGH (ref 0.61–1.24)
GFR, Estimated: 36 mL/min — ABNORMAL LOW (ref >60)
Glucose, Bld: 108 mg/dL — ABNORMAL HIGH (ref 70–99)
Potassium: 4.1 mmol/L (ref 3.5–5.1)
Sodium: 138 mmol/L (ref 135–145)
Total Bilirubin: 0.6 mg/dL (ref 0.3–1.2)
Total Protein: 7.4 g/dL (ref 6.5–8.1)

## 2022-09-20 LAB — CBG MONITORING, ED: Glucose-Capillary: 103 mg/dL — ABNORMAL HIGH (ref 70–99)

## 2022-09-20 MED ORDER — LEVETIRACETAM 750 MG PO TABS: 750.0000 mg | ORAL_TABLET | Freq: Two times a day (BID) | ORAL | 0 refills | Status: DC

## 2022-09-20 MED ORDER — LEVETIRACETAM 500 MG PO TABS
750.0000 mg | ORAL_TABLET | Freq: Once | ORAL | Status: AC
  Administered 2022-09-20: 750 mg via ORAL
  Filled 2022-09-20: qty 1

## 2022-09-20 NOTE — ED Triage Notes (Signed)
Pt BIB [facility] from home due seizure like activity that lasted for a couple minutes.  Pt lives with daughter and she states she stepped for a 5 minutes when she came back he was having seizure like activity.  No history of seizures.  Pt does have hx stroke 08/2022 with right arm deficits.  Pt does take plavix but no falls or trauma.  20g left forearm.

## 2022-09-20 NOTE — Discharge Instructions (Signed)
You were seen in the emergency department after your seizure.  Your workup showed no obvious cause for your seizure today and we spoke with neurology and they recommended that we increase your Keppra to 750 mg twice daily.  You should take this new prescription as prescribed.  You can follow-up with your primary doctor and your neurologist to have your symptoms rechecked and to determine if you need further medication changes.  You should return to the emergency department if you have back-to-back seizures without returning to your normal self in between, you have prolonged seizures lasting more than 5 to 10 minutes, you injure yourself during a seizure or if you have any other new or concerning symptoms.

## 2022-09-20 NOTE — Therapy (Signed)
OUTPATIENT PHYSICAL THERAPY NEURO TREATMENT    Patient Name: Carlos Shields MRN: [id number] DOB:1930-09-28, 89 y.o., male Today's Date: 09/20/2022   PCP: [name], PA REFERRING PROVIDER: Cathlyn Parsons, PA-C  END OF SESSION:  PT End of Session - 09/20/22 1235     Visit Number 4    Number of Visits 17   Plus eval   Date for PT Re-Evaluation 11/04/22    Authorization Type [employer] Medicare    Progress Note Due on Visit 10    PT Start Time 1231    PT Stop Time 1313    PT Time Calculation (min) 42 min    Equipment Utilized During Treatment Gait belt    Activity Tolerance Patient tolerated treatment well    Behavior During Therapy WFL for tasks assessed/performed               Past Medical History:  Diagnosis Date   CKD (chronic kidney disease) stage 3, GFR 30-59 ml/min (HCC)    Gait abnormality    Hypertension    Pancreatitis    Seizures ([place])    Venous insufficiency    Past Surgical History:  Procedure Laterality Date   HERNIA REPAIR     TRANSCAROTID ARTERY REVASCULARIZATION  Left 08/12/2022   Procedure: Transcarotid Artery Revascularization;  Surgeon: Waynetta Sandy, MD;  Location: [facility];  Service: Vascular;  Laterality: Left;   Patient Active Problem List   Diagnosis Date Noted   CVA (cerebrovascular accident) ([place]) 08/14/2022   Acute-on-chronic kidney injury ([place]) 08/05/2022   CVA (cerebral vascular accident) ([place]) 08/05/2022   Dysphagia 08/05/2022   Abnormal TSH 123456   Acute metabolic encephalopathy 123456   CKD (chronic kidney disease), stage III ([place]) 04/26/2021   Weakness generalized 04/26/2021   Weakness 04/25/2021   Syncope 01/13/2012   Anemia 12/09/2011   Seizures ([place]) 12/08/2011   HTN (hypertension) 12/08/2011   Encephalopathy 12/08/2011    ONSET DATE: 08/25/2022  REFERRING DIAG: I63.09 (ICD-10-CM) - Cerebrovascular accident (CVA) due to thrombosis of other precerebral artery ([place])  THERAPY DIAG:  Other lack of  coordination  Unsteadiness on feet  Muscle weakness (generalized)  Other abnormalities of gait and mobility  Rationale for Evaluation and Treatment: Rehabilitation  SUBJECTIVE:                                                                                                                                                                                             SUBJECTIVE STATEMENT: Pt presents to clinic w/daughter. Daughter reports pt has been walking around in house without his RW despite education from her and therapists. No falls.  Pt accompanied by: self and daughter, Karie Kirks    PERTINENT HISTORY: He underwent MRI of the brain 08/05/2022 which revealed multiple small acute infarcts involving the left basal ganglia, left frontal, parietal and occipital cortex and adjacent white matter with slight edema but no mass effect. He was admitted and underwent MRI of the head 08/06/2022 which showed intracranial atherosclerotic disease with associated moderate stenosis at the left carotid siphon with additional severe distal L P3 stenosis. Vascular surgery consulted he underwent CTA of the neck 08/08/2022 which showed proximal left ICA stenosis 75% and 65% on the right and subsequently underwent left transcarotid artery revascularization 08/12/2022 per Dr. Donzetta Matters. PMH: Seizure maintained on Keppra, hypertension, CKD stage III, pancreatitis, venous insufficiency and dysphagia who was admitted 08/05/2022 for right-sided weakness, incontinence, seizure-like activity and fall.  PAIN:  Are you having pain? No  VITALS Vitals:   09/20/22 1238  BP: (!) 142/60  Pulse: (!) 57       PRECAUTIONS: Fall and Other: seizure   WEIGHT BEARING RESTRICTIONS: No  FALLS: Has patient fallen in last 6 months? Yes. Number of falls 1 ( due to seizure?)   LIVING ENVIRONMENT: Lives with: lives with their daughter Lives in: House/apartment Stairs: No Has following equipment at home: Environmental consultant - 2 wheeled and shower  chair  PLOF: Requires assistive device for independence, Needs assistance with ADLs, Needs assistance with homemaking, Needs assistance with gait, and Needs assistance with transfers  PATIENT GOALS: per daughter, pt would like to work on walking   OBJECTIVE:   DIAGNOSTIC FINDINGS: He underwent MRI of the brain 08/05/2022 which revealed multiple small acute infarcts involving the left basal ganglia, left frontal, parietal and occipital cortex and adjacent white matter with slight edema but no mass effect. He was admitted and underwent MRI of the head 08/06/2022 which showed intracranial atherosclerotic disease with associated moderate stenosis at the left carotid siphon with additional severe distal L P3 stenosis. Vascular surgery consulted he underwent CTA of the neck 08/08/2022 which showed proximal left ICA stenosis 75% and 65% on the right and subsequently underwent left transcarotid artery revascularization 08/12/2022 per Dr. Donzetta Matters  COGNITION: Overall cognitive status: Impaired and History of cognitive impairments - at baseline   SENSATION: Pt denies numbness/tingling   COORDINATION: Heel to shin test: WNL bilaterally    POSTURE: rounded shoulders, forward head, posterior pelvic tilt, and weight shift left   LOWER EXTREMITY MMT:  Tested in seated position   MMT Right Eval Left Eval  Hip flexion 3+ 5  Hip extension    Hip abduction 3 4-  Hip adduction 3+ 4  Hip internal rotation    Hip external rotation    Knee flexion 4 4+  Knee extension 3+ 4  Ankle dorsiflexion 5 5  Ankle plantarflexion    Ankle inversion    Ankle eversion    (Blank rows = not tested)  TODAY'S TREATMENT:   Ther Ex  SciFit multi-peaks level 5 for 8 minutes using BUE/BLEs for neural priming for reciprocal movement, dynamic cardiovascular warmup and increased amplitude of stepping. Mod verbal cues for full knee extension bilaterally throughout activity, as pt unable to maintain.   NMR  Weaving through  cones w/RW to work on AD management, sequencing w/turns and safety at home x2. Pt required max verbal cues to stay inside walker at all times, as he tends to pick up walker and place it away from him to navigate obstacles. Also noted pt staying too close to R side of walker,  so max cues to stay in middle of RW to avoid tripping on legs of walker. Pt took very small, shuffling steps throughout despite cues for larger steps.  Tied red theraband to anterior legs of RW to provide cue for pt to stay inside walker and step towards band for improved AD management and step length. Pt amb 230' w/red theraband and mod verbal cues to "step toward band". Noted increased step length but continued narrow BOS and occasional stepping outside of RW on R side, requiring min cues to correct.   GAIT: Gait pattern: step to pattern, decreased step length- Right, decreased step length- Left, decreased stride length, Right foot flat, Left foot flat, knee flexed in stance- Right, knee flexed in stance- Left, shuffling, trunk flexed, narrow BOS, poor foot clearance- Right, and poor foot clearance- Left Distance walked: Various clinic distances  Assistive device utilized: Walker - 2 wheeled Level of assistance: CGA Comments: Mod verbal cues for pt to fully turn prior to sitting down on mat/SciFit as pt tends to push RW away and reach out for seat.                                                                                                 PATIENT EDUCATION: Education details: Using RW at all times at home, taking large steps towards red theraband on RW, reviewed proper sit <>stand technique  Person educated: Patient and Child(ren) Education method: Explanation, Demonstration, Tactile cues, and Verbal cues Education comprehension: verbal cues required, tactile cues required, and needs further education  HOME EXERCISE PROGRAM: Access Code: [email or web address] URL: [email or web address] Date: 09/13/2022 Prepared by:  Mickie Bail Mckenlee Mangham  Exercises - Standing Quarter Turn with Counter Support  - 1 x daily - 7 x weekly - 3 sets - 10 reps - Sit to Stand with Armchair  - 1 x daily - 7 x weekly - 3 sets - 10 reps  GOALS: Goals reviewed with patient? Yes  SHORT TERM GOALS: Target date: 10/07/2022   Pt will perform initial HEP w/mod A from daughter for improved balance, safety and strength Baseline: not established on eval  Goal status: INITIAL  2.  Pt will improve gait velocity to at least 0.9 ft/s with LRAD and CGA for improved gait efficiency   Baseline: 0.7 ft/s with RW and CGA Goal status: INITIAL  3.  Pt will perform single sit <>stand in under 45s w/mod verbal cues for improved independence and safety at home.  Baseline: 63s w/max verbal cues for proper sequencing  Goal status: INITIAL  4.  Pt will improve normal TUG to less than or equal to 80 seconds w/LRAD and mod verbal cues for improved functional mobility and decreased fall risk.  Baseline: 89.34sw/RW and max verbal cues  Goal status: REVISED  LONG TERM GOALS: Target date: 11/04/2022   Pt will perform final HEP w/min A from daughter for improved strength, balance, transfers and gait.  Baseline:  Goal status: INITIAL  2.  Pt will improve normal TUG to less than or equal to 70 seconds w/LRAD and min verbal cues for improved functional mobility and decreased fall  risk.  Baseline: 89.34sw/RW and max verbal cues Goal status: REVISED  3.  Pt will perform 2 sit <>stands in <40s w/min verbal cues for improved independence and carryover from PT to home  Baseline: 1 rep in 63s w/max verbal cues for proper sequencing  Goal status: INITIAL  4.  Pt will improve gait velocity to at least 1.1 ft/s with LRAD and S* for improved gait efficiency and safety  Baseline: 0.7 ft/s with RW Goal status: INITIAL    ASSESSMENT:  CLINICAL IMPRESSION: Emphasis of skilled PT session on endurance, AD management and gait training w/RW. Pt's daughter  reports pt has been attempting to walk without RW inside house, so educated pt on safety risks associated with this and provided red band (pt's favorite color) on walker as reminder to use RW. Pt continues to demonstrate short, shuffled steps and requires max cues for proper AD management. However, this did improve with use of red band as a cue for increased step length and safe distance to RW. Continue POC.    OBJECTIVE IMPAIRMENTS: Abnormal gait, decreased activity tolerance, decreased balance, decreased cognition, decreased coordination, decreased endurance, decreased knowledge of condition, decreased knowledge of use of DME, decreased mobility, difficulty walking, decreased strength, decreased safety awareness, impaired perceived functional ability, and improper body mechanics  ACTIVITY LIMITATIONS: carrying, lifting, sitting, standing, squatting, stairs, transfers, bathing, toileting, dressing, hygiene/grooming, and locomotion level  PARTICIPATION LIMITATIONS: meal prep, cleaning, laundry, medication management, personal finances, driving, shopping, community activity, and yard work  PERSONAL FACTORS: Age, Fitness, Past/current experiences, and 3+ comorbidities: basal ganglia CVA, HTN, seizures  are also affecting patient's functional outcome.   REHAB POTENTIAL: Fair due to severity of deficits and cognitive impairment  CLINICAL DECISION MAKING: Evolving/moderate complexity  EVALUATION COMPLEXITY: Moderate  PLAN:  PT FREQUENCY: 2x/week  PT DURATION: 8 weeks  PLANNED INTERVENTIONS: Therapeutic exercises, Therapeutic activity, Neuromuscular re-education, Balance training, Gait training, Patient/Family education, Self Care, Joint mobilization, Stair training, Vestibular training, Canalith repositioning, DME instructions, Aquatic Therapy, Manual therapy, and Re-evaluation  PLAN FOR NEXT SESSION:  Check BP. add to HEP- lateral and retro stepping. Work on sit <>stands, increased step length,  endurance, LE coordination, anterior weight shifting. Alt stepping over beam -fwd and lat    Kamaury Cutbirth E Lisha Vitale, PT, DPT 09/20/2022, 1:14 PM

## 2022-09-20 NOTE — ED Provider Notes (Signed)
[facility] Provider Note   CSN: [id number] Arrival date & time: 09/20/22  1702     History  Chief Complaint  Patient presents with   Seizures    Carlos Shields is a 89 y.o. male.  Patient is an 57M PMH prior stroke, seizure disorder on Keppra, dementia, HTN presenting to the ED after a seizure. Per patient's daughter he was in his recliner and she noticed he was having seizure-like activity. She states his arms and legs were shaking and eyes were deviated. She states this lasted a few minutes. She states he was no longer shaking when EMS arrived but appeared confused pulling on his clothes. She states he has been taking all his meds as prescribed and has had no recent illnesses, fever, N/V/D. The patient states he does not remember anything that happened prior to arrival.  The history is provided by the patient, the EMS personnel and a relative. History limited by: Level 5 caveat for dementia, seizure.  Seizures      Home Medications Prior to Admission medications   Medication Sig Start Date End Date Taking? Authorizing Provider  levETIRAcetam (KEPPRA) 750 MG tablet Take 1 tablet (750 mg total) by mouth 2 (two) times daily. 09/20/22 10/20/22 Yes Leanord Asal K, DO  acetaminophen (TYLENOL) 325 MG tablet Take 1-2 tablets (325-650 mg total) by mouth every 4 (four) hours as needed for mild pain. 08/26/22   Angiulli, Lavon Paganini, PA-C  amLODipine (NORVASC) 5 MG tablet Take 1 tablet (5 mg total) by mouth daily. 08/26/22   Angiulli, Lavon Paganini, PA-C  aspirin EC 81 MG tablet Take 81 mg by mouth daily. Swallow whole.    [provider]  clopidogrel (PLAVIX) 75 MG tablet Take 1 tablet (75 mg total) by mouth daily. 09/08/22   Bayard Hugger, NP  COMIRNATY SUSP injection  04/28/22   [provider]  levETIRAcetam (KEPPRA) 500 MG tablet 1 tab(s) orally 2 times a day for 30 days    [provider]  memantine (NAMENDA) 5 MG tablet  Take 1 tablet (5 mg total) by mouth daily. 08/26/22   Angiulli, Lavon Paganini, PA-C  metoprolol tartrate (LOPRESSOR) 25 MG tablet Take 0.5 tablets (12.5 mg total) by mouth 2 (two) times daily. 08/26/22   Angiulli, Lavon Paganini, PA-C  pantoprazole (PROTONIX) 40 MG tablet Take 1 tablet (40 mg total) by mouth daily. 08/26/22   Angiulli, Lavon Paganini, PA-C  polyethylene glycol (MIRALAX / GLYCOLAX) 17 g packet Take 17 g by mouth daily. 08/26/22   Angiulli, Lavon Paganini, PA-C  rosuvastatin (CRESTOR) 10 MG tablet Take 1 tablet (10 mg total) by mouth daily. 08/26/22   Angiulli, Lavon Paganini, PA-C      Allergies    Patient has no known allergies.    Review of Systems   Review of Systems  Neurological:  Positive for seizures.    Physical Exam Updated Vital Signs BP (!) 128/100   Pulse 65   Temp 97.6 F (36.4 C) (Oral)   Resp 12   Ht '5\' 4"'$  (1.626 m)   Wt 53 kg   SpO2 100%   BMI 20.06 kg/m  Physical Exam Vitals and nursing note reviewed.  Constitutional:      General: He is not in acute distress.    Appearance: Normal appearance.  HENT:     Head: Normocephalic and atraumatic.     Nose: Nose normal.     Mouth/Throat:     Mouth: Mucous  membranes are moist.     Pharynx: Oropharynx is clear.     Comments: No evidence of tongue bite Eyes:     Extraocular Movements: Extraocular movements intact.     Conjunctiva/sclera: Conjunctivae normal.     Pupils: Pupils are equal, round, and reactive to light.  Cardiovascular:     Rate and Rhythm: Normal rate and regular rhythm.     Heart sounds: Normal heart sounds.  Pulmonary:     Effort: Pulmonary effort is normal.     Breath sounds: Normal breath sounds.  Abdominal:     General: Abdomen is flat.     Palpations: Abdomen is soft.     Tenderness: There is no abdominal tenderness.  Musculoskeletal:        General: Normal range of motion.     Cervical back: Normal range of motion and neck supple.     Right lower leg: No edema.     Left lower leg: No edema.  Skin:     General: Skin is warm and dry.  Neurological:     General: No focal deficit present.     Mental Status: He is alert. Mental status is at baseline.     Sensory: No sensory deficit.     Motor: No weakness.  Psychiatric:        Mood and Affect: Mood normal.        Behavior: Behavior normal.     ED Results / Procedures / Treatments   Labs (all labs ordered are listed, but only abnormal results are displayed) Labs Reviewed  CBC WITH DIFFERENTIAL/PLATELET - Abnormal; Notable for the following components:      Result Value   RBC 3.35 (*)    Hemoglobin 9.7 (*)    HCT 30.9 (*)    All other components within normal limits  COMPREHENSIVE METABOLIC PANEL - Abnormal; Notable for the following components:   Glucose, Bld 108 (*)    Creatinine, Ser 1.77 (*)    GFR, Estimated 36 (*)    All other components within normal limits  URINALYSIS, ROUTINE W REFLEX MICROSCOPIC - Abnormal; Notable for the following components:   Color, Urine STRAW (*)    All other components within normal limits  CBG MONITORING, ED - Abnormal; Notable for the following components:   Glucose-Capillary 103 (*)    All other components within normal limits  MAGNESIUM    EKG EKG Interpretation  Date/Time:  Monday September 20 2022 17:55:14 EDT Ventricular Rate:  63 PR Interval:  193 QRS Duration: 87 QT Interval:  439 QTC Calculation: 450 R Axis:   -41 Text Interpretation: Sinus rhythm Left axis deviation No significant change since last tracing Confirmed by Leanord Asal ([id number]) on 09/20/2022 6:23:51 PM  Radiology No results found.  Procedures Procedures    Medications Ordered in ED Medications  levETIRAcetam (KEPPRA) tablet 750 mg (750 mg Oral Given 09/20/22 2149)    ED Course/ Medical Decision Making/ A&P Clinical Course as of 09/20/22 2359  Mon Sep 20, 2022  2107 Labs within normal range and patient remains at neurologic baseline without further seizure activity in the ED. Neurology will be consulted  for medication recommendations with plan for likely discharge home. [VK]  2109 I spoke with Dr. Leonel Ramsay of neurology who recommended increasing Keppra to 750 mg BID. Patient is stable for discharge home with outpatient neurology follow up. [VK]    Clinical Course User Index [VK] Kemper Durie, DO  Medical Decision Making This patient presents to the ED with chief complaint(s) of seizure with pertinent past medical history of CVA, seizure disorder, HTN which further complicates the presenting complaint. The complaint involves an extensive differential diagnosis and also carries with it a high risk of complications and morbidity.    The differential diagnosis includes breakthrough seizure, electrolyte abnormality, infection, patient has no focal neurologic deficit and no headache making CVA, ICH or mass effect unlikely, no trauma from seizure seen on exam or by history, patient is back to neurologic baseline without intervention making status epilepticus unlikely    Additional history obtained: Additional history obtained from family and EMS  Records reviewed previous admission documents  ED Course and Reassessment: On patient's arrival he was awake and alert at his neurologic baseline. EKG was performed that showed no arrhythmia or ischemic changes. Patient's story per daughter did sound like seizure making syncope less likely. He will have labs performed to evaluate for infection or electrolyte abnormality that may lower his seizure threshold and he will be closely reassessed.   Independent labs interpretation:  The following labs were independently interpreted: within normal range  Independent visualization of imaging: N/A  Consultation: - Consulted or discussed management/test interpretation w/ external professional: Neurology  Consideration for admission or further workup: Patient has no emergent conditions requiring admission or further work-up  at this time and is stable for discharge home with primary care follow-up  Social Determinants of health: N/A    Amount and/or Complexity of Data Reviewed Labs: ordered.  Risk Prescription drug management.          Final Clinical Impression(s) / ED Diagnoses Final diagnoses:  Seizure [facility])    Rx / DC Orders ED Discharge Orders          Ordered    levETIRAcetam (KEPPRA) 750 MG tablet  2 times daily        09/20/22 2218              Kemper Durie, DO 09/20/22 2359

## 2022-09-22 ENCOUNTER — Ambulatory Visit: Payer: Medicare Other

## 2022-09-22 DIAGNOSIS — M6281 Muscle weakness (generalized): Secondary | ICD-10-CM | POA: Diagnosis not present

## 2022-09-22 DIAGNOSIS — R2689 Other abnormalities of gait and mobility: Secondary | ICD-10-CM

## 2022-09-22 DIAGNOSIS — R2681 Unsteadiness on feet: Secondary | ICD-10-CM

## 2022-09-22 DIAGNOSIS — R278 Other lack of coordination: Secondary | ICD-10-CM

## 2022-09-22 NOTE — Therapy (Signed)
OUTPATIENT PHYSICAL THERAPY NEURO TREATMENT    Patient Name: Carlos Shields MRN: [id number] DOB:02/14/1931, 89 y.o., male Today's Date: 09/22/2022   PCP: [name], PA REFERRING PROVIDER: Cathlyn Parsons, PA-C  END OF SESSION:  PT End of Session - 09/22/22 1243     Visit Number 5    Number of Visits 17    Date for PT Re-Evaluation 11/04/22    Authorization Type [employer] Medicare    Progress Note Due on Visit 10    PT Start Time 1233    PT Stop Time 1313    PT Time Calculation (min) 40 min    Equipment Utilized During Treatment Gait belt    Activity Tolerance Patient tolerated treatment well    Behavior During Therapy WFL for tasks assessed/performed               Past Medical History:  Diagnosis Date   CKD (chronic kidney disease) stage 3, GFR 30-59 ml/min (HCC)    Gait abnormality    Hypertension    Pancreatitis    Seizures ([place])    Venous insufficiency    Past Surgical History:  Procedure Laterality Date   HERNIA REPAIR     TRANSCAROTID ARTERY REVASCULARIZATION  Left 08/12/2022   Procedure: Transcarotid Artery Revascularization;  Surgeon: Waynetta Sandy, MD;  Location: [place];  Service: Vascular;  Laterality: Left;   Patient Active Problem List   Diagnosis Date Noted   CVA (cerebrovascular accident) ([place]) 08/14/2022   Acute-on-chronic kidney injury ([place]) 08/05/2022   CVA (cerebral vascular accident) ([place]) 08/05/2022   Dysphagia 08/05/2022   Abnormal TSH 123456   Acute metabolic encephalopathy 123456   CKD (chronic kidney disease), stage III ([place]) 04/26/2021   Weakness generalized 04/26/2021   Weakness 04/25/2021   Syncope 01/13/2012   Anemia 12/09/2011   Seizures ([place]) 12/08/2011   HTN (hypertension) 12/08/2011   Encephalopathy 12/08/2011    ONSET DATE: 08/25/2022  REFERRING DIAG: I63.09 (ICD-10-CM) - Cerebrovascular accident (CVA) due to thrombosis of other precerebral artery ([place])  THERAPY DIAG:  Other lack of  coordination  Unsteadiness on feet  Muscle weakness (generalized)  Other abnormalities of gait and mobility  Rationale for Evaluation and Treatment: Rehabilitation  SUBJECTIVE:                                                                                                                                                                                             SUBJECTIVE STATEMENT: Patient arrives to clinic with daughter. Did have seizure on Monday and was in the ED. Per dtr, needed an increase in dose of Keppra as his body "got  too used to it." Per dtr, moving slower than prior to the seizure. Denies falls/near falls. Per dtr, new caregiver started on Monday and will be in their home for a few hours M/W/F.   Pt accompanied by: self and daughter, Karie Kirks    PERTINENT HISTORY: He underwent MRI of the brain 08/05/2022 which revealed multiple small acute infarcts involving the left basal ganglia, left frontal, parietal and occipital cortex and adjacent white matter with slight edema but no mass effect. He was admitted and underwent MRI of the head 08/06/2022 which showed intracranial atherosclerotic disease with associated moderate stenosis at the left carotid siphon with additional severe distal L P3 stenosis. Vascular surgery consulted he underwent CTA of the neck 08/08/2022 which showed proximal left ICA stenosis 75% and 65% on the right and subsequently underwent left transcarotid artery revascularization 08/12/2022 per Dr. Donzetta Matters. PMH: Seizure maintained on Keppra, hypertension, CKD stage III, pancreatitis, venous insufficiency and dysphagia who was admitted 08/05/2022 for right-sided weakness, incontinence, seizure-like activity and fall.  PAIN:  Are you having pain? No  VITALS 135/65 HR: 58   PRECAUTIONS: Fall and Other: seizure   PATIENT GOALS: per daughter, pt would like to work on walking   TODAY'S TREATMENT:   NMR: -scifit hills level 1 x10 mins B UE/LE for neural priming and large  amplitude reciprocal coordination -blocked practice TUG with max cues for walker management and reminders to use external cues (red theraband on walker to encourage longer step length)                                                                                               PATIENT EDUCATION: Education details: continue HEP  Person educated: Patient and Child(ren) Education method: Explanation, Demonstration, Tactile cues, and Verbal cues Education comprehension: verbal cues required, tactile cues required, and needs further education  HOME EXERCISE PROGRAM: Access Code: [email or web address] URL: [email or web address] Date: 09/13/2022 Prepared by: Mickie Bail Plaster  Exercises - Standing Quarter Turn with Counter Support  - 1 x daily - 7 x weekly - 3 sets - 10 reps - Sit to Stand with Armchair  - 1 x daily - 7 x weekly - 3 sets - 10 reps  GOALS: Goals reviewed with patient? Yes  SHORT TERM GOALS: Target date: 10/07/2022   Pt will perform initial HEP w/mod A from daughter for improved balance, safety and strength Baseline: not established on eval  Goal status: INITIAL  2.  Pt will improve gait velocity to at least 0.9 ft/s with LRAD and CGA for improved gait efficiency   Baseline: 0.7 ft/s with RW and CGA Goal status: INITIAL  3.  Pt will perform single sit <>stand in under 45s w/mod verbal cues for improved independence and safety at home.  Baseline: 63s w/max verbal cues for proper sequencing  Goal status: INITIAL  4.  Pt will improve normal TUG to less than or equal to 80 seconds w/LRAD and mod verbal cues for improved functional mobility and decreased fall risk.  Baseline: 89.34sw/RW and max verbal cues  Goal status: REVISED  LONG TERM GOALS: Target date: 11/04/2022   Pt will  perform final HEP w/min A from daughter for improved strength, balance, transfers and gait.  Baseline:  Goal status: INITIAL  2.  Pt will improve normal TUG to less than or equal to 70  seconds w/LRAD and min verbal cues for improved functional mobility and decreased fall risk.  Baseline: 89.34sw/RW and max verbal cues Goal status: REVISED  3.  Pt will perform 2 sit <>stands in <40s w/min verbal cues for improved independence and carryover from PT to home  Baseline: 1 rep in 63s w/max verbal cues for proper sequencing  Goal status: INITIAL  4.  Pt will improve gait velocity to at least 1.1 ft/s with LRAD and S* for improved gait efficiency and safety  Baseline: 0.7 ft/s with RW Goal status: INITIAL    ASSESSMENT:  CLINICAL IMPRESSION: Patient seen for skilled PT session with emphasis on functional strength and walker management. Patient maintaining very shortened step length, NBOS, minimal B foot clearance and mid foot contact. Able to achieve appropriate gait mechanics for ~3-5 steps with Max verbal cues and attention to external visual cues before returning to previous gait pattern. Continue POC.    OBJECTIVE IMPAIRMENTS: Abnormal gait, decreased activity tolerance, decreased balance, decreased cognition, decreased coordination, decreased endurance, decreased knowledge of condition, decreased knowledge of use of DME, decreased mobility, difficulty walking, decreased strength, decreased safety awareness, impaired perceived functional ability, and improper body mechanics  ACTIVITY LIMITATIONS: carrying, lifting, sitting, standing, squatting, stairs, transfers, bathing, toileting, dressing, hygiene/grooming, and locomotion level  PARTICIPATION LIMITATIONS: meal prep, cleaning, laundry, medication management, personal finances, driving, shopping, community activity, and yard work  PERSONAL FACTORS: Age, Fitness, Past/current experiences, and 3+ comorbidities: basal ganglia CVA, HTN, seizures  are also affecting patient's functional outcome.   REHAB POTENTIAL: Fair due to severity of deficits and cognitive impairment  CLINICAL DECISION MAKING: Evolving/moderate  complexity  EVALUATION COMPLEXITY: Moderate  PLAN:  PT FREQUENCY: 2x/week  PT DURATION: 8 weeks  PLANNED INTERVENTIONS: Therapeutic exercises, Therapeutic activity, Neuromuscular re-education, Balance training, Gait training, Patient/Family education, Self Care, Joint mobilization, Stair training, Vestibular training, Canalith repositioning, DME instructions, Aquatic Therapy, Manual therapy, and Re-evaluation  PLAN FOR NEXT SESSION:  Check BP. add to HEP- lateral and retro stepping. Work on sit <>stands, increased step length, endurance, LE coordination, anterior weight shifting. Alt stepping over beam -fwd and lat    Debbora Dus, PT, DPT, CBIS 09/22/2022, 1:29 PM

## 2022-09-23 DIAGNOSIS — K219 Gastro-esophageal reflux disease without esophagitis: Secondary | ICD-10-CM | POA: Diagnosis not present

## 2022-09-23 DIAGNOSIS — I10 Essential (primary) hypertension: Secondary | ICD-10-CM | POA: Diagnosis not present

## 2022-09-23 DIAGNOSIS — N1832 Chronic kidney disease, stage 3b: Secondary | ICD-10-CM | POA: Diagnosis not present

## 2022-09-23 DIAGNOSIS — E782 Mixed hyperlipidemia: Secondary | ICD-10-CM | POA: Diagnosis not present

## 2022-09-23 DIAGNOSIS — I872 Venous insufficiency (chronic) (peripheral): Secondary | ICD-10-CM | POA: Diagnosis not present

## 2022-09-23 DIAGNOSIS — R1319 Other dysphagia: Secondary | ICD-10-CM | POA: Diagnosis not present

## 2022-09-23 DIAGNOSIS — R269 Unspecified abnormalities of gait and mobility: Secondary | ICD-10-CM | POA: Diagnosis not present

## 2022-09-23 DIAGNOSIS — I6389 Other cerebral infarction: Secondary | ICD-10-CM | POA: Diagnosis not present

## 2022-09-28 DIAGNOSIS — R269 Unspecified abnormalities of gait and mobility: Secondary | ICD-10-CM | POA: Diagnosis not present

## 2022-09-28 DIAGNOSIS — R1319 Other dysphagia: Secondary | ICD-10-CM | POA: Diagnosis not present

## 2022-09-28 DIAGNOSIS — N1832 Chronic kidney disease, stage 3b: Secondary | ICD-10-CM | POA: Diagnosis not present

## 2022-09-28 DIAGNOSIS — I6389 Other cerebral infarction: Secondary | ICD-10-CM | POA: Diagnosis not present

## 2022-09-28 DIAGNOSIS — E782 Mixed hyperlipidemia: Secondary | ICD-10-CM | POA: Diagnosis not present

## 2022-09-28 DIAGNOSIS — I872 Venous insufficiency (chronic) (peripheral): Secondary | ICD-10-CM | POA: Diagnosis not present

## 2022-09-28 DIAGNOSIS — I10 Essential (primary) hypertension: Secondary | ICD-10-CM | POA: Diagnosis not present

## 2022-09-28 DIAGNOSIS — K219 Gastro-esophageal reflux disease without esophagitis: Secondary | ICD-10-CM | POA: Diagnosis not present

## 2022-09-28 NOTE — Therapy (Signed)
OUTPATIENT OCCUPATIONAL THERAPY NEURO TREATMENT NOTE  Patient Name: Carlos Shields MRN: [id number] DOB:[birth year], 89 y.o., male Today's Date: 09/15/2022  PCP: Raelyn Number, PA REFERRING PROVIDER: Cathlyn Parsons, PA-C   END OF SESSION:  OT End of Session - 09/15/22 1149     Visit Number 1    Number of Visits 12    Date for OT Re-Evaluation 11/05/22    Authorization Type UHC    OT Start Time 3500    OT Stop Time 1232    OT Time Calculation (min) 43 min    Activity Tolerance Patient tolerated treatment well;No increased pain;Patient limited by lethargy;Patient limited by fatigue    Behavior During Therapy [facility] for tasks assessed/performed             Past Medical History:  Diagnosis Date   CKD (chronic kidney disease) stage 3, GFR 30-59 ml/min (HCC)    Gait abnormality    Hypertension    Pancreatitis    Seizures (HCC)    Venous insufficiency    Past Surgical History:  Procedure Laterality Date   HERNIA REPAIR     TRANSCAROTID ARTERY REVASCULARIZATION  Left 08/12/2022   Procedure: Transcarotid Artery Revascularization;  Surgeon: Waynetta Sandy, MD;  Location: [place];  Service: Vascular;  Laterality: Left;   Patient Active Problem List   Diagnosis Date Noted   CVA (cerebrovascular accident) ([place]) 08/14/2022   Acute-on-chronic kidney injury ([place]) 08/05/2022   CVA (cerebral vascular accident) ([place]) 08/05/2022   Dysphagia 08/05/2022   Abnormal TSH 93/81/8299   Acute metabolic encephalopathy 37/16/9678   CKD (chronic kidney disease), stage III ([place]) 04/26/2021   Weakness generalized 04/26/2021   Weakness 04/25/2021   Syncope 01/13/2012   Anemia 12/09/2011   Seizures ([place]) 12/08/2011   HTN (hypertension) 12/08/2011   Encephalopathy 12/08/2011    ONSET DATE: acute on chronic (discovered mini-strokes on 08/05/22)   REFERRING DIAG: I63.09 (ICD-10-CM) - Cerebrovascular accident (CVA) due to thrombosis of other precerebral artery ([place])   THERAPY DIAG:   Other lack of coordination  Muscle weakness (generalized)  Rationale for Evaluation and Treatment: Rehabilitation  PERTINENT HISTORY: Per hospital D/C note 08/25/22: "89 y.o. right-handed male with history of seizure maintained on Keppra, hypertension, CKD stage III, pancreatitis, venous insufficiency and dysphagia who was admitted 08/05/2022 for right-sided weakness, incontinence, seizure-like activity and fall...MRI of the brain 08/05/2022 which revealed multiple small acute infarcts involving the left basal ganglia, left frontal, parietal and occipital cortex and adjacent white matter with slight edema but no mass effect...underwent left transcarotid artery revascularization 08/12/2022 per Dr. Donzetta Matters." He was admitted to inpatient rehab and recently discharged on 08/27/22, and the hospital OT left this note about function at discharge: "Patient to discharge at overall Supervision/CGA level.  Patient's care partner is independent to provide the necessary physical and cognitive assistance at discharge.Marland KitchenMarland KitchenPatient still requires CGA for dynamic balance which affects LB dressing and toileting tasks.Marland KitchenMarland KitchenPatient will benefit from ongoing skilled OT services in outpatient setting to continue to advance functional skills in the area of BADL, Reduce care partner burden, and functional use of R UE."  Per OT eval: "He is accompanied by his daughter. He was having some falls, doing worse over time, until daughter took him to the hospital on 08/05/22.  It was determined there that he had had some subacute infarcts in the left lobe of the brain and basal ganglia.  She states he's had a bit worse function now than previously, and she moved him into her home as  she was concerned about his safety living alone.  She does the IADLs and assists with some BADL's."   PRECAUTIONS: Fall; WEIGHT BEARING RESTRICTIONS: No   SUBJECTIVE:   SUBJECTIVE STATEMENT: He & his daughter states ***  Pt accompanied by:  daughter    PAIN:   Are you having pain? *** Not now at rest Rating: 0/10 at rest now, up to 0/10 at worst in past week   FALLS: Has patient fallen in last 6 months? Yes. Number of falls "about 5 times"   LIVING ENVIRONMENT: Lives with: lives with their daughter Lives in: House/apartment Stairs: No Has following equipment at home: Environmental consultant - 2 wheeled, Electronics engineer, and adapted toilet  PLOF: Independent with basic ADLs, Independent with household mobility with device, and Independent with community mobility with device  PATIENT GOALS: to improve strength, motion, coordination in Rt side and with overall function, if possible    OBJECTIVE: (All objective assessments below are from initial evaluation on: 09/15/22 unless otherwise specified.)   HAND DOMINANCE: Right   ADLs: Overall ADLs: Daughter states she gives Mod A for dressing, has adapted toilet with rails and toilets himself, he sponge bathes with supervision, uses a walker at home and when out for mobility. He used to use the microwave, but hasn't after hospitalization. He now wets the bed more, which did not used to be so problematic.   FUNCTIONAL OUTCOME MEASURES: Eval: Quck DASH 25% impairment today  (Higher % Score  =  More Impairment)   UPPER EXTREMITY ROM     Shoulder to Wrist AROM Right eval Left eval Rt  /  Lt 09/29/22  Shoulder flexion 90 130 ***  /  ***  Shoulder abduction 90 130   Elbow flexion 130 155   Elbow extension 0 0   Forearm supination [facility] WFL   Forearm pronation  [facility] WFL   Wrist flexion Tight but functional Tight but functional   Wrist extension Tight but functional Tight but functional   (Blank rows = not tested)   Hand AROM Right eval Left eval  Full Fist Ability (or Gap to Distal Palmar Crease) full full  Thumb Opposition  (Kapandji Scale)  6 6  (Blank rows = not tested)   UPPER EXTREMITY MMT:     MMT Right 09/15/22 Left 09/15/22  Shoulder flexion 4/5 5/5  Elbow flexion 4/5 4+/5  Elbow extension 4/5 4+/5   (Blank rows = not tested)  HAND FUNCTION: Eval: Observed weakness in affected hand.  Grip strength Right: 48 lbs, Left: 57 lbs   COORDINATION: Eval: Observed coordination impairments with affected hand. Box and Blocks Test: Rt: 20 blocks, Lt 21 blocks today   COGNITION: Eval: Overall cognitive status: He was quiet and seemingly dependent on his daughter to answer most questions for him. He seemed to have at least mild recall/memory issues as well, and small problems following simple commands at times.   OBSERVATIONS:   Eval: Overall he presents as somewhat more stiff, slower coordination, and weaker in dominant right upper extremity today.   TODAY'S TREATMENT:  09/29/22: *** Check on initial home exercise program and recommendations, continue to perform functional activities to improve fine motor skills and coordination.  Perform strengthening and more distal upper body exercises and activities as needed.   Post-evaluation treatment: OT recommends to the family that he should perform as much seated functional activity as is appropriate and safe for him (like folding laundry and picking the stems off of strawberries, etc.).  It is  important to give him purpose and function to keep his body healthy and his mind healthy.  Next OT gives the following home exercise program focused largely on the shoulder and upper body today to improve range of motion and strength and coordination hopefully.  They were educated for him to do this 2 or 3 times a day as tolerated without adding any pain in a seated position that is safe for him.  He performs back with no pains and some fatigue.  Exercises - Seated Shoulder Flexion AAROM with Dowel  - 4-6 x daily - 1 sets - 10-15 reps - Seated Shoulder Abduction AAROM with Dowel  - 4-6 x daily - 1 sets - 10-15 reps - Seated Single Arm Shoulder Extension  - 4-6 x daily - 1 sets - 10-15 reps - Seated Lifting Hands Behind Back  - 4-6 x daily - 1 sets - 10-15  reps  PATIENT EDUCATION: Education details: See tx section above for details  Person educated: Patient Education method: Verbal Instruction, Teach back, Handouts  Education comprehension: States and demonstrates understanding, Additional Education required    HOME EXERCISE PROGRAM: Access Code: [email or web address] URL: [email or web address]  GOALS: Goals reviewed with patient? Yes   SHORT TERM GOALS: (STG required if POC>30 days) Target Date: 10/01/22  Pt will demo/state understanding of initial HEP to improve pain levels and prerequisite motion. Goal status: INITIAL   LONG TERM GOALS: Target Date: 11/05/22  Pt will improve functional ability by decreased impairment per Quick DASH assessment from 25% to 15% or better, for better quality of life. Goal status: INITIAL  2.  Pt will improve grip strength in Rt hand hand from 48lbs to at least 55lbs for functional use at home and in IADLs. Goal status: INITIAL  3.  Pt will improve A/ROM in Rt sh flex and abd from 90* each to at least 120* each, to have functional motion for tasks like reach and grasp.  Goal status: INITIAL  4.  Pt will improve strength in Rt elbow from 4/5 MMT to at least 4+/5 MMT to have increased functional ability to carry out selfcare and higher-level tasks with less difficulty. Goal status: INITIAL  5.  Pt will improve coordination skills in b/l UEs, as seen by better score (at least 30 blocks in both UEs) on BBT testing to have increased functional ability to carry out fine motor tasks (fasteners, etc.) and more complex, coordinated IADLs as able.  Goal status: INITIAL   ASSESSMENT:  CLINICAL IMPRESSION: 09/29/22: ***  Eval: Patient is a 89 y.o. male who was seen today for occupational therapy evaluation for gross general upper body weakness and stiffness more so on the right arm than the left arm after recent hospitalization and CVA infarcts.  He has also had a recent decrease in functional mobility  which puts increased strain on his daughter who is his caregiver.  He will benefit from outpatient occupational therapy to increase quality of life.   PERFORMANCE DEFICITS: in functional skills including ADLs, IADLs, coordination, dexterity, ROM, strength, flexibility, Fine motor control, mobility, balance, body mechanics, endurance, decreased knowledge of precautions, and UE functional use, cognitive skills including attention, energy/drive, memory, problem solving, safety awareness, and thought, and psychosocial skills including coping strategies, environmental adaptation, habits, and routines and behaviors.    PLAN:  OT FREQUENCY: 2x/week  OT DURATION: 8 weeks  PLANNED INTERVENTIONS: self care/ADL training, therapeutic exercise, therapeutic activity, neuromuscular re-education, manual therapy, passive range of motion, functional mobility training, compression bandaging, moist  heat, cryotherapy, contrast bath, patient/family education, cognitive remediation/compensation, visual/perceptual remediation/compensation, energy conservation, coping strategies training, and DME and/or AE instructions  CONSULTED AND AGREED WITH PLAN OF CARE: Patient and family member/caregiver  PLAN FOR NEXT SESSION:  ***   Benito Mccreedy, OT 09/15/2022, 5:19 PM

## 2022-09-29 ENCOUNTER — Encounter: Payer: Self-pay | Admitting: Rehabilitative and Restorative Service Providers"

## 2022-09-29 ENCOUNTER — Ambulatory Visit: Payer: Medicare Other | Admitting: Physical Therapy

## 2022-09-29 ENCOUNTER — Ambulatory Visit: Payer: Medicare Other | Admitting: Rehabilitative and Restorative Service Providers"

## 2022-09-29 DIAGNOSIS — M6281 Muscle weakness (generalized): Secondary | ICD-10-CM | POA: Diagnosis not present

## 2022-09-29 DIAGNOSIS — R2681 Unsteadiness on feet: Secondary | ICD-10-CM

## 2022-09-29 DIAGNOSIS — R278 Other lack of coordination: Secondary | ICD-10-CM

## 2022-09-29 DIAGNOSIS — R2689 Other abnormalities of gait and mobility: Secondary | ICD-10-CM

## 2022-09-29 NOTE — Therapy (Signed)
OUTPATIENT PHYSICAL THERAPY NEURO TREATMENT    Patient Name: Carlos Shields MRN: [id number] DOB:[birth year], 89 y.o., male Today's Date: 09/29/2022   PCP: [name], [place] REFERRING PROVIDER: Cathlyn Parsons, PA-C  END OF SESSION:  PT End of Session - 09/29/22 1105     Visit Number 6    Number of Visits 17    Date for PT Re-Evaluation 11/04/22    Authorization Type [employer] Medicare    Progress Note Due on Visit 10    PT Start Time 1105   from OT session   PT Stop Time 1144    PT Time Calculation (min) 39 min    Equipment Utilized During Treatment Gait belt    Activity Tolerance Patient tolerated treatment well    Behavior During Therapy WFL for tasks assessed/performed                Past Medical History:  Diagnosis Date   CKD (chronic kidney disease) stage 3, GFR 30-59 ml/min (HCC)    Gait abnormality    Hypertension    Pancreatitis    Seizures (HCC)    Venous insufficiency    Past Surgical History:  Procedure Laterality Date   HERNIA REPAIR     TRANSCAROTID ARTERY REVASCULARIZATION  Left 08/12/2022   Procedure: Transcarotid Artery Revascularization;  Surgeon: Waynetta Sandy, MD;  Location: [facility];  Service: Vascular;  Laterality: Left;   Patient Active Problem List   Diagnosis Date Noted   CVA (cerebrovascular accident) ([place]) 08/14/2022   Acute-on-chronic kidney injury ([place]) 08/05/2022   CVA (cerebral vascular accident) ([place]) 08/05/2022   Dysphagia 08/05/2022   Abnormal TSH 09/38/1829   Acute metabolic encephalopathy 93/71/6967   CKD (chronic kidney disease), stage III ([place]) 04/26/2021   Weakness generalized 04/26/2021   Weakness 04/25/2021   Syncope 01/13/2012   Anemia 12/09/2011   Seizures ([place]) 12/08/2011   HTN (hypertension) 12/08/2011   Encephalopathy 12/08/2011    ONSET DATE: 08/25/2022  REFERRING DIAG: I63.09 (ICD-10-CM) - Cerebrovascular accident (CVA) due to thrombosis of other precerebral artery ([place])  THERAPY DIAG:  Muscle  weakness (generalized)  Unsteadiness on feet  Other abnormalities of gait and mobility  Rationale for Evaluation and Treatment: Rehabilitation  SUBJECTIVE:                                                                                                                                                                                             SUBJECTIVE STATEMENT: Pt reports no falls or acute changes. Pt reports HEP is going well, remains a good challenge.  Pt accompanied by: self and daughter, Karie Kirks    PERTINENT  HISTORY: He underwent MRI of the brain 08/05/2022 which revealed multiple small acute infarcts involving the left basal ganglia, left frontal, parietal and occipital cortex and adjacent white matter with slight edema but no mass effect. He was admitted and underwent MRI of the head 08/06/2022 which showed intracranial atherosclerotic disease with associated moderate stenosis at the left carotid siphon with additional severe distal L P3 stenosis. Vascular surgery consulted he underwent CTA of the neck 08/08/2022 which showed proximal left ICA stenosis 75% and 65% on the right and subsequently underwent left transcarotid artery revascularization 08/12/2022 per Dr. Donzetta Matters. PMH: Seizure maintained on Keppra, hypertension, CKD stage III, pancreatitis, venous insufficiency and dysphagia who was admitted 08/05/2022 for right-sided weakness, incontinence, seizure-like activity and fall.  PAIN:  Are you having pain? No  VITALS BP: 126/79, HR: 57  Vitals assessed while seated at beginning of therapy session   PRECAUTIONS: Fall and Other: seizure   PATIENT GOALS: per daughter, pt would like to work on walking   TODAY'S TREATMENT:   NMR: In // bars with BUE support to work on increasing step length and LE clearance during gait: Ambulation over 4" hurdles with alt L/R 4 x 10 ft Alt L/R 2" foam beam step overs 2 x 10 reps each Gait in // bars with colored dots on floor as visual cue to  increase step length Followed by gait around therapy gym with RW with focus on increasing step length (fair ability to maintain, even with verbal and visual cues) Cone weave with RW 2 x 10 ft, pt struggles to keep RW close to body and also avoid obstacles  Sit to stand x 10 reps no AD pushing with one UE support from mat table with CGA, cues to not brace LE against mat table                                                                                               PATIENT EDUCATION: Education details: continue HEP, continue to use RTB on RW as a visual cue to say inside RW during gait Person educated: Patient and Child(ren) Education method: Explanation, Demonstration, Tactile cues, and Verbal cues Education comprehension: verbal cues required, tactile cues required, and needs further education  HOME EXERCISE PROGRAM: Access Code: [email or web address] URL: [email or web address] Date: 09/13/2022 Prepared by: Mickie Bail Plaster  Exercises - Standing Quarter Turn with Counter Support  - 1 x daily - 7 x weekly - 3 sets - 10 reps - Sit to Stand with Armchair  - 1 x daily - 7 x weekly - 3 sets - 10 reps  GOALS: Goals reviewed with patient? Yes  SHORT TERM GOALS: Target date: 10/07/2022   Pt will perform initial HEP w/mod A from daughter for improved balance, safety and strength Baseline: not established on eval  Goal status: INITIAL  2.  Pt will improve gait velocity to at least 0.9 ft/s with LRAD and CGA for improved gait efficiency   Baseline: 0.7 ft/s with RW and CGA Goal status: INITIAL  3.  Pt will perform single sit <>stand in under 45s w/mod verbal cues for improved independence and safety  at home.  Baseline: 63s w/max verbal cues for proper sequencing  Goal status: INITIAL  4.  Pt will improve normal TUG to less than or equal to 80 seconds w/LRAD and mod verbal cues for improved functional mobility and decreased fall risk.  Baseline: 89.34sw/RW and max verbal cues  Goal  status: REVISED  LONG TERM GOALS: Target date: 11/04/2022   Pt will perform final HEP w/min A from daughter for improved strength, balance, transfers and gait.  Baseline:  Goal status: INITIAL  2.  Pt will improve normal TUG to less than or equal to 70 seconds w/LRAD and min verbal cues for improved functional mobility and decreased fall risk.  Baseline: 89.34sw/RW and max verbal cues Goal status: REVISED  3.  Pt will perform 2 sit <>stands in <40s w/min verbal cues for improved independence and carryover from PT to home  Baseline: 1 rep in 63s w/max verbal cues for proper sequencing  Goal status: INITIAL  4.  Pt will improve gait velocity to at least 1.1 ft/s with LRAD and S* for improved gait efficiency and safety  Baseline: 0.7 ft/s with RW Goal status: INITIAL    ASSESSMENT:  CLINICAL IMPRESSION: Emphasis of skilled PT session on continuing to work on increasing B step length and clearance during gait. Pt exhibits improved ability to increase step length in // bars with visual targets for increased step height and step length, however pt exhibits poor carryover to gait with his RW. Pt continues to benefit from skilled therapy services to address ongoing gait and balance impairments leading to increased fall risk. Continue POC.    OBJECTIVE IMPAIRMENTS: Abnormal gait, decreased activity tolerance, decreased balance, decreased cognition, decreased coordination, decreased endurance, decreased knowledge of condition, decreased knowledge of use of DME, decreased mobility, difficulty walking, decreased strength, decreased safety awareness, impaired perceived functional ability, and improper body mechanics  ACTIVITY LIMITATIONS: carrying, lifting, sitting, standing, squatting, stairs, transfers, bathing, toileting, dressing, hygiene/grooming, and locomotion level  PARTICIPATION LIMITATIONS: meal prep, cleaning, laundry, medication management, personal finances, driving, shopping,  community activity, and yard work  PERSONAL FACTORS: Age, Fitness, Past/current experiences, and 3+ comorbidities: basal ganglia CVA, HTN, seizures  are also affecting patient's functional outcome.   REHAB POTENTIAL: Fair due to severity of deficits and cognitive impairment  CLINICAL DECISION MAKING: Evolving/moderate complexity  EVALUATION COMPLEXITY: Moderate  PLAN:  PT FREQUENCY: 2x/week  PT DURATION: 8 weeks  PLANNED INTERVENTIONS: Therapeutic exercises, Therapeutic activity, Neuromuscular re-education, Balance training, Gait training, Patient/Family education, Self Care, Joint mobilization, Stair training, Vestibular training, Canalith repositioning, DME instructions, Aquatic Therapy, Manual therapy, and Re-evaluation  PLAN FOR NEXT SESSION:  Check BP. add to HEP- lateral and retro stepping. Work on sit <>stands, increased step length, endurance, LE coordination, anterior weight shifting. Alt stepping over beam -fwd and lat    Excell Seltzer, PT, DPT, CSRS 09/29/2022, 11:44 AM

## 2022-09-30 ENCOUNTER — Encounter: Payer: Self-pay | Admitting: Neurology

## 2022-09-30 ENCOUNTER — Ambulatory Visit: Payer: Medicare Other | Admitting: Neurology

## 2022-09-30 VITALS — BP 122/65 | HR 65 | Ht 64.0 in | Wt 120.5 lb

## 2022-09-30 DIAGNOSIS — G40909 Epilepsy, unspecified, not intractable, without status epilepticus: Secondary | ICD-10-CM

## 2022-09-30 DIAGNOSIS — R269 Unspecified abnormalities of gait and mobility: Secondary | ICD-10-CM | POA: Diagnosis not present

## 2022-09-30 DIAGNOSIS — I639 Cerebral infarction, unspecified: Secondary | ICD-10-CM | POA: Diagnosis not present

## 2022-09-30 DIAGNOSIS — Z5181 Encounter for therapeutic drug level monitoring: Secondary | ICD-10-CM

## 2022-09-30 DIAGNOSIS — F03A0 Unspecified dementia, mild, without behavioral disturbance, psychotic disturbance, mood disturbance, and anxiety: Secondary | ICD-10-CM | POA: Diagnosis not present

## 2022-09-30 MED ORDER — MEMANTINE HCL 10 MG PO TABS: 10.0000 mg | ORAL_TABLET | Freq: Two times a day (BID) | ORAL | 3 refills | Status: DC

## 2022-09-30 NOTE — Patient Instructions (Addendum)
Discontinue Aspirin  Continue with Plavix 75 mg daily  Increase Namenda to 10 mg twice daily  Continue your other medications Return in a year or sooner if worse

## 2022-09-30 NOTE — Progress Notes (Signed)
[facility]  PATIENT: Carlos Shields DOB: April 01, 1931  REQUESTING CLINICIAN: Cathlyn Parsons, PA-C HISTORY FROM: Daughter and chart review  REASON FOR VISIT: CVA and Seizure    HISTORICAL  CHIEF COMPLAINT:  Chief Complaint  Patient presents with   Hospitalization Follow-up    Rm 12. Accompanied by daughter. NP Hospital f/u for seizures, CVA - discharged home 2/16.    HISTORY OF PRESENT ILLNESS:  This is a 89 year old gentleman past medical history of dementia, hypertension, hyperlipidemia, seizure disorder, and stroke who is presenting for follow-up after hospitalization.  Patient was recently admitted to the hospital for left hemispheric strokes.  He was also found to have a left carotid occlusion which was revascularized by Dr. Donzetta Matters. His stroke etiology was likely large vessel occlusion. Patient completed aspirin and Plavix for total of 3 weeks and currently on Plavix alone.  Unclear if daughter is still giving him aspirin.  For his seizure, he was on Keppra 500 mg twice daily, and on March 11 he had a breakthrough seizure described by daughter as eyes rolled back with convulsion. There was also post ictal confusion as patient attempted to take his clothes off and walk away. Marland Kitchen  He presented to the ED, the Keppra level was not checked at that time but plan was to increase Keppra to 750 mg twice daily.  Since being on the 750 mg twice daily no additional seizures and no side effect from the medicine.  He continued to receive PT OT and speech.  Daughter denies any recent fall    Hospital course and summary  [name] is a 89 y.o. right-handed male with history of seizure maintained on Keppra, hypertension, CKD stage III, pancreatitis, venous insufficiency and dysphagia who was admitted 08/05/2022 for right-sided weakness, incontinence, seizure-like activity and fall.  He had been on his Keppra and aspirin.  In the ED his workup was notable for creatinine 2.1 hemoglobin 9.5  TSH 4.922, hemoglobin A1c 5.3.  He underwent MRI of the brain 08/05/2022 which revealed multiple small acute infarcts involving the left basal ganglia, left frontal, parietal and occipital cortex and adjacent white matter with slight edema but no mass effect.  He was admitted and underwent MRI of the head 08/06/2022 which showed intracranial atherosclerotic disease with associated moderate stenosis at the left carotid siphon with additional severe distal L P3 stenosis.  Vascular surgery consulted he underwent CTA of the neck 08/08/2022 which showed proximal left ICA stenosis 75% and 65% on the right and subsequently underwent left transcarotid artery revascularization 08/12/2022 per Dr. Donzetta Matters.  Lipid panel done 08/06/2022 showing LDL 69 cholesterol 143 started on Crestor during hospitalization.  Echocardiogram preserved EF ejection fraction 60 to [id number] but LV diastolic parameters grade 1 diastolic dysfunction.  He resumed his aspirin as prior to admission and started on Plavix daily 08/08/2018 for which she would continue for 3 weeks then Plavix alone.  Therapy evaluations completed due to patient decreased functional mobility/right-sided weakness was admitted for a comprehensive rehab program.   Nolton Marcheschi was admitted to rehab 08/14/2022 for inpatient therapies to consist of PT, ST and OT at least three hours five days a week. Past admission physiatrist, therapy team and rehab RN have worked together to provide customized collaborative inpatient rehab.  Pertaining to patient's acute ischemic CVA infarcts involving left basal ganglia left frontal right occipital cortex and adjacent white matter remained stable he would follow-up with neurology services.  He remained on aspirin and Plavix for CVA prophylaxis through  08/29/2022 then Plavix alone.  Noted history of dysphagia he continued on dysphagia #1 thin liquid diet.  Keppra ongoing for history of seizure no seizure activity.  Lovenox for DVT prophylaxis no bleeding episodes.   Left internal carotid artery stenosis status post TCAR 08/12/2022 followed by Dr. Donzetta Matters.  Pain management use of oxycodone as needed.  Crestor ongoing for hyperlipidemia.  Blood pressure monitored permissive hypertension maintained on low-dose Lopressor and home Norvasc resumed for now and would need outpatient follow-up.  Acute on chronic CKD stage III with latest creatinine 1.67.  Hospital course findings of elevated TSH 4.9 on admission Free T40.88 Within normal limits follow-up PCP.     OTHER MEDICAL CONDITIONS: Dementia, hypertension, hyperlipidemia, epilepsy    REVIEW OF SYSTEMS: Full 14 system review of systems performed and negative with exception of: Unable to fully obtain   ALLERGIES: No Known Allergies  HOME MEDICATIONS: Outpatient Medications Prior to Visit  Medication Sig Dispense Refill   acetaminophen (TYLENOL) 325 MG tablet Take 1-2 tablets (325-650 mg total) by mouth every 4 (four) hours as needed for mild pain.     amLODipine (NORVASC) 5 MG tablet Take 1 tablet (5 mg total) by mouth daily. 30 tablet 0   clopidogrel (PLAVIX) 75 MG tablet Take 1 tablet (75 mg total) by mouth daily. 30 tablet 0   COMIRNATY SUSP injection      levETIRAcetam (KEPPRA) 750 MG tablet Take 1 tablet (750 mg total) by mouth 2 (two) times daily. 60 tablet 0   metoprolol tartrate (LOPRESSOR) 25 MG tablet Take 0.5 tablets (12.5 mg total) by mouth 2 (two) times daily. 60 tablet 0   pantoprazole (PROTONIX) 40 MG tablet Take 1 tablet (40 mg total) by mouth daily. 30 tablet 0   polyethylene glycol (MIRALAX / GLYCOLAX) 17 g packet Take 17 g by mouth daily. 14 each 0   rosuvastatin (CRESTOR) 10 MG tablet Take 1 tablet (10 mg total) by mouth daily. 30 tablet 0   aspirin EC 81 MG tablet Take 81 mg by mouth daily. Swallow whole.     levETIRAcetam (KEPPRA) 500 MG tablet Take 750 mg by mouth 2 (two) times daily.     memantine (NAMENDA) 5 MG tablet Take 1 tablet (5 mg total) by mouth daily. 30 tablet 0   No  facility-administered medications prior to visit.    PAST MEDICAL HISTORY: Past Medical History:  Diagnosis Date   CKD (chronic kidney disease) stage 3, GFR 30-59 ml/min (HCC)    Gait abnormality    Hypertension    Pancreatitis    Seizures (HCC)    Venous insufficiency     PAST SURGICAL HISTORY: Past Surgical History:  Procedure Laterality Date   HERNIA REPAIR     TRANSCAROTID ARTERY REVASCULARIZATION  Left 08/12/2022   Procedure: Transcarotid Artery Revascularization;  Surgeon: Waynetta Sandy, MD;  Location: [facility];  Service: Vascular;  Laterality: Left;    FAMILY HISTORY: Family History  Problem Relation Age of Onset   Hypertension Other    Diabetes Other    Asthma Other     SOCIAL HISTORY: Social History   Socioeconomic History   Marital status: Divorced    Spouse name: Not on file   Number of children: 2   Years of education: Not on file   Highest education level: Not on file  Occupational History   Occupation: retired  Tobacco Use   Smoking status: Former    Types: Cigars   Smokeless tobacco: Never  Media planner  Vaping Use: Never used  Substance and Sexual Activity   Alcohol use: No   Drug use: No   Sexual activity: Never  Other Topics Concern   Not on file  Social History Narrative   Not on file   Social Determinants of Health   Financial Resource Strain: Not on file  Food Insecurity: No Food Insecurity (08/06/2022)   Hunger Vital Sign    Worried About Running Out of Food in the Last Year: Never true    Ran Out of Food in the Last Year: Never true  Transportation Needs: No Transportation Needs (08/06/2022)   PRAPARE - Hydrologist (Medical): No    Lack of Transportation (Non-Medical): No  Physical Activity: Not on file  Stress: Not on file  Social Connections: Not on file  Intimate Partner Violence: Not At Risk (08/06/2022)   Humiliation, Afraid, Rape, and Kick questionnaire    Fear of Current or  Ex-Partner: No    Emotionally Abused: No    Physically Abused: No    Sexually Abused: No    PHYSICAL EXAM  GENERAL EXAM/CONSTITUTIONAL: Vitals:  Vitals:   09/30/22 0839  BP: 122/65  Pulse: 65  Weight: 120 lb 8 oz (54.7 kg)  Height: 5\' 4"  (1.626 m)   Body mass index is 20.68 kg/m. Wt Readings from Last 3 Encounters:  09/30/22 120 lb 8 oz (54.7 kg)  09/20/22 116 lb 13.5 oz (53 kg)  09/15/22 118 lb (53.5 kg)   Patient is in no distress; well developed, nourished and groomed; neck is supple  EYES: Visual fields full to confrontation, Extraocular movements intacts,   MUSCULOSKELETAL: Gait, strength, tone, movements noted in Neurologic exam below  NEUROLOGIC: MENTAL STATUS:      No data to display         awake, alert, oriented to person, able to tell me the month but not day or year Couldn't remember recent hospitalization.  Able to name simple object  Able to count the number of quarter in $1 but not $2.     CRANIAL NERVE:  2nd, 3rd, 4th, 6th - Visual fields full to confrontation, extraocular muscles intact, no nystagmus 5th - facial sensation symmetric 7th - facial strength symmetric 8th - hearing intact 9th - palate elevates symmetrically, uvula midline 11th - shoulder shrug symmetric 12th - tongue protrusion midline  MOTOR:  normal bulk and tone, Subtle weakness in the RUE, right pronator  SENSORY:  normal and symmetric to light touch  COORDINATION:  finger-nose-finger, fine finger movements normal  REFLEXES:  deep tendon reflexes present and symmetric  GAIT/STATION:  Uses a walker      DIAGNOSTIC DATA (LABS, IMAGING, TESTING) - I reviewed patient records, labs, notes, testing and imaging myself where available.  Lab Results  Component Value Date   WBC 5.5 09/20/2022   HGB 9.7 (L) 09/20/2022   HCT 30.9 (L) 09/20/2022   MCV 92.2 09/20/2022   PLT 164 09/20/2022      Component Value Date/Time   NA 138 09/20/2022 1747   K 4.1  09/20/2022 1747   CL 105 09/20/2022 1747   CO2 22 09/20/2022 1747   GLUCOSE 108 (H) 09/20/2022 1747   BUN 20 09/20/2022 1747   CREATININE 1.77 (H) 09/20/2022 1747   CALCIUM 9.2 09/20/2022 1747   PROT 7.4 09/20/2022 1747   ALBUMIN 3.9 09/20/2022 1747   AST 27 09/20/2022 1747   ALT 13 09/20/2022 1747   ALKPHOS 64 09/20/2022 1747  BILITOT 0.6 09/20/2022 1747   GFRNONAA 36 (L) 09/20/2022 1747   GFRAA 51 (L) 11/19/2019 2118   Lab Results  Component Value Date   CHOL 143 08/06/2022   HDL 69 08/06/2022   LDLCALC 69 08/06/2022   TRIG 27 08/06/2022   CHOLHDL 2.1 08/06/2022   Lab Results  Component Value Date   HGBA1C 5.3 08/05/2022   Lab Results  Component Value Date   VITAMINB12 443 05/04/2013   Lab Results  Component Value Date   TSH 4.922 (H) 08/05/2022    MRI Brain 08/05/2022 1. Multiple small acute infarcts involving the left basal ganglia, left frontal, parietal and occipital cortex and adjacent white matter. Slight edema without mass effect. 2. Advanced brain atrophy (ICD10-G31.9). and chronic microvascular ischemic diseas   EEG 04/27/2021 This study is within normal limits. No seizures or epileptiform discharges were seen throughout the recording.    ASSESSMENT AND PLAN  89 y.o. year old male with Dementia, hypertension, hyperlipidemia, seizure disorder, and stroke who is presenting for follow-up for stroke and seizures.  Patient stroke etiology likely large vessel disease.  He is doing much better after revascularization.  He completed DAPT, aspirin and Plavix for total of 3 weeks and now on Plavix alone.  Plan was for patient to continue current medications. When he comes to the seizures.  He is on Keppra 750 mg twice daily, I will obtain a level today we will continue patient on on Keppra.  For his Dementia, he is on Namenda 5 mg daily, will increase it to 10 mg twice daily.  I will see him in 1 year for follow-up or sooner if worse.   1. Cerebrovascular  accident (CVA), unspecified mechanism ([place])   2. Seizure disorder ([place])   3. Mild dementia without behavioral disturbance, psychotic disturbance, mood disturbance, or anxiety, unspecified dementia type ([place])   4. Therapeutic drug monitoring   5. Gait abnormality      Patient Instructions  Discontinue Aspirin  Continue with Plavix 75 mg daily  Increase Namenda to 10 mg twice daily  Continue your other medications Return in a year or sooner if worse   Orders Placed This Encounter  Procedures   Levetiracetam level    Meds ordered this encounter  Medications   memantine (NAMENDA) 10 MG tablet    Sig: Take 1 tablet (10 mg total) by mouth 2 (two) times daily.    Dispense:  180 tablet    Refill:  3    Return in about 1 year (around 09/30/2023).    Alric Ran, MD 09/30/2022, 12:42 PM  [facility] [address] [place], [place] [address] [phone]

## 2022-10-01 ENCOUNTER — Ambulatory Visit: Payer: Medicare Other

## 2022-10-01 DIAGNOSIS — M6281 Muscle weakness (generalized): Secondary | ICD-10-CM

## 2022-10-01 DIAGNOSIS — R2681 Unsteadiness on feet: Secondary | ICD-10-CM

## 2022-10-01 DIAGNOSIS — R278 Other lack of coordination: Secondary | ICD-10-CM | POA: Diagnosis not present

## 2022-10-01 DIAGNOSIS — R2689 Other abnormalities of gait and mobility: Secondary | ICD-10-CM | POA: Diagnosis not present

## 2022-10-01 NOTE — Therapy (Signed)
OUTPATIENT PHYSICAL THERAPY NEURO TREATMENT    Patient Name: Carlos Shields MRN: [id number] DOB:Dec 12, 1930, 89 y.o., male Today's Date: 10/01/2022   PCP: [name], [place] REFERRING PROVIDER: Cathlyn Parsons, PA-C  END OF SESSION:  PT End of Session - 10/01/22 1236     Visit Number 7    Number of Visits 17    Date for PT Re-Evaluation 11/04/22    Authorization Type [employer] Medicare    Progress Note Due on Visit 10    PT Start Time 1232    PT Stop Time 1314    PT Time Calculation (min) 42 min    Equipment Utilized During Treatment Gait belt    Activity Tolerance Patient tolerated treatment well    Behavior During Therapy WFL for tasks assessed/performed                Past Medical History:  Diagnosis Date   CKD (chronic kidney disease) stage 3, GFR 30-59 ml/min (HCC)    Gait abnormality    Hypertension    Pancreatitis    Seizures ([place])    Venous insufficiency    Past Surgical History:  Procedure Laterality Date   HERNIA REPAIR     TRANSCAROTID ARTERY REVASCULARIZATION  Left 08/12/2022   Procedure: Transcarotid Artery Revascularization;  Surgeon: Waynetta Sandy, MD;  Location: [place];  Service: Vascular;  Laterality: Left;   Patient Active Problem List   Diagnosis Date Noted   CVA (cerebrovascular accident) ([place]) 08/14/2022   Acute-on-chronic kidney injury ([place]) 08/05/2022   CVA (cerebral vascular accident) ([place]) 08/05/2022   Dysphagia 08/05/2022   Abnormal TSH 123456   Acute metabolic encephalopathy 123456   CKD (chronic kidney disease), stage III ([place]) 04/26/2021   Weakness generalized 04/26/2021   Weakness 04/25/2021   Syncope 01/13/2012   Anemia 12/09/2011   Seizures ([place]) 12/08/2011   HTN (hypertension) 12/08/2011   Encephalopathy 12/08/2011    ONSET DATE: 08/25/2022  REFERRING DIAG: I63.09 (ICD-10-CM) - Cerebrovascular accident (CVA) due to thrombosis of other precerebral artery ([place])  THERAPY DIAG:  Muscle weakness  (generalized)  Unsteadiness on feet  Other abnormalities of gait and mobility  Other lack of coordination  Rationale for Evaluation and Treatment: Rehabilitation  SUBJECTIVE:                                                                                                                                                                                             SUBJECTIVE STATEMENT: Patient reports doing well. Went to MD yesterday and had some meds updated. Denies falls/near falls.   Pt accompanied by: self and daughter, Karie Kirks  PERTINENT HISTORY: He underwent MRI of the brain 08/05/2022 which revealed multiple small acute infarcts involving the left basal ganglia, left frontal, parietal and occipital cortex and adjacent white matter with slight edema but no mass effect. He was admitted and underwent MRI of the head 08/06/2022 which showed intracranial atherosclerotic disease with associated moderate stenosis at the left carotid siphon with additional severe distal L P3 stenosis. Vascular surgery consulted he underwent CTA of the neck 08/08/2022 which showed proximal left ICA stenosis 75% and 65% on the right and subsequently underwent left transcarotid artery revascularization 08/12/2022 per Dr. Donzetta Matters. PMH: Seizure maintained on Keppra, hypertension, CKD stage III, pancreatitis, venous insufficiency and dysphagia who was admitted 08/05/2022 for right-sided weakness, incontinence, seizure-like activity and fall.  PAIN:  Are you having pain? No  VITALS BP: 144/68 HR: 61 Vitals assessed while seated at beginning of therapy session   PRECAUTIONS: Fall and Other: seizure   PATIENT GOALS: per daughter, pt would like to work on walking   TODAY'S TREATMENT:   NMR: -scifit level 2 x10 mins B UE/LE for increased amplitude of movement  -B UE support on counter marching  -lateral stepping at counter with B UE + Max tactile and verbal cues for form  - in // bars:  -stepping to targets with  midline barrier in place to encourage wider, longer steps                                                                                               PATIENT EDUCATION: Education details: continue HEP Person educated: Patient and Child(ren) Education method: Explanation, Demonstration, Tactile cues, and Verbal cues Education comprehension: verbal cues required, tactile cues required, and needs further education  HOME EXERCISE PROGRAM: Access Code: [email or web address] URL: [email or web address] Date: 09/13/2022 Prepared by: Mickie Bail Plaster  Exercises - Standing Quarter Turn with Counter Support  - 1 x daily - 7 x weekly - 3 sets - 10 reps - Sit to Stand with Armchair  - 1 x daily - 7 x weekly - 3 sets - 10 reps  GOALS: Goals reviewed with patient? Yes  SHORT TERM GOALS: Target date: 10/07/2022   Pt will perform initial HEP w/mod A from daughter for improved balance, safety and strength Baseline: not established on eval  Goal status: INITIAL  2.  Pt will improve gait velocity to at least 0.9 ft/s with LRAD and CGA for improved gait efficiency   Baseline: 0.7 ft/s with RW and CGA Goal status: INITIAL  3.  Pt will perform single sit <>stand in under 45s w/mod verbal cues for improved independence and safety at home.  Baseline: 63s w/max verbal cues for proper sequencing  Goal status: INITIAL  4.  Pt will improve normal TUG to less than or equal to 80 seconds w/LRAD and mod verbal cues for improved functional mobility and decreased fall risk.  Baseline: 89.34sw/RW and max verbal cues  Goal status: REVISED  LONG TERM GOALS: Target date: 11/04/2022   Pt will perform final HEP w/min A from daughter for improved strength, balance, transfers and gait.  Baseline:  Goal status: INITIAL  2.  Pt will improve normal TUG to less than or equal to 70 seconds w/LRAD and min verbal cues for improved functional mobility and decreased fall risk.  Baseline: 89.34sw/RW and max verbal  cues Goal status: REVISED  3.  Pt will perform 2 sit <>stands in <40s w/min verbal cues for improved independence and carryover from PT to home  Baseline: 1 rep in 63s w/max verbal cues for proper sequencing  Goal status: INITIAL  4.  Pt will improve gait velocity to at least 1.1 ft/s with LRAD and S* for improved gait efficiency and safety  Baseline: 0.7 ft/s with RW Goal status: INITIAL    ASSESSMENT:  CLINICAL IMPRESSION: Patient seen for skilled PT session with emphasis on gait retraining. Continues to require grossly Max verbal and tactile cuing to complete tasks accurately and follow through on cues. Given gait training, patient with minimal to no carryover when exiting gym. Continue POC.    OBJECTIVE IMPAIRMENTS: Abnormal gait, decreased activity tolerance, decreased balance, decreased cognition, decreased coordination, decreased endurance, decreased knowledge of condition, decreased knowledge of use of DME, decreased mobility, difficulty walking, decreased strength, decreased safety awareness, impaired perceived functional ability, and improper body mechanics  ACTIVITY LIMITATIONS: carrying, lifting, sitting, standing, squatting, stairs, transfers, bathing, toileting, dressing, hygiene/grooming, and locomotion level  PARTICIPATION LIMITATIONS: meal prep, cleaning, laundry, medication management, personal finances, driving, shopping, community activity, and yard work  PERSONAL FACTORS: Age, Fitness, Past/current experiences, and 3+ comorbidities: basal ganglia CVA, HTN, seizures  are also affecting patient's functional outcome.   REHAB POTENTIAL: Fair due to severity of deficits and cognitive impairment  CLINICAL DECISION MAKING: Evolving/moderate complexity  EVALUATION COMPLEXITY: Moderate  PLAN:  PT FREQUENCY: 2x/week  PT DURATION: 8 weeks  PLANNED INTERVENTIONS: Therapeutic exercises, Therapeutic activity, Neuromuscular re-education, Balance training, Gait training,  Patient/Family education, Self Care, Joint mobilization, Stair training, Vestibular training, Canalith repositioning, DME instructions, Aquatic Therapy, Manual therapy, and Re-evaluation  PLAN FOR NEXT SESSION:  Check BP. add to HEP- lateral and retro stepping. Work on sit <>stands, increased step length, endurance, LE coordination, anterior weight shifting. Alt stepping over beam -fwd and lat    Debbora Dus, PT, DPT, CBIS 10/01/2022, 1:15 PM

## 2022-10-02 LAB — LEVETIRACETAM LEVEL: Levetiracetam Lvl: 73.8 ug/mL — ABNORMAL HIGH (ref 10.0–40.0)

## 2022-10-04 NOTE — Progress Notes (Signed)
Please call and advise the patient/daughter that the recent Keppra level was elevated. Please advise daughter to monitor patient and he develops worsening fatigue and increase somnolence, to call the office, at that time, we will likely recheck the Keppra level and decrease it if needed. Please remind patient to keep any upcoming appointments or tests and to call us with any interim questions, concerns, problems or updates. Thanks,   Alric Ran, MD

## 2022-10-06 ENCOUNTER — Ambulatory Visit: Payer: Medicare Other | Admitting: Occupational Therapy

## 2022-10-06 ENCOUNTER — Ambulatory Visit: Payer: Medicare Other | Admitting: Physical Therapy

## 2022-10-07 ENCOUNTER — Encounter: Payer: Self-pay | Admitting: Occupational Therapy

## 2022-10-07 ENCOUNTER — Ambulatory Visit: Payer: Medicare Other | Admitting: Physical Therapy

## 2022-10-07 ENCOUNTER — Ambulatory Visit: Payer: Medicare Other | Admitting: Occupational Therapy

## 2022-10-07 VITALS — BP 146/66 | HR 65

## 2022-10-07 DIAGNOSIS — R2681 Unsteadiness on feet: Secondary | ICD-10-CM

## 2022-10-07 DIAGNOSIS — R278 Other lack of coordination: Secondary | ICD-10-CM | POA: Diagnosis not present

## 2022-10-07 DIAGNOSIS — R2689 Other abnormalities of gait and mobility: Secondary | ICD-10-CM | POA: Diagnosis not present

## 2022-10-07 DIAGNOSIS — M6281 Muscle weakness (generalized): Secondary | ICD-10-CM | POA: Diagnosis not present

## 2022-10-07 NOTE — Therapy (Signed)
OUTPATIENT PHYSICAL THERAPY NEURO TREATMENT    Patient Name: Carlos Shields MRN: [id number] DOB:Nov 06, 1930, 89 y.o., male Today's Date: 10/07/2022   PCP: [name], [place] REFERRING PROVIDER: Cathlyn Parsons, PA-C  END OF SESSION:  PT End of Session - 10/07/22 1405     Visit Number 8    Number of Visits 17    Date for PT Re-Evaluation 11/04/22    Authorization Type UHC Medicare    Progress Note Due on Visit 10    PT Start Time 1403    PT Stop Time 1445    PT Time Calculation (min) 42 min    Equipment Utilized During Treatment Gait belt    Activity Tolerance Patient tolerated treatment well    Behavior During Therapy WFL for tasks assessed/performed                 Past Medical History:  Diagnosis Date   CKD (chronic kidney disease) stage 3, GFR 30-59 ml/min (HCC)    Gait abnormality    Hypertension    Pancreatitis    Seizures ([place])    Venous insufficiency    Past Surgical History:  Procedure Laterality Date   HERNIA REPAIR     TRANSCAROTID ARTERY REVASCULARIZATION  Left 08/12/2022   Procedure: Transcarotid Artery Revascularization;  Surgeon: Waynetta Sandy, MD;  Location: [facility];  Service: Vascular;  Laterality: Left;   Patient Active Problem List   Diagnosis Date Noted   CVA (cerebrovascular accident) ([place]) 08/14/2022   Acute-on-chronic kidney injury ([place]) 08/05/2022   CVA (cerebral vascular accident) ([place]) 08/05/2022   Dysphagia 08/05/2022   Abnormal TSH 123456   Acute metabolic encephalopathy 123456   CKD (chronic kidney disease), stage III ([place]) 04/26/2021   Weakness generalized 04/26/2021   Weakness 04/25/2021   Syncope 01/13/2012   Anemia 12/09/2011   Seizures ([place]) 12/08/2011   HTN (hypertension) 12/08/2011   Encephalopathy 12/08/2011    ONSET DATE: 08/25/2022  REFERRING DIAG: I63.09 (ICD-10-CM) - Cerebrovascular accident (CVA) due to thrombosis of other precerebral artery ([place])  THERAPY DIAG:  Unsteadiness on  feet  Other abnormalities of gait and mobility  Muscle weakness (generalized)  Other lack of coordination  Rationale for Evaluation and Treatment: Rehabilitation  SUBJECTIVE:                                                                                                                                                                                             SUBJECTIVE STATEMENT: Pt reports doing well, denies acute changes. Daughter reports pt not using the RW correctly at home despite pt knowing how to. Denies pain today  Pt accompanied by: self and daughter, Karie Kirks    PERTINENT HISTORY: He underwent MRI of the brain 08/05/2022 which revealed multiple small acute infarcts involving the left basal ganglia, left frontal, parietal and occipital cortex and adjacent white matter with slight edema but no mass effect. He was admitted and underwent MRI of the head 08/06/2022 which showed intracranial atherosclerotic disease with associated moderate stenosis at the left carotid siphon with additional severe distal L P3 stenosis. Vascular surgery consulted he underwent CTA of the neck 08/08/2022 which showed proximal left ICA stenosis 75% and 65% on the right and subsequently underwent left transcarotid artery revascularization 08/12/2022 per Dr. Donzetta Matters. PMH: Seizure maintained on Keppra, hypertension, CKD stage III, pancreatitis, venous insufficiency and dysphagia who was admitted 08/05/2022 for right-sided weakness, incontinence, seizure-like activity and fall.  PAIN:  Are you having pain? No  VITALS Vitals:   10/07/22 1410  BP: (!) 146/66  Pulse: 65      PRECAUTIONS: Fall and Other: seizure   PATIENT GOALS: per daughter, pt would like to work on walking   TODAY'S TREATMENT:   Ther Act  STG Assessment  Sit <>stand x1 w/min cues in 12.53s. Noted heavy bracing against mat to stabilize despite cues for scooting hips to edge of mat.     [facility] PT Assessment - 10/07/22 1417       Transfers    Five time sit to stand comments  38.03s   w/ BUE support and heavy lean against mat table     Ambulation/Gait   Gait velocity 32.8' over 50.22s = 0.65 ft/s with RW      Timed Up and Go Test   Normal TUG (seconds) 74.75   w/RW and CGA           Ther Ex  SciFit multi-peaks level 3.5 for 8 minutes using BUE/BLEs for neural priming for reciprocal movement, dynamic cardiovascular warmup and increased amplitude of stepping.    Gait pattern: step to pattern, decreased step length- Right, decreased step length- Left, decreased stride length, decreased hip/knee flexion- Right, decreased hip/knee flexion- Left, decreased ankle dorsiflexion- Right, decreased ankle dorsiflexion- Left, shuffling, trunk flexed, narrow BOS, poor foot clearance- Right, and poor foot clearance- Left Distance walked: Various clinic distances  Assistive device utilized: Environmental consultant - 2 wheeled w/red theraband tied around front as cue for increased step length Level of assistance: CGA Comments: Pt requires min cues for increased step length and to maintain safe distance to AD, especially with turns, as pt tends to step around RW and attempt to sit prior to fully reaching seated surface                                                                                             PATIENT EDUCATION: Education details: continue HEP Person educated: Patient and Child(ren) Education method: Explanation, Demonstration, Tactile cues, and Verbal cues Education comprehension: verbal cues required, tactile cues required, and needs further education  HOME EXERCISE PROGRAM: Access Code: [email or web address] URL: [email or web address] Date: 09/13/2022 Prepared by: Mickie Bail Liyat Fa[place]  Exercises - Standing Quarter Turn with Counter Support  - 1 x daily - 7 x weekly -  3 sets - 10 reps - Sit to Stand with Armchair  - 1 x daily - 7 x weekly - 3 sets - 10 reps  GOALS: Goals reviewed with patient? Yes  SHORT TERM GOALS: Target date:  10/07/2022   Pt will perform initial HEP w/mod A from daughter for improved balance, safety and strength Baseline: not established on eval  Goal status: MET  2.  Pt will improve gait velocity to at least 0.9 ft/s with LRAD and CGA for improved gait efficiency   Baseline: 0.7 ft/s with RW and CGA; 0.65 ft/s (3/28)  Goal status: NOT MET  3.  Pt will perform single sit <>stand in under 45s w/mod verbal cues for improved independence and safety at home.  Baseline: 63s w/max verbal cues for proper sequencing; 12.53s w/min cues  Goal status: MET  4.  Pt will improve normal TUG to less than or equal to 80 seconds w/LRAD and mod verbal cues for improved functional mobility and decreased fall risk.  Baseline: 89.34sw/RW and max verbal cues; 74.75s w/RW and CGA (3/28) Goal status: MET  LONG TERM GOALS: Target date: 11/04/2022   Pt will perform final HEP w/min A from daughter for improved strength, balance, transfers and gait.  Baseline:  Goal status: INITIAL  2.  Pt will improve normal TUG to less than or equal to 70 seconds w/LRAD and min verbal cues for improved functional mobility and decreased fall risk.  Baseline: 89.34sw/RW and max verbal cues; 74.75s w/RW and CGA (3/28) Goal status: REVISED  3.  Pt will improve 5 x STS to less than or equal to 35 seconds w/proper form to demonstrate improved functional strength and transfer efficiency.   Baseline: 38.03s w/heavy bracing against mat table  Goal status: REVISED  4.  Pt will improve gait velocity to at least 0.9 ft/s with LRAD and S* for improved gait efficiency and safety  Baseline: 0.7 ft/s with RW; 0.72ft/s (3/28) Goal status: REVISED    ASSESSMENT:  CLINICAL IMPRESSION: Emphasis of skilled PT session on STG assessment, safe AD management and endurance. Pt has met 3 of 4 STGs, performing his HEP regularly and improving his TUG time by 15s w/reduced cues and physical assistance. Pt also able to complete 5x STS today in  <40s, whereas a single sit <>stand took pt >60s to complete w/max cues on eval. Pt has not met his gait speed goal and was slightly slower this date. Pt continues to have difficulty managing RW and requires mod-max cues for safe AD management. Continue POC.    OBJECTIVE IMPAIRMENTS: Abnormal gait, decreased activity tolerance, decreased balance, decreased cognition, decreased coordination, decreased endurance, decreased knowledge of condition, decreased knowledge of use of DME, decreased mobility, difficulty walking, decreased strength, decreased safety awareness, impaired perceived functional ability, and improper body mechanics  ACTIVITY LIMITATIONS: carrying, lifting, sitting, standing, squatting, stairs, transfers, bathing, toileting, dressing, hygiene/grooming, and locomotion level  PARTICIPATION LIMITATIONS: meal prep, cleaning, laundry, medication management, personal finances, driving, shopping, community activity, and yard work  PERSONAL FACTORS: Age, Fitness, Past/current experiences, and 3+ comorbidities: basal ganglia CVA, HTN, seizures  are also affecting patient's functional outcome.   REHAB POTENTIAL: Fair due to severity of deficits and cognitive impairment  CLINICAL DECISION MAKING: Evolving/moderate complexity  EVALUATION COMPLEXITY: Moderate  PLAN:  PT FREQUENCY: 2x/week  PT DURATION: 8 weeks  PLANNED INTERVENTIONS: Therapeutic exercises, Therapeutic activity, Neuromuscular re-education, Balance training, Gait training, Patient/Family education, Self Care, Joint mobilization, Stair training, Vestibular training, Canalith repositioning, DME instructions, Aquatic Therapy,  Manual therapy, and Re-evaluation  PLAN FOR NEXT SESSION:  Check BP. add to HEP- lateral and retro stepping. Work on sit <>stands, increased step length, endurance, LE coordination, anterior weight shifting. Alt stepping over beam -fwd and lat    Sharone Picchi E Leilynn Pilat, PT, DPT 10/07/2022, 2:48 PM

## 2022-10-07 NOTE — Therapy (Signed)
OUTPATIENT OCCUPATIONAL THERAPY NEURO TREATMENT NOTE  Patient Name: Carlos Shields MRN: [id number] DOB:April 08, 1931, 89 y.o., male Today's Date: 10/07/2022  PCP: Raelyn Number, [place] REFERRING PROVIDER: Cathlyn Parsons, PA-C   END OF SESSION:  OT End of Session - 10/07/22 1649     Visit Number 3    Number of Visits 12    Date for OT Re-Evaluation 11/05/22    Authorization Type UHC    OT Start Time [id number]    OT Stop Time [id number]    OT Time Calculation (min) 45 min    Activity Tolerance Patient tolerated treatment well    Behavior During Therapy WFL for tasks assessed/performed             Past Medical History:  Diagnosis Date   CKD (chronic kidney disease) stage 3, GFR 30-59 ml/min (HCC)    Gait abnormality    Hypertension    Pancreatitis    Seizures ([place])    Venous insufficiency    Past Surgical History:  Procedure Laterality Date   HERNIA REPAIR     TRANSCAROTID ARTERY REVASCULARIZATION  Left 08/12/2022   Procedure: Transcarotid Artery Revascularization;  Surgeon: Waynetta Sandy, MD;  Location: [facility];  Service: Vascular;  Laterality: Left;   Patient Active Problem List   Diagnosis Date Noted   CVA (cerebrovascular accident) ([place]) 08/14/2022   Acute-on-chronic kidney injury ([place]) 08/05/2022   CVA (cerebral vascular accident) ([place]) 08/05/2022   Dysphagia 08/05/2022   Abnormal TSH 123456   Acute metabolic encephalopathy 123456   CKD (chronic kidney disease), stage III ([place]) 04/26/2021   Weakness generalized 04/26/2021   Weakness 04/25/2021   Syncope 01/13/2012   Anemia 12/09/2011   Seizures ([place]) 12/08/2011   HTN (hypertension) 12/08/2011   Encephalopathy 12/08/2011    ONSET DATE: acute on chronic (discovered mini-strokes on 08/05/22)   REFERRING DIAG: I63.09 (ICD-10-CM) - Cerebrovascular accident (CVA) due to thrombosis of other precerebral artery ([place])   THERAPY DIAG:  Unsteadiness on feet  Muscle weakness (generalized)  Other lack of  coordination  Rationale for Evaluation and Treatment: Rehabilitation  PERTINENT HISTORY: Per hospital D/C note 08/25/22: "89 y.o. right-handed male with history of seizure maintained on Keppra, hypertension, CKD stage III, pancreatitis, venous insufficiency and dysphagia who was admitted 08/05/2022 for right-sided weakness, incontinence, seizure-like activity and fall...MRI of the brain 08/05/2022 which revealed multiple small acute infarcts involving the left basal ganglia, left frontal, parietal and occipital cortex and adjacent white matter with slight edema but no mass effect...underwent left transcarotid artery revascularization 08/12/2022 per Dr. Donzetta Matters." He was admitted to inpatient rehab and recently discharged on 08/27/22, and the hospital OT left this note about function at discharge: "Patient to discharge at overall Supervision/CGA level.  Patient's care partner is independent to provide the necessary physical and cognitive assistance at discharge.Marland KitchenMarland KitchenPatient still requires CGA for dynamic balance which affects LB dressing and toileting tasks.Marland KitchenMarland KitchenPatient will benefit from ongoing skilled OT services in outpatient setting to continue to advance functional skills in the area of BADL, Reduce care partner burden, and functional use of R UE."  Per OT eval: "He is accompanied by his daughter. He was having some falls, doing worse over time, until daughter took him to the hospital on 08/05/22.  It was determined there that he had had some subacute infarcts in the left lobe of the brain and basal ganglia.  She states he's had a bit worse function now than previously, and she moved him into her home as she was concerned about  his safety living alone.  She does the IADLs and assists with some BADL's."   PRECAUTIONS: Fall; WEIGHT BEARING RESTRICTIONS: No   SUBJECTIVE:   SUBJECTIVE STATEMENT: He & his daughter states they are doing well, nothing new. This is visit 2 for OT.   Pt accompanied by:   daughter    PAIN:  Are you having pain?  Not now at rest Rating: 0/10 at rest now, up to 0/10 at worst in past week   FALLS: Has patient fallen in last 6 months? Yes. Number of falls "about 5 times"   LIVING ENVIRONMENT: Lives with: lives with their daughter Lives in: House/apartment Stairs: No Has following equipment at home: Environmental consultant - 2 wheeled, Electronics engineer, and adapted toilet  PLOF: Independent with basic ADLs, Independent with household mobility with device, and Independent with community mobility with device  PATIENT GOALS: to improve strength, motion, coordination in Rt side and with overall function, if possible    OBJECTIVE: (All objective assessments below are from initial evaluation on: 09/15/22 unless otherwise specified.)   HAND DOMINANCE: Right   ADLs: Overall ADLs: Daughter states she gives Mod A for dressing, has adapted toilet with rails and toilets himself, he sponge bathes with supervision, uses a walker at home and when out for mobility. He used to use the microwave, but hasn't after hospitalization. He now wets the bed more, which did not used to be so problematic.   FUNCTIONAL OUTCOME MEASURES: Eval: Quck DASH 25% impairment today  (Higher % Score  =  More Impairment)   UPPER EXTREMITY ROM     Shoulder to Wrist AROM Right eval Left eval  Shoulder flexion 90 130  Shoulder abduction 90 130  Elbow flexion 130 155  Elbow extension 0 0  Forearm supination WFL WFL  Forearm pronation  [facility]  Wrist flexion Tight but functional Tight but functional  Wrist extension Tight but functional Tight but functional  (Blank rows = not tested)   Hand AROM Right eval Left eval  Full Fist Ability (or Gap to Distal Palmar Crease) full full  Thumb Opposition  (Kapandji Scale)  6 6  (Blank rows = not tested)   UPPER EXTREMITY MMT:     MMT Right 09/15/22 Left 09/15/22  Shoulder flexion 4/5 5/5  Elbow flexion 4/5 4+/5  Elbow extension 4/5 4+/5  (Blank rows = not  tested)  HAND FUNCTION: Eval: Observed weakness in affected hand.  Grip strength Right: 48 lbs, Left: 57 lbs   COORDINATION: Eval: Observed coordination impairments with affected hand. Box and Blocks Test: Rt: 20 blocks, Lt 21 blocks today   COGNITION: Eval: Overall cognitive status: He was quiet and seemingly dependent on his daughter to answer most questions for him. He seemed to have at least mild recall/memory issues as well, and small problems following simple commands at times.   OBSERVATIONS:   Eval: Overall he presents as somewhat more stiff, slower coordination, and weaker in dominant right upper extremity today.   TODAY'S TREATMENT:  10/07/22:  Neuromuscular reeducation to RUE, also to address transitional movements; sit to stand, stand to sit and walker safety.  Patient needs facilitation and cueing for sufficient weight shift forward for sit to stand transitions.   Worked also on more balanced muscle control around shoulder and elbow joint.  Working for more balanced agonist/antagonist activity.    Exercises - Seated Shoulder Flexion AAROM with Dowel  - 4-6 x daily - 1 sets - 10-15 reps - Seated Shoulder Abduction AAROM  with Dowel  - 4-6 x daily - 1 sets - 10-15 reps - Seated Single Arm Shoulder Extension  - 4-6 x daily - 1 sets - 10-15 reps - Seated Lifting Hands Behind Back  - 4-6 x daily - 1 sets - 10-15 reps - Seated Shoulder Row with Anchored Resistance  - 1-2 x daily - 1-2 sets - 10-15 reps - Seated Chair Push Ups  - 2-3 x daily - 1-2 sets - 10-15 reps   PATIENT EDUCATION: Education details: See tx section above for details  Person educated: Patient Education method: Verbal Instruction, Teach back, Handouts  Education comprehension: States and demonstrates understanding, Additional Education required    HOME EXERCISE PROGRAM: Access Code: [email or web address] URL: [email or web address]  GOALS: Goals reviewed with patient? Yes   SHORT TERM GOALS: (STG  required if POC>30 days) Target Date: 10/01/22  Pt will demo/state understanding of initial HEP to improve pain levels and prerequisite motion. Goal status: 09/29/22: MET   LONG TERM GOALS: Target Date: 11/05/22  Pt will improve functional ability by decreased impairment per Quick DASH assessment from 25% to 15% or better, for better quality of life. Goal status: INITIAL  2.  Pt will improve grip strength in Rt hand hand from 48lbs to at least 55lbs for functional use at home and in IADLs. Goal status: INITIAL  3.  Pt will improve A/ROM in Rt sh flex and abd from 90* each to at least 120* each, to have functional motion for tasks like reach and grasp.  Goal status: INITIAL  4.  Pt will improve strength in Rt elbow from 4/5 MMT to at least 4+/5 MMT to have increased functional ability to carry out selfcare and higher-level tasks with less difficulty. Goal status: INITIAL  5.  Pt will improve coordination skills in b/l UEs, as seen by better score (at least 30 blocks in both UEs) on BBT testing to have increased functional ability to carry out fine motor tasks (fasteners, etc.) and more complex, coordinated IADLs as able.  Goal status: INITIAL   ASSESSMENT:  CLINICAL IMPRESSION: 10/07/22: Doing well, tolerating treatment, no pain, trying to improve mobility, strength, function, etc.   Eval: Patient is a 89 y.o. male who was seen today for occupational therapy evaluation for gross general upper body weakness and stiffness more so on the right arm than the left arm after recent hospitalization and CVA infarcts.  He has also had a recent decrease in functional mobility which puts increased strain on his daughter who is his caregiver.  He will benefit from outpatient occupational therapy to increase quality of life.   PERFORMANCE DEFICITS: in functional skills including ADLs, IADLs, coordination, dexterity, ROM, strength, flexibility, Fine motor control, mobility, balance, body mechanics,  endurance, decreased knowledge of precautions, and UE functional use, cognitive skills including attention, energy/drive, memory, problem solving, safety awareness, and thought, and psychosocial skills including coping strategies, environmental adaptation, habits, and routines and behaviors.    PLAN:  OT FREQUENCY: 2x/week  OT DURATION: 8 weeks  PLANNED INTERVENTIONS: self care/ADL training, therapeutic exercise, therapeutic activity, neuromuscular re-education, manual therapy, passive range of motion, functional mobility training, compression bandaging, moist heat, cryotherapy, contrast bath, patient/family education, cognitive remediation/compensation, visual/perceptual remediation/compensation, energy conservation, coping strategies training, and DME and/or AE instructions  CONSULTED AND AGREED WITH PLAN OF CARE: Patient and family member/caregiver  PLAN FOR NEXT SESSION:  Review HEP, continue working on mobility, strength, function, etc.    Antony Salmon, OTR/L 10/07/22 4:50 PM Phone: [phone] Fax: [phone]   

## 2022-10-13 ENCOUNTER — Ambulatory Visit: Payer: Medicare Other | Admitting: Rehabilitative and Restorative Service Providers"

## 2022-10-13 ENCOUNTER — Ambulatory Visit: Payer: Medicare Other | Attending: Physician Assistant | Admitting: Physical Therapy

## 2022-10-13 ENCOUNTER — Encounter: Payer: Medicare Other | Attending: Registered Nurse | Admitting: Physical Medicine and Rehabilitation

## 2022-10-13 VITALS — BP 144/76 | HR 61

## 2022-10-13 VITALS — BP 128/74 | HR 82 | Ht 64.0 in | Wt 120.0 lb

## 2022-10-13 DIAGNOSIS — F01A0 Vascular dementia, mild, without behavioral disturbance, psychotic disturbance, mood disturbance, and anxiety: Secondary | ICD-10-CM | POA: Diagnosis not present

## 2022-10-13 DIAGNOSIS — R569 Unspecified convulsions: Secondary | ICD-10-CM | POA: Diagnosis not present

## 2022-10-13 DIAGNOSIS — I6309 Cerebral infarction due to thrombosis of other precerebral artery: Secondary | ICD-10-CM | POA: Insufficient documentation

## 2022-10-13 DIAGNOSIS — M6281 Muscle weakness (generalized): Secondary | ICD-10-CM | POA: Diagnosis not present

## 2022-10-13 DIAGNOSIS — R2681 Unsteadiness on feet: Secondary | ICD-10-CM | POA: Diagnosis not present

## 2022-10-13 DIAGNOSIS — R278 Other lack of coordination: Secondary | ICD-10-CM | POA: Diagnosis not present

## 2022-10-13 DIAGNOSIS — I63239 Cerebral infarction due to unspecified occlusion or stenosis of unspecified carotid arteries: Secondary | ICD-10-CM | POA: Diagnosis not present

## 2022-10-13 DIAGNOSIS — R293 Abnormal posture: Secondary | ICD-10-CM | POA: Insufficient documentation

## 2022-10-13 DIAGNOSIS — R2689 Other abnormalities of gait and mobility: Secondary | ICD-10-CM | POA: Diagnosis not present

## 2022-10-13 NOTE — Patient Instructions (Addendum)
Instructions from Neurology Dr. Alric Ran at [facility] 3.21.24: Patient Instructions  Discontinue Aspirin  Continue with Plavix 75 mg daily  Increase Namenda to 10 mg twice daily  Continue your other medications Return in a year or sooner if worse   You can take miralax over the counter up to 3 times daily and use the food handout provided to improved stools, goal is 1 bowel movement every 1-2 days,   Follow up with our office as needed.

## 2022-10-13 NOTE — Therapy (Signed)
OUTPATIENT PHYSICAL THERAPY NEURO TREATMENT    Patient Name: Carlos Shields MRN: [id number] DOB:November 06, 1930, 89 y.o., male Today's Date: 10/13/2022   PCP: [name], [place] REFERRING PROVIDER: Cathlyn Parsons, PA-C  END OF SESSION:  PT End of Session - 10/13/22 1407     Visit Number 9    Number of Visits 17    Date for PT Re-Evaluation 11/04/22    Authorization Type UHC Medicare    Progress Note Due on Visit 10    PT Start Time 1403    PT Stop Time 1448    PT Time Calculation (min) 45 min    Equipment Utilized During Treatment Gait belt    Activity Tolerance Patient tolerated treatment well    Behavior During Therapy WFL for tasks assessed/performed                 Past Medical History:  Diagnosis Date   CKD (chronic kidney disease) stage 3, GFR 30-59 ml/min (HCC)    Gait abnormality    Hypertension    Pancreatitis    Seizures ([place])    Venous insufficiency    Past Surgical History:  Procedure Laterality Date   HERNIA REPAIR     TRANSCAROTID ARTERY REVASCULARIZATION  Left 08/12/2022   Procedure: Transcarotid Artery Revascularization;  Surgeon: Waynetta Sandy, MD;  Location: [facility];  Service: Vascular;  Laterality: Left;   Patient Active Problem List   Diagnosis Date Noted   CVA (cerebrovascular accident) 08/14/2022   Acute-on-chronic kidney injury 08/05/2022   CVA (cerebral vascular accident) 08/05/2022   Dysphagia 08/05/2022   Abnormal TSH 123456   Acute metabolic encephalopathy 123456   CKD (chronic kidney disease), stage III 04/26/2021   Weakness generalized 04/26/2021   Weakness 04/25/2021   Syncope 01/13/2012   Anemia 12/09/2011   Seizures 12/08/2011   HTN (hypertension) 12/08/2011   Encephalopathy 12/08/2011    ONSET DATE: 08/25/2022  REFERRING DIAG: I63.09 (ICD-10-CM) - Cerebrovascular accident (CVA) due to thrombosis of other precerebral artery ([place])  THERAPY DIAG:  Unsteadiness on feet  Other lack of  coordination  Other abnormalities of gait and mobility  Rationale for Evaluation and Treatment: Rehabilitation  SUBJECTIVE:                                                                                                                                                                                             SUBJECTIVE STATEMENT: Pt reports doing well, denies acute changes. Daughter reports pt not using the RW correctly at home despite pt knowing how to. Denies pain today   Pt accompanied by: self and daughter, Carlos Shields  PERTINENT HISTORY: He underwent MRI of the brain 08/05/2022 which revealed multiple small acute infarcts involving the left basal ganglia, left frontal, parietal and occipital cortex and adjacent white matter with slight edema but no mass effect. He was admitted and underwent MRI of the head 08/06/2022 which showed intracranial atherosclerotic disease with associated moderate stenosis at the left carotid siphon with additional severe distal L P3 stenosis. Vascular surgery consulted he underwent CTA of the neck 08/08/2022 which showed proximal left ICA stenosis 75% and 65% on the right and subsequently underwent left transcarotid artery revascularization 08/12/2022 per Dr. Donzetta Matters. PMH: Seizure maintained on Keppra, hypertension, CKD stage III, pancreatitis, venous insufficiency and dysphagia who was admitted 08/05/2022 for right-sided weakness, incontinence, seizure-like activity and fall.  PAIN:  Are you having pain? No  VITALS Vitals:   10/13/22 1408  BP: (!) 144/76  Pulse: 61      PRECAUTIONS: Fall and Other: seizure   PATIENT GOALS: per daughter, pt would like to work on walking   TODAY'S TREATMENT:   NMR  5 Blaze pods on mirror on random reach for 2 min rounds for improved visual scanning, anterior weight shift, lateral reaching and UE coordination:  Round 1: 4 hits. Pt unable to poke pod hard enough to turn light off  Round 2: 26 hits w/boomwhacker in RUE  Round  3: 34 hits w/boomwhacker in RUE  5 blaze pods on mirror on distraction colors setting (4 distracting pods, one target pod) while holding boomwhacker in each hand for improved lateral reaching, anterior weight shifting and UE coordination:  Round 1: 8 hits (min cues to avoid hitting distracting color)  Round 2: 18 hits Round 3: 18 hits   Ther Ex  SciFit multi-peaks level 5 for 6 minutes using BUE/BLEs for neural priming for reciprocal movement, dynamic cardiovascular warmup and increased amplitude of stepping. Min cues to fully turn prior to sitting on seat and for increased anterior lean when standing from seat.     Gait pattern: step to pattern, decreased step length- Right, decreased step length- Left, decreased stride length, decreased hip/knee flexion- Right, decreased hip/knee flexion- Left, decreased ankle dorsiflexion- Right, decreased ankle dorsiflexion- Left, shuffling, trunk flexed, narrow BOS, poor foot clearance- Right, and poor foot clearance- Left Distance walked: Various clinic distances  Assistive device utilized: Environmental consultant - 2 wheeled w/red theraband tied around front as cue for increased step length Level of assistance: CGA Comments: Pt requires min cues for increased step length and to maintain safe distance to AD, especially with turns, as pt tends to step around RW and attempt to sit prior to fully reaching seated surface                                                                                             PATIENT EDUCATION: Education details: continue HEP Person educated: Patient and Child(ren) Education method: Explanation, Demonstration, Tactile cues, and Verbal cues Education comprehension: verbal cues required, tactile cues required, and needs further education  HOME EXERCISE PROGRAM: Access Code: [email or web address] URL: [email or web address] Date: 09/13/2022 Prepared by: Mickie Bail Caylynn Minchew  Exercises - Standing Quarter Turn with Counter Support  -  1 x  daily - 7 x weekly - 3 sets - 10 reps - Sit to Stand with Armchair  - 1 x daily - 7 x weekly - 3 sets - 10 reps  GOALS: Goals reviewed with patient? Yes  SHORT TERM GOALS: Target date: 10/07/2022   Pt will perform initial HEP w/mod A from daughter for improved balance, safety and strength Baseline: not established on eval  Goal status: MET  2.  Pt will improve gait velocity to at least 0.9 ft/s with LRAD and CGA for improved gait efficiency   Baseline: 0.7 ft/s with RW and CGA; 0.65 ft/s (3/28)  Goal status: NOT MET  3.  Pt will perform single sit <>stand in under 45s w/mod verbal cues for improved independence and safety at home.  Baseline: 63s w/max verbal cues for proper sequencing; 12.53s w/min cues  Goal status: MET  4.  Pt will improve normal TUG to less than or equal to 80 seconds w/LRAD and mod verbal cues for improved functional mobility and decreased fall risk.  Baseline: 89.34sw/RW and max verbal cues; 74.75s w/RW and CGA (3/28) Goal status: MET  LONG TERM GOALS: Target date: 11/04/2022   Pt will perform final HEP w/min A from daughter for improved strength, balance, transfers and gait.  Baseline:  Goal status: INITIAL  2.  Pt will improve normal TUG to less than or equal to 70 seconds w/LRAD and min verbal cues for improved functional mobility and decreased fall risk.  Baseline: 89.34sw/RW and max verbal cues; 74.75s w/RW and CGA (3/28) Goal status: REVISED  3.  Pt will improve 5 x STS to less than or equal to 35 seconds w/proper form to demonstrate improved functional strength and transfer efficiency.   Baseline: 38.03s w/heavy bracing against mat table  Goal status: REVISED  4.  Pt will improve gait velocity to at least 0.9 ft/s with LRAD and S* for improved gait efficiency and safety  Baseline: 0.7 ft/s with RW; 0.62ft/s (3/28) Goal status: REVISED    ASSESSMENT:  CLINICAL IMPRESSION: Emphasis of skilled PT session on UE coordination and anterior  weight shifting to promote better transfers. Pt did well w/blaze pods and used a good mix of L and RUE throughout. Pt able to shift weight forward well w/activity, but has difficulty applying this to sit <>stand transfer. Continue POC.    OBJECTIVE IMPAIRMENTS: Abnormal gait, decreased activity tolerance, decreased balance, decreased cognition, decreased coordination, decreased endurance, decreased knowledge of condition, decreased knowledge of use of DME, decreased mobility, difficulty walking, decreased strength, decreased safety awareness, impaired perceived functional ability, and improper body mechanics  ACTIVITY LIMITATIONS: carrying, lifting, sitting, standing, squatting, stairs, transfers, bathing, toileting, dressing, hygiene/grooming, and locomotion level  PARTICIPATION LIMITATIONS: meal prep, cleaning, laundry, medication management, personal finances, driving, shopping, community activity, and yard work  PERSONAL FACTORS: Age, Fitness, Past/current experiences, and 3+ comorbidities: basal ganglia CVA, HTN, seizures  are also affecting patient's functional outcome.   REHAB POTENTIAL: Fair due to severity of deficits and cognitive impairment  CLINICAL DECISION MAKING: Evolving/moderate complexity  EVALUATION COMPLEXITY: Moderate  PLAN:  PT FREQUENCY: 2x/week  PT DURATION: 8 weeks  PLANNED INTERVENTIONS: Therapeutic exercises, Therapeutic activity, Neuromuscular re-education, Balance training, Gait training, Patient/Family education, Self Care, Joint mobilization, Stair training, Vestibular training, Canalith repositioning, DME instructions, Aquatic Therapy, Manual therapy, and Re-evaluation  PLAN FOR NEXT SESSION:  Check BP. add to HEP- lateral and retro stepping. Work on sit <>stands, increased step length, endurance, LE coordination, anterior weight shifting. Alt  stepping over beam -fwd and lat    Caleigha Zale E Chanz Cahall, PT, DPT 10/13/2022, 2:49 PM

## 2022-10-13 NOTE — Progress Notes (Signed)
Subjective:    Patient ID: Carlos Shields, male    DOB: 04/19/1931, 89 y.o.   MRN: [id number]  HPI  Brief HPI:   Carlos ClevelandRoy Joa is a 89 y.o. right-handed male with history of seizure maintained on Keppra, hypertension, CKD stage III, pancreatitis, venous insufficiency and dysphagia who was admitted 08/05/2022 for right-sided weakness, incontinence, seizure-like activity and fall.  He had been on his Keppra and aspirin.  In the ED his workup was notable for creatinine 2.1 hemoglobin 9.5 TSH 4.922, hemoglobin A1c 5.3.  He underwent MRI of the brain 08/05/2022 which revealed multiple small acute infarcts involving the left basal ganglia, left frontal, parietal and occipital cortex and adjacent white matter with slight edema but no mass effect.  He was admitted and underwent MRI of the head 08/06/2022 which showed intracranial atherosclerotic disease with associated moderate stenosis at the left carotid siphon with additional severe distal L P3 stenosis.  Vascular surgery consulted he underwent CTA of the neck 08/08/2022 which showed proximal left ICA stenosis 75% and 65% on the right and subsequently underwent left transcarotid artery revascularization 08/12/2022 per Dr. Randie Heinzain.  Lipid panel done 08/06/2022 showing LDL 69 cholesterol 143 started on Crestor during hospitalization.  Echocardiogram preserved EF ejection fraction 60 to 65% but LV diastolic parameters grade 1 diastolic dysfunction.  He resumed his aspirin as prior to admission and started on Plavix daily 08/08/2018 for which she would continue for 3 weeks then Plavix alone.  Therapy evaluations completed due to patient decreased functional mobility/right-sided weakness was admitted for a comprehensive rehab program.     Hospital Course: Carlos ClevelandRoy Pindell was admitted to rehab 08/14/2022 for inpatient therapies to consist of PT, ST and OT at least three hours five days a week. Past admission physiatrist, therapy team and rehab RN have worked together to provide customized  collaborative inpatient rehab.  Pertaining to patient's acute ischemic CVA infarcts involving left basal ganglia left frontal right occipital cortex and adjacent white matter remained stable he would follow-up with neurology services.  He remained on aspirin and Plavix for CVA prophylaxis through 08/29/2022 then Plavix alone.  Noted history of dysphagia he continued on dysphagia #1 thin liquid diet.  Keppra ongoing for history of seizure no seizure activity.  Lovenox for DVT prophylaxis no bleeding episodes.  Left internal carotid artery stenosis status post TCAR 08/12/2022 followed by Dr. Randie Heinzain.  Pain management use of oxycodone as needed.  Crestor ongoing for hyperlipidemia.  Blood pressure monitored permissive hypertension maintained on low-dose Lopressor and home Norvasc resumed for now and would need outpatient follow-up.  Acute on chronic CKD stage III with latest creatinine 1.67.  Hospital course findings of elevated TSH 4.9 on admission Free T40.88 Within normal limits follow-up PCP.     Blood pressures were monitored on TID basis and controlled         Rehab course: During patient's stay in rehab weekly team conferences were held to monitor patient's progress, set goals and discuss barriers to discharge. At admission, patient required min mod assist 115 feet rolling walker  Interval Hx:  Seen today with his daughter, who lives with the patient and assists with his needs.    - Therapies: Has been in therapies, last appointments today. Has been going well, working on walking and using his upper extremities. His functional level is approximately the same as it was prior to his hospitalization.    - Follow ups: Has been referred to neurology Dr. Ellan Lambertnasanya; has not had follow up with  them. Did lots of imaging in the past. No findings. [facility]    - Falls: Seizure on 3/11; was in the recliner, so no fall. He did not have his medicaitons at that time so he's had them  refilled since. Norva RiffleAshley Vanstory, NP managing his medications now.    - DME: Mostly already had home equipment, includine walker and tub bench.    - Medications: Has not been needing oxycodone, his daughter threw it out because she didn't want it around the house.    - Other concerns: none  Pain Inventory Average Pain 0 Pain Right Now 0 My pain is  no pain  LOCATION OF PAIN  no pain  BOWEL Number of stools per week: 1 Oral laxative use Yes  Type of laxative miralax Enema or suppository use No  History of colostomy No  Incontinent No   BLADDER Normal In and out cath, frequency . Able to self cath  . Bladder incontinence No  Frequent urination No  Leakage with coughing No  Difficulty starting stream No  Incomplete bladder emptying No    Mobility walk with assistance ability to climb steps?  no do you drive?  no  Function retired I need assistance with the following:  dressing, bathing, toileting, meal prep, household duties, and shopping  Neuro/Psych trouble walking  Prior Studies Any changes since last visit?  no  Physicians involved in your care Any changes since last visit?  no   Family History  Problem Relation Age of Onset   Hypertension Other    Diabetes Other    Asthma Other    Social History   Socioeconomic History   Marital status: Divorced    Spouse name: Not on file   Number of children: 2   Years of education: Not on file   Highest education level: Not on file  Occupational History   Occupation: retired  Tobacco Use   Smoking status: Former    Types: Cigars   Smokeless tobacco: Never  Building services engineerVaping Use   Vaping Use: Never used  Substance and Sexual Activity   Alcohol use: No   Drug use: No   Sexual activity: Never  Other Topics Concern   Not on file  Social History Narrative   Not on file   Social Determinants of Health   Financial Resource Strain: Not on file  Food Insecurity: No Food Insecurity (08/06/2022)   Hunger Vital Sign     Worried About Running Out of Food in the Last Year: Never true    Ran Out of Food in the Last Year: Never true  Transportation Needs: No Transportation Needs (08/06/2022)   PRAPARE - Administrator, Civil ServiceTransportation    Lack of Transportation (Medical): No    Lack of Transportation (Non-Medical): No  Physical Activity: Not on file  Stress: Not on file  Social Connections: Not on file   Past Surgical History:  Procedure Laterality Date   HERNIA REPAIR     TRANSCAROTID ARTERY REVASCULARIZATION  Left 08/12/2022   Procedure: Transcarotid Artery Revascularization;  Surgeon: Maeola Harmanain, Brandon Christopher, MD;  [facility];  Service: Vascular;  Laterality: Left;   Past Medical History:  Diagnosis Date   CKD (chronic kidney disease) stage 3, GFR 30-59 ml/min (HCC)    Gait abnormality    Hypertension    Pancreatitis    Seizures (HCC)    Venous insufficiency    BP 128/74   Pulse 82   Ht 5\' 4"  (1.626 m)  Wt 120 lb (54.4 kg)   SpO2 95%   BMI 20.60 kg/m   Opioid Risk Score:   Fall Risk Score:  `1  Depression screen [facility] 2/9     09/08/2022   11:15 AM  Depression screen PHQ 2/9  Decreased Interest 2  Down, Depressed, Hopeless 0  PHQ - 2 Score 2  Altered sleeping 0  Tired, decreased energy 1  Change in appetite 0  Feeling bad or failure about yourself  2  Trouble concentrating 0  Moving slowly or fidgety/restless 2  Suicidal thoughts 0  PHQ-9 Score 7      Review of Systems  Musculoskeletal:  Positive for gait problem.  All other systems reviewed and are negative.      Objective:   Physical Exam   PE: Constitution: Appropriate appearance for age. No apparent distress  Resp: No respiratory distress. No accessory muscle usage. on RA and CTAB Cardio: Well perfused appearance. No peripheral edema. Abdomen: Nondistended. Nontender.   Psych: Appropriate mood and affect. Neuro: AAOx4. No apparent cognitive deficits   Neurologic Exam:   DTRs: Reflexes were 2+ in bilateral achilles,  patella, biceps, BR and triceps. Babinsky: flexor responses b/l.   Hoffmans: negative b/l Sensory exam: revealed normal sensation in all dermatomal regions in bilateral upper extremities and bilateral lower extremities Motor exam: strength 5/5 throughout bilateral upper extremities and bilateral lower extremities Coordination: Mild RUE ataxia Gait: normal          Assessment & Plan:   Lennard Capek is a 89 y.o. year old male  who  has a past medical history of CKD (chronic kidney disease) stage 3, GFR 30-59 ml/min, Gait abnormality, Hypertension, Pancreatitis, Seizures, and Venous insufficiency.   They are presenting to PM&R clinic as TOC s/p IPR admission for L CVA s/p  left transcarotid artery revascularization 08/12/2022.  Cerebrovascular accident (CVA) due to stenosis of carotid artery, unspecified blood vessel laterality Continue with Plavix 75 mg daily   Follow up with Neurology Dr. Windell Norfolk at [facility] as needed  You can take miralax over the counter up to 3 times daily and use the food handout provided to improved stools, goal is 1 bowel movement every 1-2 days,   Follow up with our office as needed.  Seizures Obtained Keppra; advised to continue medication for ppx unless otherwise instructed by neurology  Mild vascular dementia, unspecified whether behavioral, psychotic, or mood disturbance or anxiety Continue Namenda to 10 mg twice daily

## 2022-10-15 ENCOUNTER — Ambulatory Visit: Payer: Medicare Other | Admitting: Occupational Therapy

## 2022-10-15 ENCOUNTER — Encounter: Payer: Self-pay | Admitting: Occupational Therapy

## 2022-10-15 ENCOUNTER — Ambulatory Visit: Payer: Medicare Other

## 2022-10-15 DIAGNOSIS — I6309 Cerebral infarction due to thrombosis of other precerebral artery: Secondary | ICD-10-CM | POA: Diagnosis not present

## 2022-10-15 DIAGNOSIS — R278 Other lack of coordination: Secondary | ICD-10-CM

## 2022-10-15 DIAGNOSIS — R293 Abnormal posture: Secondary | ICD-10-CM | POA: Diagnosis not present

## 2022-10-15 DIAGNOSIS — M6281 Muscle weakness (generalized): Secondary | ICD-10-CM | POA: Diagnosis not present

## 2022-10-15 DIAGNOSIS — R2689 Other abnormalities of gait and mobility: Secondary | ICD-10-CM | POA: Diagnosis not present

## 2022-10-15 DIAGNOSIS — R2681 Unsteadiness on feet: Secondary | ICD-10-CM | POA: Diagnosis not present

## 2022-10-15 NOTE — Therapy (Addendum)
OUTPATIENT OCCUPATIONAL THERAPY NEURO TREATMENT NOTE  Patient Name: Carlos Shields MRN: [id number] DOB:[birth year], 89 y.o., male Today's Date: 10/15/2022  PCP: Norva Riffle, PA REFERRING PROVIDER: Charlton Amor, PA-C   END OF SESSION:  OT End of Session - 10/15/22 1319     Visit Number 4    Number of Visits 12    Date for OT Re-Evaluation 11/05/22    Authorization Type [employer]    OT Start Time 1319    OT Stop Time 1400    OT Time Calculation (min) 41 min    Activity Tolerance Patient tolerated treatment well    Behavior During Therapy WFL for tasks assessed/performed              Past Medical History:  Diagnosis Date   CKD (chronic kidney disease) stage 3, GFR 30-59 ml/min    Gait abnormality    Hypertension    Pancreatitis    Seizures    Venous insufficiency    Past Surgical History:  Procedure Laterality Date   HERNIA REPAIR     TRANSCAROTID ARTERY REVASCULARIZATION  Left 08/12/2022   Procedure: Transcarotid Artery Revascularization;  Surgeon: Maeola Harman, MD;  Location: [facility] OR;  Service: Vascular;  Laterality: Left;   Patient Active Problem List   Diagnosis Date Noted   CVA (cerebrovascular accident) 08/14/2022   Acute-on-chronic kidney injury 08/05/2022   CVA (cerebral vascular accident) 08/05/2022   Dysphagia 08/05/2022   Abnormal TSH 08/05/2022   Acute metabolic encephalopathy 08/05/2022   CKD (chronic kidney disease), stage III 04/26/2021   Weakness generalized 04/26/2021   Weakness 04/25/2021   Syncope 01/13/2012   Anemia 12/09/2011   Seizures 12/08/2011   HTN (hypertension) 12/08/2011   Encephalopathy 12/08/2011    ONSET DATE: acute on chronic (discovered mini-strokes on 08/05/22)   REFERRING DIAG: I63.09 (ICD-10-CM) - Cerebrovascular accident (CVA) due to thrombosis of other precerebral artery (HCC)   THERAPY DIAG:  Other lack of coordination  Muscle weakness (generalized)  Cerebrovascular accident (CVA) due to thrombosis of  other precerebral artery  Rationale for Evaluation and Treatment: Rehabilitation  PERTINENT HISTORY: Per hospital D/C note 08/25/22: "89 y.o. right-handed male with history of seizure maintained on Keppra, hypertension, CKD stage III, pancreatitis, venous insufficiency and dysphagia who was admitted 08/05/2022 for right-sided weakness, incontinence, seizure-like activity and fall...MRI of the brain 08/05/2022 which revealed multiple small acute infarcts involving the left basal ganglia, left frontal, parietal and occipital cortex and adjacent white matter with slight edema but no mass effect...underwent left transcarotid artery revascularization 08/12/2022 per Dr. Randie Heinz." He was admitted to inpatient rehab and recently discharged on 08/27/22, and the hospital OT left this note about function at discharge: "Patient to discharge at overall Supervision/CGA level.  Patient's care partner is independent to provide the necessary physical and cognitive assistance at discharge.Marland KitchenMarland KitchenPatient still requires CGA for dynamic balance which affects LB dressing and toileting tasks.Marland KitchenMarland KitchenPatient will benefit from ongoing skilled OT services in outpatient setting to continue to advance functional skills in the area of BADL, Reduce care partner burden, and functional use of R UE."  Per OT eval: "He is accompanied by his daughter. He was having some falls, doing worse over time, until daughter took him to the hospital on 08/05/22.  It was determined there that he had had some subacute infarcts in the left lobe of the brain and basal ganglia.  She states he's had a bit worse function now than previously, and she moved him into her home as she was concerned  about his safety living alone.  She does the IADLs and assists with some BADL's."   PRECAUTIONS: Fall; WEIGHT BEARING RESTRICTIONS: No   SUBJECTIVE:   SUBJECTIVE STATEMENT: He & his daughter states he doesn't have any hobbies.   Pt accompanied by:  daughter  PAIN:  Are you having  pain?  Not now at rest Rating: 0/10 at rest now, up to 0/10 at worst in past week   FALLS: Has patient fallen in last 6 months? Yes. Number of falls "about 5 times"   LIVING ENVIRONMENT: Lives with: lives with their daughter Lives in: House/apartment Stairs: No Has following equipment at home: Environmental consultantWalker - 2 wheeled, Tour managerhower bench, and adapted toilet  PLOF: Independent with basic ADLs, Independent with household mobility with device, and Independent with community mobility with device  PATIENT GOALS: to improve strength, motion, coordination in Rt side and with overall function, if possible    OBJECTIVE: (All objective assessments below are from initial evaluation on: 09/15/22 unless otherwise specified.)   HAND DOMINANCE: Right   ADLs: Overall ADLs: Daughter states she gives Mod A for dressing, has adapted toilet with rails and toilets himself, he sponge bathes with supervision, uses a walker at home and when out for mobility. He used to use the microwave, but hasn't after hospitalization. He now wets the bed more, which did not used to be so problematic.   FUNCTIONAL OUTCOME MEASURES: Eval: Quck DASH 25% impairment today  (Higher % Score  =  More Impairment)   UPPER EXTREMITY ROM     Shoulder to Wrist AROM Right eval Left eval  Shoulder flexion 90 130  Shoulder abduction 90 130  Elbow flexion 130 155  Elbow extension 0 0  Forearm supination WFL WFL  Forearm pronation  [facility]  Wrist flexion Tight but functional Tight but functional  Wrist extension Tight but functional Tight but functional  (Blank rows = not tested)   Hand AROM Right eval Left eval  Full Fist Ability (or Gap to Distal Palmar Crease) full full  Thumb Opposition  (Kapandji Scale)  6 6  (Blank rows = not tested)   UPPER EXTREMITY MMT:     MMT Right 09/15/22 Left 09/15/22  Shoulder flexion 4/5 5/5  Elbow flexion 4/5 4+/5  Elbow extension 4/5 4+/5  (Blank rows = not tested)  HAND FUNCTION: Eval:  Observed weakness in affected hand.  Grip strength Right: 48 lbs, Left: 57 lbs   COORDINATION: Eval: Observed coordination impairments with affected hand. Box and Blocks Test: Rt: 20 blocks, Lt 21 blocks today   COGNITION: Eval: Overall cognitive status: He was quiet and seemingly dependent on his daughter to answer most questions for him. He seemed to have at least mild recall/memory issues as well, and small problems following simple commands at times.   OBSERVATIONS:   Eval: Overall he presents as somewhat more stiff, slower coordination, and weaker in dominant right upper extremity today.   TODAY'S TREATMENT:  Pt completed RUE coordination activities Patient participated in 2 games of dominoes requiring min verbal cues for proper play, mod verbal cues to only use affected R hand with manipulation of dominoes, and increased time for manipulation of dominoes as desired for fine motor coordination, item recognition, and comprehension/strategy following CVA.   PATIENT EDUCATION: Education details: See tx section above for details  Person educated: Patient Education method: Verbal Instruction, Teach back, Handouts  Education comprehension: States and demonstrates understanding, Additional Education required    HOME EXERCISE PROGRAM: Access Code:  [email or web address] URL: [email or web address]  GOALS: Goals reviewed with patient? Yes   SHORT TERM GOALS: (STG required if POC>30 days) Target Date: 10/01/22  Pt will demo/state understanding of initial HEP to improve pain levels and prerequisite motion. Goal status: 09/29/22: MET   LONG TERM GOALS: Target Date: 11/05/22  Pt will improve functional ability by decreased impairment per Quick DASH assessment from 25% to 15% or better, for better quality of life. Goal status: INITIAL  2.  Pt will improve grip strength in Rt hand hand from 48lbs to at least 55lbs for functional use at home and in IADLs. Goal status: INITIAL  3.  Pt  will improve A/ROM in Rt sh flex and abd from 90* each to at least 120* each, to have functional motion for tasks like reach and grasp.  Goal status: INITIAL  4.  Pt will improve strength in Rt elbow from 4/5 MMT to at least 4+/5 MMT to have increased functional ability to carry out selfcare and higher-level tasks with less difficulty. Goal status: INITIAL  5.  Pt will improve coordination skills in b/l UEs, as seen by better score (at least 30 blocks in both UEs) on BBT testing to have increased functional ability to carry out fine motor tasks (fasteners, etc.) and more complex, coordinated IADLs as able.  Goal status: INITIAL   ASSESSMENT:  CLINICAL IMPRESSION: Pt demonstrates tendency to utilize LUE in place of RUE requiring cues. Possible R - side inattention vs learned non-use.  Eval: Patient is a 89 y.o. male who was seen today for occupational therapy evaluation for gross general upper body weakness and stiffness more so on the right arm than the left arm after recent hospitalization and CVA infarcts.  He has also had a recent decrease in functional mobility which puts increased strain on his daughter who is his caregiver.  He will benefit from outpatient occupational therapy to increase quality of life.   PERFORMANCE DEFICITS: in functional skills including ADLs, IADLs, coordination, dexterity, ROM, strength, flexibility, Fine motor control, mobility, balance, body mechanics, endurance, decreased knowledge of precautions, and UE functional use, cognitive skills including attention, energy/drive, memory, problem solving, safety awareness, and thought, and psychosocial skills including coping strategies, environmental adaptation, habits, and routines and behaviors.    PLAN:  OT FREQUENCY: 2x/week  OT DURATION: 8 weeks  PLANNED INTERVENTIONS: self care/ADL training, therapeutic exercise, therapeutic activity, neuromuscular re-education, manual therapy, passive range of motion,  functional mobility training, compression bandaging, moist heat, cryotherapy, contrast bath, patient/family education, cognitive remediation/compensation, visual/perceptual remediation/compensation, energy conservation, coping strategies training, and DME and/or AE instructions  CONSULTED AND AGREED WITH PLAN OF CARE: Patient and family member/caregiver  PLAN FOR NEXT SESSION:  continue working on mobility, strength, function, etc.    Delana MeyerGeny M Aliese Brannum, OT 10/15/2022, 3:33 PM

## 2022-10-18 DIAGNOSIS — F01A0 Vascular dementia, mild, without behavioral disturbance, psychotic disturbance, mood disturbance, and anxiety: Secondary | ICD-10-CM | POA: Insufficient documentation

## 2022-10-19 ENCOUNTER — Ambulatory Visit: Payer: Medicare Other

## 2022-10-19 ENCOUNTER — Ambulatory Visit: Payer: Medicare Other | Admitting: Occupational Therapy

## 2022-10-19 ENCOUNTER — Encounter: Payer: Self-pay | Admitting: Occupational Therapy

## 2022-10-19 VITALS — BP 138/65

## 2022-10-19 DIAGNOSIS — R2681 Unsteadiness on feet: Secondary | ICD-10-CM

## 2022-10-19 DIAGNOSIS — R293 Abnormal posture: Secondary | ICD-10-CM | POA: Diagnosis not present

## 2022-10-19 DIAGNOSIS — R2689 Other abnormalities of gait and mobility: Secondary | ICD-10-CM

## 2022-10-19 DIAGNOSIS — R278 Other lack of coordination: Secondary | ICD-10-CM

## 2022-10-19 DIAGNOSIS — M6281 Muscle weakness (generalized): Secondary | ICD-10-CM

## 2022-10-19 DIAGNOSIS — I6309 Cerebral infarction due to thrombosis of other precerebral artery: Secondary | ICD-10-CM

## 2022-10-19 NOTE — Therapy (Signed)
OUTPATIENT PHYSICAL THERAPY NEURO TREATMENT    Patient Name: Carlos Shields MRN: [id number] DOB:Apr 11, 1931, 89 y.o., male Today's Date: 10/19/2022   PCP: Norm Salt, [place] REFERRING PROVIDER: Charlton Amor, PA-C  Physical Therapy Progress Note   Dates of Reporting Period:09/09/22- 10/19/22  See Note below for Objective Data and Assessment of Progress/Goals.  Thank you for the referral of this patient. Westley Foots, PT, DPT, CBIS   END OF SESSION:  PT End of Session - 10/19/22 1408     Visit Number 10    Number of Visits 17    Date for PT Re-Evaluation 11/04/22    Authorization Type [employer] Medicare    Progress Note Due on Visit 10    PT Start Time 1403    PT Stop Time 1443    PT Time Calculation (min) 40 min    Equipment Utilized During Treatment Gait belt    Activity Tolerance Patient tolerated treatment well    Behavior During Therapy WFL for tasks assessed/performed                 Past Medical History:  Diagnosis Date   CKD (chronic kidney disease) stage 3, GFR 30-59 ml/min    Gait abnormality    Hypertension    Pancreatitis    Seizures    Venous insufficiency    Past Surgical History:  Procedure Laterality Date   HERNIA REPAIR     TRANSCAROTID ARTERY REVASCULARIZATION  Left 08/12/2022   Procedure: Transcarotid Artery Revascularization;  Surgeon: Maeola Harman, MD;  Location: [facility] OR;  Service: Vascular;  Laterality: Left;   Patient Active Problem List   Diagnosis Date Noted   Mild vascular dementia 10/18/2022   CVA (cerebrovascular accident) 08/14/2022   Acute-on-chronic kidney injury 08/05/2022   CVA (cerebral vascular accident) 08/05/2022   Dysphagia 08/05/2022   Abnormal TSH 08/05/2022   Acute metabolic encephalopathy 08/05/2022   CKD (chronic kidney disease), stage III 04/26/2021   Weakness generalized 04/26/2021   Weakness 04/25/2021   Syncope 01/13/2012   Anemia 12/09/2011   Seizures 12/08/2011   HTN (hypertension)  12/08/2011   Encephalopathy 12/08/2011    ONSET DATE: 08/25/2022  REFERRING DIAG: I63.09 (ICD-10-CM) - Cerebrovascular accident (CVA) due to thrombosis of other precerebral artery (HCC)  THERAPY DIAG:  Other lack of coordination  Muscle weakness (generalized)  Unsteadiness on feet  Other abnormalities of gait and mobility  Rationale for Evaluation and Treatment: Rehabilitation  SUBJECTIVE:  SUBJECTIVE STATEMENT: Patient reports doing well. Continues to require consistent cues to remain within walker frame and increase stride length. Denies falls/near falls.   Pt accompanied by: self and daughter, Carlos Shields    PERTINENT HISTORY: He underwent MRI of the brain 08/05/2022 which revealed multiple small acute infarcts involving the left basal ganglia, left frontal, parietal and occipital cortex and adjacent white matter with slight edema but no mass effect. He was admitted and underwent MRI of the head 08/06/2022 which showed intracranial atherosclerotic disease with associated moderate stenosis at the left carotid siphon with additional severe distal L P3 stenosis. Vascular surgery consulted he underwent CTA of the neck 08/08/2022 which showed proximal left ICA stenosis 75% and 65% on the right and subsequently underwent left transcarotid artery revascularization 08/12/2022 per Dr. Randie Heinzain. PMH: Seizure maintained on Keppra, hypertension, CKD stage III, pancreatitis, venous insufficiency and dysphagia who was admitted 08/05/2022 for right-sided weakness, incontinence, seizure-like activity and fall.  PAIN:  Are you having pain? No  VITALS Vitals:   10/19/22 1411  BP: 138/65       PRECAUTIONS: Fall and Other: seizure   PATIENT GOALS: per daughter, pt would like to work on walking   TODAY'S TREATMENT:    NMR  Scifit level 2 x 10 mins B UE/LE for neural priming  Blocked practice sit <> stand on rockerboard to encourage full and complete anterior weight shift and minimizing retropulsion                                                  PATIENT EDUCATION: Education details: continue HEP Person educated: Patient and Child(ren) Education method: Explanation, Demonstration, Tactile cues, and Verbal cues Education comprehension: verbal cues required, tactile cues required, and needs further education  HOME EXERCISE PROGRAM: Access Code: [email or web address] URL: [email or web address] Date: 09/13/2022 Prepared by: Alethia BertholdJannah Plaster  Exercises - Standing Quarter Turn with Counter Support  - 1 x daily - 7 x weekly - 3 sets - 10 reps - Sit to Stand with Armchair  - 1 x daily - 7 x weekly - 3 sets - 10 reps  GOALS: Goals reviewed with patient? Yes  SHORT TERM GOALS: Target date: 10/07/2022   Pt will perform initial HEP w/mod A from daughter for improved balance, safety and strength Baseline: not established on eval  Goal status: MET  2.  Pt will improve gait velocity to at least 0.9 ft/s with LRAD and CGA for improved gait efficiency   Baseline: 0.7 ft/s with RW and CGA; 0.65 ft/s (3/28)  Goal status: NOT MET  3.  Pt will perform single sit <>stand in under 45s w/mod verbal cues for improved independence and safety at home.  Baseline: 63s w/max verbal cues for proper sequencing; 12.53s w/min cues  Goal status: MET  4.  Pt will improve normal TUG to less than or equal to 80 seconds w/LRAD and mod verbal cues for improved functional mobility and decreased fall risk.  Baseline: 89.34sw/RW and max verbal cues; 74.75s w/RW and CGA (3/28) Goal status: MET  LONG TERM GOALS: Target date: 11/04/2022   Pt will perform final HEP w/min A from daughter for improved strength, balance, transfers and gait.  Baseline:  Goal status: INITIAL  2.  Pt will improve normal TUG to less than or equal to  70 seconds w/LRAD and min verbal cues for improved functional  mobility and decreased fall risk.  Baseline: 89.34sw/RW and max verbal cues; 74.75s w/RW and CGA (3/28) Goal status: REVISED  3.  Pt will improve 5 x STS to less than or equal to 35 seconds w/proper form to demonstrate improved functional strength and transfer efficiency.   Baseline: 38.03s w/heavy bracing against mat table  Goal status: REVISED  4.  Pt will improve gait velocity to at least 0.9 ft/s with LRAD and S* for improved gait efficiency and safety  Baseline: 0.7 ft/s with RW; 0.88ft/s (3/28) Goal status: REVISED    ASSESSMENT:  CLINICAL IMPRESSION: Patient seen for skilled PT session with emphasis on functional LE strengthening and sit <> stand practice with RW. Patient with strong retropulsion and increased reliance on stable posterior surface to utilize posterior B LE to power up. Rockerboard used to encourage anterior weight shift. Minimal carryover noted without external cuing once rockerboard was withdrawn. Continue POC.    OBJECTIVE IMPAIRMENTS: Abnormal gait, decreased activity tolerance, decreased balance, decreased cognition, decreased coordination, decreased endurance, decreased knowledge of condition, decreased knowledge of use of DME, decreased mobility, difficulty walking, decreased strength, decreased safety awareness, impaired perceived functional ability, and improper body mechanics  ACTIVITY LIMITATIONS: carrying, lifting, sitting, standing, squatting, stairs, transfers, bathing, toileting, dressing, hygiene/grooming, and locomotion level  PARTICIPATION LIMITATIONS: meal prep, cleaning, laundry, medication management, personal finances, driving, shopping, community activity, and yard work  PERSONAL FACTORS: Age, Fitness, Past/current experiences, and 3+ comorbidities: basal ganglia CVA, HTN, seizures  are also affecting patient's functional outcome.   REHAB POTENTIAL: Fair due to severity of  deficits and cognitive impairment  CLINICAL DECISION MAKING: Evolving/moderate complexity  EVALUATION COMPLEXITY: Moderate  PLAN:  PT FREQUENCY: 2x/week  PT DURATION: 8 weeks  PLANNED INTERVENTIONS: Therapeutic exercises, Therapeutic activity, Neuromuscular re-education, Balance training, Gait training, Patient/Family education, Self Care, Joint mobilization, Stair training, Vestibular training, Canalith repositioning, DME instructions, Aquatic Therapy, Manual therapy, and Re-evaluation  PLAN FOR NEXT SESSION:  Check BP. add to HEP- lateral and retro stepping. Work on sit <>stands, increased step length, endurance, LE coordination, anterior weight shifting. Alt stepping over beam -fwd and lat    Westley Foots, PT, DPT, CBIS 10/19/2022, 3:54 PM

## 2022-10-19 NOTE — Therapy (Unsigned)
OUTPATIENT OCCUPATIONAL THERAPY NEURO TREATMENT NOTE  Patient Name: Carlos Shields MRN: [id number] DOB:[birth year], 89 y.o., male Today's Date: 10/19/2022  PCP: Norva Riffle, PA REFERRING PROVIDER: Charlton Amor, PA-C   END OF SESSION:  OT End of Session - 10/19/22 1418     Visit Number 5    Number of Visits 12    Date for OT Re-Evaluation 11/05/22    Authorization Type UHC    OT Start Time 1445    OT Stop Time 1529    OT Time Calculation (min) 44 min    Activity Tolerance Patient tolerated treatment well    Behavior During Therapy WFL for tasks assessed/performed             Past Medical History:  Diagnosis Date   CKD (chronic kidney disease) stage 3, GFR 30-59 ml/min    Gait abnormality    Hypertension    Pancreatitis    Seizures    Venous insufficiency    Past Surgical History:  Procedure Laterality Date   HERNIA REPAIR     TRANSCAROTID ARTERY REVASCULARIZATION  Left 08/12/2022   Procedure: Transcarotid Artery Revascularization;  Surgeon: Maeola Harman, MD;  Location: [facility] OR;  Service: Vascular;  Laterality: Left;   Patient Active Problem List   Diagnosis Date Noted   Mild vascular dementia 10/18/2022   CVA (cerebrovascular accident) 08/14/2022   Acute-on-chronic kidney injury 08/05/2022   CVA (cerebral vascular accident) 08/05/2022   Dysphagia 08/05/2022   Abnormal TSH 08/05/2022   Acute metabolic encephalopathy 08/05/2022   CKD (chronic kidney disease), stage III 04/26/2021   Weakness generalized 04/26/2021   Weakness 04/25/2021   Syncope 01/13/2012   Anemia 12/09/2011   Seizures 12/08/2011   HTN (hypertension) 12/08/2011   Encephalopathy 12/08/2011    ONSET DATE: acute on chronic (discovered mini-strokes on 08/05/22)   REFERRING DIAG: I63.09 (ICD-10-CM) - Cerebrovascular accident (CVA) due to thrombosis of other precerebral artery (HCC)   THERAPY DIAG:  Other lack of coordination  Muscle weakness (generalized)  Cerebrovascular  accident (CVA) due to thrombosis of other precerebral artery  Rationale for Evaluation and Treatment: Rehabilitation  PERTINENT HISTORY: Per hospital D/C note 08/25/22: "89 y.o. right-handed male with history of seizure maintained on Keppra, hypertension, CKD stage III, pancreatitis, venous insufficiency and dysphagia who was admitted 08/05/2022 for right-sided weakness, incontinence, seizure-like activity and fall...MRI of the brain 08/05/2022 which revealed multiple small acute infarcts involving the left basal ganglia, left frontal, parietal and occipital cortex and adjacent white matter with slight edema but no mass effect...underwent left transcarotid artery revascularization 08/12/2022 per Dr. Randie Heinz." He was admitted to inpatient rehab and recently discharged on 08/27/22, and the hospital OT left this note about function at discharge: "Patient to discharge at overall Supervision/CGA level.  Patient's care partner is independent to provide the necessary physical and cognitive assistance at discharge.Marland KitchenMarland KitchenPatient still requires CGA for dynamic balance which affects LB dressing and toileting tasks.Marland KitchenMarland KitchenPatient will benefit from ongoing skilled OT services in outpatient setting to continue to advance functional skills in the area of BADL, Reduce care partner burden, and functional use of R UE."  Per OT eval: "He is accompanied by his daughter. He was having some falls, doing worse over time, until daughter took him to the hospital on 08/05/22.  It was determined there that he had had some subacute infarcts in the left lobe of the brain and basal ganglia.  She states he's had a bit worse function now than previously, and she moved him into her  home as she was concerned about his safety living alone.  She does the IADLs and assists with some BADL's."   PRECAUTIONS: Fall; WEIGHT BEARING RESTRICTIONS: No   SUBJECTIVE:   SUBJECTIVE STATEMENT: His daughter was unable to find cards for him to use at home. She has been  trying to remind him to use his right hand since his last OT session.   Pt accompanied by:  daughter  PAIN:  Are you having pain?  Not now at rest Rating: 0/10 at rest now, up to 0/10 at worst in past week   FALLS: Has patient fallen in last 6 months? Yes. Number of falls "about 5 times"   LIVING ENVIRONMENT: Lives with: lives with their daughter Lives in: House/apartment Stairs: No Has following equipment at home: Environmental consultantWalker - 2 wheeled, Tour managerhower bench, and adapted toilet  PLOF: Independent with basic ADLs, Independent with household mobility with device, and Independent with community mobility with device  PATIENT GOALS: to improve strength, motion, coordination in Rt side and with overall function, if possible    OBJECTIVE: (All objective assessments below are from initial evaluation on: 09/15/22 unless otherwise specified.)   HAND DOMINANCE: Right   ADLs: Overall ADLs: Daughter states she gives Mod A for dressing, has adapted toilet with rails and toilets himself, he sponge bathes with supervision, uses a walker at home and when out for mobility. He used to use the microwave, but hasn't after hospitalization. He now wets the bed more, which did not used to be so problematic.   FUNCTIONAL OUTCOME MEASURES: Eval: Quck DASH 25% impairment today  (Higher % Score  =  More Impairment)   UPPER EXTREMITY ROM     Shoulder to Wrist AROM Right eval Left eval  Shoulder flexion 90 130  Shoulder abduction 90 130  Elbow flexion 130 155  Elbow extension 0 0  Forearm supination WFL WFL  Forearm pronation  [facility]  Wrist flexion Tight but functional Tight but functional  Wrist extension Tight but functional Tight but functional  (Blank rows = not tested)   Hand AROM Right eval Left eval  Full Fist Ability (or Gap to Distal Palmar Crease) full full  Thumb Opposition  (Kapandji Scale)  6 6  (Blank rows = not tested)   UPPER EXTREMITY MMT:     MMT Right 09/15/22 Left 09/15/22   Shoulder flexion 4/5 5/5  Elbow flexion 4/5 4+/5  Elbow extension 4/5 4+/5  (Blank rows = not tested)  HAND FUNCTION: Eval: Observed weakness in affected hand.  Grip strength Right: 48 lbs, Left: 57 lbs   COORDINATION: Eval: Observed coordination impairments with affected hand. Box and Blocks Test: Rt: 20 blocks, Lt 21 blocks today   COGNITION: Eval: Overall cognitive status: He was quiet and seemingly dependent on his daughter to answer most questions for him. He seemed to have at least mild recall/memory issues as well, and small problems following simple commands at times.   OBSERVATIONS:   Eval: Overall he presents as somewhat more stiff, slower coordination, and weaker in dominant right upper extremity today.   TODAY'S TREATMENT:  Patient participated in 1 game of Solitaire requiring the patient to shuffle, deal, hold cards, play cards onto table, and turn cards over with affected R hand for fine motor coordination and pinch strength. He required max cues to only utilize RUE.  Therapist and patient participated in 1 game of War requiring patient to shuffle, deal, hold cards, play cards onto table, and turn cards over  with affected R hand for fine motor coordination and pinch strength  PATIENT EDUCATION: Education details: See tx section above for details  Person educated: Patient Education method: Verbal Instruction, tactile cueing  Education comprehension: States and demonstrates understanding, Additional Education required    HOME EXERCISE PROGRAM: Access Code: [email or web address] URL: [email or web address]  GOALS: Goals reviewed with patient? Yes   SHORT TERM GOALS: (STG required if POC>30 days) Target Date: 10/01/22  Pt will demo/state understanding of initial HEP to improve pain levels and prerequisite motion. Goal status: 09/29/22: MET   LONG TERM GOALS: Target Date: 11/05/22  Pt will improve functional ability by decreased impairment per Quick DASH  assessment from 25% to 15% or better, for better quality of life. Goal status: INITIAL  2.  Pt will improve grip strength in Rt hand hand from 48lbs to at least 55lbs for functional use at home and in IADLs. Goal status: INITIAL  3.  Pt will improve A/ROM in Rt sh flex and abd from 90* each to at least 120* each, to have functional motion for tasks like reach and grasp.  Goal status: INITIAL  4.  Pt will improve strength in Rt elbow from 4/5 MMT to at least 4+/5 MMT to have increased functional ability to carry out selfcare and higher-level tasks with less difficulty. Goal status: INITIAL  5.  Pt will improve coordination skills in b/l UEs, as seen by better score (at least 30 blocks in both UEs) on BBT testing to have increased functional ability to carry out fine motor tasks (fasteners, etc.) and more complex, coordinated IADLs as able.  Goal status: INITIAL   ASSESSMENT:  CLINICAL IMPRESSION: Pt demonstrates tendency to utilize LUE in place of RUE requiring cues. Possible R - side inattention vs learned non-use. Requires significantly increased time for task completion but fairly accurate with processing throughout.   PERFORMANCE DEFICITS: in functional skills including ADLs, IADLs, coordination, dexterity, ROM, strength, flexibility, Fine motor control, mobility, balance, body mechanics, endurance, decreased knowledge of precautions, and UE functional use, cognitive skills including attention, energy/drive, memory, problem solving, safety awareness, and thought, and psychosocial skills including coping strategies, environmental adaptation, habits, and routines and behaviors.    PLAN:  OT FREQUENCY: 2x/week  OT DURATION: 8 weeks  PLANNED INTERVENTIONS: self care/ADL training, therapeutic exercise, therapeutic activity, neuromuscular re-education, manual therapy, passive range of motion, functional mobility training, compression bandaging, moist heat, cryotherapy, contrast bath,  patient/family education, cognitive remediation/compensation, visual/perceptual remediation/compensation, energy conservation, coping strategies training, and DME and/or AE instructions  CONSULTED AND AGREED WITH PLAN OF CARE: Patient and family member/caregiver  RECOMMENDED OTHER SERVICES: Speech therapy evaluation due to concern for oral motor control as pt observed to drool during back to back OT sessions and pt's daughter reports increase in drooling acutely.   PLAN FOR NEXT SESSION:  continue working on mobility, strength, function, etc.    Delana Meyer, OT 10/19/2022, 5:01 PM

## 2022-10-20 ENCOUNTER — Telehealth: Payer: Self-pay | Admitting: Occupational Therapy

## 2022-10-20 NOTE — Telephone Encounter (Signed)
OT faxed visit note from 10/19/2022 to PCP with request for Speech Therapy order due to concerns for decline in oral motor control/drooling.

## 2022-10-22 ENCOUNTER — Encounter: Payer: Self-pay | Admitting: Occupational Therapy

## 2022-10-22 ENCOUNTER — Ambulatory Visit: Payer: Medicare Other | Admitting: Occupational Therapy

## 2022-10-22 ENCOUNTER — Ambulatory Visit: Payer: Medicare Other | Admitting: Physical Therapy

## 2022-10-22 VITALS — BP 144/63 | HR 61

## 2022-10-22 DIAGNOSIS — M6281 Muscle weakness (generalized): Secondary | ICD-10-CM | POA: Diagnosis not present

## 2022-10-22 DIAGNOSIS — R2681 Unsteadiness on feet: Secondary | ICD-10-CM | POA: Diagnosis not present

## 2022-10-22 DIAGNOSIS — R278 Other lack of coordination: Secondary | ICD-10-CM

## 2022-10-22 DIAGNOSIS — I6309 Cerebral infarction due to thrombosis of other precerebral artery: Secondary | ICD-10-CM | POA: Diagnosis not present

## 2022-10-22 DIAGNOSIS — R2689 Other abnormalities of gait and mobility: Secondary | ICD-10-CM | POA: Diagnosis not present

## 2022-10-22 DIAGNOSIS — R293 Abnormal posture: Secondary | ICD-10-CM

## 2022-10-22 NOTE — Therapy (Signed)
OUTPATIENT PHYSICAL THERAPY NEURO TREATMENT    Patient Name: Carlos Shields MRN: [id number] DOB:1930/11/23, 89 y.o., male Today's Date: 10/22/2022   PCP: Norm Salt, PA REFERRING PROVIDER: Charlton Amor, PA-C     END OF SESSION:  PT End of Session - 10/22/22 1231     Visit Number 11    Number of Visits 17    Date for PT Re-Evaluation 11/04/22    Authorization Type [employer] Medicare    Progress Note Due on Visit 10    PT Start Time 1231    PT Stop Time 1313    PT Time Calculation (min) 42 min    Equipment Utilized During Treatment Gait belt    Activity Tolerance Patient tolerated treatment well    Behavior During Therapy WFL for tasks assessed/performed                  Past Medical History:  Diagnosis Date   CKD (chronic kidney disease) stage 3, GFR 30-59 ml/min    Gait abnormality    Hypertension    Pancreatitis    Seizures    Venous insufficiency    Past Surgical History:  Procedure Laterality Date   HERNIA REPAIR     TRANSCAROTID ARTERY REVASCULARIZATION  Left 08/12/2022   Procedure: Transcarotid Artery Revascularization;  Surgeon: Maeola Harman, MD;  Location: [facility] OR;  Service: Vascular;  Laterality: Left;   Patient Active Problem List   Diagnosis Date Noted   Mild vascular dementia 10/18/2022   CVA (cerebrovascular accident) 08/14/2022   Acute-on-chronic kidney injury 08/05/2022   CVA (cerebral vascular accident) 08/05/2022   Dysphagia 08/05/2022   Abnormal TSH 08/05/2022   Acute metabolic encephalopathy 08/05/2022   CKD (chronic kidney disease), stage III 04/26/2021   Weakness generalized 04/26/2021   Weakness 04/25/2021   Syncope 01/13/2012   Anemia 12/09/2011   Seizures 12/08/2011   HTN (hypertension) 12/08/2011   Encephalopathy 12/08/2011    ONSET DATE: 08/25/2022  REFERRING DIAG: I63.09 (ICD-10-CM) - Cerebrovascular accident (CVA) due to thrombosis of other precerebral artery (HCC)  THERAPY DIAG:  Unsteadiness on  feet  Other lack of coordination  Abnormal posture  Rationale for Evaluation and Treatment: Rehabilitation  SUBJECTIVE:                                                                                                                                                                                             SUBJECTIVE STATEMENT: Patient reports doing well. Daughter reports pt keeps "reaching out of the walker" at home and requiring cues to use walker properly. No falls.   Pt accompanied by: self and daughter,  [name]    PERTINENT HISTORY: He underwent MRI of the brain 08/05/2022 which revealed multiple small acute infarcts involving the left basal ganglia, left frontal, parietal and occipital cortex and adjacent white matter with slight edema but no mass effect. He was admitted and underwent MRI of the head 08/06/2022 which showed intracranial atherosclerotic disease with associated moderate stenosis at the left carotid siphon with additional severe distal L P3 stenosis. Vascular surgery consulted he underwent CTA of the neck 08/08/2022 which showed proximal left ICA stenosis 75% and 65% on the right and subsequently underwent left transcarotid artery revascularization 08/12/2022 per Dr. Randie Heinz. PMH: Seizure maintained on Keppra, hypertension, CKD stage III, pancreatitis, venous insufficiency and dysphagia who was admitted 08/05/2022 for right-sided weakness, incontinence, seizure-like activity and fall.  PAIN:  Are you having pain? No  VITALS Vitals:   10/22/22 1238  BP: (!) 144/63  Pulse: 61        PRECAUTIONS: Fall and Other: seizure   PATIENT GOALS: per daughter, pt would like to work on walking   TODAY'S TREATMENT:   NMR  DL to 14" box catty-cornered behind pt while using 5# KB for facilitation of hip hinge, hip extension strength and posture. Min-mod A throughout to position pt and for steadying assist as pt has retropulsion tendency. Pt performed 15 reps w/good facilitation of hip  hinge but could not obtain full hip extension.  Blaze pods on random reach setting, 3x1 min intervals, w/pt sitting on green dynadisc. Placed  4 pods on floor, one on chair to R side and one on 14" box to L side of pt for improved core stability and lateral reaching: Round 1: 12 hits  Round 2: 20 hits  Round 3: 13 hits  Pt had most difficulty tapping pods w/BUEs    Ther Ex  SciFit multi-peaks level 5 for 8 minutes using BUE/BLEs for neural priming for reciprocal movement, dynamic cardiovascular warmup and increased amplitude of stepping.    Gait pattern: step through pattern, decreased step length- Right, decreased step length- Left, decreased stride length, shuffling, trunk flexed, narrow BOS, poor foot clearance- Right, and poor foot clearance- Left Distance walked: Various clinic distances  Assistive device utilized: Walker - 2 wheeled Level of assistance: SBA Comments: Mod verbal cues to take LARGE steps towards red band tied on front end of walker. Pt requires min-mod cues for proper AD management while turning to sit or reaching for objects.                                              PATIENT EDUCATION: Education details: continue HEP Person educated: Patient and Child(ren) Education method: Explanation, Demonstration, Tactile cues, and Verbal cues Education comprehension: verbal cues required, tactile cues required, and needs further education  HOME EXERCISE PROGRAM: Access Code: [email or web address] URL: [email or web address] Date: 09/13/2022 Prepared by: Alethia Berthold Ayani Ospina  Exercises - Standing Quarter Turn with Counter Support  - 1 x daily - 7 x weekly - 3 sets - 10 reps - Sit to Stand with Armchair  - 1 x daily - 7 x weekly - 3 sets - 10 reps  GOALS: Goals reviewed with patient? Yes  SHORT TERM GOALS: Target date: 10/07/2022   Pt will perform initial HEP w/mod A from daughter for improved balance, safety and strength Baseline: not established on eval  Goal status:  MET  2.  Pt will  improve gait velocity to at least 0.9 ft/s with LRAD and CGA for improved gait efficiency   Baseline: 0.7 ft/s with RW and CGA; 0.65 ft/s (3/28)  Goal status: NOT MET  3.  Pt will perform single sit <>stand in under 45s w/mod verbal cues for improved independence and safety at home.  Baseline: 63s w/max verbal cues for proper sequencing; 12.53s w/min cues  Goal status: MET  4.  Pt will improve normal TUG to less than or equal to 80 seconds w/LRAD and mod verbal cues for improved functional mobility and decreased fall risk.  Baseline: 89.34sw/RW and max verbal cues; 74.75s w/RW and CGA (3/28) Goal status: MET  LONG TERM GOALS: Target date: 11/04/2022   Pt will perform final HEP w/min A from daughter for improved strength, balance, transfers and gait.  Baseline:  Goal status: INITIAL  2.  Pt will improve normal TUG to less than or equal to 70 seconds w/LRAD and min verbal cues for improved functional mobility and decreased fall risk.  Baseline: 89.34sw/RW and max verbal cues; 74.75s w/RW and CGA (3/28) Goal status: REVISED  3.  Pt will improve 5 x STS to less than or equal to 35 seconds w/proper form to demonstrate improved functional strength and transfer efficiency.   Baseline: 38.03s w/heavy bracing against mat table  Goal status: REVISED  4.  Pt will improve gait velocity to at least 0.9 ft/s with LRAD and S* for improved gait efficiency and safety  Baseline: 0.7 ft/s with RW; 0.58ft/s (3/28) Goal status: REVISED    ASSESSMENT:  CLINICAL IMPRESSION: Emphasis of skilled PT session on postural control, core stability and reaching out of BOS. Pt continues to require min-mod cues for safe AD management in clinic and at home, but pt's daughter reports pt has improved with mobility since starting therapy. Pt demonstrates forward flexed posture and inability to obtain full extension due to posterior LOB. Continue POC.    OBJECTIVE IMPAIRMENTS: Abnormal gait,  decreased activity tolerance, decreased balance, decreased cognition, decreased coordination, decreased endurance, decreased knowledge of condition, decreased knowledge of use of DME, decreased mobility, difficulty walking, decreased strength, decreased safety awareness, impaired perceived functional ability, and improper body mechanics  ACTIVITY LIMITATIONS: carrying, lifting, sitting, standing, squatting, stairs, transfers, bathing, toileting, dressing, hygiene/grooming, and locomotion level  PARTICIPATION LIMITATIONS: meal prep, cleaning, laundry, medication management, personal finances, driving, shopping, community activity, and yard work  PERSONAL FACTORS: Age, Fitness, Past/current experiences, and 3+ comorbidities: basal ganglia CVA, HTN, seizures  are also affecting patient's functional outcome.   REHAB POTENTIAL: Fair due to severity of deficits and cognitive impairment  CLINICAL DECISION MAKING: Evolving/moderate complexity  EVALUATION COMPLEXITY: Moderate  PLAN:  PT FREQUENCY: 2x/week  PT DURATION: 8 weeks  PLANNED INTERVENTIONS: Therapeutic exercises, Therapeutic activity, Neuromuscular re-education, Balance training, Gait training, Patient/Family education, Self Care, Joint mobilization, Stair training, Vestibular training, Canalith repositioning, DME instructions, Aquatic Therapy, Manual therapy, and Re-evaluation  PLAN FOR NEXT SESSION:  Check BP. Practice floor transfer with pt and daughter. add to HEP- lateral and retro stepping. Work on sit <>stands, increased step length, endurance, LE coordination, anterior weight shifting. Alt stepping over beam -fwd and lat    Zakery Normington E Tramon Crescenzo, PT, DPT  10/22/2022, 2:07 PM

## 2022-10-22 NOTE — Therapy (Signed)
OUTPATIENT OCCUPATIONAL THERAPY NEURO TREATMENT NOTE  Patient Name: Carlos Shields MRN: [id number] DOB:August 13, 1930, 89 y.o., male Today's Date: 10/22/2022  PCP: Carlos Riffle, PA REFERRING PROVIDER: Charlton Amor, PA-C   END OF SESSION:  OT End of Session - 10/22/22 1458     Visit Number 6    Number of Visits 12    Date for OT Re-Evaluation 11/05/22    Authorization Type UHC    OT Start Time 1153    OT Stop Time 1231    OT Time Calculation (min) 38 min    Activity Tolerance Patient tolerated treatment well    Behavior During Therapy WFL for tasks assessed/performed              Past Medical History:  Diagnosis Date   CKD (chronic kidney disease) stage 3, GFR 30-59 ml/min    Gait abnormality    Hypertension    Pancreatitis    Seizures    Venous insufficiency    Past Surgical History:  Procedure Laterality Date   HERNIA REPAIR     TRANSCAROTID ARTERY REVASCULARIZATION  Left 08/12/2022   Procedure: Transcarotid Artery Revascularization;  Surgeon: Carlos Harman, MD;  Location: [facility] OR;  Service: Vascular;  Laterality: Left;   Patient Active Problem List   Diagnosis Date Noted   Mild vascular dementia 10/18/2022   CVA (cerebrovascular accident) 08/14/2022   Acute-on-chronic kidney injury 08/05/2022   CVA (cerebral vascular accident) 08/05/2022   Dysphagia 08/05/2022   Abnormal TSH 08/05/2022   Acute metabolic encephalopathy 08/05/2022   CKD (chronic kidney disease), stage III 04/26/2021   Weakness generalized 04/26/2021   Weakness 04/25/2021   Syncope 01/13/2012   Anemia 12/09/2011   Seizures 12/08/2011   HTN (hypertension) 12/08/2011   Encephalopathy 12/08/2011    ONSET DATE: acute on chronic (discovered mini-strokes on 08/05/22)   REFERRING DIAG: I63.09 (ICD-10-CM) - Cerebrovascular accident (CVA) due to thrombosis of other precerebral artery (HCC)   THERAPY DIAG:  Other lack of coordination  Muscle weakness (generalized)  Cerebrovascular  accident (CVA) due to thrombosis of other precerebral artery  Rationale for Evaluation and Treatment: Rehabilitation  PERTINENT HISTORY: Per hospital D/C note 08/25/22: "89 y.o. right-handed male with history of seizure maintained on Keppra, hypertension, CKD stage III, pancreatitis, venous insufficiency and dysphagia who was admitted 08/05/2022 for right-sided weakness, incontinence, seizure-like activity and fall...MRI of the brain 08/05/2022 which revealed multiple small acute infarcts involving the left basal ganglia, left frontal, parietal and occipital cortex and adjacent white matter with slight edema but no mass effect...underwent left transcarotid artery revascularization 08/12/2022 per Dr. Randie Shields." He was admitted to inpatient rehab and recently discharged on 08/27/22, and the hospital OT left this note about function at discharge: "Patient to discharge at overall Supervision/CGA level.  Patient's care partner is independent to provide the necessary physical and cognitive assistance at discharge.Marland KitchenMarland KitchenPatient still requires CGA for dynamic balance which affects LB dressing and toileting tasks.Marland KitchenMarland KitchenPatient will benefit from ongoing skilled OT services in outpatient setting to continue to advance functional skills in the area of BADL, Reduce care partner burden, and functional use of R UE."  Per OT eval: "He is accompanied by his daughter. He was having some falls, doing worse over time, until daughter took him to the hospital on 08/05/22.  It was determined there that he had had some subacute infarcts in the left lobe of the brain and basal ganglia.  She states he's had a bit worse function now than previously, and she moved him into  her home as she was concerned about his safety living alone.  She does the IADLs and assists with some BADL's."   PRECAUTIONS: Fall; WEIGHT BEARING RESTRICTIONS: No   SUBJECTIVE:   SUBJECTIVE STATEMENT: His daughter states he always asks for help to don or doff his jacket.   Pt  accompanied by:  daughter  PAIN:  Are you having pain?  Not now at rest  Rating: 0/10 at rest now, up to 0/10 at worst in past week   FALLS: Has patient fallen in last 6 months? Yes. Number of falls "about 5 times"   LIVING ENVIRONMENT: Lives with: lives with their daughter Lives in: House/apartment Stairs: No Has following equipment at home: Environmental consultant - 2 wheeled, Tour manager, and adapted toilet  PLOF: Independent with basic ADLs, Independent with household mobility with device, and Independent with community mobility with device  PATIENT GOALS: to improve strength, motion, coordination in Rt side and with overall function, if possible    OBJECTIVE: (All objective assessments below are from initial evaluation on: 09/15/22 unless otherwise specified.)   HAND DOMINANCE: Right   ADLs: Overall ADLs: Daughter states she gives Mod A for dressing, has adapted toilet with rails and toilets himself, he sponge bathes with supervision, uses a walker at home and when out for mobility. He used to use the microwave, but hasn't after hospitalization. He now wets the bed more, which did not used to be so problematic.   FUNCTIONAL OUTCOME MEASURES: Eval: Quck DASH 25% impairment today  (Higher % Score  =  More Impairment)   UPPER EXTREMITY ROM     Shoulder to Wrist AROM Right eval Left eval  Shoulder flexion 90 130  Shoulder abduction 90 130  Elbow flexion 130 155  Elbow extension 0 0  Forearm supination WFL WFL  Forearm pronation  [facility]  Wrist flexion Tight but functional Tight but functional  Wrist extension Tight but functional Tight but functional  (Blank rows = not tested)   Hand AROM Right eval Left eval  Full Fist Ability (or Gap to Distal Palmar Crease) full full  Thumb Opposition  (Kapandji Scale)  6 6  (Blank rows = not tested)   UPPER EXTREMITY MMT:     MMT Right 09/15/22 Left 09/15/22  Shoulder flexion 4/5 5/5  Elbow flexion 4/5 4+/5  Elbow extension 4/5 4+/5   (Blank rows = not tested)  HAND FUNCTION: Eval: Observed weakness in affected hand.  Grip strength Right: 48 lbs, Left: 57 lbs   COORDINATION: Eval: Observed coordination impairments with affected hand. Box and Blocks Test: Rt: 20 blocks, Lt 21 blocks today   COGNITION: Eval: Overall cognitive status: He was quiet and seemingly dependent on his daughter to answer most questions for him. He seemed to have at least mild recall/memory issues as well, and small problems following simple commands at times.   OBSERVATIONS:   Eval: Overall he presents as somewhat more stiff, slower coordination, and weaker in dominant right upper extremity today.   TODAY'S TREATMENT:  - Therapeutic exercises completed for duration as noted below including: Therapist assisted pt with completion of dowel shoulder flex, ER, horizontal shoulder abduction, wrist flex/ext, chest presses, and elbow flex/ext stretches with cues for hand positioning, reaching end range, and holding stretch.  Patient also completed beanbag toss using affected RUE into basket positioned approximately 10 feet front of patient to promote improved ROM and strengthening with pt able to toss bean bag into basket by tossing overhead.  - Self-care/home  management completed for duration as noted below including: OT educated pt and daughter on donning jacket using cape method as well as placing RUE into sleeve first and doffing removing LUE first.  PATIENT EDUCATION:  Education details: See tx section above for details  Person educated: Patient and daughter Education method: Verbal Instruction, tactile cueing  Education comprehension: States and demonstrates understanding, Additional Education required    HOME EXERCISE PROGRAM:  Access Code: [email or web address] URL: [email or web address]  GOALS: Goals reviewed with patient? Yes   SHORT TERM GOALS: (STG required if POC>30 days) Target Date: 10/01/22  Pt will demo/state  understanding of initial HEP to improve pain levels and prerequisite motion. Goal status: 09/29/22: MET   LONG TERM GOALS: Target Date: 11/05/22  Pt will improve functional ability by decreased impairment per Quick DASH assessment from 25% to 15% or better, for better quality of life. Goal status: INITIAL  2.  Pt will improve grip strength in Rt hand hand from 48lbs to at least 55lbs for functional use at home and in IADLs. Goal status: INITIAL  3.  Pt will improve A/ROM in Rt sh flex and abd from 90* each to at least 120* each, to have functional motion for tasks like reach and grasp.  Goal status: INITIAL  4.  Pt will improve strength in Rt elbow from 4/5 MMT to at least 4+/5 MMT to have increased functional ability to carry out selfcare and higher-level tasks with less difficulty. Goal status: INITIAL  5.  Pt will improve coordination skills in b/l UEs, as seen by better score (at least 30 blocks in both UEs) on BBT testing to have increased functional ability to carry out fine motor tasks (fasteners, etc.) and more complex, coordinated IADLs as able.  Goal status: INITIAL   ASSESSMENT:  CLINICAL IMPRESSION: Pt demonstrates tendency to utilize LUE in place of RUE requiring cues. Requires significantly increased time for task completion but fairly accurate with processing throughout.   PERFORMANCE DEFICITS: in functional skills including ADLs, IADLs, coordination, dexterity, ROM, strength, flexibility, Fine motor control, mobility, balance, body mechanics, endurance, decreased knowledge of precautions, and UE functional use, cognitive skills including attention, energy/drive, memory, problem solving, safety awareness, and thought, and psychosocial skills including coping strategies, environmental adaptation, habits, and routines and behaviors.    PLAN:  OT FREQUENCY: 2x/week  OT DURATION: 8 weeks  PLANNED INTERVENTIONS: self care/ADL training, therapeutic exercise, therapeutic  activity, neuromuscular re-education, manual therapy, passive range of motion, functional mobility training, compression bandaging, moist heat, cryotherapy, contrast bath, patient/family education, cognitive remediation/compensation, visual/perceptual remediation/compensation, energy conservation, coping strategies training, and DME and/or AE instructions  CONSULTED AND AGREED WITH PLAN OF CARE: Patient and family member/caregiver  RECOMMENDED OTHER SERVICES: Speech therapy evaluation due to concern for oral motor control as pt observed to drool during back to back OT sessions and pt's daughter reports increase in drooling acutely.   PLAN FOR NEXT SESSION:  continue working on mobility, strength, function, etc.    Delana Meyer, OT 10/22/2022, 3:19 PM

## 2022-10-26 ENCOUNTER — Encounter: Payer: Self-pay | Admitting: Occupational Therapy

## 2022-10-26 ENCOUNTER — Ambulatory Visit: Payer: Medicare Other | Admitting: Occupational Therapy

## 2022-10-26 ENCOUNTER — Ambulatory Visit: Payer: Medicare Other | Admitting: Physical Therapy

## 2022-10-26 DIAGNOSIS — M6281 Muscle weakness (generalized): Secondary | ICD-10-CM

## 2022-10-26 DIAGNOSIS — R278 Other lack of coordination: Secondary | ICD-10-CM

## 2022-10-26 DIAGNOSIS — R293 Abnormal posture: Secondary | ICD-10-CM | POA: Diagnosis not present

## 2022-10-26 DIAGNOSIS — R2681 Unsteadiness on feet: Secondary | ICD-10-CM

## 2022-10-26 DIAGNOSIS — R2689 Other abnormalities of gait and mobility: Secondary | ICD-10-CM | POA: Diagnosis not present

## 2022-10-26 DIAGNOSIS — I6309 Cerebral infarction due to thrombosis of other precerebral artery: Secondary | ICD-10-CM

## 2022-10-26 NOTE — Therapy (Signed)
OUTPATIENT PHYSICAL THERAPY NEURO TREATMENT    Patient Name: Carlos Shields MRN: [id number] DOB:[birth year], 89 y.o., male Today's Date: 10/26/2022   PCP: Norm Salt, PA REFERRING PROVIDER: Charlton Amor, PA-C     END OF SESSION:  PT End of Session - 10/26/22 1408     Visit Number 12    Number of Visits 17    Date for PT Re-Evaluation 11/04/22    Authorization Type [employer] Medicare    Progress Note Due on Visit 10    PT Start Time 1404    PT Stop Time 1444    PT Time Calculation (min) 40 min    Equipment Utilized During Treatment Gait belt    Activity Tolerance Patient tolerated treatment well    Behavior During Therapy WFL for tasks assessed/performed                  Past Medical History:  Diagnosis Date   CKD (chronic kidney disease) stage 3, GFR 30-59 ml/min    Gait abnormality    Hypertension    Pancreatitis    Seizures    Venous insufficiency    Past Surgical History:  Procedure Laterality Date   HERNIA REPAIR     TRANSCAROTID ARTERY REVASCULARIZATION  Left 08/12/2022   Procedure: Transcarotid Artery Revascularization;  Surgeon: Maeola Harman, MD;  Location: [facility] OR;  Service: Vascular;  Laterality: Left;   Patient Active Problem List   Diagnosis Date Noted   Mild vascular dementia 10/18/2022   CVA (cerebrovascular accident) 08/14/2022   Acute-on-chronic kidney injury 08/05/2022   CVA (cerebral vascular accident) 08/05/2022   Dysphagia 08/05/2022   Abnormal TSH 08/05/2022   Acute metabolic encephalopathy 08/05/2022   CKD (chronic kidney disease), stage III 04/26/2021   Weakness generalized 04/26/2021   Weakness 04/25/2021   Syncope 01/13/2012   Anemia 12/09/2011   Seizures 12/08/2011   HTN (hypertension) 12/08/2011   Encephalopathy 12/08/2011    ONSET DATE: 08/25/2022  REFERRING DIAG: I63.09 (ICD-10-CM) - Cerebrovascular accident (CVA) due to thrombosis of other precerebral artery (HCC)  THERAPY DIAG:  Muscle weakness  (generalized)  Other lack of coordination  Unsteadiness on feet  Rationale for Evaluation and Treatment: Rehabilitation  SUBJECTIVE:                                                                                                                                                                                             SUBJECTIVE STATEMENT: Patient reports doing the same. Denies acute changes or falls   Pt accompanied by: self and daughter, [name] (in car outside)    PERTINENT HISTORY: He underwent MRI of  the brain 08/05/2022 which revealed multiple small acute infarcts involving the left basal ganglia, left frontal, parietal and occipital cortex and adjacent white matter with slight edema but no mass effect. He was admitted and underwent MRI of the head 08/06/2022 which showed intracranial atherosclerotic disease with associated moderate stenosis at the left carotid siphon with additional severe distal L P3 stenosis. Vascular surgery consulted he underwent CTA of the neck 08/08/2022 which showed proximal left ICA stenosis 75% and 65% on the right and subsequently underwent left transcarotid artery revascularization 08/12/2022 per Dr. Randie Heinz. PMH: Seizure maintained on Keppra, hypertension, CKD stage III, pancreatitis, venous insufficiency and dysphagia who was admitted 08/05/2022 for right-sided weakness, incontinence, seizure-like activity and fall.  PAIN:  Are you having pain? No  VITALS There were no vitals filed for this visit.     PRECAUTIONS: Fall and Other: seizure   PATIENT GOALS: per daughter, pt would like to work on walking   TODAY'S TREATMENT:    Ther Ex  SciFit multi-peaks level 7 for 10 minutes using BUE/BLEs for neural priming for reciprocal movement, dynamic cardiovascular warmup and increased amplitude of stepping.    NMR  Performed floor transfer from mat table to floor mat w/min-mod A for improved fall recovery, sequencing and per daughter's request. Pt able to  perform sit > stand from mat table >half kneel > tall kneel > supine on floor mat w/min verbal cues for sequencing and CGA. Once supine, pt required min A to get to prone and mod A to get into quadruped. Once in quadruped, pt able to crawl to mat table and perform half-kneel, but catapulted himself onto mat table and was unable to obtain seated position. Had pt return to tall kneel and repeat tall kneel > half kneel >standing > turn and sit and pt able to perform w/CGA and min verbal cues. Pt's daughter not present during session, so will need to review technique.    Gait pattern: step through pattern, decreased step length- Right, decreased step length- Left, decreased stride length, shuffling, trunk flexed, narrow BOS, poor foot clearance- Right, and poor foot clearance- Left Distance walked: Various clinic distances  Assistive device utilized: Walker - 2 wheeled Level of assistance: SBA Comments: Mod verbal cues to take LARGE steps towards red band tied on front end of walker. Pt requires min-mod cues for proper AD management while turning to sit or reaching for objects.                                              PATIENT EDUCATION: Education details: continue HEP, floor recovery technique  Person educated: Patient and Child(ren) Education method: Explanation, Demonstration, Tactile cues, and Verbal cues Education comprehension: verbal cues required, tactile cues required, and needs further education  HOME EXERCISE PROGRAM: Access Code: [email or web address] URL: [email or web address] Date: 09/13/2022 Prepared by: Alethia Berthold Marlynn Hinckley  Exercises - Standing Quarter Turn with Counter Support  - 1 x daily - 7 x weekly - 3 sets - 10 reps - Sit to Stand with Armchair  - 1 x daily - 7 x weekly - 3 sets - 10 reps  GOALS: Goals reviewed with patient? Yes  SHORT TERM GOALS: Target date: 10/07/2022   Pt will perform initial HEP w/mod A from daughter for improved balance, safety and  strength Baseline: not established on eval  Goal status: MET  2.  Pt will  improve gait velocity to at least 0.9 ft/s with LRAD and CGA for improved gait efficiency   Baseline: 0.7 ft/s with RW and CGA; 0.65 ft/s (3/28)  Goal status: NOT MET  3.  Pt will perform single sit <>stand in under 45s w/mod verbal cues for improved independence and safety at home.  Baseline: 63s w/max verbal cues for proper sequencing; 12.53s w/min cues  Goal status: MET  4.  Pt will improve normal TUG to less than or equal to 80 seconds w/LRAD and mod verbal cues for improved functional mobility and decreased fall risk.  Baseline: 89.34sw/RW and max verbal cues; 74.75s w/RW and CGA (3/28) Goal status: MET  LONG TERM GOALS: Target date: 11/04/2022   Pt will perform final HEP w/min A from daughter for improved strength, balance, transfers and gait.  Baseline:  Goal status: INITIAL  2.  Pt will improve normal TUG to less than or equal to 70 seconds w/LRAD and min verbal cues for improved functional mobility and decreased fall risk.  Baseline: 89.34sw/RW and max verbal cues; 74.75s w/RW and CGA (3/28) Goal status: REVISED  3.  Pt will improve 5 x STS to less than or equal to 35 seconds w/proper form to demonstrate improved functional strength and transfer efficiency.   Baseline: 38.03s w/heavy bracing against mat table  Goal status: REVISED  4.  Pt will improve gait velocity to at least 0.9 ft/s with LRAD and S* for improved gait efficiency and safety  Baseline: 0.7 ft/s with RW; 0.17ft/s (3/28) Goal status: REVISED    ASSESSMENT:  CLINICAL IMPRESSION: Emphasis of skilled PT session on endurance and fall recovery. Pt continues to require min-mod verbal cues for proper AD management and per daughter, has little carryover of therapist's education at home. Pt able to perform floor transfer today per daughter's request, but daughter not present for session to observe technique so will need to review on  later date. Pt requires concurrent min-mod verbal cues for safe AD management and safety w/transfers, as he tends to step outside of RW or attempt to sit prior to being fully turned. Continue POC.   OBJECTIVE IMPAIRMENTS: Abnormal gait, decreased activity tolerance, decreased balance, decreased cognition, decreased coordination, decreased endurance, decreased knowledge of condition, decreased knowledge of use of DME, decreased mobility, difficulty walking, decreased strength, decreased safety awareness, impaired perceived functional ability, and improper body mechanics  ACTIVITY LIMITATIONS: carrying, lifting, sitting, standing, squatting, stairs, transfers, bathing, toileting, dressing, hygiene/grooming, and locomotion level  PARTICIPATION LIMITATIONS: meal prep, cleaning, laundry, medication management, personal finances, driving, shopping, community activity, and yard work  PERSONAL FACTORS: Age, Fitness, Past/current experiences, and 3+ comorbidities: basal ganglia CVA, HTN, seizures  are also affecting patient's functional outcome.   REHAB POTENTIAL: Fair due to severity of deficits and cognitive impairment  CLINICAL DECISION MAKING: Evolving/moderate complexity  EVALUATION COMPLEXITY: Moderate  PLAN:  PT FREQUENCY: 2x/week  PT DURATION: 8 weeks  PLANNED INTERVENTIONS: Therapeutic exercises, Therapeutic activity, Neuromuscular re-education, Balance training, Gait training, Patient/Family education, Self Care, Joint mobilization, Stair training, Vestibular training, Canalith repositioning, DME instructions, Aquatic Therapy, Manual therapy, and Re-evaluation  PLAN FOR NEXT SESSION:  Check BP. Practice floor transfer with pt and daughter. add to HEP- lateral and retro stepping. Work on sit <>stands, increased step length, endurance, LE coordination, anterior weight shifting. Alt stepping over beam -fwd and lat    Eligio Angert E Nason Conradt, PT, DPT  10/26/2022, 2:44 PM

## 2022-10-26 NOTE — Therapy (Signed)
OUTPATIENT OCCUPATIONAL THERAPY NEURO TREATMENT NOTE  Patient Name: Carlos Shields MRN: [id number] DOB:October 11, 1930, 89 y.o., male Today's Date: 10/26/2022  PCP: Norva Riffle, PA REFERRING PROVIDER: Charlton Amor, PA-C   END OF SESSION:  OT End of Session - 10/26/22 1454     Visit Number 7    Number of Visits 12    Date for OT Re-Evaluation 11/05/22    Authorization Type [employer] Medicare    OT Start Time 1447    OT Stop Time 1528    OT Time Calculation (min) 41 min    Activity Tolerance Patient tolerated treatment well    Behavior During Therapy WFL for tasks assessed/performed             Past Medical History:  Diagnosis Date   CKD (chronic kidney disease) stage 3, GFR 30-59 ml/min    Gait abnormality    Hypertension    Pancreatitis    Seizures    Venous insufficiency    Past Surgical History:  Procedure Laterality Date   HERNIA REPAIR     TRANSCAROTID ARTERY REVASCULARIZATION  Left 08/12/2022   Procedure: Transcarotid Artery Revascularization;  Surgeon: Maeola Harman, MD;  Location: [facility] OR;  Service: Vascular;  Laterality: Left;   Patient Active Problem List   Diagnosis Date Noted   Mild vascular dementia 10/18/2022   CVA (cerebrovascular accident) 08/14/2022   Acute-on-chronic kidney injury 08/05/2022   CVA (cerebral vascular accident) 08/05/2022   Dysphagia 08/05/2022   Abnormal TSH 08/05/2022   Acute metabolic encephalopathy 08/05/2022   CKD (chronic kidney disease), stage III 04/26/2021   Weakness generalized 04/26/2021   Weakness 04/25/2021   Syncope 01/13/2012   Anemia 12/09/2011   Seizures 12/08/2011   HTN (hypertension) 12/08/2011   Encephalopathy 12/08/2011    ONSET DATE: acute on chronic (discovered mini-strokes on 08/05/22)   REFERRING DIAG: I63.09 (ICD-10-CM) - Cerebrovascular accident (CVA) due to thrombosis of other precerebral artery (HCC)   THERAPY DIAG:  Other lack of coordination  Muscle weakness  (generalized)  Cerebrovascular accident (CVA) due to thrombosis of other precerebral artery  Rationale for Evaluation and Treatment: Rehabilitation  PERTINENT HISTORY: Per hospital D/C note 08/25/22: "89 y.o. right-handed male with history of seizure maintained on Keppra, hypertension, CKD stage III, pancreatitis, venous insufficiency and dysphagia who was admitted 08/05/2022 for right-sided weakness, incontinence, seizure-like activity and fall...MRI of the brain 08/05/2022 which revealed multiple small acute infarcts involving the left basal ganglia, left frontal, parietal and occipital cortex and adjacent white matter with slight edema but no mass effect...underwent left transcarotid artery revascularization 08/12/2022 per Dr. Randie Heinz." He was admitted to inpatient rehab and recently discharged on 08/27/22, and the hospital OT left this note about function at discharge: "Patient to discharge at overall Supervision/CGA level.  Patient's care partner is independent to provide the necessary physical and cognitive assistance at discharge.Marland KitchenMarland KitchenPatient still requires CGA for dynamic balance which affects LB dressing and toileting tasks.Marland KitchenMarland KitchenPatient will benefit from ongoing skilled OT services in outpatient setting to continue to advance functional skills in the area of BADL, Reduce care partner burden, and functional use of R UE."  Per OT eval: "He is accompanied by his daughter. He was having some falls, doing worse over time, until daughter took him to the hospital on 08/05/22.  It was determined there that he had had some subacute infarcts in the left lobe of the brain and basal ganglia.  She states he's had a bit worse function now than previously, and she moved him into  her home as she was concerned about his safety living alone.  She does the IADLs and assists with some BADL's."   PRECAUTIONS: Fall; WEIGHT BEARING RESTRICTIONS: No   SUBJECTIVE:   SUBJECTIVE STATEMENT: His daughter states he has improved his  ability to don and doff his jacket since his last session.   Pt accompanied by:  daughter  PAIN:  Are you having pain?  Not now at rest  Rating: 0/10 at rest now, up to 0/10 at worst in past week   FALLS: Has patient fallen in last 6 months? Yes. Number of falls "about 5 times"   LIVING ENVIRONMENT: Lives with: lives with their daughter Lives in: House/apartment Stairs: No Has following equipment at home: Environmental consultant - 2 wheeled, Tour manager, and adapted toilet  PLOF: Independent with basic ADLs, Independent with household mobility with device, and Independent with community mobility with device  PATIENT GOALS: to improve strength, motion, coordination in Rt side and with overall function, if possible    OBJECTIVE: (All objective assessments below are from initial evaluation on: 09/15/22 unless otherwise specified.)   HAND DOMINANCE: Right   ADLs: Overall ADLs: Daughter states she gives Mod A for dressing, has adapted toilet with rails and toilets himself, he sponge bathes with supervision, uses a walker at home and when out for mobility. He used to use the microwave, but hasn't after hospitalization. He now wets the bed more, which did not used to be so problematic.   FUNCTIONAL OUTCOME MEASURES: Eval: Quck DASH 25% impairment today  (Higher % Score  =  More Impairment)   UPPER EXTREMITY ROM     Shoulder to Wrist AROM Right eval Left eval  Shoulder flexion 90 130  Shoulder abduction 90 130  Elbow flexion 130 155  Elbow extension 0 0  Forearm supination WFL WFL  Forearm pronation  [facility]  Wrist flexion Tight but functional Tight but functional  Wrist extension Tight but functional Tight but functional  (Blank rows = not tested)   Hand AROM Right eval Left eval  Full Fist Ability (or Gap to Distal Palmar Crease) full full  Thumb Opposition  (Kapandji Scale)  6 6  (Blank rows = not tested)   UPPER EXTREMITY MMT:     MMT Right 09/15/22 Left 09/15/22  Shoulder flexion  4/5 5/5  Elbow flexion 4/5 4+/5  Elbow extension 4/5 4+/5  (Blank rows = not tested)  HAND FUNCTION: Eval: Observed weakness in affected hand.  Grip strength Right: 48 lbs, Left: 57 lbs   COORDINATION: Eval: Observed coordination impairments with affected hand. Box and Blocks Test: Rt: 20 blocks, Lt 21 blocks today   COGNITION: Eval: Overall cognitive status: He was quiet and seemingly dependent on his daughter to answer most questions for him. He seemed to have at least mild recall/memory issues as well, and small problems following simple commands at times.   OBSERVATIONS:   Eval: Overall he presents as somewhat more stiff, slower coordination, and weaker in dominant right upper extremity today.   TODAY'S TREATMENT:  - Therapeutic exercises completed for duration as noted below including: Patient also completed beanbag toss using affected RUE into basket positioned approximately 7 feet front of patient to promote improved ROM and strengthening with pt able to toss bean bag into basket by tossing overhead.  Patient also engaged in ball toss activity with therapist 5-7 feet in front of him, from seated position at edge of mat with ball tossed high (overhead), quickly at times and  outside his immediate base of support to challenge R UE ROM and coordination as well as balance with approximately 60% success with catching ball upon initial toss. Patient engaged in crossing midline with tight grasp of ball with RUE to touch mat beside him on L side, performing diagonal patterns across and towards body/head to simulate ROM needed to grasp his jacket during dressing.  Patient required mod verbal, visual and tactile cues to follow directions to proceed with repetitive and dynamic patterns for improving R UE ROM, strength and coordination.  OT placed 3 BlazePods in front of patient and had patient tap pods with yellow flexbar in R hand as pods lit up using random mode for improved processing, scanning  and locating of items, reaction time, upper extremity range of motion, gross motor coordination.  Hits were as follows: 24 hits, 2,283 ms, over 2 min 30 hits, 1,827 ms, over 1 min    Sitting on therapy ball, pt completed muti-planar reaching with BUE including crossing midline for core stability, postural correction, and ROM.   PATIENT EDUCATION:  Education details: See tx section above for details  Person educated: Patient and daughter Education method: Verbal Instruction, tactile cueing  Education comprehension: States and demonstrates understanding, Additional Education required    HOME EXERCISE PROGRAM:  Access Code: [email or web address] URL: [email or web address]  GOALS: Goals reviewed with patient? Yes   SHORT TERM GOALS: (STG required if POC>30 days) Target Date: 10/01/22  Pt will demo/state understanding of initial HEP to improve pain levels and prerequisite motion. Goal status: 09/29/22: MET   LONG TERM GOALS: Target Date: 11/05/22  Pt will improve functional ability by decreased impairment per Quick DASH assessment from 25% to 15% or better, for better quality of life. Goal status: INITIAL  2.  Pt will improve grip strength in Rt hand hand from 48lbs to at least 55lbs for functional use at home and in IADLs. Goal status: INITIAL  3.  Pt will improve A/ROM in Rt sh flex and abd from 90* each to at least 120* each, to have functional motion for tasks like reach and grasp.  Goal status: INITIAL  4.  Pt will improve strength in Rt elbow from 4/5 MMT to at least 4+/5 MMT to have increased functional ability to carry out selfcare and higher-level tasks with less difficulty. Goal status: INITIAL  5.  Pt will improve coordination skills in b/l UEs, as seen by better score (at least 30 blocks in both UEs) on BBT testing to have increased functional ability to carry out fine motor tasks (fasteners, etc.) and more complex, coordinated IADLs as able.  Goal status:  INITIAL   ASSESSMENT:  CLINICAL IMPRESSION: Pt demonstrates improved volitional use of RUE requiring only min cues. Requires significantly increased time for task completion but fairly accurate with processing throughout. Improvement noted with repetition.   PERFORMANCE DEFICITS: in functional skills including ADLs, IADLs, coordination, dexterity, ROM, strength, flexibility, Fine motor control, mobility, balance, body mechanics, endurance, decreased knowledge of precautions, and UE functional use, cognitive skills including attention, energy/drive, memory, problem solving, safety awareness, and thought, and psychosocial skills including coping strategies, environmental adaptation, habits, and routines and behaviors.    PLAN:  OT FREQUENCY: 2x/week  OT DURATION: 8 weeks  PLANNED INTERVENTIONS: self care/ADL training, therapeutic exercise, therapeutic activity, neuromuscular re-education, manual therapy, passive range of motion, functional mobility training, compression bandaging, moist heat, cryotherapy, contrast bath, patient/family education, cognitive remediation/compensation, visual/perceptual remediation/compensation, energy conservation, coping strategies training, and DME and/or AE instructions  CONSULTED  AND AGREED WITH PLAN OF CARE: Patient and family member/caregiver  RECOMMENDED OTHER SERVICES: Speech therapy evaluation due to concern for oral motor control as pt observed to drool during back to back OT sessions and pt's daughter reports increase in drooling acutely.   PLAN FOR NEXT SESSION:  continue working on mobility, strength, function, etc.   Delana Meyer, OT 10/26/2022, 2:57 PM

## 2022-10-29 ENCOUNTER — Ambulatory Visit: Payer: Medicare Other

## 2022-10-29 DIAGNOSIS — M6281 Muscle weakness (generalized): Secondary | ICD-10-CM | POA: Diagnosis not present

## 2022-10-29 DIAGNOSIS — I6309 Cerebral infarction due to thrombosis of other precerebral artery: Secondary | ICD-10-CM | POA: Diagnosis not present

## 2022-10-29 DIAGNOSIS — R278 Other lack of coordination: Secondary | ICD-10-CM | POA: Diagnosis not present

## 2022-10-29 DIAGNOSIS — R2689 Other abnormalities of gait and mobility: Secondary | ICD-10-CM | POA: Diagnosis not present

## 2022-10-29 DIAGNOSIS — R293 Abnormal posture: Secondary | ICD-10-CM

## 2022-10-29 DIAGNOSIS — R2681 Unsteadiness on feet: Secondary | ICD-10-CM

## 2022-10-29 NOTE — Therapy (Signed)
OUTPATIENT OCCUPATIONAL THERAPY NEURO TREATMENT NOTE  Patient Name: Carlos Shields MRN: [id number] DOB:1930/08/10, 89 y.o., male Today's Date: 10/29/2022  PCP: Norva Riffle, PA REFERRING PROVIDER: Charlton Amor, PA-C   END OF SESSION:  OT End of Session - 10/29/22 1315     Visit Number 8    Number of Visits 12    Date for OT Re-Evaluation 11/05/22    Authorization Type [employer] Medicare    OT Start Time 1328    OT Stop Time 1401    OT Time Calculation (min) 33 min    Equipment Utilized During Treatment red theraband, and 10lb kettlebell    Activity Tolerance Patient tolerated treatment well    Behavior During Therapy WFL for tasks assessed/performed             Past Medical History:  Diagnosis Date   CKD (chronic kidney disease) stage 3, GFR 30-59 ml/min    Gait abnormality    Hypertension    Pancreatitis    Seizures    Venous insufficiency    Past Surgical History:  Procedure Laterality Date   HERNIA REPAIR     TRANSCAROTID ARTERY REVASCULARIZATION  Left 08/12/2022   Procedure: Transcarotid Artery Revascularization;  Surgeon: Maeola Harman, MD;  Location: [facility] OR;  Service: Vascular;  Laterality: Left;   Patient Active Problem List   Diagnosis Date Noted   Mild vascular dementia 10/18/2022   CVA (cerebrovascular accident) 08/14/2022   Acute-on-chronic kidney injury 08/05/2022   CVA (cerebral vascular accident) 08/05/2022   Dysphagia 08/05/2022   Abnormal TSH 08/05/2022   Acute metabolic encephalopathy 08/05/2022   CKD (chronic kidney disease), stage III 04/26/2021   Weakness generalized 04/26/2021   Weakness 04/25/2021   Syncope 01/13/2012   Anemia 12/09/2011   Seizures 12/08/2011   HTN (hypertension) 12/08/2011   Encephalopathy 12/08/2011    ONSET DATE: acute on chronic (discovered mini-strokes on 08/05/22)   REFERRING DIAG: I63.09 (ICD-10-CM) - Cerebrovascular accident (CVA) due to thrombosis of other precerebral artery (HCC)   THERAPY  DIAG:  Muscle weakness (generalized)  Unsteadiness on feet  Cerebrovascular accident (CVA) due to thrombosis of other precerebral artery  Other lack of coordination  Abnormal posture  Rationale for Evaluation and Treatment: Rehabilitation  PERTINENT HISTORY: Per hospital D/C note 08/25/22: "89 y.o. right-handed male with history of seizure maintained on Keppra, hypertension, CKD stage III, pancreatitis, venous insufficiency and dysphagia who was admitted 08/05/2022 for right-sided weakness, incontinence, seizure-like activity and fall...MRI of the brain 08/05/2022 which revealed multiple small acute infarcts involving the left basal ganglia, left frontal, parietal and occipital cortex and adjacent white matter with slight edema but no mass effect...underwent left transcarotid artery revascularization 08/12/2022 per Dr. Randie Heinz." He was admitted to inpatient rehab and recently discharged on 08/27/22, and the hospital OT left this note about function at discharge: "Patient to discharge at overall Supervision/CGA level.  Patient's care partner is independent to provide the necessary physical and cognitive assistance at discharge.Marland KitchenMarland KitchenPatient still requires CGA for dynamic balance which affects LB dressing and toileting tasks.Marland KitchenMarland KitchenPatient will benefit from ongoing skilled OT services in outpatient setting to continue to advance functional skills in the area of BADL, Reduce care partner burden, and functional use of R UE."  Per OT eval: "He is accompanied by his daughter. He was having some falls, doing worse over time, until daughter took him to the hospital on 08/05/22.  It was determined there that he had had some subacute infarcts in the left lobe of the brain and  basal ganglia.  She states he's had a bit worse function now than previously, and she moved him into her home as she was concerned about his safety living alone.  She does the IADLs and assists with some BADL's."   PRECAUTIONS: Fall; WEIGHT BEARING  RESTRICTIONS: No   SUBJECTIVE:   SUBJECTIVE STATEMENT: Pt reports working hard with PT.   Pt accompanied by:  daughter  PAIN:  Are you having pain?  None Rating: 0/10 at rest now, up to 0/10 at worst in past week   FALLS: Has patient fallen in last 6 months? Yes. Number of falls "about 5 times"   LIVING ENVIRONMENT: Lives with: lives with their daughter Lives in: House/apartment Stairs: No Has following equipment at home: Environmental consultant - 2 wheeled, Tour manager, and adapted toilet  PLOF: Independent with basic ADLs, Independent with household mobility with device, and Independent with community mobility with device  PATIENT GOALS: to improve strength, motion, coordination in Rt side and with overall function, if possible    OBJECTIVE: (All objective assessments below are from initial evaluation on: 09/15/22 unless otherwise specified.)   HAND DOMINANCE: Right   ADLs: Overall ADLs: Daughter states she gives Mod A for dressing, has adapted toilet with rails and toilets himself, he sponge bathes with supervision, uses a walker at home and when out for mobility. He used to use the microwave, but hasn't after hospitalization. He now wets the bed more, which did not used to be so problematic.   FUNCTIONAL OUTCOME MEASURES: Eval: Quck DASH 25% impairment today  (Higher % Score  =  More Impairment)   UPPER EXTREMITY ROM     Shoulder to Wrist AROM Right eval Left eval  Shoulder flexion 90 130  Shoulder abduction 90 130  Elbow flexion 130 155  Elbow extension 0 0  Forearm supination WFL WFL  Forearm pronation  [facility]  Wrist flexion Tight but functional Tight but functional  Wrist extension Tight but functional Tight but functional  (Blank rows = not tested)   Hand AROM Right eval Left eval  Full Fist Ability (or Gap to Distal Palmar Crease) full full  Thumb Opposition  (Kapandji Scale)  6 6  (Blank rows = not tested)   UPPER EXTREMITY MMT:     MMT Right 09/15/22  Left 09/15/22  Shoulder flexion 4/5 5/5  Elbow flexion 4/5 4+/5  Elbow extension 4/5 4+/5  (Blank rows = not tested)  HAND FUNCTION: Eval: Observed weakness in affected hand.  Grip strength Right: 48 lbs, Left: 57 lbs   COORDINATION: Eval: Observed coordination impairments with affected hand. Box and Blocks Test: Rt: 20 blocks, Lt 21 blocks today   COGNITION: Eval: Overall cognitive status: He was quiet and seemingly dependent on his daughter to answer most questions for him. He seemed to have at least mild recall/memory issues as well, and small problems following simple commands at times.   OBSERVATIONS:   Eval: Overall he presents as somewhat more stiff, slower coordination, and weaker in dominant right upper extremity today.   TODAY'S TREATMENT:  - Therapeutic exercises completed for duration as noted below including: Pt given red theraband HEP as provided below: Exercises - Seated Shoulder Flexion with Self-Anchored Resistance  - 2 sets x 10 reps each - Seated Shoulder Horizontal Abduction with Resistance  - 2 sets x 10 reps each - Seated Elbow Flexion with Self-Anchored Resistance  - 2 sets x 10 reps each -Chair pushups - 2 sets x 8 reps each Patient  requiring moderate verbal, tactile, and visual cues for correct form and technique. Pt then performing seated PNF-type pattern to grasp weighted item (10lb kettlebell) from floor in front of foot, and bring towards opposite thigh to challenge balance when picking up items off floor similar to home environment. Pt performed 2 sets x 5 reps each side with verbal, visual and tactile cues. Pt denies dizziness or lightheadedness with task. Pt also stating the task is "just right" when it comes to how hard it is.  PATIENT EDUCATION:  Education details: Added to HEP Person educated: Patient and daughter Education method: Verbal Instruction, tactile cueing  Education comprehension: States and demonstrates understanding, Additional  Education required    HOME EXERCISE PROGRAM:  Access Code: [email or web address] URL: [email or web address]  Access Code: [email or web address] URL: [email or web address] Date: 10/29/2022 Prepared by: Wyn Forster Amisha Pospisil  GOALS: Goals reviewed with patient? Yes   SHORT TERM GOALS: (STG required if POC>30 days) Target Date: 10/01/22  Pt will demo/state understanding of initial HEP to improve pain levels and prerequisite motion. Goal status: 09/29/22: MET   LONG TERM GOALS: Target Date: 11/05/22  Pt will improve functional ability by decreased impairment per Quick DASH assessment from 25% to 15% or better, for better quality of life. Goal status: INITIAL  2.  Pt will improve grip strength in Rt hand hand from 48lbs to at least 55lbs for functional use at home and in IADLs. Goal status: INITIAL  3.  Pt will improve A/ROM in Rt sh flex and abd from 90* each to at least 120* each, to have functional motion for tasks like reach and grasp.  Goal status: INITIAL  4.  Pt will improve strength in Rt elbow from 4/5 MMT to at least 4+/5 MMT to have increased functional ability to carry out selfcare and higher-level tasks with less difficulty. Goal status: INITIAL  5.  Pt will improve coordination skills in b/l UEs, as seen by better score (at least 30 blocks in both UEs) on BBT testing to have increased functional ability to carry out fine motor tasks (fasteners, etc.) and more complex, coordinated IADLs as able.  Goal status: INITIAL   ASSESSMENT:  CLINICAL IMPRESSION: Pt demonstrates improved MMT this visit BUE. Pt engaging in BUE HEP to improve strength and ROM required for self-care and addressing of strengthening goals.   PERFORMANCE DEFICITS: in functional skills including ADLs, IADLs, coordination, dexterity, ROM, strength, flexibility, Fine motor control, mobility, balance, body mechanics, endurance, decreased knowledge of precautions, and UE functional use, cognitive skills  including attention, energy/drive, memory, problem solving, safety awareness, and thought, and psychosocial skills including coping strategies, environmental adaptation, habits, and routines and behaviors.    PLAN:  OT FREQUENCY: 2x/week  OT DURATION: 8 weeks  PLANNED INTERVENTIONS: self care/ADL training, therapeutic exercise, therapeutic activity, neuromuscular re-education, manual therapy, passive range of motion, functional mobility training, compression bandaging, moist heat, cryotherapy, contrast bath, patient/family education, cognitive remediation/compensation, visual/perceptual remediation/compensation, energy conservation, coping strategies training, and DME and/or AE instructions  CONSULTED AND AGREED WITH PLAN OF CARE: Patient and family member/caregiver  RECOMMENDED OTHER SERVICES: Speech therapy evaluation due to concern for oral motor control as pt observed to drool during back to back OT sessions and pt's daughter reports increase in drooling acutely.   PLAN FOR NEXT SESSION:  continue working on mobility, strength, function, etc.; go over [employer], OT 10/29/2022, 2:02 PM

## 2022-10-29 NOTE — Therapy (Signed)
OUTPATIENT PHYSICAL THERAPY NEURO TREATMENT    Patient Name: Carlos Shields Carlos [id number] DOB:1930/07/14, 89 y.o., male Today's Date: 10/29/2022   PCP: Norm Salt, PA REFERRING PROVIDER: Charlton Amor, PA-C     END OF SESSION:  PT End of Session - 10/29/22 1251     Visit Number 13    Number of Visits 17    Date for PT Re-Evaluation 11/04/22    Authorization Type UHC Medicare    Progress Note Due on Visit 10    PT Start Time 1240    PT Stop Time 1320    PT Time Calculation (min) 40 min    Equipment Utilized During Treatment Gait belt    Activity Tolerance Patient tolerated treatment well    Behavior During Therapy WFL for tasks assessed/performed                  Past Medical History:  Diagnosis Date   CKD (chronic kidney disease) stage 3, GFR 30-59 ml/min    Gait abnormality    Hypertension    Pancreatitis    Seizures    Venous insufficiency    Past Surgical History:  Procedure Laterality Date   HERNIA REPAIR     TRANSCAROTID ARTERY REVASCULARIZATION  Left 08/12/2022   Procedure: Transcarotid Artery Revascularization;  Surgeon: Maeola Harman, MD;  Location: [facility] OR;  Service: Vascular;  Laterality: Left;   Patient Active Problem List   Diagnosis Date Noted   Mild vascular dementia 10/18/2022   CVA (cerebrovascular accident) 08/14/2022   Acute-on-chronic kidney injury 08/05/2022   CVA (cerebral vascular accident) 08/05/2022   Dysphagia 08/05/2022   Abnormal TSH 08/05/2022   Acute metabolic encephalopathy 08/05/2022   CKD (chronic kidney disease), stage III 04/26/2021   Weakness generalized 04/26/2021   Weakness 04/25/2021   Syncope 01/13/2012   Anemia 12/09/2011   Seizures 12/08/2011   HTN (hypertension) 12/08/2011   Encephalopathy 12/08/2011    ONSET DATE: 08/25/2022  REFERRING DIAG: I63.09 (ICD-10-CM) - Cerebrovascular accident (CVA) due to thrombosis of other precerebral artery (HCC)  THERAPY DIAG:  Muscle weakness  (generalized)  Unsteadiness on feet  Cerebrovascular accident (CVA) due to thrombosis of other precerebral artery  Rationale for Evaluation and Treatment: Rehabilitation  SUBJECTIVE:                                                                                                                                                                                             SUBJECTIVE STATEMENT: Pt present with daughter. No new complaints or falls.  Pt accompanied by: self and daughter, Carlos Shields (in car outside)    PERTINENT HISTORY:  He underwent MRI of the brain 08/05/2022 which revealed multiple small acute infarcts involving the left basal ganglia, left frontal, parietal and occipital cortex and adjacent white matter with slight edema but no mass effect. He was admitted and underwent MRI of the head 08/06/2022 which showed intracranial atherosclerotic disease with associated moderate stenosis at the left carotid siphon with additional severe distal L P3 stenosis. Vascular surgery consulted he underwent CTA of the neck 08/08/2022 which showed proximal left ICA stenosis 75% and 65% on the right and subsequently underwent left transcarotid artery revascularization 08/12/2022 per Dr. Randie Heinz. PMH: Seizure maintained on Keppra, hypertension, CKD stage III, pancreatitis, venous insufficiency and dysphagia who was admitted 08/05/2022 for right-sided weakness, incontinence, seizure-like activity and fall.  PAIN:  Are you having pain? No  VITALS There were no vitals filed for this visit.     PRECAUTIONS: Fall and Other: seizure   PATIENT GOALS: per daughter, pt would like to work on walking   TODAY'S TREATMENT:     Sit to stand: 3x from high mat table, retro pulsion noted along with knee push backs against table for stability Sit to stand: 3x from high mat table with airex under feet: min A to reduce LOB from retropulsion, cues to push from mat table instead of having both hands on walker Standing  balance: 2 x 30 on floor, CGA to min A Standing on foam: 2 x 1 min long, pt required max A to prevent from falling due to significant retropulsion and pt uanbel to correct his LOB despite multiple verbal cueing Standing on foam (but with 1" foam wedge under the heels) so patient is standing on decline to transfer weight forward onto the forefoot: 2 x 30" - only required CGA and pt able to stand for 30 sec For the last attempt: took the 1" wedge from under the heels while pt still standing on airex foam: - pt still required max A but his retropulsion was slightly improved Placed 1" foam under heel (under airex pad but more under heels), pt standing while performing dyanmic activities:  band tied on front end of walker. Pt requires min-mod cues for proper AD management while turning to sit or reaching for objects.   - sit to stand: 5lb KB in hands, mod to max A for retro pulsion 2 x 10   - touching toes with bil UE: mod to max A             - Standing bil biceps flexion with 5lb KB: 10x  Gait training: 1 x 115' with RW, red band placed around back walker handles with posterior pull by PT to promote pt to stand in the middle of walker and increase step length.                                              PATIENT EDUCATION: Education details: continue HEP, floor recovery technique  Person educated: Patient and Child(ren) Education method: Explanation, Demonstration, Tactile cues, and Verbal cues Education comprehension: verbal cues required, tactile cues required, and needs further education  HOME EXERCISE PROGRAM: Access Code: [email or web address] URL: [email or web address] Date: 09/13/2022 Prepared by: Alethia Berthold Plaster  Exercises - Standing Quarter Turn with Counter Support  - 1 x daily - 7 x weekly - 3 sets - 10 reps - Sit to Stand with Armchair  - 1 x daily - 7  x weekly - 3 sets - 10 reps  GOALS: Goals reviewed with patient? Yes  SHORT TERM GOALS: Target date: 10/07/2022   Pt will  perform initial HEP w/mod A from daughter for improved balance, safety and strength Baseline: not established on eval  Goal status: MET  2.  Pt will improve gait velocity to at least 0.9 ft/s with LRAD and CGA for improved gait efficiency   Baseline: 0.7 ft/s with RW and CGA; 0.65 ft/s (3/28)  Goal status: NOT MET  3.  Pt will perform single sit <>stand in under 45s w/mod verbal cues for improved independence and safety at home.  Baseline: 63s w/max verbal cues for proper sequencing; 12.53s w/min cues  Goal status: MET  4.  Pt will improve normal TUG to less than or equal to 80 seconds w/LRAD and mod verbal cues for improved functional mobility and decreased fall risk.  Baseline: 89.34sw/RW and max verbal cues; 74.75s w/RW and CGA (3/28) Goal status: MET  LONG TERM GOALS: Target date: 11/04/2022   Pt will perform final HEP w/min A from daughter for improved strength, balance, transfers and gait.  Baseline:  Goal status: INITIAL  2.  Pt will improve normal TUG to less than or equal to 70 seconds w/LRAD and min verbal cues for improved functional mobility and decreased fall risk.  Baseline: 89.34sw/RW and max verbal cues; 74.75s w/RW and CGA (3/28) Goal status: REVISED  3.  Pt will improve 5 x STS to less than or equal to 35 seconds w/proper form to demonstrate improved functional strength and transfer efficiency.   Baseline: 38.03s w/heavy bracing against mat table  Goal status: REVISED  4.  Pt will improve gait velocity to at least 0.9 ft/s with LRAD and S* for improved gait efficiency and safety  Baseline: 0.7 ft/s with RW; 0.42ft/s (3/28) Goal status: REVISED    ASSESSMENT:  CLINICAL IMPRESSION: Pt continues to have significant retropulstion with sit to stand tranfsers. Pt demo significant impairments with proprioception while standing on the foam as he required max A to maintain standing balance and pt was not able to correct his posterior LOB. Patient also uses back  of his knees significantly with sit to stand and requires mod tactile cues to lean forward.  OBJECTIVE IMPAIRMENTS: Abnormal gait, decreased activity tolerance, decreased balance, decreased cognition, decreased coordination, decreased endurance, decreased knowledge of condition, decreased knowledge of use of DME, decreased mobility, difficulty walking, decreased strength, decreased safety awareness, impaired perceived functional ability, and improper body mechanics  ACTIVITY LIMITATIONS: carrying, lifting, sitting, standing, squatting, stairs, transfers, bathing, toileting, dressing, hygiene/grooming, and locomotion level  PARTICIPATION LIMITATIONS: meal prep, cleaning, laundry, medication management, personal finances, driving, shopping, community activity, and yard work  PERSONAL FACTORS: Age, Fitness, Past/current experiences, and 3+ comorbidities: basal ganglia CVA, HTN, seizures  are also affecting patient's functional outcome.   REHAB POTENTIAL: Fair due to severity of deficits and cognitive impairment  CLINICAL DECISION MAKING: Evolving/moderate complexity  EVALUATION COMPLEXITY: Moderate  PLAN:  PT FREQUENCY: 2x/week  PT DURATION: 8 weeks  PLANNED INTERVENTIONS: Therapeutic exercises, Therapeutic activity, Neuromuscular re-education, Balance training, Gait training, Patient/Family education, Self Care, Joint mobilization, Stair training, Vestibular training, Canalith repositioning, DME instructions, Aquatic Therapy, Manual therapy, and Re-evaluation  PLAN FOR NEXT SESSION:  Check BP. Practice floor transfer with pt and daughter. add to HEP- lateral and retro stepping. Work on sit <>stands, increased step length, endurance, LE coordination, anterior weight shifting. Alt stepping over beam -fwd and lat    Theone Murdoch  Celene Skeen, PT, DPT  10/29/2022, 12:55 PM

## 2022-11-02 ENCOUNTER — Ambulatory Visit: Payer: Medicare Other | Admitting: Occupational Therapy

## 2022-11-02 ENCOUNTER — Ambulatory Visit: Payer: Medicare Other | Admitting: Physical Therapy

## 2022-11-02 DIAGNOSIS — N2581 Secondary hyperparathyroidism of renal origin: Secondary | ICD-10-CM | POA: Diagnosis not present

## 2022-11-02 DIAGNOSIS — D631 Anemia in chronic kidney disease: Secondary | ICD-10-CM | POA: Diagnosis not present

## 2022-11-02 DIAGNOSIS — N390 Urinary tract infection, site not specified: Secondary | ICD-10-CM | POA: Diagnosis not present

## 2022-11-02 DIAGNOSIS — N189 Chronic kidney disease, unspecified: Secondary | ICD-10-CM | POA: Diagnosis not present

## 2022-11-02 DIAGNOSIS — N1832 Chronic kidney disease, stage 3b: Secondary | ICD-10-CM | POA: Diagnosis not present

## 2022-11-02 DIAGNOSIS — I129 Hypertensive chronic kidney disease with stage 1 through stage 4 chronic kidney disease, or unspecified chronic kidney disease: Secondary | ICD-10-CM | POA: Diagnosis not present

## 2022-11-03 ENCOUNTER — Ambulatory Visit: Payer: Medicare Other | Admitting: Physical Therapy

## 2022-11-03 ENCOUNTER — Telehealth: Payer: Self-pay | Admitting: Physical Therapy

## 2022-11-03 VITALS — BP 124/59 | HR 62

## 2022-11-03 DIAGNOSIS — R2689 Other abnormalities of gait and mobility: Secondary | ICD-10-CM | POA: Diagnosis not present

## 2022-11-03 DIAGNOSIS — M6281 Muscle weakness (generalized): Secondary | ICD-10-CM

## 2022-11-03 DIAGNOSIS — R293 Abnormal posture: Secondary | ICD-10-CM | POA: Diagnosis not present

## 2022-11-03 DIAGNOSIS — R2681 Unsteadiness on feet: Secondary | ICD-10-CM

## 2022-11-03 DIAGNOSIS — I6309 Cerebral infarction due to thrombosis of other precerebral artery: Secondary | ICD-10-CM | POA: Diagnosis not present

## 2022-11-03 DIAGNOSIS — R278 Other lack of coordination: Secondary | ICD-10-CM | POA: Diagnosis not present

## 2022-11-03 NOTE — Telephone Encounter (Signed)
Dr. Teresa Coombs,  Carlos Shields has been seen at [facility] for PT and OT since 09/09/22. The patient would benefit from home health OT and PT due to functional decline, reliance on caregiver for transportation and difficulty w/ADLs at home. Pt demonstrates poor carryover of education provided from outpatient rehab in the home due to impaired cognition and home health is the safest option for him and his caregiver at this time.     If you agree, please place an order in [facility] workque in [facility] or fax the order to [phone].  Thank you, Josephine Igo, PT, DPT [facility] [address] [place], [place]  [address] Phone:  [phone] Fax:  [phone]

## 2022-11-03 NOTE — Therapy (Signed)
OUTPATIENT PHYSICAL THERAPY NEURO TREATMENT - DISCHARGE SUMMARY   Patient Name: Carlos Shields MRN: [id number] DOB:October 02, 1930, 89 y.o., male Today's Date: 11/03/2022   PCP: Norm Salt, PA REFERRING PROVIDER: Charlton Amor, PA-C  PHYSICAL THERAPY DISCHARGE SUMMARY  Visits from Start of Care: 14  Current functional level related to goals / functional outcomes: Pt requires 24/7 S* for safety and use of AD at all times    Remaining deficits: R hemibody weakness, decreased safety awareness, high fall risk    Education / Equipment: HEP, plan for [facility]   Patient agrees to discharge. Patient goals were  discontinued . Patient is being discharged due to maximized rehab potential in outpatient.     END OF SESSION:  PT End of Session - 11/03/22 1411     Visit Number 14    Number of Visits 17    Date for PT Re-Evaluation 11/04/22    Authorization Type [employer] Medicare    Progress Note Due on Visit 10    PT Start Time 1404   Pt arrived late   PT Stop Time 1447    PT Time Calculation (min) 43 min    Equipment Utilized During Treatment Gait belt    Activity Tolerance Patient limited by lethargy    Behavior During Therapy WFL for tasks assessed/performed                  Past Medical History:  Diagnosis Date   CKD (chronic kidney disease) stage 3, GFR 30-59 ml/min    Gait abnormality    Hypertension    Pancreatitis    Seizures    Venous insufficiency    Past Surgical History:  Procedure Laterality Date   HERNIA REPAIR     TRANSCAROTID ARTERY REVASCULARIZATION  Left 08/12/2022   Procedure: Transcarotid Artery Revascularization;  Surgeon: Maeola Harman, MD;  Location: [facility] OR;  Service: Vascular;  Laterality: Left;   Patient Active Problem List   Diagnosis Date Noted   Mild vascular dementia 10/18/2022   CVA (cerebrovascular accident) 08/14/2022   Acute-on-chronic kidney injury 08/05/2022   CVA (cerebral vascular accident) 08/05/2022   Dysphagia  08/05/2022   Abnormal TSH 08/05/2022   Acute metabolic encephalopathy 08/05/2022   CKD (chronic kidney disease), stage III 04/26/2021   Weakness generalized 04/26/2021   Weakness 04/25/2021   Syncope 01/13/2012   Anemia 12/09/2011   Seizures 12/08/2011   HTN (hypertension) 12/08/2011   Encephalopathy 12/08/2011    ONSET DATE: 08/25/2022  REFERRING DIAG: I63.09 (ICD-10-CM) - Cerebrovascular accident (CVA) due to thrombosis of other precerebral artery (HCC)  THERAPY DIAG:  Muscle weakness (generalized)  Unsteadiness on feet  Other abnormalities of gait and mobility  Rationale for Evaluation and Treatment: Rehabilitation  SUBJECTIVE:  SUBJECTIVE STATEMENT: Pt ambulated into clinic dragging his RLE. Daughter reports this started yesterday, is worse today. Pt denies pain or any recent changes.   Pt accompanied by: self and daughter, Carlos Shields (in car outside)    PERTINENT HISTORY: He underwent MRI of the brain 08/05/2022 which revealed multiple small acute infarcts involving the left basal ganglia, left frontal, parietal and occipital cortex and adjacent white matter with slight edema but no mass effect. He was admitted and underwent MRI of the head 08/06/2022 which showed intracranial atherosclerotic disease with associated moderate stenosis at the left carotid siphon with additional severe distal L P3 stenosis. Vascular surgery consulted he underwent CTA of the neck 08/08/2022 which showed proximal left ICA stenosis 75% and 65% on the right and subsequently underwent left transcarotid artery revascularization 08/12/2022 per Dr. Randie Heinz. PMH: Seizure maintained on Keppra, hypertension, CKD stage III, pancreatitis, venous insufficiency and dysphagia who was admitted 08/05/2022 for right-sided weakness, incontinence,  seizure-like activity and fall.  PAIN:  Are you having pain? No  VITALS Vitals:   11/03/22 1428  BP: (!) 124/59  Pulse: 62       PRECAUTIONS: Fall and Other: seizure   PATIENT GOALS: per daughter, pt would like to work on walking   TODAY'S TREATMENT:   Ther Act  Discussed POC moving forward and due to change in medical status/cognitive impairment, PT and OT in agreement that pt would benefit from [facility] services at this time. Pt demonstrates absent carryover of education provided in clinic at home and is having difficulty performing ADLs at home and is unsafe w/RW without S*. Daughter in agreement to transition at this time. Goals discontinued due to change in medical status (pt appeared w/Jaundice and dragging R foot). Pt saw MD yesterday, but encouraged daughter to take pt to ED if he continues to decline. Vitals stable.       PATIENT EDUCATION: Education details: Plan to transition to [facility], [facility], [facility]  Person educated: Patient and Child(ren) Education method: Explanation, Demonstration, Tactile cues, and Verbal cues Education comprehension: verbalized understanding  HOME EXERCISE PROGRAM: Access Code: [email or web address] URL: [email or web address] Date: 09/13/2022 Prepared by: Alethia Berthold Derrius Furtick  Exercises - Standing Quarter Turn with Counter Support  - 1 x daily - 7 x weekly - 3 sets - 10 reps - Sit to Stand with Armchair  - 1 x daily - 7 x weekly - 3 sets - 10 reps  GOALS: Goals reviewed with patient? Yes  SHORT TERM GOALS: Target date: 10/07/2022   Pt will perform initial HEP w/mod A from daughter for improved balance, safety and strength Baseline: not established on eval  Goal status: MET  2.  Pt will improve gait velocity to at least 0.9 ft/s with LRAD and CGA for improved gait efficiency   Baseline: 0.7 ft/s with RW and CGA; 0.65 ft/s (3/28)  Goal status: NOT MET  3.  Pt will perform single sit <>stand in under 45s w/mod verbal cues for improved independence and safety at  home.  Baseline: 63s w/max verbal cues for proper sequencing; 12.53s w/min cues  Goal status: MET  4.  Pt will improve normal TUG to less than or equal to 80 seconds w/LRAD and mod verbal cues for improved functional mobility and decreased fall risk.  Baseline: 89.34sw/RW and max verbal cues; 74.75s w/RW and CGA (3/28) Goal status: MET  LONG TERM GOALS: Target date: 11/04/2022 - DISCONTINUED    Pt will perform final HEP w/min A from daughter for improved strength, balance, transfers and gait.  Baseline:  Goal status: INITIAL  2.  Pt will improve normal TUG to less than or equal to 70 seconds w/LRAD and min verbal cues for improved functional mobility and decreased fall risk.  Baseline: 89.34sw/RW and max verbal cues; 74.75s w/RW and CGA (3/28) Goal status: REVISED  3.  Pt will improve 5 x STS to less than or equal to 35 seconds w/proper form to demonstrate improved functional strength and transfer efficiency.   Baseline: 38.03s w/heavy bracing against mat table  Goal status: REVISED  4.  Pt will improve gait velocity to at least 0.9 ft/s with LRAD and S* for improved gait efficiency and safety  Baseline: 0.7 ft/s with RW; 0.60ft/s (3/28) Goal status: REVISED    ASSESSMENT:  CLINICAL IMPRESSION: Emphasis of skilled PT session on DC from PT. At this time, pt more appropriate for home health therapy due to decline in medical function, difficulty w/ADLs at home, no carryover of outpatient education to home and reliance on others for transportation. Pt very lethargic today and dragging his RLE w/gait. Daughter reports pt saw a doctor yesterday, but therapist unable to see their documentation. Encouraged daughter to take pt to hospital if pt's function continues to decline, daughter verbalized understanding.    OBJECTIVE IMPAIRMENTS: Abnormal gait, decreased activity tolerance, decreased balance, decreased cognition, decreased coordination, decreased endurance, decreased knowledge of  condition, decreased knowledge of use of DME, decreased mobility, difficulty walking, decreased strength, decreased safety awareness, impaired perceived functional ability, and improper body mechanics  ACTIVITY LIMITATIONS: carrying, lifting, sitting, standing, squatting, stairs, transfers, bathing, toileting, dressing, hygiene/grooming, and locomotion level  PARTICIPATION LIMITATIONS: meal prep, cleaning, laundry, medication management, personal finances, driving, shopping, community activity, and yard work  PERSONAL FACTORS: Age, Fitness, Past/current experiences, and 3+ comorbidities: basal ganglia CVA, HTN, seizures  are also affecting patient's functional outcome.   REHAB POTENTIAL: Fair due to severity of deficits and cognitive impairment  CLINICAL DECISION MAKING: Evolving/moderate complexity  EVALUATION COMPLEXITY: Moderate  PLAN:  PT FREQUENCY: 2x/week  PT DURATION: 8 weeks  PLANNED INTERVENTIONS: Therapeutic exercises, Therapeutic activity, Neuromuscular re-education, Balance training, Gait training, Patient/Family education, Self Care, Joint mobilization, Stair training, Vestibular training, Canalith repositioning, DME instructions, Aquatic Therapy, Manual therapy, and Re-evaluation    Andrian Urbach E Abbagail Scaff, PT, DPT  11/03/2022, 2:53 PM

## 2022-11-04 ENCOUNTER — Other Ambulatory Visit: Payer: Self-pay | Admitting: Neurology

## 2022-11-04 DIAGNOSIS — R269 Unspecified abnormalities of gait and mobility: Secondary | ICD-10-CM

## 2022-11-04 DIAGNOSIS — I639 Cerebral infarction, unspecified: Secondary | ICD-10-CM

## 2022-11-04 DIAGNOSIS — F03A0 Unspecified dementia, mild, without behavioral disturbance, psychotic disturbance, mood disturbance, and anxiety: Secondary | ICD-10-CM

## 2022-11-04 NOTE — Telephone Encounter (Signed)
Order in EPIC. Thanks

## 2022-11-05 ENCOUNTER — Ambulatory Visit: Payer: Medicare Other | Admitting: Occupational Therapy

## 2022-11-05 ENCOUNTER — Ambulatory Visit: Payer: Medicare Other | Admitting: Physical Therapy

## 2022-11-08 ENCOUNTER — Ambulatory Visit: Payer: Medicare Other

## 2022-11-12 ENCOUNTER — Emergency Department (HOSPITAL_COMMUNITY): Payer: Medicare Other

## 2022-11-12 ENCOUNTER — Other Ambulatory Visit: Payer: Self-pay

## 2022-11-12 ENCOUNTER — Emergency Department (HOSPITAL_COMMUNITY)
Admission: EM | Admit: 2022-11-12 | Discharge: 2022-11-12 | Disposition: A | Discharge status: Home / Self Care | Payer: Medicare Other | Attending: Emergency Medicine | Admitting: Emergency Medicine

## 2022-11-12 ENCOUNTER — Encounter (HOSPITAL_COMMUNITY): Payer: Self-pay

## 2022-11-12 DIAGNOSIS — S300XXA Contusion of lower back and pelvis, initial encounter: Secondary | ICD-10-CM | POA: Diagnosis not present

## 2022-11-12 DIAGNOSIS — I129 Hypertensive chronic kidney disease with stage 1 through stage 4 chronic kidney disease, or unspecified chronic kidney disease: Secondary | ICD-10-CM | POA: Diagnosis not present

## 2022-11-12 DIAGNOSIS — I10 Essential (primary) hypertension: Secondary | ICD-10-CM | POA: Diagnosis not present

## 2022-11-12 DIAGNOSIS — W06XXXA Fall from bed, initial encounter: Secondary | ICD-10-CM | POA: Insufficient documentation

## 2022-11-12 DIAGNOSIS — S3992XA Unspecified injury of lower back, initial encounter: Secondary | ICD-10-CM | POA: Diagnosis present

## 2022-11-12 DIAGNOSIS — S0990XA Unspecified injury of head, initial encounter: Secondary | ICD-10-CM | POA: Diagnosis not present

## 2022-11-12 DIAGNOSIS — F0390 Unspecified dementia without behavioral disturbance: Secondary | ICD-10-CM | POA: Diagnosis not present

## 2022-11-12 DIAGNOSIS — Z79899 Other long term (current) drug therapy: Secondary | ICD-10-CM | POA: Insufficient documentation

## 2022-11-12 DIAGNOSIS — Z043 Encounter for examination and observation following other accident: Secondary | ICD-10-CM | POA: Diagnosis not present

## 2022-11-12 DIAGNOSIS — N183 Chronic kidney disease, stage 3 unspecified: Secondary | ICD-10-CM | POA: Diagnosis not present

## 2022-11-12 DIAGNOSIS — W19XXXA Unspecified fall, initial encounter: Secondary | ICD-10-CM

## 2022-11-12 LAB — BASIC METABOLIC PANEL
Anion gap: 7 (ref 5–15)
BUN: 34 mg/dL — ABNORMAL HIGH (ref 8–23)
CO2: 21 mmol/L — ABNORMAL LOW (ref 22–32)
Calcium: 8.8 mg/dL — ABNORMAL LOW (ref 8.9–10.3)
Chloride: 110 mmol/L (ref 98–111)
Creatinine, Ser: 2.51 mg/dL — ABNORMAL HIGH (ref 0.61–1.24)
GFR, Estimated: 24 mL/min — ABNORMAL LOW (ref >60)
Glucose, Bld: 87 mg/dL (ref 70–99)
Potassium: 5.2 mmol/L — ABNORMAL HIGH (ref 3.5–5.1)
Sodium: 138 mmol/L (ref 135–145)

## 2022-11-12 LAB — CBC WITH DIFFERENTIAL/PLATELET
Abs Immature Granulocytes: 0.01 10*3/uL (ref 0.00–0.07)
Basophils Absolute: 0 10*3/uL (ref 0.0–0.1)
Basophils Relative: 1 %
Eosinophils Absolute: 0.2 10*3/uL (ref 0.0–0.5)
Eosinophils Relative: 3 %
HCT: 27.6 % — ABNORMAL LOW (ref 39.0–52.0)
Hemoglobin: 8.7 g/dL — ABNORMAL LOW (ref 13.0–17.0)
Immature Granulocytes: 0 %
Lymphocytes Relative: 41 %
Lymphs Abs: 2.5 10*3/uL (ref 0.7–4.0)
MCH: 28.8 pg (ref 26.0–34.0)
MCHC: 31.5 g/dL (ref 30.0–36.0)
MCV: 91.4 fL (ref 80.0–100.0)
Monocytes Absolute: 0.6 10*3/uL (ref 0.1–1.0)
Monocytes Relative: 11 %
Neutro Abs: 2.7 10*3/uL (ref 1.7–7.7)
Neutrophils Relative %: 44 %
Platelets: 141 10*3/uL — ABNORMAL LOW (ref 150–400)
RBC: 3.02 MIL/uL — ABNORMAL LOW (ref 4.22–5.81)
RDW: 12.9 % (ref 11.5–15.5)
WBC: 6.1 10*3/uL (ref 4.0–10.5)
nRBC: 0 % (ref 0.0–0.2)

## 2022-11-12 LAB — URINALYSIS, ROUTINE W REFLEX MICROSCOPIC
Bilirubin Urine: NEGATIVE
Glucose, UA: NEGATIVE mg/dL
Hgb urine dipstick: NEGATIVE
Ketones, ur: NEGATIVE mg/dL
Leukocytes,Ua: NEGATIVE
Nitrite: NEGATIVE
Protein, ur: NEGATIVE mg/dL
Specific Gravity, Urine: 1.012 (ref 1.005–1.030)
pH: 7 (ref 5.0–8.0)

## 2022-11-12 MED ORDER — SODIUM CHLORIDE 0.9 % IV BOLUS
1000.0000 mL | Freq: Once | INTRAVENOUS | Status: AC
  Administered 2022-11-12: 1000 mL via INTRAVENOUS

## 2022-11-12 NOTE — Discharge Instructions (Signed)
You are seen in the emergency department for a fall.  Thankfully your labs and imaging were all reassuring at this time.  He was urged showing some signs of dehydration based on your kidney function and you were given IV fluids to address this.  Please ensure that you are staying adequately hydrated at home but do not drink water excessively.  If any symptoms are worsening, please return to the emergency department for further evaluation.

## 2022-11-12 NOTE — ED Provider Notes (Signed)
Patient was a handoff from [place], [place].  Plan is to wait for lab results before disposition patient. Physical Exam  BP (!) 148/78   Pulse 68   Temp 97.6 F (36.4 C) (Oral)   Resp 18   Wt 54 kg   SpO2 98%   BMI 20.43 kg/m   Physical Exam  Procedures  Procedures  ED Course / MDM   Clinical Course as of 11/12/22 2000  Fri Nov 12, 2022  1443 This is an 89 year old gentleman presenting from home with concern for an unwitnessed fall.  Patient has a history of dementia.  Patient was noted to have some questionable bruising to the low back, although he does not have focal tenderness when I palpate the midline spine.  CT imaging does not show any acute fractures. Pending basic lab evaluation and UA for family concerns for some behavior change [MT]    Clinical Course User Index [MT] Trifan, Kermit Balo, MD   Medical Decision Making Amount and/or Complexity of Data Reviewed Labs: ordered. Radiology: ordered.   Patient was a handoff from [place], [place].  Please see their note for full HPI and physical exam findings.  Plan is to wait for lab results before disposition patient.  Labs largely at baseline but patient does appear to have evidence of a slight AKI baseline elevation in BUN, creatinine and declining GFR.  Patient does have a known history of stage III chronic kidney disease and is likely dry.  Patient was given IV fluids for rehydration to address this decline in kidney function.  Imaging was all reassuring.  Advised family of the findings and that patient is currently stable for discharge.  Patient's family is agreeable to treatment plan verbalized understanding all return precautions.       Smitty Knudsen, PA-C 11/12/22 2000    Loetta Rough, MD 11/20/22 [phone]

## 2022-11-12 NOTE — ED Triage Notes (Signed)
Bruise to lower back and decreased mobility after unwitnessed fall yesterday  Unk LOC.  Per family on Plavix  Hx dementia, seizure, and recent stroke.

## 2022-11-12 NOTE — ED Provider Notes (Signed)
[place] EMERGENCY DEPARTMENT AT [facility] Provider Note   CSN: [id number] Arrival date & time: 11/12/22  1120     History {Add pertinent medical, surgical, social history, OB history to HPI:1} Chief Complaint  Patient presents with   Kendel Kross is a 89 y.o. male.  Patient presents to the emergency department with concerns over decreased mobility and a bruise to the lower back secondary to an unwitnessed fall that occurred yesterday. Family is not currently with patient. Patient denies any pain but does endorse falling from his bed to the floor yesterday. He denies hitting his head, denies back pain, denies other pain. Patient with PMH significant for gait abnormality and right-sided weakness secondary to CVA, vascular dementia, discharged after nearly 11 hours observation CKD stage III, hypertension, seizures  HPI     Home Medications Prior to Admission medications   Medication Sig Start Date End Date Taking? Authorizing Provider  acetaminophen (TYLENOL) 325 MG tablet Take 1-2 tablets (325-650 mg total) by mouth every 4 (four) hours as needed for mild pain. 08/26/22   Angiulli, Mcarthur Rossetti, PA-C  amLODipine (NORVASC) 5 MG tablet Take 1 tablet (5 mg total) by mouth daily. 08/26/22   Angiulli, Mcarthur Rossetti, PA-C  clopidogrel (PLAVIX) 75 MG tablet Take 1 tablet (75 mg total) by mouth daily. 09/08/22   Jones Bales, NP  COMIRNATY SUSP injection  04/28/22   [provider]  levETIRAcetam (KEPPRA) 750 MG tablet Take 1 tablet (750 mg total) by mouth 2 (two) times daily. 09/20/22 10/20/22  Elayne Snare K, DO  memantine (NAMENDA) 10 MG tablet Take 1 tablet (10 mg total) by mouth 2 (two) times daily. 09/30/22 09/25/23  Windell Norfolk, MD  metoprolol tartrate (LOPRESSOR) 25 MG tablet Take 0.5 tablets (12.5 mg total) by mouth 2 (two) times daily. 08/26/22   Angiulli, Mcarthur Rossetti, PA-C  pantoprazole (PROTONIX) 40 MG tablet Take 1 tablet (40 mg total) by mouth daily. 08/26/22    Angiulli, Mcarthur Rossetti, PA-C  polyethylene glycol (MIRALAX / GLYCOLAX) 17 g packet Take 17 g by mouth daily. 08/26/22   Angiulli, Mcarthur Rossetti, PA-C  rosuvastatin (CRESTOR) 10 MG tablet Take 1 tablet (10 mg total) by mouth daily. 08/26/22   Angiulli, Mcarthur Rossetti, PA-C      Allergies    Patient has no known allergies.    Review of Systems   Review of Systems  Physical Exam Updated Vital Signs BP (!) 150/103 (BP Location: Right Arm)   Pulse 100   Temp 97.8 F (36.6 C) (Oral)   Resp 17   Wt 54 kg   SpO2 98%   BMI 20.43 kg/m  Physical Exam Vitals and nursing note reviewed.  Constitutional:      General: He is not in acute distress.    Appearance: He is well-developed.  HENT:     Head: Normocephalic and atraumatic.  Eyes:     Conjunctiva/sclera: Conjunctivae normal.  Cardiovascular:     Rate and Rhythm: Normal rate and regular rhythm.     Heart sounds: No murmur heard. Pulmonary:     Effort: Pulmonary effort is normal. No respiratory distress.     Breath sounds: Normal breath sounds.  Abdominal:     Palpations: Abdomen is soft.     Tenderness: There is no abdominal tenderness.  Musculoskeletal:        General: No swelling or tenderness.     Cervical back: Neck supple.     Comments: No tenderness  to palpation of the midline spine from cervical spine through lumbar  Skin:    General: Skin is warm and dry.     Capillary Refill: Capillary refill takes less than 2 seconds.     Findings: Bruising (Small bruise noted at lower lumbar region, midline) present.  Neurological:     Mental Status: He is alert.  Psychiatric:        Mood and Affect: Mood normal.     ED Results / Procedures / Treatments   Labs (all labs ordered are listed, but only abnormal results are displayed) Labs Reviewed  BASIC METABOLIC PANEL  CBC WITH DIFFERENTIAL/PLATELET  URINALYSIS, ROUTINE W REFLEX MICROSCOPIC    EKG None  Radiology CT Lumbar Spine Wo Contrast  Result Date: 11/12/2022 CLINICAL DATA:   Unwitnessed fall. EXAM: CT LUMBAR SPINE WITHOUT CONTRAST TECHNIQUE: Multidetector CT imaging of the lumbar spine was performed without intravenous contrast administration. Multiplanar CT image reconstructions were also generated. RADIATION DOSE REDUCTION: This exam was performed according to the departmental dose-optimization program which includes automated exposure control, adjustment of the mA and/or kV according to patient size and/or use of iterative reconstruction technique. COMPARISON:  Same-day lumbar spine radiographs, CT abdomen 07/02/2022 FINDINGS: Segmentation: Standard; the lowest formed disc space is designated L5-S1. Alignment: Normal. Vertebrae: The bones are diffusely demineralized. Mild compression deformity of the superior T12 endplate is unchanged compared to the CT abdomen from 2023. Vertebral body heights are otherwise preserved, without evidence of new acute fracture. There is no suspicious osseous lesion. Paraspinal and other soft tissues: Gallstones are noted. The paraspinal soft tissues are unremarkable. Disc levels: The disc heights are overall preserved. There is moderate facet arthropathy on the right at L2-L3 and otherwise overall mild facet arthropathy in the lumbar spine. There are mild disc bulges at L2-L3 through L5-S1 without evidence of high-grade spinal canal or neural foraminal stenosis. IMPRESSION: 1. No acute fracture or traumatic malalignment of the lumbar spine. 2. Mild compression deformity of the superior T12 endplate is unchanged compared to the CT abdomen from 2023. 3. Diffuse bony demineralization and overall mild degenerative changes as above. Electronically Signed   By: Lesia Hausen M.D.   On: 11/12/2022 13:19   CT Head Wo Contrast  Result Date: 11/12/2022 CLINICAL DATA:  Unwitnessed fall EXAM: CT HEAD WITHOUT CONTRAST TECHNIQUE: Contiguous axial images were obtained from the base of the skull through the vertex without intravenous contrast. RADIATION DOSE  REDUCTION: This exam was performed according to the departmental dose-optimization program which includes automated exposure control, adjustment of the mA and/or kV according to patient size and/or use of iterative reconstruction technique. COMPARISON:  Brain MRI and head CT 08/05/2022 FINDINGS: Brain: There is no acute intracranial hemorrhage, extra-axial fluid collection, or acute infarct. There is unchanged parenchymal volume loss with prominence of the ventricular system and extra-axial CSF spaces. The ventricles are stable in size. A small area of encephalomalacia in the left parietal lobe and remote infarcts in the bilateral basal ganglia cerebellar hemispheres, and thalami are unchanged. Additional hypodensity in the supratentorial white matter likely reflecting sequela of underlying chronic small-vessel ischemic change is stable. The pituitary and suprasellar region are normal. There is no mass lesion. There is no mass effect or midline shift. Vascular: There is calcification of the bilateral carotid siphons. Skull: Normal. Negative for fracture or focal lesion. Sinuses/Orbits: The paranasal sinuses are clear. The globes and orbits are unremarkable. Other: None. IMPRESSION: No acute intracranial pathology. Electronically Signed   By: Lesia Hausen  M.D.   On: 11/12/2022 13:12   DG Lumbar Spine Complete  Result Date: 11/12/2022 CLINICAL DATA:  Fall EXAM: LUMBAR SPINE - COMPLETE 4+ VIEW COMPARISON:  CT abdomen 07/02/2022 FINDINGS: There are 5 non-rib-bearing lumbar-type vertebral bodies. Mild compression deformity of the superior T12 endplate is unchanged since the CT abdomen from 2023. The other vertebral body heights are preserved, without evidence of acute injury. Alignment is normal. There is no evidence of spondylolysis. The disc heights are overall preserved. There is overall mild facet arthropathy in the lumbar spine, most advanced at L2-L3. The SI joints and symphysis pubis are intact. The soft  tissues are unremarkable. IMPRESSION: No evidence of acute injury in the lumbar spine. Electronically Signed   By: Lesia Hausen M.D.   On: 11/12/2022 12:58    Procedures Procedures  {Document cardiac monitor, telemetry assessment procedure when appropriate:1}  Medications Ordered in ED Medications - No data to display  ED Course/ Medical Decision Making/ A&P   {   Click here for ABCD2, HEART and other calculatorsREFRESH Note before signing :1}                          Medical Decision Making Amount and/or Complexity of Data Reviewed Labs: ordered. Radiology: ordered.   This patient presents to the ED for concern of injuries from a fall, this involves an extensive number of treatment options, and is a complaint that carries with it a high risk of complications and morbidity.  The differential diagnosis includes intracranial abnormality, fracture, dislocation, soft tissue injuries, others   Co morbidities that complicate the patient evaluation  History of CVA with right-sided weakness, seizures, vascular dementia   Additional history obtained:  Additional history obtained from family External records from outside source obtained and reviewed including neurology notes from 09/30/22   Lab Tests:  I Ordered, and personally interpreted labs.  The pertinent results include:  ***   Imaging Studies ordered:  I ordered imaging studies including ct head, ct lumbar spine  I independently visualized and interpreted imaging which showed No acute intracranial pathology.  1. No acute fracture or traumatic malalignment of the lumbar spine.  2. Mild compression deformity of the superior T12 endplate is  unchanged compared to the CT abdomen from 2023.   I agree with the radiologist interpretation   Cardiac Monitoring: / EKG:  The patient was maintained on a cardiac monitor.  I personally viewed and interpreted the cardiac monitored which showed an underlying rhythm of: sinus  rhythm   Consultations Obtained:  I requested consultation with the ***,  and discussed lab and imaging findings as well as pertinent plan - they recommend: ***   Problem List / ED Course / Critical interventions / Medication management  *** I ordered medication including ***  for ***  Reevaluation of the patient after these medicines showed that the patient {resolved/improved/worsened:23923::"improved"} I have reviewed the patients home medicines and have made adjustments as needed   Social Determinants of Health:  ***   Test / Admission - Considered:  ***   {Document critical care time when appropriate:1} {Document review of labs and clinical decision tools ie heart score, Chads2Vasc2 etc:1}  {Document your independent review of radiology images, and any outside records:1} {Document your discussion with family members, caretakers, and with consultants:1} {Document social determinants of health affecting pt's care:1} {Document your decision making why or why not admission, treatments were needed:1} Final Clinical Impression(s) / ED Diagnoses Final diagnoses:  None    Rx / DC Orders ED Discharge Orders     None

## 2022-11-12 NOTE — ED Provider Triage Note (Signed)
Emergency Medicine Provider Triage Evaluation Note  Carlos Shields , a 89 y.o. male  was evaluated in triage.  Pt complains of fall.  Patient's history conflicts with history presented by patient's spouse.  Patient's wife reports concern the patient is slightly altered.  Patient reportedly had an unwitnessed fall yesterday and has some decreased mobility since this fall.  Unsure if there is any loss of consciousness.  Patient is reportedly on Plavix.  Patient has a history of dementia, seizures, and a recent stroke.  Status reports the patient has a bruise to his lower back.  Patient denies any concerns of pain anywhere and feels that his range of motion is normal and at baseline..  Review of Systems  Positive: As above Negative: As above  Physical Exam  BP (!) 150/103 (BP Location: Right Arm)   Pulse 100   Temp 97.8 F (36.6 C) (Oral)   Resp 17   Wt 54 kg   SpO2 98%   BMI 20.43 kg/m  Gen:   Awake, no distress   Resp:  Normal effort  MSK:   Moves extremities without difficulty.  No point tenderness on palpation of spine Other:    Medical Decision Making  Medically screening exam initiated at 12:25 PM.  Appropriate orders placed.  [name] was informed that the remainder of the evaluation will be completed by another provider, this initial triage assessment does not replace that evaluation, and the importance of remaining in the ED until their evaluation is complete.     Smitty Knudsen, PA-C 11/12/22 1226

## 2022-11-12 NOTE — ED Notes (Signed)
Patient transported to X-ray 

## 2022-11-16 ENCOUNTER — Ambulatory Visit: Payer: Medicare Other

## 2022-11-17 DIAGNOSIS — E782 Mixed hyperlipidemia: Secondary | ICD-10-CM | POA: Diagnosis not present

## 2022-11-17 DIAGNOSIS — I6389 Other cerebral infarction: Secondary | ICD-10-CM | POA: Diagnosis not present

## 2022-11-17 DIAGNOSIS — N1832 Chronic kidney disease, stage 3b: Secondary | ICD-10-CM | POA: Diagnosis not present

## 2022-11-17 DIAGNOSIS — I872 Venous insufficiency (chronic) (peripheral): Secondary | ICD-10-CM | POA: Diagnosis not present

## 2022-11-17 DIAGNOSIS — R269 Unspecified abnormalities of gait and mobility: Secondary | ICD-10-CM | POA: Diagnosis not present

## 2022-11-17 DIAGNOSIS — I10 Essential (primary) hypertension: Secondary | ICD-10-CM | POA: Diagnosis not present

## 2022-11-17 DIAGNOSIS — R1319 Other dysphagia: Secondary | ICD-10-CM | POA: Diagnosis not present

## 2022-11-17 DIAGNOSIS — K219 Gastro-esophageal reflux disease without esophagitis: Secondary | ICD-10-CM | POA: Diagnosis not present

## 2022-11-24 ENCOUNTER — Ambulatory Visit: Payer: Medicare Other | Admitting: Physical Therapy

## 2022-12-01 DIAGNOSIS — R269 Unspecified abnormalities of gait and mobility: Secondary | ICD-10-CM | POA: Diagnosis not present

## 2022-12-01 DIAGNOSIS — K219 Gastro-esophageal reflux disease without esophagitis: Secondary | ICD-10-CM | POA: Diagnosis not present

## 2022-12-01 DIAGNOSIS — E782 Mixed hyperlipidemia: Secondary | ICD-10-CM | POA: Diagnosis not present

## 2022-12-01 DIAGNOSIS — I10 Essential (primary) hypertension: Secondary | ICD-10-CM | POA: Diagnosis not present

## 2022-12-01 DIAGNOSIS — I6389 Other cerebral infarction: Secondary | ICD-10-CM | POA: Diagnosis not present

## 2022-12-01 DIAGNOSIS — I872 Venous insufficiency (chronic) (peripheral): Secondary | ICD-10-CM | POA: Diagnosis not present

## 2022-12-01 DIAGNOSIS — N1832 Chronic kidney disease, stage 3b: Secondary | ICD-10-CM | POA: Diagnosis not present

## 2022-12-01 DIAGNOSIS — R1319 Other dysphagia: Secondary | ICD-10-CM | POA: Diagnosis not present

## 2022-12-13 ENCOUNTER — Other Ambulatory Visit: Payer: Self-pay | Admitting: Neurology

## 2022-12-13 ENCOUNTER — Telehealth: Payer: Self-pay

## 2022-12-13 MED ORDER — LEVETIRACETAM 500 MG PO TABS: 500.0000 mg | ORAL_TABLET | Freq: Two times a day (BID) | ORAL | 6 refills | Status: DC

## 2022-12-13 NOTE — Telephone Encounter (Signed)
Lets keep her in October unless they call for worsening symptoms. Thanks I believe once we decrease the Keppra, patient will feel better

## 2022-12-13 NOTE — Telephone Encounter (Signed)
Call back to daughter, advised that appt was scheduled 04/20/23 at 2:45pm for follow up and possible labs. Advised that the weakness and falls could be due to high keppra level and medication dose decrease could help symptoms improve. Advise to call if new symptoms or worsening symptoms.  Daughter verbalized understanding and appreciative of call.

## 2022-12-13 NOTE — Telephone Encounter (Signed)
Weakness and fall might be due to high level of Keppra. You can reschedule him in 6 weeks.

## 2022-12-13 NOTE — Telephone Encounter (Signed)
I don't need to see him urgently unless he is having breakthrough seizures. Since his level is still elevated, we will recommend patient to reduce the Keppra to 500 mg BID. I will send new Rx.

## 2022-12-13 NOTE — Telephone Encounter (Signed)
Call to daughter, advised that keppra levels were elevated and Dr. Teresa Coombs has sent new prescription in. Daughter verbalized understanding. Daughter states she is unaware of any seizure activity but patient is having weakness and falls which were present when he had his last stroke. and she denies new stroke symptoms and verbalized understanding to call 911 if stroke symptoms are present. Advised will follow up with [facility] if urgent appointment is needed. Daughter appreciative of call.

## 2022-12-13 NOTE — Telephone Encounter (Signed)
We received an urgent referral for this patient of yours for seizure management. The patients primary care provider who referred him to [place] was unaware that he has already seen [place] for his seizures and advised [place] to disregard the referral. They did do an updated Levetiracetam Lvl on the patient on 5/9. His level was 77.9   I saw when he was seen in our office, his levels were elevated at 73.8. I didn't know if you wanted [place] to reach out to try and schedule him sooner, or if you wanted a nurse to call and follow up with the patient.  Thanks!

## 2022-12-13 NOTE — Progress Notes (Signed)
Keppra elevated to 77. Will decrease dose from 750 to 500 mg BID.

## 2022-12-27 DIAGNOSIS — R269 Unspecified abnormalities of gait and mobility: Secondary | ICD-10-CM | POA: Diagnosis not present

## 2022-12-27 DIAGNOSIS — I10 Essential (primary) hypertension: Secondary | ICD-10-CM | POA: Diagnosis not present

## 2022-12-27 DIAGNOSIS — I6389 Other cerebral infarction: Secondary | ICD-10-CM | POA: Diagnosis not present

## 2022-12-27 DIAGNOSIS — R1319 Other dysphagia: Secondary | ICD-10-CM | POA: Diagnosis not present

## 2022-12-27 DIAGNOSIS — N1832 Chronic kidney disease, stage 3b: Secondary | ICD-10-CM | POA: Diagnosis not present

## 2022-12-27 DIAGNOSIS — I872 Venous insufficiency (chronic) (peripheral): Secondary | ICD-10-CM | POA: Diagnosis not present

## 2022-12-27 DIAGNOSIS — K219 Gastro-esophageal reflux disease without esophagitis: Secondary | ICD-10-CM | POA: Diagnosis not present

## 2022-12-27 DIAGNOSIS — E782 Mixed hyperlipidemia: Secondary | ICD-10-CM | POA: Diagnosis not present

## 2023-01-20 ENCOUNTER — Emergency Department (HOSPITAL_COMMUNITY): Payer: Medicare Other

## 2023-01-20 ENCOUNTER — Encounter (HOSPITAL_COMMUNITY): Payer: Self-pay

## 2023-01-20 ENCOUNTER — Other Ambulatory Visit: Payer: Self-pay

## 2023-01-20 ENCOUNTER — Emergency Department (HOSPITAL_COMMUNITY)
Admission: EM | Admit: 2023-01-20 | Discharge: 2023-01-20 | Disposition: A | Discharge status: Home / Self Care | Payer: Medicare Other | Attending: Emergency Medicine | Admitting: Emergency Medicine

## 2023-01-20 DIAGNOSIS — I6502 Occlusion and stenosis of left vertebral artery: Secondary | ICD-10-CM | POA: Diagnosis not present

## 2023-01-20 DIAGNOSIS — F419 Anxiety disorder, unspecified: Secondary | ICD-10-CM | POA: Insufficient documentation

## 2023-01-20 DIAGNOSIS — I10 Essential (primary) hypertension: Secondary | ICD-10-CM | POA: Insufficient documentation

## 2023-01-20 DIAGNOSIS — Z8673 Personal history of transient ischemic attack (TIA), and cerebral infarction without residual deficits: Secondary | ICD-10-CM | POA: Diagnosis not present

## 2023-01-20 DIAGNOSIS — Z7902 Long term (current) use of antithrombotics/antiplatelets: Secondary | ICD-10-CM | POA: Diagnosis not present

## 2023-01-20 DIAGNOSIS — R531 Weakness: Secondary | ICD-10-CM | POA: Diagnosis not present

## 2023-01-20 DIAGNOSIS — G319 Degenerative disease of nervous system, unspecified: Secondary | ICD-10-CM | POA: Diagnosis not present

## 2023-01-20 DIAGNOSIS — I6523 Occlusion and stenosis of bilateral carotid arteries: Secondary | ICD-10-CM | POA: Diagnosis not present

## 2023-01-20 DIAGNOSIS — Z79899 Other long term (current) drug therapy: Secondary | ICD-10-CM | POA: Diagnosis not present

## 2023-01-20 DIAGNOSIS — F0150 Vascular dementia without behavioral disturbance: Secondary | ICD-10-CM | POA: Insufficient documentation

## 2023-01-20 DIAGNOSIS — R9082 White matter disease, unspecified: Secondary | ICD-10-CM | POA: Diagnosis not present

## 2023-01-20 DIAGNOSIS — I639 Cerebral infarction, unspecified: Secondary | ICD-10-CM | POA: Diagnosis not present

## 2023-01-20 DIAGNOSIS — I6782 Cerebral ischemia: Secondary | ICD-10-CM | POA: Diagnosis not present

## 2023-01-20 LAB — I-STAT CHEM 8, ED
BUN: 20 mg/dL (ref 8–23)
Calcium, Ion: 1.17 mmol/L (ref 1.15–1.40)
Chloride: 110 mmol/L (ref 98–111)
Creatinine, Ser: 1.7 mg/dL — ABNORMAL HIGH (ref 0.61–1.24)
Glucose, Bld: 115 mg/dL — ABNORMAL HIGH (ref 70–99)
HCT: 34 % — ABNORMAL LOW (ref 39.0–52.0)
Hemoglobin: 11.6 g/dL — ABNORMAL LOW (ref 13.0–17.0)
Potassium: 3.7 mmol/L (ref 3.5–5.1)
Sodium: 146 mmol/L — ABNORMAL HIGH (ref 135–145)
TCO2: 23 mmol/L (ref 22–32)

## 2023-01-20 LAB — COMPREHENSIVE METABOLIC PANEL
ALT: 14 U/L (ref 0–44)
AST: 24 U/L (ref 15–41)
Albumin: 3.8 g/dL (ref 3.5–5.0)
Alkaline Phosphatase: 81 U/L (ref 38–126)
Anion gap: 8 (ref 5–15)
BUN: 18 mg/dL (ref 8–23)
CO2: 22 mmol/L (ref 22–32)
Calcium: 9.2 mg/dL (ref 8.9–10.3)
Chloride: 110 mmol/L (ref 98–111)
Creatinine, Ser: 1.73 mg/dL — ABNORMAL HIGH (ref 0.61–1.24)
GFR, Estimated: 37 mL/min — ABNORMAL LOW (ref >60)
Glucose, Bld: 120 mg/dL — ABNORMAL HIGH (ref 70–99)
Potassium: 3.6 mmol/L (ref 3.5–5.1)
Sodium: 140 mmol/L (ref 135–145)
Total Bilirubin: 1 mg/dL (ref 0.3–1.2)
Total Protein: 7.5 g/dL (ref 6.5–8.1)

## 2023-01-20 LAB — CBC
HCT: 32.1 % — ABNORMAL LOW (ref 39.0–52.0)
Hemoglobin: 9.8 g/dL — ABNORMAL LOW (ref 13.0–17.0)
MCH: 28.8 pg (ref 26.0–34.0)
MCHC: 30.5 g/dL (ref 30.0–36.0)
MCV: 94.4 fL (ref 80.0–100.0)
Platelets: 150 10*3/uL (ref 150–400)
RBC: 3.4 MIL/uL — ABNORMAL LOW (ref 4.22–5.81)
RDW: 12.7 % (ref 11.5–15.5)
WBC: 5.5 10*3/uL (ref 4.0–10.5)
nRBC: 0 % (ref 0.0–0.2)

## 2023-01-20 LAB — DIFFERENTIAL
Abs Immature Granulocytes: 0.01 10*3/uL (ref 0.00–0.07)
Basophils Absolute: 0.1 10*3/uL (ref 0.0–0.1)
Basophils Relative: 1 %
Eosinophils Absolute: 0.2 10*3/uL (ref 0.0–0.5)
Eosinophils Relative: 4 %
Immature Granulocytes: 0 %
Lymphocytes Relative: 32 %
Lymphs Abs: 1.8 10*3/uL (ref 0.7–4.0)
Monocytes Absolute: 0.5 10*3/uL (ref 0.1–1.0)
Monocytes Relative: 8 %
Neutro Abs: 3 10*3/uL (ref 1.7–7.7)
Neutrophils Relative %: 55 %

## 2023-01-20 LAB — ETHANOL: Alcohol, Ethyl (B): <10 mg/dL (ref <10)

## 2023-01-20 MED ORDER — METOPROLOL TARTRATE 25 MG PO TABS
12.5000 mg | ORAL_TABLET | Freq: Once | ORAL | Status: AC
  Administered 2023-01-20: 12.5 mg via ORAL
  Filled 2023-01-20: qty 1

## 2023-01-20 MED ORDER — IOHEXOL 350 MG/ML SOLN
65.0000 mL | Freq: Once | INTRAVENOUS | Status: AC | PRN
  Administered 2023-01-20: 65 mL via INTRAVENOUS

## 2023-01-20 NOTE — ED Triage Notes (Signed)
Pt. Stated, Ive had rt. Side weakness for 2 days.

## 2023-01-20 NOTE — ED Provider Notes (Signed)
[place] EMERGENCY DEPARTMENT AT [facility] Provider Note   CSN: [id number] Arrival date & time: 01/20/23  1023     History  Chief Complaint  Patient presents with   Weakness    Carlos Shields is a 89 y.o. male.  History obtained per the patient's daughter due to his dementia.  89 year old male with a history of vascular dementia, gait abnormality and right-sided weakness secondary to stroke without significant persistent deficits with left carotid revascularization, seizures on Keppra, and hypertension who presents to the emergency department with right-sided weakness.  Daughter reports that he was last seen well on Tuesday.  Last night she noticed that he seemed weaker on his right side than usual and also had some slurred speech.  Says that she also noticed that he had some facial droop and right lower extremity weakness along with his right arm which was weaker.  Says that over the past few weeks he has been more confused than usual but otherwise has had no changes to his health and no recent illnesses.  She does manage his medications which include Keppra and Plavix.       Home Medications Prior to Admission medications   Medication Sig Start Date End Date Taking? Authorizing Provider  acetaminophen (TYLENOL) 325 MG tablet Take 1-2 tablets (325-650 mg total) by mouth every 4 (four) hours as needed for mild pain. 08/26/22   Angiulli, Mcarthur Rossetti, PA-C  amLODipine (NORVASC) 5 MG tablet Take 1 tablet (5 mg total) by mouth daily. 08/26/22   Angiulli, Mcarthur Rossetti, PA-C  clopidogrel (PLAVIX) 75 MG tablet Take 1 tablet (75 mg total) by mouth daily. 09/08/22   Jones Bales, NP  COMIRNATY SUSP injection  04/28/22   [provider]  levETIRAcetam (KEPPRA) 500 MG tablet Take 1 tablet (500 mg total) by mouth 2 (two) times daily. 12/13/22 07/11/23  Windell Norfolk, MD  memantine (NAMENDA) 10 MG tablet Take 1 tablet (10 mg total) by mouth 2 (two) times daily. 09/30/22 09/25/23  Windell Norfolk, MD  metoprolol tartrate (LOPRESSOR) 25 MG tablet Take 0.5 tablets (12.5 mg total) by mouth 2 (two) times daily. 08/26/22   Angiulli, Mcarthur Rossetti, PA-C  pantoprazole (PROTONIX) 40 MG tablet Take 1 tablet (40 mg total) by mouth daily. 08/26/22   Angiulli, Mcarthur Rossetti, PA-C  polyethylene glycol (MIRALAX / GLYCOLAX) 17 g packet Take 17 g by mouth daily. 08/26/22   Angiulli, Mcarthur Rossetti, PA-C  rosuvastatin (CRESTOR) 10 MG tablet Take 1 tablet (10 mg total) by mouth daily. 08/26/22   Angiulli, Mcarthur Rossetti, PA-C      Allergies    Patient has no known allergies.    Review of Systems   Review of Systems  Physical Exam Updated Vital Signs BP 132/61   Pulse (!) 53   Temp 98.5 F (36.9 C) (Oral)   Resp 13   SpO2 100%  Physical Exam Vitals and nursing note reviewed.  Constitutional:      General: He is not in acute distress.    Appearance: He is well-developed.  HENT:     Head: Normocephalic and atraumatic.     Right Ear: External ear normal.     Left Ear: External ear normal.     Nose: Nose normal.  Eyes:     Extraocular Movements: Extraocular movements intact.     Conjunctiva/sclera: Conjunctivae normal.     Pupils: Pupils are equal, round, and reactive to light.  Cardiovascular:     Rate and Rhythm: Normal  rate and regular rhythm.     Heart sounds: Normal heart sounds.  Pulmonary:     Effort: Pulmonary effort is normal. No respiratory distress.     Breath sounds: Normal breath sounds.  Abdominal:     Palpations: Abdomen is soft.  Musculoskeletal:     Cervical back: Normal range of motion and neck supple.     Right lower leg: No edema.     Left lower leg: No edema.  Skin:    General: Skin is warm and dry.  Neurological:     Mental Status: He is alert. Mental status is at baseline.     Comments: NIHSS Exam  Level of Consciousness: Alert  LOC Questions: Answers Month and Age incorrectly (at baseline per daughter because of his dementia) LOC Commands: Opens and Closes Eyes and Hands  on command  Best Gaze: Horizontal ocular movements intact  Visual Fields: No visual field loss  Facial Palsy: None  L Upper Extremity Motor: No drift after 10 seconds  R Upper Extremity Motor: No drift after 10 seconds  L Lower extremity Motor: No drift after 5 seconds  R Lower extremity Motor: No drift after 5 seconds  Ataxia: Absent  Sensory: Intact sensation to pinprick on face, arms, trunk, and legs bilaterally  Best Language: No aphasia  Dysarthria: No dysarthria  Neglect: No visual or sensory neglect    Psychiatric:        Mood and Affect: Mood normal.        Behavior: Behavior normal.     ED Results / Procedures / Treatments   Labs (all labs ordered are listed, but only abnormal results are displayed) Labs Reviewed  CBC - Abnormal; Notable for the following components:      Result Value   RBC 3.40 (*)    Hemoglobin 9.8 (*)    HCT 32.1 (*)    All other components within normal limits  COMPREHENSIVE METABOLIC PANEL - Abnormal; Notable for the following components:   Glucose, Bld 120 (*)    Creatinine, Ser 1.73 (*)    GFR, Estimated 37 (*)    All other components within normal limits  I-STAT CHEM 8, ED - Abnormal; Notable for the following components:   Sodium 146 (*)    Creatinine, Ser 1.70 (*)    Glucose, Bld 115 (*)    Hemoglobin 11.6 (*)    HCT 34.0 (*)    All other components within normal limits  ETHANOL  DIFFERENTIAL  RAPID URINE DRUG SCREEN, HOSP PERFORMED  URINALYSIS, ROUTINE W REFLEX MICROSCOPIC    EKG EKG Interpretation Date/Time:  Thursday January 20 2023 13:44:36 EDT Ventricular Rate:  60 PR Interval:  207 QRS Duration:  109 QT Interval:  493 QTC Calculation: 493 R Axis:   -36  Text Interpretation: Unknown rhythm, irregular rate Left axis deviation Nonspecific T abnrm, anterolateral leads Borderline prolonged QT interval When compared to 01/20/23 QT is prolonged Confirmed by Vonita Moss [phone]) on 01/20/2023 3:21:34 PM  Radiology DG Chest  2 View  Result Date: 01/20/2023 CLINICAL DATA:  Stroke EXAM: CHEST - 2 VIEW COMPARISON:  None Available. FINDINGS: The heart size and mediastinal contours are within normal limits. Both lungs are clear. The visualized skeletal structures are unremarkable. IMPRESSION: No active cardiopulmonary disease. Electronically Signed   By: Duanne Guess D.O.   On: 01/20/2023 14:38   CT ANGIO HEAD NECK W WO CM  Result Date: 01/20/2023 CLINICAL DATA:  Transient right-sided weakness. Patient reports symptoms for 2 days.  EXAM: CT ANGIOGRAPHY HEAD AND NECK WITH AND WITHOUT CONTRAST TECHNIQUE: Multidetector CT imaging of the head and neck was performed using the standard protocol during bolus administration of intravenous contrast. Multiplanar CT image reconstructions and MIPs were obtained to evaluate the vascular anatomy. Carotid stenosis measurements (when applicable) are obtained utilizing NASCET criteria, using the distal internal carotid diameter as the denominator. RADIATION DOSE REDUCTION: This exam was performed according to the departmental dose-optimization program which includes automated exposure control, adjustment of the mA and/or kV according to patient size and/or use of iterative reconstruction technique. CONTRAST:  65mL OMNIPAQUE IOHEXOL 350 MG/ML SOLN COMPARISON:  Head without contrast 11/12/2022 FINDINGS: CT HEAD FINDINGS Brain: Moderate generalized atrophy and white matter disease is similar to the prior exam. No acute infarct, hemorrhage, or mass lesion is present. Remote focal lacunar infarct is present in the right external capsule. A remote lacunar infarct is again noted in the left thalamus. The ventricles are proportionate to the degree of atrophy. No significant extraaxial fluid collection is present. The brainstem and cerebellum are within normal limits. Midline structures are within normal limits. Vascular: Extensive atherosclerotic calcifications are again noted within the cavernous internal  carotid arteries. No hyperdense vessel is present. Skull: Calvarium is intact. No focal lytic or blastic lesions are present. No significant extracranial soft tissue lesion is present. Sinuses/Orbits: Chronic wall thickening is present within the maxillary sinuses bilaterally. No active mucosal disease is present. A posterior left ethmoid air cell is chronically opacified. Paranasal sinuses and mastoid air cells are otherwise clear. Cerumen is present in the external auditory canals bilaterally. Bilateral lens replacements are present. Globes and orbits are otherwise within normal limits. Review of the MIP images confirms the above findings CTA NECK FINDINGS Aortic arch: Atherosclerotic calcifications are present at the aortic arch is a great vessel origins. Calcified and noncalcified plaque is present posteriorly in the proximal left subclavian artery without a significant stenosis of greater than 50% atherosclerotic calcifications extend into the left subclavian artery. Similar atherosclerotic calcifications are present in the right subclavian artery without significant stenosis. Right carotid system: The right common carotid artery is within normal limits. Atherosclerotic calcifications are present at the bifurcation and proximal right ICA. Minimal lumen diameter is 3 mm without a significant stenosis relative to the more distal vessel. Left carotid system: The left common carotid artery is within normal limits. Atherosclerotic changes are present at the proximal left ICA. A stent crosses the bifurcation. The lumen of the vessel is improved. The minimal luminal diameter is 2.1 mm. This compares with a more distal vessel of 5.5 mm. The lumen is improved. Vertebral arteries: The left vertebral artery is dominant vessel. Both vertebral arteries originate from the subclavian arteries. High-grade stenosis is present at the origin of the left vertebral artery. No other significant stenosis is present in either  vertebral artery in the neck. Skeleton: Slight retrolisthesis at C5-6 stable. Mild curvature of the normal cervical lordosis is present. Vertebral body heights and alignment are otherwise normal. Other neck: Soft tissues the neck are otherwise unremarkable. Salivary glands are within normal limits. Thyroid is normal. No significant adenopathy is present. No focal mucosal or submucosal lesions are present. Upper chest: Centrilobular emphysematous changes are present. Lung apices are otherwise clear. Review of the MIP images confirms the above findings CTA HEAD FINDINGS Anterior circulation: Atherosclerotic calcifications are present within the cavernous internal carotid arteries bilaterally. The right ophthalmic segment is narrowed to 1.1 mm. The more distal right ICA terminus measures 2.2 mm.  No significant stenosis is present on the left. In early left MCA branch extends from the distal left ICA. MCA bifurcations are otherwise normal. The anterior communicating artery is patent. The ACA and MCA branch vessels are normal. Posterior circulation: The vertebral arteries are within normal limits bilaterally. The PICA origins are visualized and normal. The vertebrobasilar junction and basilar artery are normal. Superior cerebellar arteries are patent. Both posterior cerebral arteries originate from basilar tip. Mild narrowing is present in the right P2 segment. A high-grade stenosis is present in the proximal inferior left P3 segment. Venous sinuses: The dural sinuses are patent. The straight sinus and deep cerebral veins are intact. Cortical veins are within normal limits. No significant vascular malformation is evident. Anatomic variants: None Review of the MIP images confirms the above findings IMPRESSION: 1. No acute intracranial abnormality or significant interval change. 2. Stable moderate generalized atrophy and white matter disease, likely within normal limits for age. 3. Remote focal lacunar infarcts of the  right external capsule and left thalamus. 4. Atherosclerotic changes at the right carotid bifurcation and proximal right ICA without significant stenosis relative to the more distal vessel. A 50% stenosis is present in the ophthalmic segment of the right ICA. 5. Left carotid bifurcation stent is patent with improved luminal diameter. 6. High-grade stenosis at the origin of the left vertebral artery. 7. High-grade stenosis of the proximal inferior left P3 segment. 8. Mild narrowing of the right P2 segment. 9. No other significant proximal stenosis, aneurysm, or branch vessel occlusion within the Circle of Willis. 10. Aortic Atherosclerosis (ICD10-I70.0) and Emphysema (ICD10-J43.9). Electronically Signed   By: Marin Roberts M.D.   On: 01/20/2023 13:26    Procedures Procedures    Medications Ordered in ED Medications  iohexol (OMNIPAQUE) 350 MG/ML injection 65 mL (65 mLs Intravenous Contrast Given 01/20/23 1227)    ED Course/ Medical Decision Making/ A&P Clinical Course as of 01/20/23 1728  Thu Jan 20, 2023  1451 Hemoglobin(!): 9.8 At baseline [RP]  1451 Creatinine(!): 1.73 At baseline [RP]    Clinical Course User Index [RP] Rondel Baton, MD                             Medical Decision Making Amount and/or Complexity of Data Reviewed Labs: ordered. Decision-making details documented in ED Course. Radiology: ordered.  Risk Prescription drug management.   Carlos Shields is a 89 y.o. male with comorbidities that complicate the patient evaluation including vascular dementia, gait abnormality and right-sided weakness secondary to stroke without significant persistent deficits with left carotid revascularization, seizures on Keppra, and hypertension who presents to the emergency department with right-sided weakness.    Initial Ddx:  Stroke, recrudescence, TIA, seizure/Todd's paralysis  MDM/Course:  Patient arrives with 2 days of right-sided weakness that on exam appears to have  resolved.  This is concerning for either a TIA or recrudescence of an old stroke since this is where he was having his weakness before.  Did obtain a CT of the head without contrast which did not show any acute abnormalities.  His CTA did show some areas of stenosis but no M1 stenosis or significant carotid stenosis on the left that would explain his symptoms.  Upon re-evaluation patient remained stable.  He did have a chest x-ray as well as a urinalysis that were sent to evaluate for any infection that would cause recrudescence of his old stroke.  Chest x-ray was unremarkable and his urinalysis  is pending at this time.  Discussed with Dr. Otelia Limes from neurology who feels that since the patient is already on Plavix and had deficits in this location previously that a TIA is highly unlikely and if this were from a TIA he is already medically optimized.  Recommends MRI and if the MRI is unremarkable the patient can be discharged home with follow-up with neurology as an outpatient.  Signed out to the oncoming physician (Dr. Deretha Emory) awaiting his MRI and urinalysis.  This patient presents to the ED for concern of complaints listed in HPI, this involves an extensive number of treatment options, and is a complaint that carries with it a high risk of complications and morbidity. Disposition including potential need for admission considered.   Dispo: Pending remainder of workup  Additional history obtained from daughter Records reviewed Outpatient Clinic Notes The following labs were independently interpreted: Chemistry and show CKD I independently reviewed the following imaging with scope of interpretation limited to determining acute life threatening conditions related to emergency care: CT Head and agree with the radiologist interpretation with the following exceptions: none I personally reviewed and interpreted cardiac monitoring: normal sinus rhythm  I personally reviewed and interpreted the pt's EKG: see  above for interpretation  I have reviewed the patients home medications and made adjustments as needed Consults: Neurology Social Determinants of health:  Elderly         Final Clinical Impression(s) / ED Diagnoses Final diagnoses:  Right sided weakness  History of stroke  Vascular dementia, unspecified dementia severity, unspecified whether behavioral, psychotic, or mood disturbance or anxiety (HCC)    Rx / DC Orders ED Discharge Orders     None         Rondel Baton, MD 01/20/23 1729

## 2023-01-20 NOTE — ED Provider Notes (Signed)
Patient's MRI negative for any evidence of acute stroke.  Dr. Jarold Motto did discuss this with Dr. Otelia Limes if MRI was negative since patient is on Plavix he was stable for discharge home and follow-up with [facility] neurology.  Noted that blood pressure has gotten high here just in the last few hours.  Patient is normally on Lopressor twice a day 12.5 mg.  Will give his second dose of Lopressor now.  Again patient cleared for discharge home.  Patient does have a history of dementia and has been some increased confusion and weakness.  But no evidence of any acute stroke.   Vanetta Mulders, MD 01/20/23 [phone]

## 2023-01-20 NOTE — ED Notes (Signed)
Attempted for the 3rd time to get urine sample

## 2023-01-20 NOTE — Discharge Instructions (Addendum)
MRI negative.  Follow-up with [facility] neurology.  No evidence of new stroke.

## 2023-01-21 ENCOUNTER — Other Ambulatory Visit: Payer: Self-pay

## 2023-01-21 ENCOUNTER — Emergency Department (HOSPITAL_COMMUNITY)
Admission: EM | Admit: 2023-01-21 | Discharge: 2023-01-21 | Disposition: A | Discharge status: Home / Self Care | Payer: Medicare Other | Attending: Emergency Medicine | Admitting: Emergency Medicine

## 2023-01-21 DIAGNOSIS — Z743 Need for continuous supervision: Secondary | ICD-10-CM | POA: Diagnosis not present

## 2023-01-21 DIAGNOSIS — Z7901 Long term (current) use of anticoagulants: Secondary | ICD-10-CM | POA: Diagnosis not present

## 2023-01-21 DIAGNOSIS — R569 Unspecified convulsions: Secondary | ICD-10-CM | POA: Diagnosis not present

## 2023-01-21 DIAGNOSIS — R000 Tachycardia, unspecified: Secondary | ICD-10-CM | POA: Diagnosis not present

## 2023-01-21 LAB — CBC WITH DIFFERENTIAL/PLATELET
Abs Immature Granulocytes: 0.01 10*3/uL (ref 0.00–0.07)
Basophils Absolute: 0 10*3/uL (ref 0.0–0.1)
Basophils Relative: 1 %
Eosinophils Absolute: 0.1 10*3/uL (ref 0.0–0.5)
Eosinophils Relative: 1 %
HCT: 32.6 % — ABNORMAL LOW (ref 39.0–52.0)
Hemoglobin: 10 g/dL — ABNORMAL LOW (ref 13.0–17.0)
Immature Granulocytes: 0 %
Lymphocytes Relative: 25 %
Lymphs Abs: 1.6 10*3/uL (ref 0.7–4.0)
MCH: 28.2 pg (ref 26.0–34.0)
MCHC: 30.7 g/dL (ref 30.0–36.0)
MCV: 91.8 fL (ref 80.0–100.0)
Monocytes Absolute: 0.7 10*3/uL (ref 0.1–1.0)
Monocytes Relative: 11 %
Neutro Abs: 4 10*3/uL (ref 1.7–7.7)
Neutrophils Relative %: 62 %
Platelets: 156 10*3/uL (ref 150–400)
RBC: 3.55 MIL/uL — ABNORMAL LOW (ref 4.22–5.81)
RDW: 12.8 % (ref 11.5–15.5)
WBC: 6.4 10*3/uL (ref 4.0–10.5)
nRBC: 0 % (ref 0.0–0.2)

## 2023-01-21 LAB — BASIC METABOLIC PANEL
Anion gap: 10 (ref 5–15)
BUN: 19 mg/dL (ref 8–23)
CO2: 18 mmol/L — ABNORMAL LOW (ref 22–32)
Calcium: 9.1 mg/dL (ref 8.9–10.3)
Chloride: 112 mmol/L — ABNORMAL HIGH (ref 98–111)
Creatinine, Ser: 1.79 mg/dL — ABNORMAL HIGH (ref 0.61–1.24)
GFR, Estimated: 36 mL/min — ABNORMAL LOW (ref >60)
Glucose, Bld: 168 mg/dL — ABNORMAL HIGH (ref 70–99)
Potassium: 4.5 mmol/L (ref 3.5–5.1)
Sodium: 140 mmol/L (ref 135–145)

## 2023-01-21 LAB — URINALYSIS, W/ REFLEX TO CULTURE (INFECTION SUSPECTED)
Bilirubin Urine: NEGATIVE
Glucose, UA: NEGATIVE mg/dL
Hgb urine dipstick: NEGATIVE
Ketones, ur: NEGATIVE mg/dL
Leukocytes,Ua: NEGATIVE
Nitrite: NEGATIVE
Protein, ur: NEGATIVE mg/dL
Specific Gravity, Urine: 1.023 (ref 1.005–1.030)
pH: 6 (ref 5.0–8.0)

## 2023-01-21 NOTE — Discharge Instructions (Signed)
Return for any problem.  ?

## 2023-01-21 NOTE — ED Triage Notes (Signed)
Pt to ED via [facility] from home , pt lives with daughter and home health aid. Apparently pt had two witnessed seizures. Pt has hx of seizures and dementia. Unknown if patient has been compliant with medication regimen.   Last VS: hr 66, 96%RA, CBG 208 BP 146/69, rr18  #22 R forearm, no meds given

## 2023-01-21 NOTE — ED Provider Notes (Signed)
[place] EMERGENCY DEPARTMENT AT [facility] Provider Note   CSN: [id number] Arrival date & time: 01/21/23  1122     History  Chief Complaint  Patient presents with   Seizures    Carlos Shields is a 89 y.o. male.  89 year old male with prior medical history as detailed below presents for evaluation.  Patient arrives from home with EMS transport.  Per EMS, patient with seizure seen this morning by home health aide.  EMS reports that aide witnessed seizure activity that was brief and resolved on its own.  Patient is prescribed Keppra for history of seizure.  Patient is pleasantly confused.  He appears to be at baseline.  He denies any current complaint.  Of note, patient was seen yesterday for workup of CVA -  and eventually discharged.   The history is provided by the patient and medical records.       Home Medications Prior to Admission medications   Medication Sig Start Date End Date Taking? Authorizing Provider  acetaminophen (TYLENOL) 325 MG tablet Take 1-2 tablets (325-650 mg total) by mouth every 4 (four) hours as needed for mild pain. 08/26/22   Angiulli, Mcarthur Rossetti, PA-C  amLODipine (NORVASC) 5 MG tablet Take 1 tablet (5 mg total) by mouth daily. 08/26/22   Angiulli, Mcarthur Rossetti, PA-C  clopidogrel (PLAVIX) 75 MG tablet Take 1 tablet (75 mg total) by mouth daily. 09/08/22   Jones Bales, NP  COMIRNATY SUSP injection  04/28/22   [provider]  levETIRAcetam (KEPPRA) 500 MG tablet Take 1 tablet (500 mg total) by mouth 2 (two) times daily. 12/13/22 07/11/23  Windell Norfolk, MD  memantine (NAMENDA) 10 MG tablet Take 1 tablet (10 mg total) by mouth 2 (two) times daily. 09/30/22 09/25/23  Windell Norfolk, MD  metoprolol tartrate (LOPRESSOR) 25 MG tablet Take 0.5 tablets (12.5 mg total) by mouth 2 (two) times daily. 08/26/22   Angiulli, Mcarthur Rossetti, PA-C  pantoprazole (PROTONIX) 40 MG tablet Take 1 tablet (40 mg total) by mouth daily. 08/26/22   Angiulli, Mcarthur Rossetti, PA-C   polyethylene glycol (MIRALAX / GLYCOLAX) 17 g packet Take 17 g by mouth daily. 08/26/22   Angiulli, Mcarthur Rossetti, PA-C  rosuvastatin (CRESTOR) 10 MG tablet Take 1 tablet (10 mg total) by mouth daily. 08/26/22   Angiulli, Mcarthur Rossetti, PA-C      Allergies    Patient has no known allergies.    Review of Systems   Review of Systems  All other systems reviewed and are negative.   Physical Exam Updated Vital Signs BP (!) 144/69 (BP Location: Right Arm)   Pulse 65   Temp 98.1 F (36.7 C) (Oral)   Resp 16   SpO2 100%  Physical Exam Vitals and nursing note reviewed.  Constitutional:      General: He is not in acute distress.    Appearance: Normal appearance. He is well-developed.  HENT:     Head: Normocephalic and atraumatic.  Eyes:     Conjunctiva/sclera: Conjunctivae normal.     Pupils: Pupils are equal, round, and reactive to light.  Cardiovascular:     Rate and Rhythm: Normal rate and regular rhythm.     Heart sounds: Normal heart sounds.  Pulmonary:     Effort: Pulmonary effort is normal. No respiratory distress.     Breath sounds: Normal breath sounds.  Abdominal:     General: There is no distension.     Palpations: Abdomen is soft.     Tenderness:  There is no abdominal tenderness.  Musculoskeletal:        General: No deformity. Normal range of motion.     Cervical back: Normal range of motion and neck supple.  Skin:    General: Skin is warm and dry.  Neurological:     General: No focal deficit present.     Mental Status: He is alert and oriented to person, place, and time.     ED Results / Procedures / Treatments   Labs (all labs ordered are listed, but only abnormal results are displayed) Labs Reviewed  BASIC METABOLIC PANEL - Abnormal; Notable for the following components:      Result Value   Chloride 112 (*)    CO2 18 (*)    Glucose, Bld 168 (*)    Creatinine, Ser 1.79 (*)    GFR, Estimated 36 (*)    All other components within normal limits  CBC WITH  DIFFERENTIAL/PLATELET  URINALYSIS, W/ REFLEX TO CULTURE (INFECTION SUSPECTED)  LEVETIRACETAM LEVEL  CBC WITH DIFFERENTIAL/PLATELET    EKG EKG Interpretation Date/Time:  Friday January 21 2023 11:43:16 EDT Ventricular Rate:  66 PR Interval:    QRS Duration:  106 QT Interval:  455 QTC Calculation: 477 R Axis:   241  Text Interpretation: Right and left arm electrode reversal, interpretation assumes no reversal Atrial fibrillation Right superior axis Repol abnrm suggests ischemia, anterolateral Artifact in lead(s) I II III aVR aVL aVF V2 V3 V4 V5 V6 Confirmed by Kristine Royal [phone]) on 01/21/2023 11:49:25 AM  Radiology MR BRAIN WO CONTRAST  Result Date: 01/20/2023 CLINICAL DATA:  transient R sided weakness. EXAM: MRI HEAD WITHOUT CONTRAST TECHNIQUE: Multiplanar, multiecho pulse sequences of the brain and surrounding structures were obtained without intravenous contrast. COMPARISON:  MRI head 08/05/2022.  Number FINDINGS: Brain: No acute infarction, hemorrhage, hydrocephalus, extra-axial collection or mass lesion. Scattered T2/FLAIR hyperintensities in the white matter, compatible with chronic microvascular disease. Cerebral atrophy. Vascular: Major arterial flow voids are maintained at the skull base. Skull and upper cervical spine: Normal marrow signal. Sinuses/Orbits: Largely clear sinuses.  No acute orbital findings. Other: No mastoid effusions. IMPRESSION: 1. No evidence of acute intracranial abnormality. 2. Cerebral atrophy (ICD10-G31.9) and chronic microvascular ischemic disease. Electronically Signed   By: Feliberto Harts M.D.   On: 01/20/2023 18:24   DG Chest 2 View  Result Date: 01/20/2023 CLINICAL DATA:  Stroke EXAM: CHEST - 2 VIEW COMPARISON:  None Available. FINDINGS: The heart size and mediastinal contours are within normal limits. Both lungs are clear. The visualized skeletal structures are unremarkable. IMPRESSION: No active cardiopulmonary disease. Electronically Signed   By:  Duanne Guess D.O.   On: 01/20/2023 14:38   CT ANGIO HEAD NECK W WO CM  Result Date: 01/20/2023 CLINICAL DATA:  Transient right-sided weakness. Patient reports symptoms for 2 days. EXAM: CT ANGIOGRAPHY HEAD AND NECK WITH AND WITHOUT CONTRAST TECHNIQUE: Multidetector CT imaging of the head and neck was performed using the standard protocol during bolus administration of intravenous contrast. Multiplanar CT image reconstructions and MIPs were obtained to evaluate the vascular anatomy. Carotid stenosis measurements (when applicable) are obtained utilizing NASCET criteria, using the distal internal carotid diameter as the denominator. RADIATION DOSE REDUCTION: This exam was performed according to the departmental dose-optimization program which includes automated exposure control, adjustment of the mA and/or kV according to patient size and/or use of iterative reconstruction technique. CONTRAST:  65mL OMNIPAQUE IOHEXOL 350 MG/ML SOLN COMPARISON:  Head without contrast 11/12/2022 FINDINGS: CT HEAD FINDINGS  Brain: Moderate generalized atrophy and white matter disease is similar to the prior exam. No acute infarct, hemorrhage, or mass lesion is present. Remote focal lacunar infarct is present in the right external capsule. A remote lacunar infarct is again noted in the left thalamus. The ventricles are proportionate to the degree of atrophy. No significant extraaxial fluid collection is present. The brainstem and cerebellum are within normal limits. Midline structures are within normal limits. Vascular: Extensive atherosclerotic calcifications are again noted within the cavernous internal carotid arteries. No hyperdense vessel is present. Skull: Calvarium is intact. No focal lytic or blastic lesions are present. No significant extracranial soft tissue lesion is present. Sinuses/Orbits: Chronic wall thickening is present within the maxillary sinuses bilaterally. No active mucosal disease is present. A posterior left  ethmoid air cell is chronically opacified. Paranasal sinuses and mastoid air cells are otherwise clear. Cerumen is present in the external auditory canals bilaterally. Bilateral lens replacements are present. Globes and orbits are otherwise within normal limits. Review of the MIP images confirms the above findings CTA NECK FINDINGS Aortic arch: Atherosclerotic calcifications are present at the aortic arch is a great vessel origins. Calcified and noncalcified plaque is present posteriorly in the proximal left subclavian artery without a significant stenosis of greater than 50% atherosclerotic calcifications extend into the left subclavian artery. Similar atherosclerotic calcifications are present in the right subclavian artery without significant stenosis. Right carotid system: The right common carotid artery is within normal limits. Atherosclerotic calcifications are present at the bifurcation and proximal right ICA. Minimal lumen diameter is 3 mm without a significant stenosis relative to the more distal vessel. Left carotid system: The left common carotid artery is within normal limits. Atherosclerotic changes are present at the proximal left ICA. A stent crosses the bifurcation. The lumen of the vessel is improved. The minimal luminal diameter is 2.1 mm. This compares with a more distal vessel of 5.5 mm. The lumen is improved. Vertebral arteries: The left vertebral artery is dominant vessel. Both vertebral arteries originate from the subclavian arteries. High-grade stenosis is present at the origin of the left vertebral artery. No other significant stenosis is present in either vertebral artery in the neck. Skeleton: Slight retrolisthesis at C5-6 stable. Mild curvature of the normal cervical lordosis is present. Vertebral body heights and alignment are otherwise normal. Other neck: Soft tissues the neck are otherwise unremarkable. Salivary glands are within normal limits. Thyroid is normal. No significant  adenopathy is present. No focal mucosal or submucosal lesions are present. Upper chest: Centrilobular emphysematous changes are present. Lung apices are otherwise clear. Review of the MIP images confirms the above findings CTA HEAD FINDINGS Anterior circulation: Atherosclerotic calcifications are present within the cavernous internal carotid arteries bilaterally. The right ophthalmic segment is narrowed to 1.1 mm. The more distal right ICA terminus measures 2.2 mm. No significant stenosis is present on the left. In early left MCA branch extends from the distal left ICA. MCA bifurcations are otherwise normal. The anterior communicating artery is patent. The ACA and MCA branch vessels are normal. Posterior circulation: The vertebral arteries are within normal limits bilaterally. The PICA origins are visualized and normal. The vertebrobasilar junction and basilar artery are normal. Superior cerebellar arteries are patent. Both posterior cerebral arteries originate from basilar tip. Mild narrowing is present in the right P2 segment. A high-grade stenosis is present in the proximal inferior left P3 segment. Venous sinuses: The dural sinuses are patent. The straight sinus and deep cerebral veins are intact. Cortical veins  are within normal limits. No significant vascular malformation is evident. Anatomic variants: None Review of the MIP images confirms the above findings IMPRESSION: 1. No acute intracranial abnormality or significant interval change. 2. Stable moderate generalized atrophy and white matter disease, likely within normal limits for age. 3. Remote focal lacunar infarcts of the right external capsule and left thalamus. 4. Atherosclerotic changes at the right carotid bifurcation and proximal right ICA without significant stenosis relative to the more distal vessel. A 50% stenosis is present in the ophthalmic segment of the right ICA. 5. Left carotid bifurcation stent is patent with improved luminal diameter. 6.  High-grade stenosis at the origin of the left vertebral artery. 7. High-grade stenosis of the proximal inferior left P3 segment. 8. Mild narrowing of the right P2 segment. 9. No other significant proximal stenosis, aneurysm, or branch vessel occlusion within the Circle of Willis. 10. Aortic Atherosclerosis (ICD10-I70.0) and Emphysema (ICD10-J43.9). Electronically Signed   By: Marin Roberts M.D.   On: 01/20/2023 13:26    Procedures Procedures    Medications Ordered in ED Medications - No data to display  ED Course/ Medical Decision Making/ A&P                             Medical Decision Making Amount and/or Complexity of Data Reviewed Labs: ordered.    Medical Screen Complete  This patient presented to the ED with complaint of seizure.  This complaint involves an extensive number of treatment options. The initial differential diagnosis includes, but is not limited to, metabolic abnormality, infection, breakthrough seizure, etc.  This presentation is: Acute, Chronic, Self-Limited, Previously Undiagnosed, Uncertain Prognosis, Complicated, Systemic Symptoms, and Threat to Life/Bodily Function  Patient with history of seizure.  Patient with possible breakthrough seizure today.  Patient without seizure with EMS or in the ED.  Patient appears to be compliant with previously prescribed Keppra.  Patient is at baseline mental status -admittedly mildly demented.  Screening labs obtained are without significant abnormality.  Patient's family understands need for close outpatient follow-up.  Patient has been compliant with Keppra as previously prescribed.    Importance of close follow-up is stressed.  Strict return precautions given understood.   Additional history obtained:  External records from outside sources obtained and reviewed including prior ED visits and prior Inpatient records.    Lab Tests:  I ordered and personally interpreted labs.    Cardiac Monitoring:  The  patient was maintained on a cardiac monitor.  I personally viewed and interpreted the cardiac monitor which showed an underlying rhythm of: NSR  Problem List / ED Course:  Possible Breakthrough Seizure   Reevaluation:  After the interventions noted above, I reevaluated the patient and found that they have: resolved  Disposition:  After consideration of the diagnostic results and the patients response to treatment, I feel that the patent would benefit from close outpatient followup.          Final Clinical Impression(s) / ED Diagnoses Final diagnoses:  Seizure [facility])    Rx / DC Orders ED Discharge Orders     None         Wynetta Fines, MD 01/21/23 1621

## 2023-01-22 LAB — LEVETIRACETAM LEVEL: Levetiracetam Lvl: 67.8 ug/mL — ABNORMAL HIGH (ref 10.0–40.0)

## 2023-01-26 DIAGNOSIS — G319 Degenerative disease of nervous system, unspecified: Secondary | ICD-10-CM | POA: Diagnosis not present

## 2023-01-26 DIAGNOSIS — N1832 Chronic kidney disease, stage 3b: Secondary | ICD-10-CM | POA: Diagnosis not present

## 2023-01-26 DIAGNOSIS — D631 Anemia in chronic kidney disease: Secondary | ICD-10-CM | POA: Diagnosis not present

## 2023-01-26 DIAGNOSIS — I129 Hypertensive chronic kidney disease with stage 1 through stage 4 chronic kidney disease, or unspecified chronic kidney disease: Secondary | ICD-10-CM | POA: Diagnosis not present

## 2023-01-27 ENCOUNTER — Telehealth: Payer: Self-pay

## 2023-01-27 NOTE — Telephone Encounter (Signed)
Transition Care Management Unsuccessful Follow-up Telephone Call  Date of discharge and from where:  [name]  Attempts:  1st Attempt  Reason for unsuccessful TCM follow-up call:  No answer/busy   [facility], [facility] [phone] [address] [place], [place], [place] [address] Phone: [phone] Email: [email or web address]

## 2023-01-27 NOTE — Telephone Encounter (Signed)
Transition Care Management Follow-up Telephone Call Date of discharge and from where: [facility] How have you been since you were released from the hospital? Better Any questions or concerns? Yes   Items Reviewed: Did the pt receive and understand the discharge instructions provided? Yes  Medications obtained and verified? Yes  Other? No  Any new allergies since your discharge? No  Dietary orders reviewed? No Do you have support at home? Yes     Follow up appointments reviewed:  PCP Hospital f/u appt confirmed? Yes  Scheduled to see  on the phone  @ . Specialist Hospital f/u appt confirmed? No  Scheduled to see  on  @ . Are transportation arrangements needed? No  If their condition worsens, is the pt aware to call PCP or go to the Emergency Dept.? Yes Was the patient provided with contact information for the PCP's office or ED? Yes Was to pt encouraged to call back with questions or concerns? Yes

## 2023-01-31 ENCOUNTER — Ambulatory Visit: Payer: Self-pay | Admitting: Licensed Clinical Social Worker

## 2023-01-31 NOTE — Patient Instructions (Signed)
Visit Information  Thank you for taking time to visit with me today. Please don't hesitate to contact me if I can be of assistance to you.   Following are the goals we discussed today:   Goals Addressed             This Visit's Progress    Patient uses a walker to help him walk       Interventions:  Spoke via phone with Docia Furl, daughter and contact for client. Spoke with [place] about client needs.  [name] said client uses a walker to help him walk.  She is hoping client can begin receiving HHPT and she has talked with PA about this request for client Client is eating well and sleeping well, per [facility]. Discussed medication procurement for client Discussed program support with RN, LCSW, Pharmacist LCSW and [name] spoke of DME for client. She said she would like to get a hospital bed for client to use. LCSW mentioned to [facility] that sometimes hospital beds can be rented on a month to month basis.  She said it is difficult financially for client at this time.   Discussed in home care.for client. [name] said she is paying for home health  aide for client as scheduled this week. Aide helps with bathing client, cooking needs, and client care support Discussed client support with NP Cameron Proud for call with LCSW today. Encouraged client or Docia Furl to call LCSW as needed for client SW support at [phone].         Our next appointment is by telephone on 03/08/23 at 1:00 PM   Please call the care guide team at [phone] if you need to cancel or reschedule your appointment.   If you are experiencing a Mental Health or Behavioral Health Crisis or need someone to talk to, please go to [facility] [address], [place] [phone])   The patient / Docia Furl, daughter, verbalized understanding of instructions, educational materials, and care plan provided today and DECLINED offer to receive copy of patient  instructions, educational materials, and care plan.   The patient/ Docia Furl, daughter,  has been provided with contact information for the care management team and has been advised to call with any health related questions or concerns.   Carlos Shields.Carlos Shields MSW, LCSW Licensed Visual merchandiser [facility] Care Management [phone]

## 2023-01-31 NOTE — Patient Outreach (Signed)
  Care Coordination   Initial Visit Note   01/31/2023 Name: Mozell Hardacre MRN: [id number] DOB: 17-Nov-1930  Tyrelle Raczka is a 89 y.o. year old male who sees Norm Salt, [place] for primary care. I spoke with  [name] / Docia Furl, daughter and contact for client, via phone today.  What matters to the patients health and wellness today?  Patient uses a walker to help him walk     Goals Addressed             This Visit's Progress    Patient uses a walker to help him walk       Interventions:  Spoke via phone with Docia Furl, daughter and contact for client. Spoke with [place] about client needs.  [name] said client uses a walker to help him walk.  She is hoping client can begin receiving HHPT and she has talked with PA about this request for client Client is eating well and sleeping well, per [facility]. Discussed medication procurement for client Discussed program support with RN, LCSW, Pharmacist LCSW and [name] spoke of DME for client. She said she would like to get a hospital bed for client to use. LCSW mentioned to [facility] that sometimes hospital beds can be rented on a month to month basis.  She said it is difficult financially for client at this time.   Discussed in home care.for client. [name] said she is paying for home health  aide for client as scheduled this week. Aide helps with bathing client, cooking needs, and client care support Discussed client support with NP Cameron Proud for call with LCSW today. Encouraged client or Docia Furl to call LCSW as needed for client SW support at [phone].         SDOH assessments and interventions completed:  Yes  SDOH Interventions Today    Flowsheet Row Most Recent Value  SDOH Interventions   Depression Interventions/Treatment  Medication  Physical Activity Interventions Other (Comments)  [uses a walker to help him walk]  Stress Interventions Other (Comment)  [has stress in managing daily care needs]         Care Coordination Interventions:  Yes, provided   Interventions Today    Flowsheet Row Most Recent Value  Chronic Disease   Chronic disease during today's visit Other  [spoke with Docia Furl, daughter, about client needs]  Exercise Interventions   Exercise Discussed/Reviewed Physical Activity  [uses walker to help him walk]  Education Interventions   Education Provided Provided Education  Provided Verbal Education On [employer]  [discussed program support]  Mental Health Interventions   Mental Health Discussed/Reviewed Coping Strategies  [client is taking medications as prescribed]  Nutrition Interventions   Nutrition Discussed/Reviewed Nutrition Discussed  Pharmacy Interventions   Pharmacy Dicussed/Reviewed Pharmacy Topics Discussed  Safety Interventions   Safety Discussed/Reviewed Fall Risk        Follow up plan: Follow up call scheduled for 03/08/23 at 1:00 PM     Encounter Outcome:  Pt. Visit Completed    Kelton Pillar.Handsome Anglin MSW, LCSW Licensed Visual merchandiser [facility] Care Management [phone]

## 2023-02-01 DIAGNOSIS — I129 Hypertensive chronic kidney disease with stage 1 through stage 4 chronic kidney disease, or unspecified chronic kidney disease: Secondary | ICD-10-CM | POA: Diagnosis not present

## 2023-02-01 DIAGNOSIS — D631 Anemia in chronic kidney disease: Secondary | ICD-10-CM | POA: Diagnosis not present

## 2023-02-01 DIAGNOSIS — G319 Degenerative disease of nervous system, unspecified: Secondary | ICD-10-CM | POA: Diagnosis not present

## 2023-02-01 DIAGNOSIS — N1832 Chronic kidney disease, stage 3b: Secondary | ICD-10-CM | POA: Diagnosis not present

## 2023-02-03 DIAGNOSIS — N1832 Chronic kidney disease, stage 3b: Secondary | ICD-10-CM | POA: Diagnosis not present

## 2023-02-03 DIAGNOSIS — G319 Degenerative disease of nervous system, unspecified: Secondary | ICD-10-CM | POA: Diagnosis not present

## 2023-02-03 DIAGNOSIS — I129 Hypertensive chronic kidney disease with stage 1 through stage 4 chronic kidney disease, or unspecified chronic kidney disease: Secondary | ICD-10-CM | POA: Diagnosis not present

## 2023-02-03 DIAGNOSIS — D631 Anemia in chronic kidney disease: Secondary | ICD-10-CM | POA: Diagnosis not present

## 2023-02-08 DIAGNOSIS — G319 Degenerative disease of nervous system, unspecified: Secondary | ICD-10-CM | POA: Diagnosis not present

## 2023-02-08 DIAGNOSIS — D631 Anemia in chronic kidney disease: Secondary | ICD-10-CM | POA: Diagnosis not present

## 2023-02-08 DIAGNOSIS — N1832 Chronic kidney disease, stage 3b: Secondary | ICD-10-CM | POA: Diagnosis not present

## 2023-02-08 DIAGNOSIS — I129 Hypertensive chronic kidney disease with stage 1 through stage 4 chronic kidney disease, or unspecified chronic kidney disease: Secondary | ICD-10-CM | POA: Diagnosis not present

## 2023-02-09 DIAGNOSIS — D631 Anemia in chronic kidney disease: Secondary | ICD-10-CM | POA: Diagnosis not present

## 2023-02-09 DIAGNOSIS — I129 Hypertensive chronic kidney disease with stage 1 through stage 4 chronic kidney disease, or unspecified chronic kidney disease: Secondary | ICD-10-CM | POA: Diagnosis not present

## 2023-02-09 DIAGNOSIS — G319 Degenerative disease of nervous system, unspecified: Secondary | ICD-10-CM | POA: Diagnosis not present

## 2023-02-09 DIAGNOSIS — N1832 Chronic kidney disease, stage 3b: Secondary | ICD-10-CM | POA: Diagnosis not present

## 2023-02-10 ENCOUNTER — Emergency Department (HOSPITAL_COMMUNITY)
Admission: EM | Admit: 2023-02-10 | Discharge: 2023-02-11 | Disposition: A | Discharge status: Home / Self Care | Payer: Medicare Other | Attending: Emergency Medicine | Admitting: Emergency Medicine

## 2023-02-10 ENCOUNTER — Emergency Department (HOSPITAL_COMMUNITY): Payer: Medicare Other

## 2023-02-10 DIAGNOSIS — D631 Anemia in chronic kidney disease: Secondary | ICD-10-CM | POA: Diagnosis not present

## 2023-02-10 DIAGNOSIS — R262 Difficulty in walking, not elsewhere classified: Secondary | ICD-10-CM | POA: Diagnosis not present

## 2023-02-10 DIAGNOSIS — Z743 Need for continuous supervision: Secondary | ICD-10-CM | POA: Diagnosis not present

## 2023-02-10 DIAGNOSIS — I129 Hypertensive chronic kidney disease with stage 1 through stage 4 chronic kidney disease, or unspecified chronic kidney disease: Secondary | ICD-10-CM | POA: Diagnosis not present

## 2023-02-10 DIAGNOSIS — Z7902 Long term (current) use of antithrombotics/antiplatelets: Secondary | ICD-10-CM | POA: Insufficient documentation

## 2023-02-10 DIAGNOSIS — N1832 Chronic kidney disease, stage 3b: Secondary | ICD-10-CM | POA: Diagnosis not present

## 2023-02-10 DIAGNOSIS — R531 Weakness: Secondary | ICD-10-CM | POA: Diagnosis not present

## 2023-02-10 DIAGNOSIS — G319 Degenerative disease of nervous system, unspecified: Secondary | ICD-10-CM | POA: Diagnosis not present

## 2023-02-10 LAB — URINALYSIS, ROUTINE W REFLEX MICROSCOPIC
Bilirubin Urine: NEGATIVE
Glucose, UA: NEGATIVE mg/dL
Hgb urine dipstick: NEGATIVE
Ketones, ur: NEGATIVE mg/dL
Leukocytes,Ua: NEGATIVE
Nitrite: NEGATIVE
Protein, ur: NEGATIVE mg/dL
Specific Gravity, Urine: 1.012 (ref 1.005–1.030)
pH: 6 (ref 5.0–8.0)

## 2023-02-10 LAB — BASIC METABOLIC PANEL
Anion gap: 9 (ref 5–15)
BUN: 21 mg/dL (ref 8–23)
CO2: 22 mmol/L (ref 22–32)
Calcium: 8.9 mg/dL (ref 8.9–10.3)
Chloride: 105 mmol/L (ref 98–111)
Creatinine, Ser: 1.6 mg/dL — ABNORMAL HIGH (ref 0.61–1.24)
GFR, Estimated: 41 mL/min — ABNORMAL LOW (ref >60)
Glucose, Bld: 97 mg/dL (ref 70–99)
Potassium: 4 mmol/L (ref 3.5–5.1)
Sodium: 136 mmol/L (ref 135–145)

## 2023-02-10 LAB — CBC WITH DIFFERENTIAL/PLATELET
Abs Immature Granulocytes: 0.02 10*3/uL (ref 0.00–0.07)
Basophils Absolute: 0 10*3/uL (ref 0.0–0.1)
Basophils Relative: 1 %
Eosinophils Absolute: 0.3 10*3/uL (ref 0.0–0.5)
Eosinophils Relative: 5 %
HCT: 29.8 % — ABNORMAL LOW (ref 39.0–52.0)
Hemoglobin: 9.3 g/dL — ABNORMAL LOW (ref 13.0–17.0)
Immature Granulocytes: 0 %
Lymphocytes Relative: 31 %
Lymphs Abs: 1.9 10*3/uL (ref 0.7–4.0)
MCH: 28.9 pg (ref 26.0–34.0)
MCHC: 31.2 g/dL (ref 30.0–36.0)
MCV: 92.5 fL (ref 80.0–100.0)
Monocytes Absolute: 0.6 10*3/uL (ref 0.1–1.0)
Monocytes Relative: 10 %
Neutro Abs: 3.2 10*3/uL (ref 1.7–7.7)
Neutrophils Relative %: 53 %
Platelets: 197 10*3/uL (ref 150–400)
RBC: 3.22 MIL/uL — ABNORMAL LOW (ref 4.22–5.81)
RDW: 12.6 % (ref 11.5–15.5)
WBC: 6.1 10*3/uL (ref 4.0–10.5)
nRBC: 0 % (ref 0.0–0.2)

## 2023-02-10 LAB — MAGNESIUM: Magnesium: 2 mg/dL (ref 1.7–2.4)

## 2023-02-10 MED ORDER — ASPIRIN 81 MG PO CHEW
81.0000 mg | CHEWABLE_TABLET | Freq: Once | ORAL | Status: AC
  Administered 2023-02-10: 81 mg via ORAL
  Filled 2023-02-10: qty 1

## 2023-02-10 MED ORDER — ACETAMINOPHEN 325 MG PO TABS: 650.0000 mg | ORAL_TABLET | ORAL | Status: DC | PRN

## 2023-02-10 MED ORDER — MEMANTINE HCL 10 MG PO TABS
10.0000 mg | ORAL_TABLET | Freq: Two times a day (BID) | ORAL | Status: DC
  Administered 2023-02-10 – 2023-02-11 (×3): 10 mg via ORAL
  Filled 2023-02-10 (×3): qty 1

## 2023-02-10 MED ORDER — LEVETIRACETAM 500 MG PO TABS
500.0000 mg | ORAL_TABLET | Freq: Two times a day (BID) | ORAL | Status: DC
  Administered 2023-02-10 – 2023-02-11 (×3): 500 mg via ORAL
  Filled 2023-02-10 (×3): qty 1

## 2023-02-10 NOTE — ED Provider Notes (Signed)
Care of patient received from prior provider at 4:17 PM, please see their note for complete H/P and care plan.  Received handoff per ED course.  Clinical Course as of 02/10/23 1617  Thu Feb 10, 2023  1616 Stable HO from [facility] Significant underlying medical history. Has relied on sister for ADLs. AMS workup in process. TOC PT and SW consult.   [CC]    Clinical Course User Index [CC] Glyn Ade, MD    Reassessment: Medical clearance evaluation ordered by Dr. Wilkie Aye nondiagnostic.  Patient continues to be weak.  Per Dr. Wilkie Aye, she was able to talk to the daughter at bedside prior to signout.  The plan is for transitions of care consultation in a.m. with physical therapy evaluation for likely placement given progressive failure to thrive and unsafe care environment at home per family.      Glyn Ade, MD 02/10/23 2241

## 2023-02-10 NOTE — Progress Notes (Signed)
Transition of Care [facility]) - Emergency Department Mini Assessment   Patient Details  Name: Carlos Shields MRN: [id number] Date of Birth: [birth year]  Transition of Care [facility]) CM/SW Contact:    Georgie Chard, LCSW Phone Number: 02/10/2023, 9:16 PM   Clinical Narrative: CSW spoke to the patient's daughter [name] at this time she states that she is currently in a cask and can not assist the dad that he is weak and needs short term rehab to rebuild his strength. This CSW obtained patient's PASRR as well has started the FL2. This CSW has reached out to provider to inquire about PT/OT orders which are in. TOC will follow up with patient and family after evaluations are complete.    ED Mini Assessment: What brought you to the Emergency Department? : (P) Patient is weak and can not use walker  Barriers to Discharge: (P) No Barriers Identified  Barrier interventions: (P) Needs PT/ OT eval     Interventions which prevented an admission or readmission: SNF Placement    Patient Contact and Communications     Spoke with: (P) [place]  ,                 Admission diagnosis:  Weakness Patient Active Problem List   Diagnosis Date Noted   Mild vascular dementia (HCC) 10/18/2022   CVA (cerebrovascular accident) (HCC) 08/14/2022   Acute-on-chronic kidney injury (HCC) 08/05/2022   CVA (cerebral vascular accident) (HCC) 08/05/2022   Dysphagia 08/05/2022   Abnormal TSH 08/05/2022   Acute metabolic encephalopathy 08/05/2022   CKD (chronic kidney disease), stage III (HCC) 04/26/2021   Weakness generalized 04/26/2021   Weakness 04/25/2021   Syncope 01/13/2012   Anemia 12/09/2011   Seizures (HCC) 12/08/2011   HTN (hypertension) 12/08/2011   Encephalopathy 12/08/2011   PCP:  Norm Salt, PA Pharmacy:   [facility] [phone] Gin[place] Otto, [place] - [address] AT [facility] [address] [place] [address] Phone: [phone] Fax: [phone]  [name] [address] [place] [address] Phone: [phone] Fax: [phone]
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
## 2023-02-10 NOTE — ED Triage Notes (Signed)
TRIAGE NOTE:   Patient arrives by EMS from with c/o weakness.  Per family patient has been unable to walk with walker for 3 weeks.

## 2023-02-11 DIAGNOSIS — I6529 Occlusion and stenosis of unspecified carotid artery: Secondary | ICD-10-CM | POA: Diagnosis present

## 2023-02-11 DIAGNOSIS — R531 Weakness: Secondary | ICD-10-CM | POA: Diagnosis not present

## 2023-02-11 DIAGNOSIS — M6281 Muscle weakness (generalized): Secondary | ICD-10-CM | POA: Diagnosis not present

## 2023-02-11 DIAGNOSIS — I872 Venous insufficiency (chronic) (peripheral): Secondary | ICD-10-CM | POA: Diagnosis not present

## 2023-02-11 DIAGNOSIS — Z7902 Long term (current) use of antithrombotics/antiplatelets: Secondary | ICD-10-CM | POA: Diagnosis not present

## 2023-02-11 DIAGNOSIS — R569 Unspecified convulsions: Secondary | ICD-10-CM | POA: Diagnosis not present

## 2023-02-11 DIAGNOSIS — R262 Difficulty in walking, not elsewhere classified: Secondary | ICD-10-CM | POA: Diagnosis not present

## 2023-02-11 DIAGNOSIS — F01A4 Vascular dementia, mild, with anxiety: Secondary | ICD-10-CM | POA: Diagnosis not present

## 2023-02-11 DIAGNOSIS — R1310 Dysphagia, unspecified: Secondary | ICD-10-CM | POA: Diagnosis not present

## 2023-02-11 DIAGNOSIS — I10 Essential (primary) hypertension: Secondary | ICD-10-CM | POA: Diagnosis not present

## 2023-02-11 DIAGNOSIS — I129 Hypertensive chronic kidney disease with stage 1 through stage 4 chronic kidney disease, or unspecified chronic kidney disease: Secondary | ICD-10-CM | POA: Diagnosis not present

## 2023-02-11 DIAGNOSIS — I69391 Dysphagia following cerebral infarction: Secondary | ICD-10-CM | POA: Diagnosis not present

## 2023-02-11 DIAGNOSIS — N1831 Chronic kidney disease, stage 3a: Secondary | ICD-10-CM | POA: Diagnosis not present

## 2023-02-11 DIAGNOSIS — D631 Anemia in chronic kidney disease: Secondary | ICD-10-CM | POA: Diagnosis not present

## 2023-02-11 DIAGNOSIS — K219 Gastro-esophageal reflux disease without esophagitis: Secondary | ICD-10-CM | POA: Diagnosis not present

## 2023-02-11 DIAGNOSIS — R2689 Other abnormalities of gait and mobility: Secondary | ICD-10-CM | POA: Diagnosis not present

## 2023-02-11 DIAGNOSIS — Z7401 Bed confinement status: Secondary | ICD-10-CM | POA: Diagnosis not present

## 2023-02-11 DIAGNOSIS — I69351 Hemiplegia and hemiparesis following cerebral infarction affecting right dominant side: Secondary | ICD-10-CM | POA: Diagnosis not present

## 2023-02-11 DIAGNOSIS — R404 Transient alteration of awareness: Secondary | ICD-10-CM | POA: Diagnosis not present

## 2023-02-11 DIAGNOSIS — E119 Type 2 diabetes mellitus without complications: Secondary | ICD-10-CM | POA: Diagnosis not present

## 2023-02-11 MED ORDER — METOPROLOL TARTRATE 25 MG PO TABS
12.5000 mg | ORAL_TABLET | Freq: Two times a day (BID) | ORAL | Status: DC
  Administered 2023-02-11 (×2): 12.5 mg via ORAL
  Filled 2023-02-11 (×2): qty 1

## 2023-02-11 MED ORDER — CLOPIDOGREL BISULFATE 75 MG PO TABS
75.0000 mg | ORAL_TABLET | Freq: Every day | ORAL | Status: DC
  Administered 2023-02-11: 75 mg via ORAL
  Filled 2023-02-11: qty 1

## 2023-02-11 MED ORDER — PANTOPRAZOLE SODIUM 40 MG PO TBEC
40.0000 mg | DELAYED_RELEASE_TABLET | Freq: Every day | ORAL | Status: DC
  Administered 2023-02-11: 40 mg via ORAL
  Filled 2023-02-11: qty 1

## 2023-02-11 MED ORDER — AMLODIPINE BESYLATE 5 MG PO TABS
5.0000 mg | ORAL_TABLET | Freq: Every day | ORAL | Status: DC
  Administered 2023-02-11: 5 mg via ORAL
  Filled 2023-02-11: qty 1

## 2023-02-11 MED ORDER — ATORVASTATIN CALCIUM 40 MG PO TABS
40.0000 mg | ORAL_TABLET | Freq: Every day | ORAL | Status: DC
  Administered 2023-02-11: 40 mg via ORAL
  Filled 2023-02-11: qty 1

## 2023-02-11 MED ORDER — ASPIRIN 81 MG PO TBEC
81.0000 mg | DELAYED_RELEASE_TABLET | Freq: Every day | ORAL | Status: DC
  Administered 2023-02-11: 81 mg via ORAL
  Filled 2023-02-11: qty 1

## 2023-02-11 MED ORDER — ACETAMINOPHEN 325 MG PO TABS: 325.0000 mg | ORAL_TABLET | ORAL | Status: DC | PRN

## 2023-02-11 NOTE — ED Notes (Signed)
Shift report received, assumed care of patient at this time 

## 2023-02-11 NOTE — ED Provider Notes (Addendum)
Emergency Medicine Observation Re-evaluation Note  Carlos Shields is a 89 y.o. male, seen on rounds today.  Pt initially presented to the ED for complaints of Weakness Currently, the patient is awake eating breakfast.  Physical Exam  BP (!) 172/92   Pulse (!) 58   Temp 97.7 F (36.5 C)   Resp 12   SpO2 99%  Physical Exam General: Awake, no acute distress Cardiac: Regular rate Lungs: No increased work of breathing Psych: Calm, cooperative  ED Course / MDM  EKG:EKG Interpretation Date/Time:  Thursday February 10 2023 18:00:06 EDT Ventricular Rate:  60 PR Interval:  194 QRS Duration:  86 QT Interval:  477 QTC Calculation: 477 R Axis:   -34  Text Interpretation: Sinus rhythm Left axis deviation Nonspecific T abnormalities, lateral leads Borderline prolonged QT interval Confirmed by Glyn Ade [phone]) on 02/10/2023 8:03:50 PM  I have reviewed the labs performed to date as well as medications administered while in observation.  Recent changes in the last 24 hours include medically cleared, pending SNF placement.  Plan  Current plan is for SNF, pending acceptance.  Clinical Course as of 02/11/23 1443  Thu Feb 10, 2023  1616 Stable HO from [facility] Significant underlying medical history. Has relied on sister for ADLs. AMS workup in process. TOC PT and SW consult.   [CC]  Fri Feb 11, 2023  1442 Patient accepted to [facility] [VK]    Clinical Course User Index [CC] Glyn Ade, MD [VK] Rexford Maus, DO       Rexford Maus, [place] 02/11/23 [phone]

## 2023-02-11 NOTE — Progress Notes (Signed)
CSW spoke with patients daughter Docia Furl [phone]. Patients daughter accepted the bed offer at [facility]. CSW provided [place] with the medicare.gov ratings. CSW also spoke with [facility] in admissions at [facility] who confirmed she can take patient. CSW started patients insurance authorization in [place] [phone])

## 2023-02-11 NOTE — [place] FL2 (Signed)
[place] MEDICAID FL2 LEVEL OF CARE FORM     IDENTIFICATION  Patient Name: Carlos Shields Birthdate: 07/09/1931 Sex: male Admission Date (Current Location): 02/10/2023  [facility] and [place] Number:  Producer, television/film/video and Address:  [facility] [place], [address] [place], [place] [address]      Provider Number: [id number]  Attending Physician Name and Address:  Default, Provider, MD  Relative Name and Phone Number:  Docia Furl [phone] Daughter    Current Level of Care: Hospital Recommended Level of Care: Skilled Nursing Facility Prior Approval Number:    Date Approved/Denied: 02/10/23 PASRR Number: [id number]  Discharge Plan: SNF    Current Diagnoses: Patient Active Problem List   Diagnosis Date Noted   Mild vascular dementia (HCC) 10/18/2022   CVA (cerebrovascular accident) (HCC) 08/14/2022   Acute-on-chronic kidney injury (HCC) 08/05/2022   CVA (cerebral vascular accident) (HCC) 08/05/2022   Dysphagia 08/05/2022   Abnormal TSH 08/05/2022   Acute metabolic encephalopathy 08/05/2022   CKD (chronic kidney disease), stage III (HCC) 04/26/2021   Weakness generalized 04/26/2021   Weakness 04/25/2021   Syncope 01/13/2012   Anemia 12/09/2011   Seizures (HCC) 12/08/2011   HTN (hypertension) 12/08/2011   Encephalopathy 12/08/2011    Orientation RESPIRATION BLADDER Height & Weight     Self, Place  Normal Incontinent Weight: 119 lbs  Height:  5'4   BEHAVIORAL SYMPTOMS/MOOD NEUROLOGICAL BOWEL NUTRITION STATUS      Incontinent Diet (Regular)  AMBULATORY STATUS COMMUNICATION OF NEEDS Skin   Extensive Assist Verbally Normal                       Personal Care Assistance Level of Assistance  Bathing, Feeding, Dressing Bathing Assistance: Maximum assistance Feeding assistance: Limited assistance Dressing Assistance: Maximum assistance     Functional Limitations Info  Sight, Hearing, Speech Sight Info: Adequate Hearing Info:  Adequate Speech Info: Adequate    SPECIAL CARE FACTORS FREQUENCY                       Contractures Contractures Info: Not present    Additional Factors Info  Code Status, Allergies Code Status Info: Full Allergies Info: Not on file           Current Medications (02/11/2023):  This is the current hospital active medication list Current Facility-Administered Medications  Medication Dose Route Frequency Provider Last Rate Last Admin   acetaminophen (TYLENOL) tablet 650 mg  650 mg Oral Q4H PRN Glyn Ade, MD       amLODipine (NORVASC) tablet 5 mg  5 mg Oral Daily Kingsley, Victoria K, DO       aspirin EC tablet 81 mg  81 mg Oral Daily Kingsley, Victoria K, DO       atorvastatin (LIPITOR) tablet 40 mg  40 mg Oral Daily Kingsley, Victoria K, DO       clopidogrel (PLAVIX) tablet 75 mg  75 mg Oral Daily Kingsley, Victoria K, DO       levETIRAcetam (KEPPRA) tablet 500 mg  500 mg Oral BID Glyn Ade, MD   500 mg at 02/10/23 2242   memantine (NAMENDA) tablet 10 mg  10 mg Oral BID Glyn Ade, MD   10 mg at 02/10/23 2242   metoprolol tartrate (LOPRESSOR) tablet 12.5 mg  12.5 mg Oral BID Elayne Snare K, DO       pantoprazole (PROTONIX) EC tablet 40 mg  40 mg Oral Daily Rexford Maus,  DO       Current Outpatient Medications  Medication Sig Dispense Refill   acetaminophen (TYLENOL) 325 MG tablet Take 1-2 tablets (325-650 mg total) by mouth every 4 (four) hours as needed for mild pain.     amLODipine (NORVASC) 5 MG tablet Take 1 tablet (5 mg total) by mouth daily. 30 tablet 0   aspirin EC 81 MG tablet Take 81 mg by mouth daily. Swallow whole.     atorvastatin (LIPITOR) 40 MG tablet Take 40 mg by mouth daily.     clopidogrel (PLAVIX) 75 MG tablet Take 1 tablet (75 mg total) by mouth daily. 30 tablet 0   levETIRAcetam (KEPPRA) 500 MG tablet Take 1 tablet (500 mg total) by mouth 2 (two) times daily. 60 tablet 6   memantine (NAMENDA) 10 MG tablet Take 1  tablet (10 mg total) by mouth 2 (two) times daily. 180 tablet 3   metoprolol tartrate (LOPRESSOR) 25 MG tablet Take 0.5 tablets (12.5 mg total) by mouth 2 (two) times daily. 60 tablet 0   pantoprazole (PROTONIX) 40 MG tablet Take 1 tablet (40 mg total) by mouth daily. 30 tablet 0     Discharge Medications: Please see discharge summary for a list of discharge medications.  Relevant Imaging Results:  Relevant Lab Results:   Additional Information SSN # [id number]  Susa Simmonds, LCSWA

## 2023-02-11 NOTE — ED Notes (Signed)
Patient cleaned of incontinent brief, patient now clean and dry, denies need at this time.

## 2023-02-11 NOTE — Discharge Instructions (Signed)
You were seen in the emergency department for your weakness.  You had no signs of any infection and you were evaluated by physical therapy and recommended to go to rehab.  You have been accepted to [facility].  You can follow-up with your primary doctor to have your symptoms rechecked and can return to the emergency department for any new or worsening symptoms.

## 2023-02-11 NOTE — ED Notes (Signed)
This RN moved patient from room 2 to room 49.This RN cleaned patient of incontinence and changed patient linins. Patient clean and dry at this time.

## 2023-02-11 NOTE — Progress Notes (Signed)
Patient is discharging to [facility]. Patient is going to room 425W. Number for report is [phone]. Patients RN and ED provider notified.

## 2023-02-11 NOTE — ED Notes (Signed)
Assumed pt care at this time
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
## 2023-02-11 NOTE — ED Notes (Addendum)
Pt calm and resting at this time and in NAD

## 2023-02-14 DIAGNOSIS — I69351 Hemiplegia and hemiparesis following cerebral infarction affecting right dominant side: Secondary | ICD-10-CM | POA: Diagnosis not present

## 2023-02-14 DIAGNOSIS — I69391 Dysphagia following cerebral infarction: Secondary | ICD-10-CM | POA: Diagnosis not present

## 2023-02-14 DIAGNOSIS — R569 Unspecified convulsions: Secondary | ICD-10-CM | POA: Diagnosis not present

## 2023-02-14 DIAGNOSIS — F01A4 Vascular dementia, mild, with anxiety: Secondary | ICD-10-CM | POA: Diagnosis not present

## 2023-02-14 DIAGNOSIS — R2689 Other abnormalities of gait and mobility: Secondary | ICD-10-CM | POA: Diagnosis not present

## 2023-02-14 DIAGNOSIS — M6281 Muscle weakness (generalized): Secondary | ICD-10-CM | POA: Diagnosis not present

## 2023-02-15 DIAGNOSIS — K219 Gastro-esophageal reflux disease without esophagitis: Secondary | ICD-10-CM | POA: Diagnosis not present

## 2023-02-15 DIAGNOSIS — I10 Essential (primary) hypertension: Secondary | ICD-10-CM | POA: Diagnosis not present

## 2023-02-15 DIAGNOSIS — F01A4 Vascular dementia, mild, with anxiety: Secondary | ICD-10-CM | POA: Diagnosis not present

## 2023-02-17 ENCOUNTER — Other Ambulatory Visit: Payer: Self-pay | Admitting: *Deleted

## 2023-02-17 DIAGNOSIS — R2689 Other abnormalities of gait and mobility: Secondary | ICD-10-CM | POA: Diagnosis not present

## 2023-02-17 DIAGNOSIS — M6281 Muscle weakness (generalized): Secondary | ICD-10-CM | POA: Diagnosis not present

## 2023-02-17 DIAGNOSIS — R569 Unspecified convulsions: Secondary | ICD-10-CM | POA: Diagnosis not present

## 2023-02-17 DIAGNOSIS — I69351 Hemiplegia and hemiparesis following cerebral infarction affecting right dominant side: Secondary | ICD-10-CM | POA: Diagnosis not present

## 2023-02-17 DIAGNOSIS — I69391 Dysphagia following cerebral infarction: Secondary | ICD-10-CM | POA: Diagnosis not present

## 2023-02-17 NOTE — Patient Outreach (Signed)
Late entry for 02/16/23  Mr. Turay resides in [facility] skilled nursing facility. Screening for potential care coordination services as benefit of health plan and Primary Care Provider. Mr. Trojanowski was active with care coordination team prior.  Collaboration with [facility] [place] Child psychotherapist. Mr. Latsko lives with daughter. Transition plans pending.   Will continue to follow and plan outreach to daughter [name].   Raiford Noble, MSN, RN,BSN [facility] Post Acute Care Coordinator [phone] (Direct dial)

## 2023-02-21 DIAGNOSIS — I69391 Dysphagia following cerebral infarction: Secondary | ICD-10-CM | POA: Diagnosis not present

## 2023-02-21 DIAGNOSIS — R569 Unspecified convulsions: Secondary | ICD-10-CM | POA: Diagnosis not present

## 2023-02-21 DIAGNOSIS — F01A4 Vascular dementia, mild, with anxiety: Secondary | ICD-10-CM | POA: Diagnosis not present

## 2023-02-21 DIAGNOSIS — I69351 Hemiplegia and hemiparesis following cerebral infarction affecting right dominant side: Secondary | ICD-10-CM | POA: Diagnosis not present

## 2023-02-21 DIAGNOSIS — N1831 Chronic kidney disease, stage 3a: Secondary | ICD-10-CM | POA: Diagnosis not present

## 2023-02-21 DIAGNOSIS — M6281 Muscle weakness (generalized): Secondary | ICD-10-CM | POA: Diagnosis not present

## 2023-02-21 DIAGNOSIS — I10 Essential (primary) hypertension: Secondary | ICD-10-CM | POA: Diagnosis not present

## 2023-02-21 DIAGNOSIS — R2689 Other abnormalities of gait and mobility: Secondary | ICD-10-CM | POA: Diagnosis not present

## 2023-02-24 DIAGNOSIS — R2689 Other abnormalities of gait and mobility: Secondary | ICD-10-CM | POA: Diagnosis not present

## 2023-02-24 DIAGNOSIS — R569 Unspecified convulsions: Secondary | ICD-10-CM | POA: Diagnosis not present

## 2023-02-24 DIAGNOSIS — M6281 Muscle weakness (generalized): Secondary | ICD-10-CM | POA: Diagnosis not present

## 2023-02-24 DIAGNOSIS — I69351 Hemiplegia and hemiparesis following cerebral infarction affecting right dominant side: Secondary | ICD-10-CM | POA: Diagnosis not present

## 2023-02-24 DIAGNOSIS — I69391 Dysphagia following cerebral infarction: Secondary | ICD-10-CM | POA: Diagnosis not present

## 2023-02-28 DIAGNOSIS — I69351 Hemiplegia and hemiparesis following cerebral infarction affecting right dominant side: Secondary | ICD-10-CM | POA: Diagnosis not present

## 2023-02-28 DIAGNOSIS — R569 Unspecified convulsions: Secondary | ICD-10-CM | POA: Diagnosis not present

## 2023-02-28 DIAGNOSIS — I10 Essential (primary) hypertension: Secondary | ICD-10-CM | POA: Diagnosis not present

## 2023-02-28 DIAGNOSIS — M6281 Muscle weakness (generalized): Secondary | ICD-10-CM | POA: Diagnosis not present

## 2023-02-28 DIAGNOSIS — I69391 Dysphagia following cerebral infarction: Secondary | ICD-10-CM | POA: Diagnosis not present

## 2023-02-28 DIAGNOSIS — R2689 Other abnormalities of gait and mobility: Secondary | ICD-10-CM | POA: Diagnosis not present

## 2023-02-28 DIAGNOSIS — F01A4 Vascular dementia, mild, with anxiety: Secondary | ICD-10-CM | POA: Diagnosis not present

## 2023-02-28 DIAGNOSIS — N1831 Chronic kidney disease, stage 3a: Secondary | ICD-10-CM | POA: Diagnosis not present

## 2023-03-03 DIAGNOSIS — M6281 Muscle weakness (generalized): Secondary | ICD-10-CM | POA: Diagnosis not present

## 2023-03-03 DIAGNOSIS — I69351 Hemiplegia and hemiparesis following cerebral infarction affecting right dominant side: Secondary | ICD-10-CM | POA: Diagnosis not present

## 2023-03-03 DIAGNOSIS — R2689 Other abnormalities of gait and mobility: Secondary | ICD-10-CM | POA: Diagnosis not present

## 2023-03-03 DIAGNOSIS — R569 Unspecified convulsions: Secondary | ICD-10-CM | POA: Diagnosis not present

## 2023-03-03 DIAGNOSIS — I69391 Dysphagia following cerebral infarction: Secondary | ICD-10-CM | POA: Diagnosis not present

## 2023-03-04 DIAGNOSIS — I69351 Hemiplegia and hemiparesis following cerebral infarction affecting right dominant side: Secondary | ICD-10-CM | POA: Diagnosis not present

## 2023-03-04 DIAGNOSIS — I10 Essential (primary) hypertension: Secondary | ICD-10-CM | POA: Diagnosis not present

## 2023-03-07 DIAGNOSIS — I69351 Hemiplegia and hemiparesis following cerebral infarction affecting right dominant side: Secondary | ICD-10-CM | POA: Diagnosis not present

## 2023-03-07 DIAGNOSIS — I69391 Dysphagia following cerebral infarction: Secondary | ICD-10-CM | POA: Diagnosis not present

## 2023-03-07 DIAGNOSIS — M6281 Muscle weakness (generalized): Secondary | ICD-10-CM | POA: Diagnosis not present

## 2023-03-07 DIAGNOSIS — R569 Unspecified convulsions: Secondary | ICD-10-CM | POA: Diagnosis not present

## 2023-03-07 DIAGNOSIS — R2689 Other abnormalities of gait and mobility: Secondary | ICD-10-CM | POA: Diagnosis not present

## 2023-03-08 ENCOUNTER — Ambulatory Visit: Payer: Self-pay | Admitting: Licensed Clinical Social Worker

## 2023-03-08 NOTE — Patient Outreach (Signed)
  Care Coordination   Follow Up Visit Note   03/08/2023 Name: Carlos Shields MRN: [id number] DOB: Jun 10, 1931  Carlos Shields is a 89 y.o. year old male who sees Carlos Shields, [place] for primary care. I spoke with  Carlos Shields / Carlos Shields, daughter of patient via phone today.  What matters to the patients health and wellness today?   Patient is receiving care currently at [facility].  He receives physical therapy sessions as scheduled     Goals Addressed             This Visit's Progress    Patient is receiving care currentl at SNF. He receives physical therapy sessions as scheduled       Interventions:  Spoke with Carlos Shields, daughter of client, about client needs Discussed program support Discussed pain issues of client . Discussed sleeping issues of client Discussed ambulation of client. Client is receiving physical therapy sessions as scheduled at SNF Discussed appetite of client. Discussed family support for client. Carlos Shields said family members are visiting client regularly at [facility] said plan is eventually for client to be strong enough to return home with supports in place Discussed discharge planning for client. Encouraged Carlos Shields to speak with social worker at SNF related to discharge planning for client and to possibly talk with SW about equipment needs for client when he returns home. Carlos Shields thinks clent will need a wheelchair and a hospital bed at time of client discharge  Carlos Shields for phone call with LCSW today Encouraged client or Carlos Shields to call LCSW as needed for SW support for client at [phone].          SDOH assessments and interventions completed:  Yes  SDOH Interventions Today    Flowsheet Row Most Recent Value  SDOH Interventions   Depression Interventions/Treatment  Medication  Physical Activity Interventions Other (Comments)  [receives physical therapy sessions as schedled at SNF]  Stress Interventions Other (Comment)  [has stress in  managing medical needs]        Care Coordination Interventions:  Yes, provided    Interventions Today    Flowsheet Row Most Recent Value  Chronic Disease   Chronic disease during today's visit Other  [spoke with Carlos Shields, daughter of client, about client needs]  General Interventions   General Interventions Discussed/Reviewed General Interventions Discussed, Community Resources  Exercise Interventions   Exercise Discussed/Reviewed Physical Activity  [client receives physical therapy sessions as scheduled at SNF]  Education Interventions   Education Provided Provided Education  Provided Verbal Education On [employer]  [discussed discharge planning  for client from SNF when client is able to prepare for discharge]  Mental Health Interventions   Mental Health Discussed/Reviewed Coping Strategies  [no mood issues noted]  Nutrition Interventions   Nutrition Discussed/Reviewed Nutrition Discussed  Pharmacy Interventions   Pharmacy Dicussed/Reviewed Pharmacy Topics Discussed       Follow up plan: Follow up call scheduled for 05/02/23 at 2:00 PM     Encounter Outcome:  Pt. Visit Completed   Carlos Shields.Carlos Shields MSW, LCSW Licensed Visual merchandiser [facility] Care Management [phone]

## 2023-03-08 NOTE — Patient Instructions (Signed)
Visit Information  Thank you for taking time to visit with me today. Please don't hesitate to contact me if I can be of assistance to you.   Following are the goals we discussed today:   Goals Addressed             This Visit's Progress    Patient is receiving care currentl at SNF. He receives physical therapy sessions as scheduled       Interventions:  Spoke with Docia Furl, daughter of client, about client needs Discussed program support Discussed pain issues of client . Discussed sleeping issues of client Discussed ambulation of client. Client is receiving physical therapy sessions as scheduled at SNF Discussed appetite of client. Discussed family support for client. [name] said family members are visiting client regularly at [facility] said plan is eventually for client to be strong enough to return home with supports in place Discussed discharge planning for client. Encouraged [name] to speak with social worker at SNF related to discharge planning for client and to possibly talk with SW about equipment needs for client when he returns home. [name] thinks clent will need a wheelchair and a hospital bed at time of client discharge  Cameron Proud for phone call with LCSW today Encouraged client or Docia Furl to call LCSW as needed for SW support for client at [phone].          Our next appointment is by telephone on 05/02/23 at 2:00 PM   Please call the care guide team at [phone] if you need to cancel or reschedule your appointment.   If you are experiencing a Mental Health or Behavioral Health Crisis or need someone to talk to, please go to [facility] [address], [place] [phone])   The patient/ Docia Furl, daughter,  verbalized understanding of instructions, educational materials, and care plan provided today and DECLINED offer to receive copy of patient instructions, educational materials, and care plan.    The patient / Docia Furl, daughter, has been provided with contact information for the care management team and has been advised to call with any health related questions or concerns.   Kelton Pillar.Cayleb Jarnigan MSW, LCSW Licensed Visual merchandiser [facility] Care Management [phone]

## 2023-03-10 DIAGNOSIS — M6281 Muscle weakness (generalized): Secondary | ICD-10-CM | POA: Diagnosis not present

## 2023-03-10 DIAGNOSIS — I69391 Dysphagia following cerebral infarction: Secondary | ICD-10-CM | POA: Diagnosis not present

## 2023-03-10 DIAGNOSIS — R569 Unspecified convulsions: Secondary | ICD-10-CM | POA: Diagnosis not present

## 2023-03-10 DIAGNOSIS — R2689 Other abnormalities of gait and mobility: Secondary | ICD-10-CM | POA: Diagnosis not present

## 2023-03-10 DIAGNOSIS — I69351 Hemiplegia and hemiparesis following cerebral infarction affecting right dominant side: Secondary | ICD-10-CM | POA: Diagnosis not present

## 2023-03-16 ENCOUNTER — Other Ambulatory Visit: Payer: Self-pay | Admitting: *Deleted

## 2023-03-16 ENCOUNTER — Ambulatory Visit (INDEPENDENT_AMBULATORY_CARE_PROVIDER_SITE_OTHER): Payer: Medicare Other | Admitting: Physician Assistant

## 2023-03-16 ENCOUNTER — Ambulatory Visit (HOSPITAL_COMMUNITY)
Admission: RE | Admit: 2023-03-16 | Discharge: 2023-03-16 | Disposition: A | Discharge status: Home / Self Care | Payer: Medicare Other | Source: Ambulatory Visit | Attending: Vascular Surgery | Admitting: Vascular Surgery

## 2023-03-16 VITALS — BP 106/64 | HR 69 | Temp 98.4°F | Resp 18 | Ht 64.0 in | Wt 119.0 lb

## 2023-03-16 DIAGNOSIS — I6529 Occlusion and stenosis of unspecified carotid artery: Secondary | ICD-10-CM

## 2023-03-16 NOTE — Progress Notes (Signed)
Office Note     CC:  follow up Requesting Provider:  Norm Salt, PA  HPI: Carlos Shields is a 89 y.o. ([birth year]) male who presents for surveillance of carotid artery stenosis.  He is status post left TCAR for symptomatic left ICA stenosis.  Since last office visit he was hospitalized for seizure disorder.  He however denies any further strokelike symptoms.  He continues to take his aspirin, Plavix, statin daily.  He is accompanied by his daughter today.  He currently resides at an assisted living facility in [place].   Past Medical History:  Diagnosis Date   CKD (chronic kidney disease) stage 3, GFR 30-59 ml/min (HCC)    Gait abnormality    Hypertension    Pancreatitis    Seizures (HCC)    Venous insufficiency     Past Surgical History:  Procedure Laterality Date   HERNIA REPAIR     TRANSCAROTID ARTERY REVASCULARIZATION  Left 08/12/2022   Procedure: Transcarotid Artery Revascularization;  Surgeon: Maeola Harman, MD;  Location: [facility] OR;  Service: Vascular;  Laterality: Left;    Social History   Socioeconomic History   Marital status: Divorced    Spouse name: Not on file   Number of children: 2   Years of education: Not on file   Highest education level: Not on file  Occupational History   Occupation: retired  Tobacco Use   Smoking status: Former    Types: Cigars   Smokeless tobacco: Never  Vaping Use   Vaping status: Never Used  Substance and Sexual Activity   Alcohol use: No   Drug use: No   Sexual activity: Never  Other Topics Concern   Not on file  Social History Narrative   Not on file   Social Determinants of Health   Financial Resource Strain: Not on file  Food Insecurity: No Food Insecurity (08/06/2022)   Hunger Vital Sign    Worried About Running Out of Food in the Last Year: Never true    Ran Out of Food in the Last Year: Never true  Transportation Needs: No Transportation Needs (08/06/2022)   PRAPARE - Scientist, research (physical sciences) (Medical): No    Lack of Transportation (Non-Medical): No  Physical Activity: Insufficiently Active (03/08/2023)   Exercise Vital Sign    Days of Exercise per Week: 3 days    Minutes of Exercise per Session: 10 min  Stress: Stress Concern Present (03/08/2023)   [employer] of Occupational Health - Occupational Stress Questionnaire    Feeling of Stress : To some extent  Social Connections: Not on file  Intimate Partner Violence: Not At Risk (08/06/2022)   Humiliation, Afraid, Rape, and Kick questionnaire    Fear of Current or Ex-Partner: No    Emotionally Abused: No    Physically Abused: No    Sexually Abused: No    Family History  Problem Relation Age of Onset   Hypertension Other    Diabetes Other    Asthma Other     Current Outpatient Medications  Medication Sig Dispense Refill   acetaminophen (TYLENOL) 325 MG tablet Take 1-2 tablets (325-650 mg total) by mouth every 4 (four) hours as needed for mild pain.     amLODipine (NORVASC) 5 MG tablet Take 1 tablet (5 mg total) by mouth daily. 30 tablet 0   aspirin EC 81 MG tablet Take 81 mg by mouth daily. Swallow whole.     atorvastatin (LIPITOR) 40 MG tablet Take 40  mg by mouth daily.     clopidogrel (PLAVIX) 75 MG tablet Take 1 tablet (75 mg total) by mouth daily. 30 tablet 0   levETIRAcetam (KEPPRA) 500 MG tablet Take 1 tablet (500 mg total) by mouth 2 (two) times daily. 60 tablet 6   memantine (NAMENDA) 10 MG tablet Take 1 tablet (10 mg total) by mouth 2 (two) times daily. 180 tablet 3   metoprolol tartrate (LOPRESSOR) 25 MG tablet Take 0.5 tablets (12.5 mg total) by mouth 2 (two) times daily. 60 tablet 0   pantoprazole (PROTONIX) 40 MG tablet Take 1 tablet (40 mg total) by mouth daily. 30 tablet 0   No current facility-administered medications for this visit.    No Known Allergies   REVIEW OF SYSTEMS:   [X]  denotes positive finding, [ ]  denotes negative finding Cardiac  Comments:  Chest pain or chest  pressure:    Shortness of breath upon exertion:    Short of breath when lying flat:    Irregular heart rhythm:        Vascular    Pain in calf, thigh, or hip brought on by ambulation:    Pain in feet at night that wakes you up from your sleep:     Blood clot in your veins:    Leg swelling:         Pulmonary    Oxygen at home:    Productive cough:     Wheezing:         Neurologic    Sudden weakness in arms or legs:     Sudden numbness in arms or legs:     Sudden onset of difficulty speaking or slurred speech:    Temporary loss of vision in one eye:     Problems with dizziness:         Gastrointestinal    Blood in stool:     Vomited blood:         Genitourinary    Burning when urinating:     Blood in urine:        Psychiatric    Major depression:         Hematologic    Bleeding problems:    Problems with blood clotting too easily:        Skin    Rashes or ulcers:        Constitutional    Fever or chills:      PHYSICAL EXAMINATION:  Vitals:   03/16/23 1330 03/16/23 1332  BP: 116/70 106/64  Pulse: 69   Resp: 18   Temp: 98.4 F (36.9 C)   TempSrc: Temporal   SpO2: 94%   Weight: 119 lb (54 kg)   Height: 5\' 4"  (1.626 m)     General:  WDWN in NAD; vital signs documented above Gait: Not observed HENT: WNL, normocephalic Pulmonary: normal non-labored breathing , without Rales, rhonchi,  wheezing Cardiac: regular HR Abdomen: soft, NT, no masses Skin: without rashes Vascular Exam/Pulses: palpable radial pulses Extremities: without ischemic changes, without Gangrene , without cellulitis; without open wounds;  Musculoskeletal: no muscle wasting or atrophy  Neurologic: A&O X 3; CN grossly intact Psychiatric:  The pt has Normal affect.   Non-Invasive Vascular Imaging:   Right ICA 40 to 59% Left ICA mid stent 183/52    ASSESSMENT/PLAN:: 89 y.o. male here for follow up for surveillance of carotid artery stenosis  -Subjectively he has not had any further  right arm weakness, or other strokelike symptoms.  Carotid  duplex demonstrates 40 to 59% stenosis of the right ICA and a patent left ICA stent with peak systolic velocity velocity of 409WJ/X through the mid stent.  There is a diastolic velocity of 52 cm/s.  He will continue his aspirin, Plavix, statin daily.  We will repeat carotid duplex in 6 months.  If duplex appears stable at that time we can repeat on an annual basis.   Emilie Rutter, PA-C [facility] [phone]  Clinic MD:   Randie Heinz

## 2023-03-16 NOTE — Patient Outreach (Signed)
Post-Acute Care Coordinator follow up. Mr. Awwad resides in [facility] skilled nursing facility. Mr. Seepersad has been active with  coordination team.  Collaboration with [facility] social worker. Mr. Sinagra projected discharge date is 03/19/23. Wilkie Aye to meet with Mr. Collis' daughter to prior to discharge.   Will update care coordination team.   Will continue to follow.   Raiford Noble, MSN, RN,BSN Post Acute Care Coordinator [phone] (Direct dial)

## 2023-03-17 DIAGNOSIS — R2689 Other abnormalities of gait and mobility: Secondary | ICD-10-CM | POA: Diagnosis not present

## 2023-03-17 DIAGNOSIS — I69351 Hemiplegia and hemiparesis following cerebral infarction affecting right dominant side: Secondary | ICD-10-CM | POA: Diagnosis not present

## 2023-03-17 DIAGNOSIS — M6281 Muscle weakness (generalized): Secondary | ICD-10-CM | POA: Diagnosis not present

## 2023-03-17 DIAGNOSIS — I69391 Dysphagia following cerebral infarction: Secondary | ICD-10-CM | POA: Diagnosis not present

## 2023-03-17 DIAGNOSIS — R569 Unspecified convulsions: Secondary | ICD-10-CM | POA: Diagnosis not present

## 2023-03-21 DIAGNOSIS — R2689 Other abnormalities of gait and mobility: Secondary | ICD-10-CM | POA: Diagnosis not present

## 2023-03-21 DIAGNOSIS — M6281 Muscle weakness (generalized): Secondary | ICD-10-CM | POA: Diagnosis not present

## 2023-03-21 DIAGNOSIS — R569 Unspecified convulsions: Secondary | ICD-10-CM | POA: Diagnosis not present

## 2023-03-21 DIAGNOSIS — I69391 Dysphagia following cerebral infarction: Secondary | ICD-10-CM | POA: Diagnosis not present

## 2023-03-21 DIAGNOSIS — I69351 Hemiplegia and hemiparesis following cerebral infarction affecting right dominant side: Secondary | ICD-10-CM | POA: Diagnosis not present

## 2023-03-23 DIAGNOSIS — I69351 Hemiplegia and hemiparesis following cerebral infarction affecting right dominant side: Secondary | ICD-10-CM | POA: Diagnosis not present

## 2023-03-23 DIAGNOSIS — I10 Essential (primary) hypertension: Secondary | ICD-10-CM | POA: Diagnosis not present

## 2023-03-23 DIAGNOSIS — N1831 Chronic kidney disease, stage 3a: Secondary | ICD-10-CM | POA: Diagnosis not present

## 2023-03-24 DIAGNOSIS — R569 Unspecified convulsions: Secondary | ICD-10-CM | POA: Diagnosis not present

## 2023-03-24 DIAGNOSIS — I10 Essential (primary) hypertension: Secondary | ICD-10-CM | POA: Diagnosis not present

## 2023-03-24 DIAGNOSIS — E119 Type 2 diabetes mellitus without complications: Secondary | ICD-10-CM | POA: Diagnosis not present

## 2023-03-24 DIAGNOSIS — R2689 Other abnormalities of gait and mobility: Secondary | ICD-10-CM | POA: Diagnosis not present

## 2023-03-24 DIAGNOSIS — I69391 Dysphagia following cerebral infarction: Secondary | ICD-10-CM | POA: Diagnosis not present

## 2023-03-24 DIAGNOSIS — I69351 Hemiplegia and hemiparesis following cerebral infarction affecting right dominant side: Secondary | ICD-10-CM | POA: Diagnosis not present

## 2023-03-24 DIAGNOSIS — M6281 Muscle weakness (generalized): Secondary | ICD-10-CM | POA: Diagnosis not present

## 2023-03-28 DIAGNOSIS — R2689 Other abnormalities of gait and mobility: Secondary | ICD-10-CM | POA: Diagnosis not present

## 2023-03-28 DIAGNOSIS — I69391 Dysphagia following cerebral infarction: Secondary | ICD-10-CM | POA: Diagnosis not present

## 2023-03-28 DIAGNOSIS — M6281 Muscle weakness (generalized): Secondary | ICD-10-CM | POA: Diagnosis not present

## 2023-03-28 DIAGNOSIS — I69351 Hemiplegia and hemiparesis following cerebral infarction affecting right dominant side: Secondary | ICD-10-CM | POA: Diagnosis not present

## 2023-03-28 DIAGNOSIS — R569 Unspecified convulsions: Secondary | ICD-10-CM | POA: Diagnosis not present

## 2023-03-29 ENCOUNTER — Other Ambulatory Visit: Payer: Self-pay

## 2023-03-29 DIAGNOSIS — I6529 Occlusion and stenosis of unspecified carotid artery: Secondary | ICD-10-CM

## 2023-03-30 ENCOUNTER — Other Ambulatory Visit: Payer: Self-pay | Admitting: *Deleted

## 2023-03-30 NOTE — Patient Outreach (Signed)
Post- Acute Care Coordinator follow up. Mr. Huml resides in [facility] skilled nursing facility. Mr. Croxford is active with care coordination services/ chronic management team.   Collaboration with [facility] social worker. Mr. Pregler has transitioned to short term long term care. Medicaid is pending. Recently lost appeal.   Writer will plan outreach to daughter [name].  Raiford Noble, MSN, RN, BSN [place]  [facility], [facility] RN Post- Acute Care Coordinator Direct Dial: [phone]

## 2023-04-13 ENCOUNTER — Other Ambulatory Visit: Payer: Self-pay | Admitting: *Deleted

## 2023-04-13 NOTE — Patient Outreach (Signed)
Post- Acute Care Coordinator follow up. Mr. Carlos Shields resides in [facility] skilled nursing facility. Mr. Carlos Shields has been active with chronic care management team LCSW.   Collaboration visit with [facility] social worker. Carlos Shields reports transition plan is to remain in long term care. States daughter is working on [employer] process.  Telephone call made to daughter Carlos Shields (daughter/DPR) [phone]. Patient identifiers confirmed. Carlos Shields reports she is working with [employer] in applying for [employer]. States she recently had to provide additional paperwork. Carlos Shields confirms Carlos Shields will remain in LTC at [facility].   Writer will update CCM LCSW.   No identifiable care coordination needs at this time.   Carlos Noble, MSN, RN, BSN [place]  [facility], [facility] RN Post- Acute Care Coordinator Direct Dial: [phone]

## 2023-04-20 ENCOUNTER — Ambulatory Visit (INDEPENDENT_AMBULATORY_CARE_PROVIDER_SITE_OTHER): Payer: Medicare Other | Admitting: Neurology

## 2023-04-20 ENCOUNTER — Encounter: Payer: Self-pay | Admitting: Neurology

## 2023-04-20 VITALS — BP 110/66

## 2023-04-20 DIAGNOSIS — G40909 Epilepsy, unspecified, not intractable, without status epilepticus: Secondary | ICD-10-CM

## 2023-04-20 DIAGNOSIS — I639 Cerebral infarction, unspecified: Secondary | ICD-10-CM

## 2023-04-20 DIAGNOSIS — F03A0 Unspecified dementia, mild, without behavioral disturbance, psychotic disturbance, mood disturbance, and anxiety: Secondary | ICD-10-CM

## 2023-04-20 DIAGNOSIS — W19XXXS Unspecified fall, sequela: Secondary | ICD-10-CM

## 2023-04-20 DIAGNOSIS — R269 Unspecified abnormalities of gait and mobility: Secondary | ICD-10-CM | POA: Diagnosis not present

## 2023-04-20 MED ORDER — MEMANTINE HCL 10 MG PO TABS: 10.0000 mg | ORAL_TABLET | Freq: Two times a day (BID) | ORAL | 3 refills | Status: DC

## 2023-04-20 MED ORDER — LEVETIRACETAM 500 MG PO TABS: 500.0000 mg | ORAL_TABLET | Freq: Two times a day (BID) | ORAL | 3 refills | Status: DC

## 2023-04-20 MED ORDER — CLOPIDOGREL BISULFATE 75 MG PO TABS: 75.0000 mg | ORAL_TABLET | Freq: Every day | ORAL | 11 refills | Status: DC

## 2023-04-20 NOTE — Patient Instructions (Signed)
Continue current medications  Referral to PT and OT to be done at [facility]  Follow up in a year or sooner if worse

## 2023-04-20 NOTE — Progress Notes (Signed)
[facility]  PATIENT: Carlos Shields DOB: February 17, 1931  REQUESTING CLINICIAN: Norm Salt, PA HISTORY FROM: Daughter and chart review  REASON FOR VISIT: CVA and Seizure    HISTORICAL  CHIEF COMPLAINT:  Chief Complaint  Patient presents with   Follow-up    Rm 13, with daughter, has had seizure and fall since last visit.    INTERVAL HISTORY 04/20/2023:  Patient presents today for follow-up, he is accompanied by daughter.  Last visit was in April, since then he had 1 fall in May, was seen in the hospital at that time we repeated the Keppra level and was elevated, Keppra decreased from 750 mg twice daily to 500 mg twice daily.  He was living at home with daughter then in July 11, he did have a seizure described as unresponsiveness, brief seizure admitted to the hospital and from then patient was admitted to [facility].  Since being at the [facility] facility, he has been doing well, no additional seizures, no falls, no other complaints.  Daughter tells me that he is not getting physical therapy but otherwise he is doing well.    HISTORY OF PRESENT ILLNESS:  This is a 89 year old gentleman past medical history of dementia, hypertension, hyperlipidemia, seizure disorder, and stroke who is presenting for follow-up after hospitalization.  Patient was recently admitted to the hospital for left hemispheric strokes.  He was also found to have a left carotid occlusion which was revascularized by Dr. Randie Heinz. His stroke etiology was likely large vessel occlusion. Patient completed aspirin and Plavix for total of 3 weeks and currently on Plavix alone.  Unclear if daughter is still giving him aspirin.  For his seizure, he was on Keppra 500 mg twice daily, and on March 11 he had a breakthrough seizure described by daughter as eyes rolled back with convulsion. There was also post ictal confusion as patient attempted to take his clothes off and walk away. Marland Kitchen  He presented to the ED,  the Keppra level was not checked at that time but plan was to increase Keppra to 750 mg twice daily.  Since being on the 750 mg twice daily no additional seizures and no side effect from the medicine.  He continued to receive PT OT and speech.  Daughter denies any recent fall    Hospital course and summary  Liron Heineman is a 89 y.o. right-handed male with history of seizure maintained on Keppra, hypertension, CKD stage III, pancreatitis, venous insufficiency and dysphagia who was admitted 08/05/2022 for right-sided weakness, incontinence, seizure-like activity and fall.  He had been on his Keppra and aspirin.  In the ED his workup was notable for creatinine 2.1 hemoglobin 9.5 TSH 4.922, hemoglobin A1c 5.3.  He underwent MRI of the brain 08/05/2022 which revealed multiple small acute infarcts involving the left basal ganglia, left frontal, parietal and occipital cortex and adjacent white matter with slight edema but no mass effect.  He was admitted and underwent MRI of the head 08/06/2022 which showed intracranial atherosclerotic disease with associated moderate stenosis at the left carotid siphon with additional severe distal L P3 stenosis.  Vascular surgery consulted he underwent CTA of the neck 08/08/2022 which showed proximal left ICA stenosis 75% and 65% on the right and subsequently underwent left transcarotid artery revascularization 08/12/2022 per Dr. Randie Heinz.  Lipid panel done 08/06/2022 showing LDL 69 cholesterol 143 started on Crestor during hospitalization.  Echocardiogram preserved EF ejection fraction 60 to 65% but LV diastolic parameters grade 1 diastolic  dysfunction.  He resumed his aspirin as prior to admission and started on Plavix daily 08/08/2018 for which she would continue for 3 weeks then Plavix alone.  Therapy evaluations completed due to patient decreased functional mobility/right-sided weakness was admitted for a comprehensive rehab program.   Caffrey Herdman was admitted to rehab 08/14/2022 for inpatient  therapies to consist of PT, ST and OT at least three hours five days a week. Past admission physiatrist, therapy team and rehab RN have worked together to provide customized collaborative inpatient rehab.  Pertaining to patient's acute ischemic CVA infarcts involving left basal ganglia left frontal right occipital cortex and adjacent white matter remained stable he would follow-up with neurology services.  He remained on aspirin and Plavix for CVA prophylaxis through 08/29/2022 then Plavix alone.  Noted history of dysphagia he continued on dysphagia #1 thin liquid diet.  Keppra ongoing for history of seizure no seizure activity.  Lovenox for DVT prophylaxis no bleeding episodes.  Left internal carotid artery stenosis status post TCAR 08/12/2022 followed by Dr. Randie Heinz.  Pain management use of oxycodone as needed.  Crestor ongoing for hyperlipidemia.  Blood pressure monitored permissive hypertension maintained on low-dose Lopressor and home Norvasc resumed for now and would need outpatient follow-up.  Acute on chronic CKD stage III with latest creatinine 1.67.  Hospital course findings of elevated TSH 4.9 on admission Free T40.88 Within normal limits follow-up PCP.     OTHER MEDICAL CONDITIONS: Dementia, hypertension, hyperlipidemia, epilepsy    REVIEW OF SYSTEMS: Full 14 system review of systems performed and negative with exception of: Unable to fully obtain   ALLERGIES: No Known Allergies  HOME MEDICATIONS: Outpatient Medications Prior to Visit  Medication Sig Dispense Refill   acetaminophen (TYLENOL) 325 MG tablet Take 1-2 tablets (325-650 mg total) by mouth every 4 (four) hours as needed for mild pain.     amLODipine (NORVASC) 5 MG tablet Take 1 tablet (5 mg total) by mouth daily. 30 tablet 0   atorvastatin (LIPITOR) 40 MG tablet Take 40 mg by mouth daily.     metoprolol tartrate (LOPRESSOR) 25 MG tablet Take 0.5 tablets (12.5 mg total) by mouth 2 (two) times daily. 60 tablet 0   pantoprazole  (PROTONIX) 40 MG tablet Take 1 tablet (40 mg total) by mouth daily. 30 tablet 0   aspirin EC 81 MG tablet Take 81 mg by mouth daily. Swallow whole.     clopidogrel (PLAVIX) 75 MG tablet Take 1 tablet (75 mg total) by mouth daily. 30 tablet 0   levETIRAcetam (KEPPRA) 500 MG tablet Take 1 tablet (500 mg total) by mouth 2 (two) times daily. 60 tablet 6   memantine (NAMENDA) 10 MG tablet Take 1 tablet (10 mg total) by mouth 2 (two) times daily. 180 tablet 3   No facility-administered medications prior to visit.    PAST MEDICAL HISTORY: Past Medical History:  Diagnosis Date   CKD (chronic kidney disease) stage 3, GFR 30-59 ml/min (HCC)    Gait abnormality    Hypertension    Pancreatitis    Seizures (HCC)    Venous insufficiency     PAST SURGICAL HISTORY: Past Surgical History:  Procedure Laterality Date   HERNIA REPAIR     TRANSCAROTID ARTERY REVASCULARIZATION  Left 08/12/2022   Procedure: Transcarotid Artery Revascularization;  Surgeon: Maeola Harman, MD;  Location: [facility] OR;  Service: Vascular;  Laterality: Left;    FAMILY HISTORY: Family History  Problem Relation Age of Onset   Hypertension Other  Diabetes Other    Asthma Other     SOCIAL HISTORY: Social History   Socioeconomic History   Marital status: Divorced    Spouse name: Not on file   Number of children: 2   Years of education: Not on file   Highest education level: Not on file  Occupational History   Occupation: retired  Tobacco Use   Smoking status: Former    Types: Cigars   Smokeless tobacco: Never  Vaping Use   Vaping status: Never Used  Substance and Sexual Activity   Alcohol use: No   Drug use: No   Sexual activity: Never  Other Topics Concern   Not on file  Social History Narrative   Right handed   Caffeine- none   Lives at SNF   Social Determinants of Health   Financial Resource Strain: Not on file  Food Insecurity: No Food Insecurity (08/06/2022)   Hunger Vital Sign     Worried About Running Out of Food in the Last Year: Never true    Ran Out of Food in the Last Year: Never true  Transportation Needs: No Transportation Needs (08/06/2022)   PRAPARE - Administrator, Civil Service (Medical): No    Lack of Transportation (Non-Medical): No  Physical Activity: Insufficiently Active (03/08/2023)   Exercise Vital Sign    Days of Exercise per Week: 3 days    Minutes of Exercise per Session: 10 min  Stress: Stress Concern Present (03/08/2023)   [employer] of Occupational Health - Occupational Stress Questionnaire    Feeling of Stress : To some extent  Social Connections: Not on file  Intimate Partner Violence: Not At Risk (08/06/2022)   Humiliation, Afraid, Rape, and Kick questionnaire    Fear of Current or Ex-Partner: No    Emotionally Abused: No    Physically Abused: No    Sexually Abused: No    PHYSICAL EXAM  GENERAL EXAM/CONSTITUTIONAL: Vitals:  Vitals:   04/20/23 1429  BP: 110/66   There is no height or weight on file to calculate BMI. Wt Readings from Last 3 Encounters:  03/16/23 119 lb (54 kg)  11/12/22 119 lb 0.8 oz (54 kg)  10/13/22 120 lb (54.4 kg)   Patient is in no distress; well developed, nourished and groomed; neck is supple  EYES: Visual fields full to confrontation, Extraocular movements intacts,   MUSCULOSKELETAL: Gait, strength, tone, movements noted in Neurologic exam below  NEUROLOGIC: MENTAL STATUS:      No data to display         awake, alert, oriented to person and place only  Couldn't remember recent hospitalization.  Able to name simple object  Able to count the number of quarter in $1 but not $2.   CRANIAL NERVE:  2nd, 3rd, 4th, 6th - Visual fields full to confrontation, extraocular muscles intact, no nystagmus 5th - facial sensation symmetric 7th - facial strength symmetric 8th - hearing intact 9th - palate elevates symmetrically, uvula midline 11th - shoulder shrug symmetric 12th -  tongue protrusion midline  MOTOR:  normal bulk and tone, Subtle weakness in the RUE, right pronator. RUE extremity limited by pain  SENSORY:  normal and symmetric to light touch  COORDINATION:  finger-nose-finger, fine finger movements normal  GAIT/STATION:  Deferred, using a wheelchair    DIAGNOSTIC DATA (LABS, IMAGING, TESTING) - I reviewed patient records, labs, notes, testing and imaging myself where available.  Lab Results  Component Value Date   WBC 6.1 02/10/2023  HGB 9.3 (L) 02/10/2023   HCT 29.8 (L) 02/10/2023   MCV 92.5 02/10/2023   PLT 197 02/10/2023      Component Value Date/Time   NA 136 02/10/2023 1555   K 4.0 02/10/2023 1555   CL 105 02/10/2023 1555   CO2 22 02/10/2023 1555   GLUCOSE 97 02/10/2023 1555   BUN 21 02/10/2023 1555   CREATININE 1.60 (H) 02/10/2023 1555   CALCIUM 8.9 02/10/2023 1555   PROT 7.5 01/20/2023 1209   ALBUMIN 3.8 01/20/2023 1209   AST 24 01/20/2023 1209   ALT 14 01/20/2023 1209   ALKPHOS 81 01/20/2023 1209   BILITOT 1.0 01/20/2023 1209   GFRNONAA 41 (L) 02/10/2023 1555   GFRAA 51 (L) 11/19/2019 2118   Lab Results  Component Value Date   CHOL 143 08/06/2022   HDL 69 08/06/2022   LDLCALC 69 08/06/2022   TRIG 27 08/06/2022   CHOLHDL 2.1 08/06/2022   Lab Results  Component Value Date   HGBA1C 5.3 08/05/2022   Lab Results  Component Value Date   VITAMINB12 443 05/04/2013   Lab Results  Component Value Date   TSH 4.922 (H) 08/05/2022    MRI Brain 08/05/2022 1. Multiple small acute infarcts involving the left basal ganglia, left frontal, parietal and occipital cortex and adjacent white matter. Slight edema without mass effect. 2. Advanced brain atrophy (ICD10-G31.9). and chronic microvascular ischemic diseas   EEG 04/27/2021 This study is within normal limits. No seizures or epileptiform discharges were seen throughout the recording.    ASSESSMENT AND PLAN  89 y.o. year old male with Dementia, hypertension,  hyperlipidemia, seizure disorder, and stroke who is presenting for follow-up for stroke and seizures.  1 Fall in May and 1 seizure in July. Plan is for patient to continue current medications and follow up in 1 year or sooner if worse. We will refer him to PT/OT at [facility].    1. Cerebrovascular accident (CVA), unspecified mechanism (HCC)   2. Mild dementia without behavioral disturbance, psychotic disturbance, mood disturbance, or anxiety, unspecified dementia type (HCC)   3. Seizure disorder (HCC)   4. Gait abnormality   5. Fall, sequela       Patient Instructions  Continue current medications  Referral to PT and OT to be done at [facility]  Follow up in a year or sooner if worse   Orders Placed This Encounter  Procedures   Ambulatory referral to Physical Therapy   Ambulatory referral to Occupational Therapy    Meds ordered this encounter  Medications   memantine (NAMENDA) 10 MG tablet    Sig: Take 1 tablet (10 mg total) by mouth 2 (two) times daily.    Dispense:  180 tablet    Refill:  3   levETIRAcetam (KEPPRA) 500 MG tablet    Sig: Take 1 tablet (500 mg total) by mouth 2 (two) times daily.    Dispense:  180 tablet    Refill:  3   clopidogrel (PLAVIX) 75 MG tablet    Sig: Take 1 tablet (75 mg total) by mouth daily.    Dispense:  30 tablet    Refill:  11    Return in about 1 year (around 04/19/2024).    Windell Norfolk, MD 04/20/2023, 3:11 PM  [facility] [address] [place], [place] [address] [phone]

## 2023-04-25 ENCOUNTER — Telehealth: Payer: Self-pay | Admitting: Neurology

## 2023-04-25 NOTE — Telephone Encounter (Signed)
Referral for physical therapy fax to [facility]. Phone: [phone], Fax: [phone]

## 2023-04-25 NOTE — Telephone Encounter (Signed)
Referral for occupational therapy fax to [facility]. Phone: [phone], Fax: [phone].

## 2023-05-02 ENCOUNTER — Ambulatory Visit: Payer: Self-pay | Admitting: Licensed Clinical Social Worker

## 2023-05-02 NOTE — Patient Instructions (Signed)
Visit Information  Thank you for taking time to visit with me today. Please don't hesitate to contact me if I can be of assistance to you.   Following are the goals we discussed today:   Goals Addressed             This Visit's Progress    Patient is receiving care currently at [facility]. He receives assistance with daily care needs       Interventions:  Spoke with Docia Furl, daughter of client, via phone today about client status Client is currently at [facility] and plans to remain at that SNF for long term care [name] said she has applied for [facility] for client for SNF care at that facility. She is waiting on Medicaid decision regarding client. Discussed program support Discussed pain issues of client . Discussed sleeping issues of client Discussed ambulation of client. Client uses a wheelchair to help him ambulate Discussed appetite of client. Discussed family support for client. LCSW is discharging client today from program support since client will be receiving long term care at [facility] facility  [facility] for phone call with LCSW today Encouraged client or Docia Furl to call LCSW as needed to discuss SW needs of client at [phone] Docia Furl was appreciative of call from LCSW today       No follow up interventions needed from LCSW  Please call the care guide team at [phone] if you need to cancel or reschedule your appointment.   If you are experiencing a Mental Health or Behavioral Health Crisis or need someone to talk to, please go to [facility] [address], [place] [phone])   The patient Docia Furl, daughter,verbalized understanding of instructions, educational materials, and care plan provided today and DECLINED offer to receive copy of patient instructions, educational materials, and care plan.   The patient / Docia Furl, daughter, has been provided with contact information for the care  management team and has been advised to call with any health related questions or concerns.   Kelton Pillar.Shanell Aden MSW, LCSW Licensed Visual merchandiser [facility] Care Management [phone]

## 2023-05-02 NOTE — Patient Outreach (Signed)
Care Coordination   Follow Up Visit Note   05/02/2023 Name: Carlos Shields MRN: [id number] DOB: 1930-08-31  Carlos Shields is a 89 y.o. year old male who sees Norm Salt, [place] for primary care. I spoke with  [name] / Docia Furl, daughter of client, via  phone today.  What matters to the patients health and wellness today? Patient is receiving care currently at [facility]. He  Receives assistance with daily care needs    Goals Addressed             This Visit's Progress    Patient is receiving care currently at [facility]. He receives assistance with daily care needs       Interventions:  Spoke with Docia Furl, daughter of client, via phone today about client status Client is currently at [facility] and plans to remain at that SNF for long term care [name] said she has applied for [facility] for client for SNF care at that facility. She is waiting on Medicaid decision regarding client. Discussed program support Discussed pain issues of client . Discussed sleeping issues of client Discussed ambulation of client. Client uses a wheelchair to help him ambulate Discussed appetite of client. Discussed family support for client. LCSW is discharging client today from program support since client will be receiving long term care at [facility] facility  [facility] for phone call with LCSW today Encouraged client or Docia Furl to call LCSW as needed to discuss SW needs of client at [phone] Docia Furl was appreciative of call from LCSW today        SDOH assessments and interventions completed:  Yes  SDOH Interventions Today    Flowsheet Row Most Recent Value  SDOH Interventions   Depression Interventions/Treatment  Medication  Physical Activity Interventions Other (Comments)  [uses a wheelchair to help him ambulate]  Stress Interventions Other (Comment)  [has stress in managing medical needs]        Care Coordination Interventions:  Yes, provided    Interventions Today     Flowsheet Row Most Recent Value  Chronic Disease   Chronic disease during today's visit Other  [spoke with Docia Furl, daughter , about client needs]  General Interventions   General Interventions Discussed/Reviewed General Interventions Discussed, Community Resources  Education Interventions   Education Provided Provided Education  Provided Verbal Education On [employer]  Mental Health Interventions   Mental Health Discussed/Reviewed Coping Strategies  [no mood issues noted]  Nutrition Interventions   Nutrition Discussed/Reviewed Nutrition Discussed  Pharmacy Interventions   Pharmacy Dicussed/Reviewed Pharmacy Topics Discussed  Safety Interventions   Safety Discussed/Reviewed Fall Risk      Follow up plan: No follow up interventions needed by LCSW   Encounter Outcome:  Patient Visit Completed   Kelton Pillar.Jaque Dacy MSW, LCSW Licensed Visual merchandiser [facility] Care Management [phone]

## 2023-05-06 DIAGNOSIS — R609 Edema, unspecified: Secondary | ICD-10-CM | POA: Diagnosis not present

## 2023-05-06 DIAGNOSIS — M25531 Pain in right wrist: Secondary | ICD-10-CM | POA: Diagnosis not present

## 2023-05-08 DIAGNOSIS — M6281 Muscle weakness (generalized): Secondary | ICD-10-CM | POA: Diagnosis not present

## 2023-05-08 DIAGNOSIS — I69351 Hemiplegia and hemiparesis following cerebral infarction affecting right dominant side: Secondary | ICD-10-CM | POA: Diagnosis not present

## 2023-05-09 DIAGNOSIS — D631 Anemia in chronic kidney disease: Secondary | ICD-10-CM | POA: Diagnosis not present

## 2023-07-17 ENCOUNTER — Encounter (HOSPITAL_COMMUNITY): Payer: Self-pay | Admitting: Family Medicine

## 2023-07-17 ENCOUNTER — Emergency Department (HOSPITAL_COMMUNITY): Payer: Medicare Other

## 2023-07-17 ENCOUNTER — Observation Stay (HOSPITAL_COMMUNITY)
Admission: EM | Admit: 2023-07-17 | Discharge: 2023-07-18 | Disposition: A | Discharge status: Skilled Nursing Facility | Payer: Medicare Other | Attending: Family Medicine | Admitting: Family Medicine

## 2023-07-17 ENCOUNTER — Other Ambulatory Visit: Payer: Self-pay

## 2023-07-17 DIAGNOSIS — Z87891 Personal history of nicotine dependence: Secondary | ICD-10-CM | POA: Diagnosis not present

## 2023-07-17 DIAGNOSIS — Z7901 Long term (current) use of anticoagulants: Secondary | ICD-10-CM | POA: Insufficient documentation

## 2023-07-17 DIAGNOSIS — E860 Dehydration: Secondary | ICD-10-CM | POA: Diagnosis not present

## 2023-07-17 DIAGNOSIS — Z79899 Other long term (current) drug therapy: Secondary | ICD-10-CM | POA: Insufficient documentation

## 2023-07-17 DIAGNOSIS — E785 Hyperlipidemia, unspecified: Secondary | ICD-10-CM | POA: Diagnosis not present

## 2023-07-17 DIAGNOSIS — K219 Gastro-esophageal reflux disease without esophagitis: Secondary | ICD-10-CM | POA: Insufficient documentation

## 2023-07-17 DIAGNOSIS — Z8669 Personal history of other diseases of the nervous system and sense organs: Secondary | ICD-10-CM | POA: Diagnosis not present

## 2023-07-17 DIAGNOSIS — N1832 Chronic kidney disease, stage 3b: Secondary | ICD-10-CM | POA: Diagnosis not present

## 2023-07-17 DIAGNOSIS — I129 Hypertensive chronic kidney disease with stage 1 through stage 4 chronic kidney disease, or unspecified chronic kidney disease: Secondary | ICD-10-CM | POA: Insufficient documentation

## 2023-07-17 DIAGNOSIS — Z8659 Personal history of other mental and behavioral disorders: Secondary | ICD-10-CM | POA: Diagnosis not present

## 2023-07-17 DIAGNOSIS — R531 Weakness: Secondary | ICD-10-CM | POA: Diagnosis not present

## 2023-07-17 DIAGNOSIS — Z20822 Contact with and (suspected) exposure to covid-19: Secondary | ICD-10-CM | POA: Insufficient documentation

## 2023-07-17 DIAGNOSIS — R5383 Other fatigue: Secondary | ICD-10-CM | POA: Diagnosis present

## 2023-07-17 DIAGNOSIS — R410 Disorientation, unspecified: Secondary | ICD-10-CM

## 2023-07-17 DIAGNOSIS — N1831 Chronic kidney disease, stage 3a: Secondary | ICD-10-CM | POA: Diagnosis not present

## 2023-07-17 DIAGNOSIS — D500 Iron deficiency anemia secondary to blood loss (chronic): Secondary | ICD-10-CM | POA: Insufficient documentation

## 2023-07-17 DIAGNOSIS — R509 Fever, unspecified: Secondary | ICD-10-CM | POA: Insufficient documentation

## 2023-07-17 DIAGNOSIS — R6889 Other general symptoms and signs: Secondary | ICD-10-CM

## 2023-07-17 LAB — URINALYSIS, W/ REFLEX TO CULTURE (INFECTION SUSPECTED)
Bacteria, UA: NONE SEEN
Bilirubin Urine: NEGATIVE
Glucose, UA: NEGATIVE mg/dL
Hgb urine dipstick: NEGATIVE
Ketones, ur: NEGATIVE mg/dL
Leukocytes,Ua: NEGATIVE
Nitrite: NEGATIVE
Protein, ur: NEGATIVE mg/dL
Specific Gravity, Urine: 1.01 (ref 1.005–1.030)
pH: 7 (ref 5.0–8.0)

## 2023-07-17 LAB — CBC WITH DIFFERENTIAL/PLATELET
Abs Immature Granulocytes: 0.03 10*3/uL (ref 0.00–0.07)
Basophils Absolute: 0 10*3/uL (ref 0.0–0.1)
Basophils Relative: 1 %
Eosinophils Absolute: 0.1 10*3/uL (ref 0.0–0.5)
Eosinophils Relative: 1 %
HCT: 28.4 % — ABNORMAL LOW (ref 39.0–52.0)
Hemoglobin: 9 g/dL — ABNORMAL LOW (ref 13.0–17.0)
Immature Granulocytes: 0 %
Lymphocytes Relative: 28 %
Lymphs Abs: 2.2 10*3/uL (ref 0.7–4.0)
MCH: 28.5 pg (ref 26.0–34.0)
MCHC: 31.7 g/dL (ref 30.0–36.0)
MCV: 89.9 fL (ref 80.0–100.0)
Monocytes Absolute: 0.6 10*3/uL (ref 0.1–1.0)
Monocytes Relative: 8 %
Neutro Abs: 4.9 10*3/uL (ref 1.7–7.7)
Neutrophils Relative %: 62 %
Platelets: 213 10*3/uL (ref 150–400)
RBC: 3.16 MIL/uL — ABNORMAL LOW (ref 4.22–5.81)
RDW: 13.2 % (ref 11.5–15.5)
WBC: 7.8 10*3/uL (ref 4.0–10.5)
nRBC: 0 % (ref 0.0–0.2)

## 2023-07-17 LAB — COMPREHENSIVE METABOLIC PANEL
ALT: 11 U/L (ref 0–44)
AST: 21 U/L (ref 15–41)
Albumin: 3.6 g/dL (ref 3.5–5.0)
Alkaline Phosphatase: 66 U/L (ref 38–126)
Anion gap: 11 (ref 5–15)
BUN: 26 mg/dL — ABNORMAL HIGH (ref 8–23)
CO2: 21 mmol/L — ABNORMAL LOW (ref 22–32)
Calcium: 9 mg/dL (ref 8.9–10.3)
Chloride: 107 mmol/L (ref 98–111)
Creatinine, Ser: 1.31 mg/dL — ABNORMAL HIGH (ref 0.61–1.24)
GFR, Estimated: 52 mL/min — ABNORMAL LOW (ref >60)
Glucose, Bld: 98 mg/dL (ref 70–99)
Potassium: 4.3 mmol/L (ref 3.5–5.1)
Sodium: 139 mmol/L (ref 135–145)
Total Bilirubin: 0.8 mg/dL (ref 0.0–1.2)
Total Protein: 7.3 g/dL (ref 6.5–8.1)

## 2023-07-17 LAB — PROTIME-INR
INR: 1.3 — ABNORMAL HIGH (ref 0.8–1.2)
Prothrombin Time: 16.5 s — ABNORMAL HIGH (ref 11.4–15.2)

## 2023-07-17 LAB — I-STAT CG4 LACTIC ACID, ED: Lactic Acid, Venous: 1.6 mmol/L (ref 0.5–1.9)

## 2023-07-17 LAB — APTT: aPTT: 29 s (ref 24–36)

## 2023-07-17 MED ORDER — PANTOPRAZOLE SODIUM 40 MG PO TBEC
40.0000 mg | DELAYED_RELEASE_TABLET | Freq: Every day | ORAL | Status: DC
  Administered 2023-07-17: 40 mg via ORAL
  Filled 2023-07-17: qty 1

## 2023-07-17 MED ORDER — LACTATED RINGERS IV BOLUS (SEPSIS)
1000.0000 mL | Freq: Once | INTRAVENOUS | Status: AC
  Administered 2023-07-17: 1000 mL via INTRAVENOUS

## 2023-07-17 MED ORDER — ONDANSETRON HCL 4 MG/2ML IJ SOLN: 4.0000 mg | Freq: Four times a day (QID) | INTRAMUSCULAR | Status: DC | PRN

## 2023-07-17 MED ORDER — ONDANSETRON HCL 4 MG PO TABS: 4.0000 mg | ORAL_TABLET | Freq: Four times a day (QID) | ORAL | Status: DC | PRN

## 2023-07-17 MED ORDER — ACETAMINOPHEN 325 MG PO TABS: 650.0000 mg | ORAL_TABLET | Freq: Four times a day (QID) | ORAL | Status: DC | PRN

## 2023-07-17 MED ORDER — ASPIRIN 81 MG PO TBEC
81.0000 mg | DELAYED_RELEASE_TABLET | Freq: Every day | ORAL | Status: DC
  Administered 2023-07-17: 81 mg via ORAL
  Filled 2023-07-17: qty 1

## 2023-07-17 MED ORDER — ATORVASTATIN CALCIUM 10 MG PO TABS
20.0000 mg | ORAL_TABLET | Freq: Every day | ORAL | Status: DC
  Administered 2023-07-17: 20 mg via ORAL
  Filled 2023-07-17: qty 2

## 2023-07-17 MED ORDER — ACETAMINOPHEN 650 MG RE SUPP: 650.0000 mg | Freq: Four times a day (QID) | RECTAL | Status: DC | PRN

## 2023-07-17 MED ORDER — LORAZEPAM 2 MG/ML IJ SOLN
INTRAMUSCULAR | Status: AC
  Administered 2023-07-17: 1 mg
  Filled 2023-07-17: qty 1

## 2023-07-17 MED ORDER — ENOXAPARIN SODIUM 30 MG/0.3ML IJ SOSY: 30.0000 mg | PREFILLED_SYRINGE | INTRAMUSCULAR | Status: DC

## 2023-07-17 MED ORDER — LEVETIRACETAM 500 MG PO TABS
500.0000 mg | ORAL_TABLET | Freq: Two times a day (BID) | ORAL | Status: DC
  Administered 2023-07-17: 500 mg via ORAL
  Filled 2023-07-17: qty 1

## 2023-07-17 MED ORDER — GABAPENTIN 100 MG PO CAPS
100.0000 mg | ORAL_CAPSULE | Freq: Three times a day (TID) | ORAL | Status: DC
  Administered 2023-07-17: 100 mg via ORAL
  Filled 2023-07-17: qty 1

## 2023-07-17 MED ORDER — SODIUM CHLORIDE 0.9% FLUSH
3.0000 mL | Freq: Two times a day (BID) | INTRAVENOUS | Status: DC
  Administered 2023-07-17: 3 mL via INTRAVENOUS

## 2023-07-17 MED ORDER — METOPROLOL TARTRATE 25 MG PO TABS
12.5000 mg | ORAL_TABLET | Freq: Two times a day (BID) | ORAL | Status: DC
  Administered 2023-07-17: 12.5 mg via ORAL
  Filled 2023-07-17: qty 1

## 2023-07-17 MED ORDER — MEMANTINE HCL 10 MG PO TABS
10.0000 mg | ORAL_TABLET | Freq: Two times a day (BID) | ORAL | Status: DC
  Administered 2023-07-17: 10 mg via ORAL
  Filled 2023-07-17: qty 1

## 2023-07-17 MED ORDER — SODIUM CHLORIDE 0.9 % IV SOLN: INTRAVENOUS | Status: DC

## 2023-07-17 NOTE — ED Provider Notes (Signed)
 3:14 PM Assumed care of patient from off-going team. For more details, please see note from same day.  In brief, this is a 89 y.o. male with questionable syncopal/seizure episode. Has low grade temperature. AMS. SNF stated that he wasn't acting right so sent to ED. Not responding well and shaky here in ED. Possible rigoring as opposed to seizures. Does have h/o seizures. Chronic anemia bu tno leukocytosis.  CTH: IMPRESSION:  1. No CT etiology for altered mental status identified  2. Chronic infarcts in the bilateral thalami, left cerebellum,  bilateral basal ganglia, and left parietal lobe.  3. Frothy secretions in the right sphenoid and left maxillary sinus.  Correlate for symptoms of acute sinusitis.    Plan/Dispo at time of sign-out & ED Course since sign-out: [ ]  labs [ ]  admission  BP 135/78   Pulse (!) 101   Temp 100.1 F (37.8 C) (Rectal)   Resp 18   Ht 5' 4 (1.626 m)   Wt 54 kg   SpO2 100%   BMI 20.43 kg/m    ED Course:   Clinical Course as of 07/17/23 1819  Sun Jul 17, 2023  1709 Lactic Acid, Venous: 1.6 neg [HN]    Clinical Course User Index [HN] Franklyn Sid SAILOR, MD   Pt with Unremarkable CMP. Normotensive here. Will consult to medicine for AMS, lethargy, possible rigors. Blood cultures drawn.  Dispo: Admit ------------------------------- Sid Franklyn, MD Emergency Medicine  This note was created using dictation software, which may contain spelling or grammatical errors.   Franklyn Sid SAILOR, MD 07/17/23 [id number]

## 2023-07-17 NOTE — ED Notes (Signed)
 Pt presented with tremors, gaze without eye tracking, and not responsive to verbal or painful stimulus. VSS throughout. Provider to bedside. Verbal order of 1mg  ativan

## 2023-07-17 NOTE — ED Notes (Signed)
 IV team at bedside

## 2023-07-17 NOTE — ED Notes (Signed)
 Pt to CT, well appearing at this time.

## 2023-07-17 NOTE — ED Triage Notes (Signed)
 PT BIB EMS from SNF [facility] for fever, lethargy, and hypotension. A&Oxself only-hx dementia. Pt has hx of CVA with right sided facial droop, aphasia and weakness to right side. EMS placed 22G left wrist, gave 250cc resulting in improved BP.

## 2023-07-17 NOTE — ED Provider Notes (Signed)
 [place] EMERGENCY DEPARTMENT AT [facility] Provider Note   CSN: [id number] Arrival date & time: 07/17/23  1120     History  Chief Complaint  Patient presents with   Weakness         Mar Walmer is a 89 y.o. male.   Weakness    Patient has a history of hypertension seizures pancreatitis chronic kidney disease, stroke.  He presents to the ED for evaluation of altered mental status.  Patient has been having some trouble with fever as well as lethargy at his nursing facility.  They noted that he was not looking right for the last day or so.  There has been influenza and COVID at the nursing facility.  Family noted when he arrived here he was having an episode where he was shaking and not answering questions.  They were concerned he was having a seizure and I was called to the bedside.  Patient is looking at me and tremulous but is not answering questions or following commands  Home Medications Prior to Admission medications   Medication Sig Start Date End Date Taking? Authorizing Provider  acetaminophen  (TYLENOL ) 325 MG tablet Take 1-2 tablets (325-650 mg total) by mouth every 4 (four) hours as needed for mild pain. 08/26/22   Angiulli, Toribio PARAS, PA-C  amLODipine  (NORVASC ) 5 MG tablet Take 1 tablet (5 mg total) by mouth daily. 08/26/22   Angiulli, Toribio PARAS, PA-C  atorvastatin  (LIPITOR) 40 MG tablet Take 40 mg by mouth daily. 12/22/22   [provider]  clopidogrel  (PLAVIX ) 75 MG tablet Take 1 tablet (75 mg total) by mouth daily. 04/20/23   Camara, Amadou, MD  levETIRAcetam  (KEPPRA ) 500 MG tablet Take 1 tablet (500 mg total) by mouth 2 (two) times daily. 04/20/23 04/14/24  Camara, Amadou, MD  memantine  (NAMENDA ) 10 MG tablet Take 1 tablet (10 mg total) by mouth 2 (two) times daily. 04/20/23 04/14/24  Camara, Amadou, MD  metoprolol  tartrate (LOPRESSOR ) 25 MG tablet Take 0.5 tablets (12.5 mg total) by mouth 2 (two) times daily. 08/26/22   Angiulli, Toribio PARAS, PA-C  pantoprazole   (PROTONIX ) 40 MG tablet Take 1 tablet (40 mg total) by mouth daily. 08/26/22   Angiulli, Toribio PARAS, PA-C      Allergies    Patient has no known allergies.    Review of Systems   Review of Systems  Neurological:  Positive for weakness.    Physical Exam Updated Vital Signs BP 135/78   Pulse (!) 101   Temp 100.1 F (37.8 C) (Rectal)   Resp 18   Ht 1.626 m (5' 4)   Wt 54 kg   SpO2 100%   BMI 20.43 kg/m  Physical Exam Vitals and nursing note reviewed.  Constitutional:      Appearance: He is well-developed. He is ill-appearing. He is not diaphoretic.  HENT:     Head: Normocephalic and atraumatic.     Right Ear: External ear normal.     Left Ear: External ear normal.  Eyes:     General: No scleral icterus.       Right eye: No discharge.        Left eye: No discharge.     Conjunctiva/sclera: Conjunctivae normal.  Neck:     Trachea: No tracheal deviation.  Cardiovascular:     Rate and Rhythm: Normal rate and regular rhythm.  Pulmonary:     Effort: Pulmonary effort is normal. No respiratory distress.     Breath sounds: Normal breath sounds.  No stridor. No wheezing or rales.  Abdominal:     General: Bowel sounds are normal. There is no distension.     Palpations: Abdomen is soft.     Tenderness: There is no abdominal tenderness. There is no guarding or rebound.  Musculoskeletal:        General: No tenderness or deformity.     Cervical back: Neck supple.  Skin:    General: Skin is warm and dry.     Findings: No rash.  Neurological:     GCS: GCS eye subscore is 4. GCS verbal subscore is 1. GCS motor subscore is 5.     Motor: Abnormal muscle tone and seizure activity present.     Comments: Patient's eyes are open, initially was just looking to 1 side but now seems to be looking at me and tracking, however he is not following commands, he is diffusely tremulous unclear if this is tremors/rigors versus seizure activity  Psychiatric:        Mood and Affect: Mood normal.      ED Results / Procedures / Treatments   Labs (all labs ordered are listed, but only abnormal results are displayed) Labs Reviewed - No data to display  EKG None  Radiology No results found.  Procedures Procedures    Medications Ordered in ED Medications  LORazepam  (ATIVAN ) 2 MG/ML injection (has no administration in time range)  lactated ringers  bolus 1,000 mL (has no administration in time range)    ED Course/ Medical Decision Making/ A&P Clinical Course as of 07/17/23 1908  [place] Jul 17, 2023  1709 Lactic Acid, Venous: 1.6 neg [HN]    Clinical Course User Index [HN] Franklyn Sid SAILOR, MD                                 Medical Decision Making Amount and/or Complexity of Data Reviewed Labs: ordered. Decision-making details documented in ED Course. Radiology: ordered and independent interpretation performed.  Risk Decision regarding hospitalization.   Pt presented to the ED for altered mental status.   Tremulous in the ED, rigors vs seizure like activity.  During the witnessed episode in the ED pt seemed to be responding to voice arguing against generalized seizure.  Low grade temps noted on arrival suggesting possible infectious etiology.  Blood cultures sent. CT without acute findings.  No pNA on cxr.  Care turned over to Dr Franklyn at shift change.  UA pending Plan on admission for further evaluation.        Final Clinical Impression(s) / ED Diagnoses Final diagnoses:  None    Rx / DC Orders ED Discharge Orders     None         Randol Simmonds, MD 07/17/23 1911

## 2023-07-17 NOTE — H&P (Signed)
 History and Physical    Carlos Shields FMW:[id number] DOB: 11-May-1931 DOA: 07/17/2023  PCP: Rosalea Rosina SAILOR, PA  Patient coming from: Nursing home/[facility]  I have personally briefly reviewed patient's old medical records in [facility]  Chief Complaint: Lethargic  HPI: Carlos Shields is a 89 y.o. male with medical history significant of  seizure on antiepileptics, CVA, hypertension, CKD stage IIIa, dysphagia who was brought in from nursing home due to reportedly fever at the facility along with lethargy and hypotension.  He has a history of CVA leading to right facial droop and aphasia and weakness to the right side.  Reportedly also was confused.  Facility noted that he was looking different.  There has been influenza and COVID at the nursing facility.  Per ED physician, family noted that he had an episode of shaking and not answering questions when he arrived here.  They were concerned about seizure.  When I saw this patient myself, patient was alert and oriented to himself and month but not the place or the year.  He did not have any complaint.  Appears very weak.  I am not sure what his baseline is.  I tried calling patient's daughter but no response.  ED Course: Upon arrival, he was slightly tachycardic but rest of the vitals were stable.  He was not hypoxic and he was afebrile.  Chest x-ray negative CT head negative as well.  All the labs are within normal range as well.  CT head shows possibility of sinusitis however patient did not have any complaint and no tenderness. Hospital service was called for admission for basically lethargy.  At the time I was called and at the time of this dictation, patient's COVID/RSV/influenza is still pending.  All he received in the ED was 1 L of IV fluid.  Review of Systems: As per HPI otherwise negative.    Past Medical History:  Diagnosis Date   CKD (chronic kidney disease) stage 3, GFR 30-59 ml/min (HCC)    Gait abnormality    Hypertension     Pancreatitis    Seizures (HCC)    Venous insufficiency     Past Surgical History:  Procedure Laterality Date   HERNIA REPAIR     TRANSCAROTID ARTERY REVASCULARIZATION  Left 08/12/2022   Procedure: Transcarotid Artery Revascularization;  Surgeon: Sheree Penne Bruckner, MD;  Location: [facility] OR;  Service: Vascular;  Laterality: Left;     reports that he has quit smoking. His smoking use included cigars. He has never used smokeless tobacco. He reports that he does not drink alcohol  and does not use drugs.  No Known Allergies  Family History  Problem Relation Age of Onset   Hypertension Other    Diabetes Other    Asthma Other     Prior to Admission medications   Medication Sig Start Date End Date Taking? Authorizing Provider  aspirin  EC 81 MG tablet Take 81 mg by mouth daily. Swallow whole.   Yes [provider]  clopidogrel  (PLAVIX ) 75 MG tablet Take 1 tablet (75 mg total) by mouth daily. 04/20/23   Camara, Amadou, MD  levETIRAcetam  (KEPPRA ) 500 MG tablet Take 1 tablet (500 mg total) by mouth 2 (two) times daily. 04/20/23 04/14/24  Camara, Amadou, MD  memantine  (NAMENDA ) 10 MG tablet Take 1 tablet (10 mg total) by mouth 2 (two) times daily. 04/20/23 04/14/24  Camara, Amadou, MD  metoprolol  tartrate (LOPRESSOR ) 25 MG tablet Take 0.5 tablets (12.5 mg total) by mouth 2 (two) times  daily. 08/26/22   Angiulli, Toribio PARAS, PA-C  pantoprazole  (PROTONIX ) 40 MG tablet Take 1 tablet (40 mg total) by mouth daily. 08/26/22   Pegge Toribio PARAS, PA-C    Physical Exam: Vitals:   07/17/23 1128 07/17/23 1129 07/17/23 1132 07/17/23 1628  BP:   135/78 (!) 160/90  Pulse:   (!) 101 (!) 105  Resp:   18 20  Temp:   100.1 F (37.8 C) 99.7 F (37.6 C)  TempSrc:   Rectal Oral  SpO2:  100% 100% 100%  Weight: 54 kg     Height: 5' 4 (1.626 m)       Constitutional: NAD, calm, comfortable Vitals:   07/17/23 1128 07/17/23 1129 07/17/23 1132 07/17/23 1628  BP:   135/78 (!) 160/90  Pulse:   (!) 101  (!) 105  Resp:   18 20  Temp:   100.1 F (37.8 C) 99.7 F (37.6 C)  TempSrc:   Rectal Oral  SpO2:  100% 100% 100%  Weight: 54 kg     Height: 5' 4 (1.626 m)      Eyes: PERRL, lids and conjunctivae normal ENMT: Dry membranes are moist. Posterior pharynx clear of any exudate or lesions.Normal dentition.  Neck: normal, supple, no masses, no thyromegaly Respiratory: clear to auscultation bilaterally, no wheezing, no crackles. Normal respiratory effort. No accessory muscle use.  Cardiovascular: Regular rate and rhythm, no murmurs / rubs / gallops. No extremity edema. 2+ pedal pulses. No carotid bruits.  Abdomen: no tenderness, no masses palpated. No hepatosplenomegaly. Bowel sounds positive.  Musculoskeletal: no clubbing / cyanosis. No joint deformity upper and lower extremities. Good ROM, no contractures. Normal muscle tone.  Skin: no rashes, lesions, ulcers. No induration Neurologic: Appears to have some right-sided weakness which is apparently old due to his recent CVA in February 2024.  Labs on Admission: I have personally reviewed following labs and imaging studies  CBC: Recent Labs  Lab 07/17/23 1443  WBC 7.8  NEUTROABS 4.9  HGB 9.0*  HCT 28.4*  MCV 89.9  PLT 213   Basic Metabolic Panel: Recent Labs  Lab 07/17/23 1635  NA 139  K 4.3  CL 107  CO2 21*  GLUCOSE 98  BUN 26*  CREATININE 1.31*  CALCIUM  9.0   GFR: Estimated Creatinine Clearance: 28.6 mL/min (A) (by C-G formula based on SCr of 1.31 mg/dL (H)). Liver Function Tests: Recent Labs  Lab 07/17/23 1635  AST 21  ALT 11  ALKPHOS 66  BILITOT 0.8  PROT 7.3  ALBUMIN 3.6   No results for input(s): LIPASE, AMYLASE in the last 168 hours. No results for input(s): AMMONIA in the last 168 hours. Coagulation Profile: Recent Labs  Lab 07/17/23 1443  INR 1.3*   Cardiac Enzymes: No results for input(s): CKTOTAL, CKMB, CKMBINDEX, TROPONINI in the last 168 hours. BNP (last 3 results) No results  for input(s): PROBNP in the last 8760 hours. HbA1C: No results for input(s): HGBA1C in the last 72 hours. CBG: No results for input(s): GLUCAP in the last 168 hours. Lipid Profile: No results for input(s): CHOL, HDL, LDLCALC, TRIG, CHOLHDL, LDLDIRECT in the last 72 hours. Thyroid  Function Tests: No results for input(s): TSH, T4TOTAL, FREET4, T3FREE, THYROIDAB in the last 72 hours. Anemia Panel: No results for input(s): VITAMINB12, FOLATE, FERRITIN, TIBC, IRON , RETICCTPCT in the last 72 hours. Urine analysis:    Component Value Date/Time   COLORURINE YELLOW 02/10/2023 1808   APPEARANCEUR CLEAR 02/10/2023 1808   LABSPEC 1.012 02/10/2023 1808  PHURINE 6.0 02/10/2023 1808   GLUCOSEU NEGATIVE 02/10/2023 1808   HGBUR NEGATIVE 02/10/2023 1808   BILIRUBINUR NEGATIVE 02/10/2023 1808   KETONESUR NEGATIVE 02/10/2023 1808   PROTEINUR NEGATIVE 02/10/2023 1808   UROBILINOGEN 0.2 05/04/2013 0337   NITRITE NEGATIVE 02/10/2023 1808   LEUKOCYTESUR NEGATIVE 02/10/2023 1808    Radiological Exams on Admission: CT Head Wo Contrast Result Date: 07/17/2023 CLINICAL DATA:  Mental status change, unknown cause EXAM: CT HEAD WITHOUT CONTRAST TECHNIQUE: Contiguous axial images were obtained from the base of the skull through the vertex without intravenous contrast. RADIATION DOSE REDUCTION: This exam was performed according to the departmental dose-optimization program which includes automated exposure control, adjustment of the mA and/or kV according to patient size and/or use of iterative reconstruction technique. COMPARISON:  Brain MR 01/20/2023, CT head 11/12/2022 FINDINGS: Brain: Chronic infarcts in the bilateral thalami, left cerebellum, bilateral basal ganglia, left parietal lobe. No hemorrhage. No hydrocephalus. No extra-axial fluid collection. No CT evidence of an acute cortical infarct. No mass effect. No mass lesion. There is a background of moderate to severe  chronic microvascular ischemic change. There is generalized volume loss slight temporal lobe predominance Vascular: No hyperdense vessel or unexpected calcification. Skull: Normal. Negative for fracture or focal lesion. Sinuses/Orbits: No middle ear or mastoid effusion. Frothy secretions in the right sphenoid and left maxillary sinus. Orbits are unremarkable. Other: None. IMPRESSION: 1. No CT etiology for altered mental status identified 2. Chronic infarcts in the bilateral thalami, left cerebellum, bilateral basal ganglia, and left parietal lobe. 3. Frothy secretions in the right sphenoid and left maxillary sinus. Correlate for symptoms of acute sinusitis. Electronically Signed   By: Lyndall Gore M.D.   On: 07/17/2023 12:37   DG Chest Port 1 View Result Date: 07/17/2023 CLINICAL DATA:  Questionable sepsis EXAM: PORTABLE CHEST 1 VIEW COMPARISON:  02/10/2023 FINDINGS: Low volume chest with elevated right diaphragm. There is no edema, consolidation, effusion, or pneumothorax. Normal heart size and mediastinal contours. Extensive artifact from EKG leads. The chin overlaps the right apex. IMPRESSION: Limited low volume chest without acute finding. Electronically Signed   By: Dorn Roulette M.D.   On: 07/17/2023 12:22    Assessment/Plan Principal Problem:   Generalized weakness   Lethargy/generalized weakness/dehydration: Received 1 L in the ED, I will start him on normal saline at 100 cc/h for 20 hours.  Labs are looking normal.  Repeat labs in the morning.  Patient's respiratory viral panel such as COVID, RSV and influenza is pending.  Based on clinical exam, I do not suspect acute sinusitis as indicated in the CT head.  Lactic acid, urinalysis and blood culture have yet to be collected.  I tried calling the daughter so I can gather information about his baseline but no one picked up the phone.  History of seizure disorder: He is on Keppra .  There was some concern of seizure by family members however when  he was assessed by ED physician, he was tremulous and was not answering questions or following commands.  But when I saw him, he is answering questions and trying to follow commands.  I will resume Keppra .  Check Keppra  level.  Essential hypertension: Blood pressure controlled.  Resume home medications.  Chronic normocytic anemia: Stable.  Hyperlipidemia: Resume statin.  Appears to have history of dementia: Resume Namenda .  GERD: Resume PPI.  DVT prophylaxis: enoxaparin  (LOVENOX ) injection 40 mg Start: 07/17/23 1800 Code Status: Full code based on the previous records.  Patient unable to express his wishes,  unable to contact his daughter. Family Communication: None present at bedside.   Disposition Plan: Unsure whether this is his baseline, may end up going back to facility by tomorrow. Consults called: None  Fredia Skeeter MD [facility]  *Please note that this is a verbal dictation therefore any spelling or grammatical errors are due to the Dragon Medical One system interpretation.  Please page via Amion and do not message via secure chat for urgent patient care matters. Secure chat can be used for non urgent patient care matters. 07/17/2023, 5:56 PM  To contact the attending provider between 7A-7P or the covering provider during after hours 7P-7A, please log into the web site [email or web address]

## 2023-07-18 DIAGNOSIS — R531 Weakness: Secondary | ICD-10-CM | POA: Diagnosis not present

## 2023-07-18 LAB — CBC
HCT: 25.2 % — ABNORMAL LOW (ref 39.0–52.0)
Hemoglobin: 8 g/dL — ABNORMAL LOW (ref 13.0–17.0)
MCH: 28.8 pg (ref 26.0–34.0)
MCHC: 31.7 g/dL (ref 30.0–36.0)
MCV: 90.6 fL (ref 80.0–100.0)
Platelets: 182 10*3/uL (ref 150–400)
RBC: 2.78 MIL/uL — ABNORMAL LOW (ref 4.22–5.81)
RDW: 13.1 % (ref 11.5–15.5)
WBC: 6.3 10*3/uL (ref 4.0–10.5)
nRBC: 0 % (ref 0.0–0.2)

## 2023-07-18 LAB — RESP PANEL BY RT-PCR (RSV, FLU A&B, COVID)  RVPGX2
Influenza A by PCR: NEGATIVE
Influenza B by PCR: NEGATIVE
Resp Syncytial Virus by PCR: NEGATIVE
SARS Coronavirus 2 by RT PCR: NEGATIVE

## 2023-07-18 LAB — BASIC METABOLIC PANEL
Anion gap: 8 (ref 5–15)
BUN: 21 mg/dL (ref 8–23)
CO2: 21 mmol/L — ABNORMAL LOW (ref 22–32)
Calcium: 8.3 mg/dL — ABNORMAL LOW (ref 8.9–10.3)
Chloride: 107 mmol/L (ref 98–111)
Creatinine, Ser: 1.19 mg/dL (ref 0.61–1.24)
GFR, Estimated: 58 mL/min — ABNORMAL LOW (ref >60)
Glucose, Bld: 84 mg/dL (ref 70–99)
Potassium: 3.9 mmol/L (ref 3.5–5.1)
Sodium: 136 mmol/L (ref 135–145)

## 2023-07-18 NOTE — ED Notes (Signed)
 Attempted to give report to nurse at [facility] but no answer. Arranging [facility] transportation now

## 2023-07-18 NOTE — ED Notes (Signed)
 Pts brief changed

## 2023-07-18 NOTE — Discharge Summary (Signed)
 Physician Discharge Summary  Carlos Shields FMW:[id number] DOB: 10-06-1930 DOA: 07/17/2023  PCP: Carlos Rosina SAILOR, PA  Admit date: 07/17/2023 Discharge date: 07/18/2023    Admitted From: SNF Disposition: SNF  Recommendations for Outpatient Follow-up:  Follow up with PCP in 1-2 weeks Please obtain BMP/CBC in one week Please follow up with your PCP on the following pending results: Unresulted Labs (From admission, onward)     Start     Ordered   07/24/23 0500  Creatinine, serum  (enoxaparin  (LOVENOX )    CrCl >/= 30 ml/min)  Weekly,   R     Comments: while on enoxaparin  therapy    07/17/23 1756   07/18/23 1119  Resp panel by RT-PCR (RSV, Flu A&B, Covid) Anterior Nasal Swab  Once,   R        07/18/23 1118   07/17/23 1803  Levetiracetam  level  Once,   R        07/17/23 1802   07/17/23 1152  Blood Culture (routine x 2)  (Undifferentiated presentation (screening labs and basic nursing orders))  BLOOD CULTURE X 2,   STAT      07/17/23 1152   07/17/23 1152  Resp panel by RT-PCR (RSV, Flu A&B, Covid) Anterior Nasal Swab  Once,   URGENT        07/17/23 1152              Home Health: None Equipment/Devices: None  Discharge Condition: Stable CODE STATUS: Full code Diet recommendation: Cardiac  HPI: Carlos Shields is a 89 y.o. male with medical history significant of  seizure on antiepileptics, CVA, hypertension, CKD stage IIIa, dysphagia who was brought in from nursing home due to reportedly fever at the facility along with lethargy and hypotension.  He has a history of CVA leading to right facial droop and aphasia and weakness to the right side.  Reportedly also was confused.  Facility noted that he was looking different.  There has been influenza and COVID at the nursing facility.  Per ED physician, family noted that he had an episode of shaking and not answering questions when he arrived here.  They were concerned about seizure.  When I saw this patient myself, patient was alert and oriented to  himself and month but not the place or the year.  He did not have any complaint.  Appears very weak.  I am not sure what his baseline is.  I tried calling patient's daughter but no response.   ED Course: Upon arrival, he was slightly tachycardic but rest of the vitals were stable.  He was not hypoxic and he was afebrile.  Chest x-ray negative CT head negative as well.  All the labs are within normal range as well.  CT head shows possibility of sinusitis however patient did not have any complaint and no tenderness. Hospital service was called for admission for basically lethargy.  At the time I was called and at the time of this dictation, patient's COVID/RSV/influenza is still pending.  All he received in the ED was 1 L of IV fluid.    Subjective: Seen and examined this morning, patient was fully alert and mostly oriented, confirmed with daughter who I was able to get in touch over the phone with, she confirmed that this is his baseline.  Patient was able to tell me he is in the hospital, his name, his date of birth even the month but he missed the year.  Per daughter, this is typical for him.  Brief/Interim Summary: Patient was admitted only overnight in the hospital, was sent from the facility for lethargy and generalized weakness, appeared to have dehydration clinically.  Received IV fluids overnight.  Has CKD stage IIIb with baseline creatinine around 1.7, came in with 1.31, after hydration actually his creatinine improved better than his baseline to 1.19.  Has chronic anemia, hemoglobin is stable.  No leukocytosis or fever.  Electrolytes are stable. UA unremarkable.  There were some concerns brought up by the family that patient did have an episode of seizure but clinically did not appear to have 1.  He was resumed on Keppra , Keppra  level was drawn which is still pending.  He was tested for COVID but result is still pending as well however patient did not offer any respiratory symptoms and chest x-ray  was unremarkable and he was not hypoxic so even if he would be positive, there will be no treatment indicated for that. Patient stable and back to his baseline so he was discharged back to facility with resumption of all his medications.  Discharge plan was discussed with patient and/or family member and they verbalized understanding and agreed with it.  Discharge Diagnoses:  Principal Problem:   Generalized weakness    Discharge Instructions   Allergies as of 07/18/2023   No Known Allergies      Medication List     TAKE these medications    aspirin  EC 81 MG tablet Take 81 mg by mouth daily. Swallow whole.   atorvastatin  20 MG tablet Commonly known as: LIPITOR Take 20 mg by mouth at bedtime.   gabapentin  100 MG capsule Commonly known as: NEURONTIN  Take 100 mg by mouth 3 (three) times daily.   levETIRAcetam  500 MG tablet Commonly known as: Keppra  Take 1 tablet (500 mg total) by mouth 2 (two) times daily.   memantine  10 MG tablet Commonly known as: NAMENDA  Take 1 tablet (10 mg total) by mouth 2 (two) times daily.   metoprolol  tartrate 25 MG tablet Commonly known as: LOPRESSOR  Take 0.5 tablets (12.5 mg total) by mouth 2 (two) times daily.   Nutritional Supplement Liqd Take 120 mLs by mouth daily. Med[employer]   pantoprazole  40 MG tablet Commonly known as: PROTONIX  Take 1 tablet (40 mg total) by mouth daily. What changed: when to take this        Follow-up Information     Carlos Rosina SAILOR, PA Follow up in 1 week(s).   Specialty: Physician Assistant Contact information: [address] [place] [address] [phone]                No Known Allergies  Consultations: None   Procedures/Studies: CT Head Wo Contrast Result Date: 07/17/2023 CLINICAL DATA:  Mental status change, unknown cause EXAM: CT HEAD WITHOUT CONTRAST TECHNIQUE: Contiguous axial images were obtained from the base of the skull through the vertex without intravenous contrast.  RADIATION DOSE REDUCTION: This exam was performed according to the departmental dose-optimization program which includes automated exposure control, adjustment of the mA and/or kV according to patient size and/or use of iterative reconstruction technique. COMPARISON:  Brain MR 01/20/2023, CT head 11/12/2022 FINDINGS: Brain: Chronic infarcts in the bilateral thalami, left cerebellum, bilateral basal ganglia, left parietal lobe. No hemorrhage. No hydrocephalus. No extra-axial fluid collection. No CT evidence of an acute cortical infarct. No mass effect. No mass lesion. There is a background of moderate to severe chronic microvascular ischemic change. There is generalized volume loss slight temporal lobe predominance Vascular: No hyperdense vessel  or unexpected calcification. Skull: Normal. Negative for fracture or focal lesion. Sinuses/Orbits: No middle ear or mastoid effusion. Frothy secretions in the right sphenoid and left maxillary sinus. Orbits are unremarkable. Other: None. IMPRESSION: 1. No CT etiology for altered mental status identified 2. Chronic infarcts in the bilateral thalami, left cerebellum, bilateral basal ganglia, and left parietal lobe. 3. Frothy secretions in the right sphenoid and left maxillary sinus. Correlate for symptoms of acute sinusitis. Electronically Signed   By: Lyndall Gore M.D.   On: 07/17/2023 12:37   DG Chest Port 1 View Result Date: 07/17/2023 CLINICAL DATA:  Questionable sepsis EXAM: PORTABLE CHEST 1 VIEW COMPARISON:  02/10/2023 FINDINGS: Low volume chest with elevated right diaphragm. There is no edema, consolidation, effusion, or pneumothorax. Normal heart size and mediastinal contours. Extensive artifact from EKG leads. The chin overlaps the right apex. IMPRESSION: Limited low volume chest without acute finding. Electronically Signed   By: Dorn Roulette M.D.   On: 07/17/2023 12:22     Discharge Exam: Vitals:   07/18/23 0621 07/18/23 0900  BP:  136/71  Pulse:  78   Resp:  18  Temp: 98.4 F (36.9 C)   SpO2:  100%   Vitals:   07/18/23 0300 07/18/23 0600 07/18/23 0621 07/18/23 0900  BP: (!) 91/52 112/61  136/71  Pulse: 73 72  78  Resp: 16 16  18   Temp:   98.4 F (36.9 C)   TempSrc:   Oral   SpO2: 100% 100%  100%  Weight:      Height:        General: Pt is alert, awake, not in acute distress Cardiovascular: RRR, S1/S2 +, no rubs, no gallops Respiratory: CTA bilaterally, no wheezing, no rhonchi Abdominal: Soft, NT, ND, bowel sounds + Extremities: no edema, no cyanosis    The results of significant diagnostics from this hospitalization (including imaging, microbiology, ancillary and laboratory) are listed below for reference.     Microbiology: Recent Results (from the past 240 hours)  Blood Culture (routine x 2)     Status: None (Preliminary result)   Collection Time: 07/17/23  4:35 PM   Specimen: BLOOD RIGHT WRIST  Result Value Ref Range Status   Specimen Description BLOOD RIGHT WRIST  Final   Special Requests   Final    AEROBIC BOTTLE ONLY Blood Culture results may not be optimal due to an inadequate volume of blood received in culture bottles   Culture   Final    NO GROWTH < 24 HOURS Performed at [facility], [address] [place], [place] [address]    Report Status PENDING  Incomplete     Labs: BNP (last 3 results) No results for input(s): BNP in the last 8760 hours. Basic Metabolic Panel: Recent Labs  Lab 07/17/23 1635 07/18/23 0830  NA 139 136  K 4.3 3.9  CL 107 107  CO2 21* 21*  GLUCOSE 98 84  BUN 26* 21  CREATININE 1.31* 1.19  CALCIUM  9.0 8.3*   Liver Function Tests: Recent Labs  Lab 07/17/23 1635  AST 21  ALT 11  ALKPHOS 66  BILITOT 0.8  PROT 7.3  ALBUMIN 3.6   No results for input(s): LIPASE, AMYLASE in the last 168 hours. No results for input(s): AMMONIA in the last 168 hours. CBC: Recent Labs  Lab 07/17/23 1443 07/18/23 0830  WBC 7.8 6.3  NEUTROABS 4.9  --   HGB 9.0* 8.0*   HCT 28.4* 25.2*  MCV 89.9 90.6  PLT  213 182   Cardiac Enzymes: No results for input(s): CKTOTAL, CKMB, CKMBINDEX, TROPONINI in the last 168 hours. BNP: Invalid input(s): POCBNP CBG: No results for input(s): GLUCAP in the last 168 hours. D-Dimer No results for input(s): DDIMER in the last 72 hours. Hgb A1c No results for input(s): HGBA1C in the last 72 hours. Lipid Profile No results for input(s): CHOL, HDL, LDLCALC, TRIG, CHOLHDL, LDLDIRECT in the last 72 hours. Thyroid  function studies No results for input(s): TSH, T4TOTAL, T3FREE, THYROIDAB in the last 72 hours.  Invalid input(s): FREET3 Anemia work up No results for input(s): VITAMINB12, FOLATE, FERRITIN, TIBC, IRON , RETICCTPCT in the last 72 hours. Urinalysis    Component Value Date/Time   COLORURINE STRAW (A) 07/17/2023 1957   APPEARANCEUR CLEAR 07/17/2023 1957   LABSPEC 1.010 07/17/2023 1957   PHURINE 7.0 07/17/2023 1957   GLUCOSEU NEGATIVE 07/17/2023 1957   HGBUR NEGATIVE 07/17/2023 1957   BILIRUBINUR NEGATIVE 07/17/2023 1957   KETONESUR NEGATIVE 07/17/2023 1957   PROTEINUR NEGATIVE 07/17/2023 1957   UROBILINOGEN 0.2 05/04/2013 0337   NITRITE NEGATIVE 07/17/2023 1957   LEUKOCYTESUR NEGATIVE 07/17/2023 1957   Sepsis Labs Recent Labs  Lab 07/17/23 1443 07/18/23 0830  WBC 7.8 6.3   Microbiology Recent Results (from the past 240 hours)  Blood Culture (routine x 2)     Status: None (Preliminary result)   Collection Time: 07/17/23  4:35 PM   Specimen: BLOOD RIGHT WRIST  Result Value Ref Range Status   Specimen Description BLOOD RIGHT WRIST  Final   Special Requests   Final    AEROBIC BOTTLE ONLY Blood Culture results may not be optimal due to an inadequate volume of blood received in culture bottles   Culture   Final    NO GROWTH < 24 HOURS Performed at [facility], [address] [place], [place] [address]    Report Status PENDING  Incomplete     FURTHER DISCHARGE INSTRUCTIONS:   Get Medicines reviewed and adjusted: Please take all your medications with you for your next visit with your Primary MD   Laboratory/radiological data: Please request your Primary MD to go over all hospital tests and procedure/radiological results at the follow up, please ask your Primary MD to get all Hospital records sent to his/her office.   In some cases, they will be blood work, cultures and biopsy results pending at the time of your discharge. Please request that your primary care M.D. goes through all the records of your hospital data and follows up on these results.   Also Note the following: If you experience worsening of your admission symptoms, develop shortness of breath, life threatening emergency, suicidal or homicidal thoughts you must seek medical attention immediately by calling 911 or calling your MD immediately  if symptoms less severe.   You must read complete instructions/literature along with all the possible adverse reactions/side effects for all the Medicines you take and that have been prescribed to you. Take any new Medicines after you have completely understood and accpet all the possible adverse reactions/side effects.    Do not drive when taking Pain medications or sleeping medications (Benzodaizepines)   Do not take more than prescribed Pain, Sleep and Anxiety Medications. It is not advisable to combine anxiety,sleep and pain medications without talking with your primary care practitioner   Special Instructions: If you have smoked or chewed Tobacco  in the last 2 yrs please stop smoking, stop any regular Alcohol   and or any Recreational drug use.  Wear Seat belts while driving.   Please note: You were cared for by a hospitalist during your hospital stay. Once you are discharged, your primary care physician will handle any further medical issues. Please note that NO REFILLS for any discharge medications will be authorized  once you are discharged, as it is imperative that you return to your primary care physician (or establish a relationship with a primary care physician if you do not have one) for your post hospital discharge needs so that they can reassess your need for medications and monitor your lab values  Time coordinating discharge: Over 30 minutes  SIGNED:   Fredia Skeeter, MD  [facility] 07/18/2023, 11:57 AM *Please note that this is a verbal dictation therefore any spelling or grammatical errors are due to the Dragon Medical One system interpretation. If 7PM-7AM, please contact night-coverage [email or web address]

## 2023-07-19 LAB — LEVETIRACETAM LEVEL: Levetiracetam Lvl: 9.7 ug/mL — ABNORMAL LOW (ref 10.0–40.0)

## 2023-07-22 LAB — CULTURE, BLOOD (ROUTINE X 2): Culture: NO GROWTH

## 2023-08-31 ENCOUNTER — Observation Stay (HOSPITAL_COMMUNITY)
Admission: EM | Admit: 2023-08-31 | Discharge: 2023-09-10 | Disposition: E | Payer: Medicare Other | Attending: Internal Medicine | Admitting: Internal Medicine

## 2023-08-31 ENCOUNTER — Emergency Department (HOSPITAL_COMMUNITY): Payer: Medicare Other

## 2023-08-31 ENCOUNTER — Other Ambulatory Visit: Payer: Self-pay

## 2023-08-31 DIAGNOSIS — E872 Acidosis, unspecified: Secondary | ICD-10-CM | POA: Diagnosis not present

## 2023-08-31 DIAGNOSIS — I129 Hypertensive chronic kidney disease with stage 1 through stage 4 chronic kidney disease, or unspecified chronic kidney disease: Secondary | ICD-10-CM | POA: Diagnosis not present

## 2023-08-31 DIAGNOSIS — A419 Sepsis, unspecified organism: Principal | ICD-10-CM | POA: Diagnosis present

## 2023-08-31 DIAGNOSIS — G9341 Metabolic encephalopathy: Secondary | ICD-10-CM | POA: Diagnosis not present

## 2023-08-31 DIAGNOSIS — R1310 Dysphagia, unspecified: Secondary | ICD-10-CM | POA: Diagnosis not present

## 2023-08-31 DIAGNOSIS — N1832 Chronic kidney disease, stage 3b: Secondary | ICD-10-CM | POA: Insufficient documentation

## 2023-08-31 DIAGNOSIS — Z79899 Other long term (current) drug therapy: Secondary | ICD-10-CM | POA: Insufficient documentation

## 2023-08-31 DIAGNOSIS — N179 Acute kidney failure, unspecified: Secondary | ICD-10-CM | POA: Insufficient documentation

## 2023-08-31 DIAGNOSIS — R0603 Acute respiratory distress: Secondary | ICD-10-CM | POA: Diagnosis present

## 2023-08-31 DIAGNOSIS — R54 Age-related physical debility: Secondary | ICD-10-CM | POA: Insufficient documentation

## 2023-08-31 DIAGNOSIS — E43 Unspecified severe protein-calorie malnutrition: Secondary | ICD-10-CM | POA: Insufficient documentation

## 2023-08-31 DIAGNOSIS — Z87891 Personal history of nicotine dependence: Secondary | ICD-10-CM | POA: Insufficient documentation

## 2023-08-31 DIAGNOSIS — F0390 Unspecified dementia without behavioral disturbance: Secondary | ICD-10-CM | POA: Insufficient documentation

## 2023-08-31 DIAGNOSIS — K56609 Unspecified intestinal obstruction, unspecified as to partial versus complete obstruction: Secondary | ICD-10-CM | POA: Diagnosis not present

## 2023-08-31 DIAGNOSIS — J9601 Acute respiratory failure with hypoxia: Secondary | ICD-10-CM | POA: Insufficient documentation

## 2023-08-31 DIAGNOSIS — Z7982 Long term (current) use of aspirin: Secondary | ICD-10-CM | POA: Insufficient documentation

## 2023-08-31 DIAGNOSIS — J690 Pneumonitis due to inhalation of food and vomit: Secondary | ICD-10-CM

## 2023-08-31 DIAGNOSIS — K56600 Partial intestinal obstruction, unspecified as to cause: Secondary | ICD-10-CM

## 2023-08-31 DIAGNOSIS — R6521 Severe sepsis with septic shock: Secondary | ICD-10-CM | POA: Insufficient documentation

## 2023-08-31 LAB — CBC WITH DIFFERENTIAL/PLATELET
Abs Immature Granulocytes: 0.1 10*3/uL — ABNORMAL HIGH (ref 0.00–0.07)
Band Neutrophils: 1 %
Basophils Absolute: 0 10*3/uL (ref 0.0–0.1)
Basophils Relative: 0 %
Eosinophils Absolute: 0 10*3/uL (ref 0.0–0.5)
Eosinophils Relative: 0 %
HCT: 42.9 % (ref 39.0–52.0)
Hemoglobin: 13 g/dL (ref 13.0–17.0)
Lymphocytes Relative: 63 %
Lymphs Abs: 3.4 10*3/uL (ref 0.7–4.0)
MCH: 28 pg (ref 26.0–34.0)
MCHC: 30.3 g/dL (ref 30.0–36.0)
MCV: 92.3 fL (ref 80.0–100.0)
Metamyelocytes Relative: 2 %
Monocytes Absolute: 0.2 10*3/uL (ref 0.1–1.0)
Monocytes Relative: 4 %
Neutro Abs: 1.7 10*3/uL (ref 1.7–7.7)
Neutrophils Relative %: 30 %
Platelets: 272 10*3/uL (ref 150–400)
RBC: 4.65 MIL/uL (ref 4.22–5.81)
RDW: 13.3 % (ref 11.5–15.5)
WBC: 5.4 10*3/uL (ref 4.0–10.5)
nRBC: 0 % (ref 0.0–0.2)
nRBC: 0 /100{WBCs}

## 2023-08-31 LAB — I-STAT CHEM 8, ED
BUN: 52 mg/dL — ABNORMAL HIGH (ref 8–23)
Calcium, Ion: 1.21 mmol/L (ref 1.15–1.40)
Chloride: 111 mmol/L (ref 98–111)
Creatinine, Ser: 2.3 mg/dL — ABNORMAL HIGH (ref 0.61–1.24)
Glucose, Bld: 155 mg/dL — ABNORMAL HIGH (ref 70–99)
HCT: 44 % (ref 39.0–52.0)
Hemoglobin: 15 g/dL (ref 13.0–17.0)
Potassium: 3.9 mmol/L (ref 3.5–5.1)
Sodium: 141 mmol/L (ref 135–145)
TCO2: 18 mmol/L — ABNORMAL LOW (ref 22–32)

## 2023-08-31 LAB — I-STAT ARTERIAL BLOOD GAS, ED
Acid-base deficit: 19 mmol/L — ABNORMAL HIGH (ref 0.0–2.0)
Acid-base deficit: 19 mmol/L — ABNORMAL HIGH (ref 0.0–2.0)
Bicarbonate: 9.5 mmol/L — ABNORMAL LOW (ref 20.0–28.0)
Bicarbonate: 8.6 mmol/L — ABNORMAL LOW (ref 20.0–28.0)
Calcium, Ion: 1.12 mmol/L — ABNORMAL LOW (ref 1.15–1.40)
Calcium, Ion: 1.06 mmol/L — ABNORMAL LOW (ref 1.15–1.40)
HCT: 33 % — ABNORMAL LOW (ref 39.0–52.0)
HCT: 30 % — ABNORMAL LOW (ref 39.0–52.0)
Hemoglobin: 11.2 g/dL — ABNORMAL LOW (ref 13.0–17.0)
Hemoglobin: 10.2 g/dL — ABNORMAL LOW (ref 13.0–17.0)
O2 Saturation: 100 %
O2 Saturation: 93 %
Potassium: 3.5 mmol/L (ref 3.5–5.1)
Potassium: 3.4 mmol/L — ABNORMAL LOW (ref 3.5–5.1)
Sodium: 145 mmol/L (ref 135–145)
Sodium: 146 mmol/L — ABNORMAL HIGH (ref 135–145)
TCO2: 10 mmol/L — ABNORMAL LOW (ref 22–32)
TCO2: 9 mmol/L — ABNORMAL LOW (ref 22–32)
pCO2 arterial: 30.2 mm[Hg] — ABNORMAL LOW (ref 32–48)
pCO2 arterial: 27.3 mm[Hg] — ABNORMAL LOW (ref 32–48)
pH, Arterial: 7.103 — CL (ref 7.35–7.45)
pH, Arterial: 7.108 — CL (ref 7.35–7.45)
pO2, Arterial: 361 mm[Hg] — ABNORMAL HIGH (ref 83–108)
pO2, Arterial: 90 mm[Hg] (ref 83–108)

## 2023-08-31 LAB — COMPREHENSIVE METABOLIC PANEL
ALT: 9 U/L (ref 0–44)
AST: 27 U/L (ref 15–41)
Albumin: 3.7 g/dL (ref 3.5–5.0)
Alkaline Phosphatase: 52 U/L (ref 38–126)
Anion gap: 23 — ABNORMAL HIGH (ref 5–15)
BUN: 58 mg/dL — ABNORMAL HIGH (ref 8–23)
CO2: 13 mmol/L — ABNORMAL LOW (ref 22–32)
Calcium: 9.3 mg/dL (ref 8.9–10.3)
Chloride: 104 mmol/L (ref 98–111)
Creatinine, Ser: 2.56 mg/dL — ABNORMAL HIGH (ref 0.61–1.24)
GFR, Estimated: 23 mL/min — ABNORMAL LOW (ref >60)
Glucose, Bld: 164 mg/dL — ABNORMAL HIGH (ref 70–99)
Potassium: 4 mmol/L (ref 3.5–5.1)
Sodium: 140 mmol/L (ref 135–145)
Total Bilirubin: 1.1 mg/dL (ref 0.0–1.2)
Total Protein: 7.8 g/dL (ref 6.5–8.1)

## 2023-08-31 LAB — I-STAT CG4 LACTIC ACID, ED: Lactic Acid, Venous: 9.6 mmol/L (ref 0.5–1.9)

## 2023-08-31 LAB — CBG MONITORING, ED: Glucose-Capillary: 123 mg/dL — ABNORMAL HIGH (ref 70–99)

## 2023-08-31 MED ORDER — FENTANYL 2500MCG IN NS 250ML (10MCG/ML) PREMIX INFUSION: 25.0000 ug/h | INTRAVENOUS | Status: DC

## 2023-08-31 MED ORDER — SODIUM CHLORIDE 0.9 % IV BOLUS
2000.0000 mL | Freq: Once | INTRAVENOUS | Status: AC
  Administered 2023-08-31: 2000 mL via INTRAVENOUS

## 2023-08-31 MED ORDER — HYDROMORPHONE HCL-NACL 50-0.9 MG/50ML-% IV SOLN
0.0000 mg/h | INTRAVENOUS | Status: DC
  Administered 2023-08-31 (×2): 1 mg/h via INTRAVENOUS
  Filled 2023-08-31 (×2): qty 50

## 2023-08-31 MED ORDER — NOREPINEPHRINE 4 MG/250ML-% IV SOLN
0.0000 ug/min | INTRAVENOUS | Status: DC
  Administered 2023-08-31: 5 ug/min via INTRAVENOUS

## 2023-08-31 MED ORDER — ACETAMINOPHEN 650 MG RE SUPP: 650.0000 mg | Freq: Four times a day (QID) | RECTAL | Status: DC | PRN

## 2023-08-31 MED ORDER — SODIUM CHLORIDE 0.9% FLUSH: 3.0000 mL | INTRAVENOUS | Status: DC | PRN

## 2023-08-31 MED ORDER — GLYCOPYRROLATE 0.2 MG/ML IJ SOLN: 0.2000 mg | INTRAMUSCULAR | Status: DC | PRN

## 2023-08-31 MED ORDER — SODIUM CHLORIDE 0.9 % IV SOLN
2.0000 g | Freq: Once | INTRAVENOUS | Status: AC
  Administered 2023-08-31: 2 g via INTRAVENOUS

## 2023-08-31 MED ORDER — LORAZEPAM 2 MG/ML IJ SOLN: 2.0000 mg | INTRAMUSCULAR | Status: DC | PRN

## 2023-08-31 MED ORDER — POLYVINYL ALCOHOL 1.4 % OP SOLN: 1.0000 [drp] | Freq: Four times a day (QID) | OPHTHALMIC | Status: DC | PRN

## 2023-08-31 MED ORDER — FENTANYL BOLUS VIA INFUSION: 25.0000 ug | INTRAVENOUS | Status: DC | PRN

## 2023-08-31 MED ORDER — MIDAZOLAM HCL 2 MG/2ML IJ SOLN: 1.0000 mg | INTRAMUSCULAR | Status: DC | PRN

## 2023-08-31 MED ORDER — DOCUSATE SODIUM 50 MG/5ML PO LIQD
100.0000 mg | Freq: Two times a day (BID) | ORAL | Status: DC
Start: 2023-08-31 — End: 2023-08-31

## 2023-08-31 MED ORDER — VANCOMYCIN HCL IN DEXTROSE 1-5 GM/200ML-% IV SOLN
1000.0000 mg | Freq: Once | INTRAVENOUS | Status: AC
  Administered 2023-08-31: 1000 mg via INTRAVENOUS

## 2023-08-31 MED ORDER — VASOPRESSIN 20 UNITS/100 ML INFUSION FOR SHOCK
0.0000 [IU]/min | INTRAVENOUS | Status: DC
Start: 2023-08-31 — End: 2023-08-31
  Administered 2023-08-31: 0.03 [IU]/min via INTRAVENOUS

## 2023-08-31 MED ORDER — POLYETHYLENE GLYCOL 3350 17 G PO PACK: 17.0000 g | PACK | Freq: Every day | ORAL | Status: DC

## 2023-08-31 MED ORDER — SODIUM CHLORIDE 0.9% FLUSH: 3.0000 mL | Freq: Two times a day (BID) | INTRAVENOUS | Status: DC

## 2023-08-31 MED ORDER — GLYCOPYRROLATE 1 MG PO TABS: 1.0000 mg | ORAL_TABLET | ORAL | Status: DC | PRN

## 2023-08-31 MED ORDER — HYDROMORPHONE BOLUS VIA INFUSION: 1.0000 mg | INTRAVENOUS | Status: DC | PRN

## 2023-08-31 MED ORDER — ACETAMINOPHEN 325 MG PO TABS: 650.0000 mg | ORAL_TABLET | Freq: Four times a day (QID) | ORAL | Status: DC | PRN

## 2023-08-31 NOTE — ED Notes (Signed)
 Carlos Blazing MD at bedside ?

## 2023-09-04 LAB — CULTURE, BLOOD (ROUTINE X 2)

## 2023-09-05 LAB — CULTURE, BLOOD (ROUTINE X 2): Culture: NO GROWTH

## 2023-09-10 NOTE — ED Notes (Addendum)
Femoral central line attempted by Glendora Score, MD.

## 2023-09-10 NOTE — ED Notes (Addendum)
Per MD Katrinka Blazing, titrate dilaudid drip to correlate respiratory rate under 20/min. Per MD Katrinka Blazing, discontinue BiPAP once patient's respiratory rate under 20/min.

## 2023-09-10 NOTE — ED Provider Notes (Signed)
[facility] 58[facility] MEDICAL ICU Provider Note  CSN: [id number] Arrival date & time: 08/19/2023 0425  Chief Complaint(s) Respiratory Distress  HPI Carlos Shields is a 89 y.o. male with PMH HTN, CKD 3, seizure disorder, pancreatitis, dementia who presents emergency department for evaluation of altered mental status and respiratory distress.  History obtained from patient's daughter who states that he has been vomiting this morning but she called back to the facility and they informed her that the vomiting had stopped.  At some point this morning, the patient was found by nursing home staff to be critically ill with agonal breathing, hypoxia and hypotension.  EMS was called who states that facility staff was unable to provide any additional information.  Patient arrives minimally responsive with agonal respirations and unable to provide further history.   Past Medical History Past Medical History:  Diagnosis Date   CKD (chronic kidney disease) stage 3, GFR 30-59 ml/min (HCC)    Gait abnormality    Hypertension    Pancreatitis    Seizures (HCC)    Venous insufficiency    Patient Active Problem List   Diagnosis Date Noted   Septic shock (HCC) 09/03/2023   Generalized weakness 07/17/2023   Mild vascular dementia (HCC) 10/18/2022   CVA (cerebrovascular accident) (HCC) 08/14/2022   Acute-on-chronic kidney injury (HCC) 08/05/2022   CVA (cerebral vascular accident) (HCC) 08/05/2022   Dysphagia 08/05/2022   Abnormal TSH 08/05/2022   Acute metabolic encephalopathy 08/05/2022   CKD (chronic kidney disease), stage III (HCC) 04/26/2021   Weakness generalized 04/26/2021   Weakness 04/25/2021   Syncope 01/13/2012   Anemia 12/09/2011   Seizures (HCC) 12/08/2011   HTN (hypertension) 12/08/2011   Encephalopathy 12/08/2011   Home Medication(s) Prior to Admission medications   Medication Sig Start Date End Date Taking? Authorizing Provider  acetaminophen (TYLENOL) 500 MG tablet Take  1,000 mg by mouth every 8 (eight) hours as needed for mild pain (pain score 1-3) or moderate pain (pain score 4-6).   Yes [provider]  aspirin EC 81 MG tablet Take 81 mg by mouth daily. Swallow whole.   Yes [provider]  gabapentin (NEURONTIN) 100 MG capsule Take 100 mg by mouth 3 (three) times daily.   Yes [provider]  levETIRAcetam (KEPPRA) 500 MG tablet Take 1 tablet (500 mg total) by mouth 2 (two) times daily. 04/20/23 04/14/24 Yes Camara, Amalia Hailey, MD  memantine (NAMENDA) 10 MG tablet Take 1 tablet (10 mg total) by mouth 2 (two) times daily. 04/20/23 04/14/24 Yes Windell Norfolk, MD  metoprolol tartrate (LOPRESSOR) 25 MG tablet Take 0.5 tablets (12.5 mg total) by mouth 2 (two) times daily. 08/26/22  Yes Angiulli, Mcarthur Rossetti, PA-C  Nutritional Supplement LIQD Take 120 mLs by mouth daily. MedPass   Yes [provider]  pantoprazole (PROTONIX) 40 MG tablet Take 1 tablet (40 mg total) by mouth daily. Patient taking differently: Take 40 mg by mouth every other day. 08/26/22  Yes Angiulli, Mcarthur Rossetti, PA-C  Past Surgical History Past Surgical History:  Procedure Laterality Date   HERNIA REPAIR     TRANSCAROTID ARTERY REVASCULARIZATION  Left 08/12/2022   Procedure: Transcarotid Artery Revascularization;  Surgeon: Maeola Harman, MD;  Location: [facility] OR;  Service: Vascular;  Laterality: Left;   Family History Family History  Problem Relation Age of Onset   Hypertension Other    Diabetes Other    Asthma Other     Social History Social History   Tobacco Use   Smoking status: Former    Types: Cigars    Passive exposure: Past   Smokeless tobacco: Never  Vaping Use   Vaping status: Never Used  Substance Use Topics   Alcohol use: No   Drug use: No   Allergies Patient has no known allergies.  Review of  Systems Review of Systems  Unable to perform ROS: Mental status change   Physical Exam Vital Signs  I have reviewed the triage vital signs BP (!) 108/57   Pulse 79   Temp (!) 94 F (34.4 C) (Axillary)   Resp (!) 0   SpO2 (!) 70%   Physical Exam Vitals and nursing note reviewed.  Constitutional:      General: He is in acute distress.     Appearance: He is well-developed. He is toxic-appearing and diaphoretic.  HENT:     Head: Normocephalic and atraumatic.  Cardiovascular:     Heart sounds: No murmur heard. Pulmonary:     Effort: Respiratory distress present.  Abdominal:     Palpations: Abdomen is soft.     Tenderness: There is no abdominal tenderness.  Musculoskeletal:        General: No swelling.     Cervical back: Neck supple.  Skin:    Capillary Refill: Capillary refill takes more than 3 seconds.     Coloration: Skin is pale.  Neurological:     Mental Status: He is alert.     ED Results and Treatments Labs (all labs ordered are listed, but only abnormal results are displayed) Labs Reviewed  COMPREHENSIVE METABOLIC PANEL - Abnormal; Notable for the following components:      Result Value   CO2 13 (*)    Glucose, Bld 164 (*)    BUN 58 (*)    Creatinine, Ser 2.56 (*)    GFR, Estimated 23 (*)    Anion gap 23 (*)    All other components within normal limits  CBC WITH DIFFERENTIAL/PLATELET - Abnormal; Notable for the following components:   Abs Immature Granulocytes 0.10 (*)    All other components within normal limits  I-STAT CHEM 8, ED - Abnormal; Notable for the following components:   BUN 52 (*)    Creatinine, Ser 2.30 (*)    Glucose, Bld 155 (*)    TCO2 18 (*)    All other components within normal limits  I-STAT CG4 LACTIC ACID, ED - Abnormal; Notable for the following components:   Lactic Acid, Venous 9.6 (*)    All other components within normal limits  I-STAT ARTERIAL BLOOD GAS, ED - Abnormal; Notable for the following components:   pH, Arterial  7.103 (*)    pCO2 arterial 30.2 (*)    pO2, Arterial 361 (*)    Bicarbonate 9.5 (*)    TCO2 10 (*)    Acid-base deficit 19.0 (*)    Calcium, Ion 1.12 (*)    HCT 33.0 (*)    Hemoglobin 11.2 (*)    All other components within normal  limits  I-STAT ARTERIAL BLOOD GAS, ED - Abnormal; Notable for the following components:   pH, Arterial 7.108 (*)    pCO2 arterial 27.3 (*)    Bicarbonate 8.6 (*)    TCO2 9 (*)    Acid-base deficit 19.0 (*)    Sodium 146 (*)    Potassium 3.4 (*)    Calcium, Ion 1.06 (*)    HCT 30.0 (*)    Hemoglobin 10.2 (*)    All other components within normal limits  CBG MONITORING, ED - Abnormal; Notable for the following components:   Glucose-Capillary 123 (*)    All other components within normal limits  CULTURE, BLOOD (ROUTINE X 2)  CULTURE, BLOOD (ROUTINE X 2)  RESP PANEL BY RT-PCR (RSV, FLU A&B, COVID)  RVPGX2  URINALYSIS, W/ REFLEX TO CULTURE (INFECTION SUSPECTED)  I-STAT CG4 LACTIC ACID, ED  I-STAT CG4 LACTIC ACID, ED                                                                                                                          Radiology CT CHEST ABDOMEN PELVIS WO CONTRAST Result Date: 08/28/2023 CLINICAL DATA:  Sepsis. EXAM: CT CHEST, ABDOMEN AND PELVIS WITHOUT CONTRAST TECHNIQUE: Multidetector CT imaging of the chest, abdomen and pelvis was performed following the standard protocol without IV contrast. RADIATION DOSE REDUCTION: This exam was performed according to the departmental dose-optimization program which includes automated exposure control, adjustment of the mA and/or kV according to patient size and/or use of iterative reconstruction technique. COMPARISON:  Abdomen and pelvis CT 07/02/2022 FINDINGS: CT CHEST FINDINGS Cardiovascular: The heart size is normal. No substantial pericardial effusion. Coronary artery calcification is evident. Moderate atherosclerotic calcification is noted in the wall of the thoracic aorta. Mediastinum/Nodes: No  mediastinal lymphadenopathy. No evidence for gross hilar lymphadenopathy although assessment is limited by the lack of intravenous contrast on the current study. Fluid in the mid and distal esophagus likely related to reflux. There is no axillary lymphadenopathy. Lungs/Pleura: Fine architectural detail the lungs obscured by breathing motion. There is patchy and nodular ground-glass and confluent airspace consolidation in both lungs, most advanced in the right middle and lower lobes including a 2.6 x 1.9 cm nodular consolidative opacity in the right lower lobe on image 68/5. Tree-in-bud nodularity is seen in the left lower lobe with paraspinal patchy and nodular left lower lobe consolidative disease measuring up to 1.6 cm diameter. Peripheral small airway impaction noted in the lower lobes bilaterally with debris filling the right lower lobe bronchus. No pneumothorax or substantial pleural effusion. Musculoskeletal: No worrisome lytic or sclerotic osseous abnormality. Cortical thickening with dense trabeculation in the left scapula suggest Paget's disease, stable since neck CTA 08/08/2022. CT ABDOMEN PELVIS FINDINGS Hepatobiliary: No suspicious focal abnormality in the liver on this study without intravenous contrast. Gallbladder is distended with tiny layering calcified gallstones. No intrahepatic or extrahepatic biliary dilation. Pancreas: Pancreas is diffusely atrophic without main duct dilatation. Spleen: No splenomegaly. No suspicious focal mass  lesion. Adrenals/Urinary Tract: No adrenal nodule or mass. 3 mm nonobstructing stone identified right kidney 1.7 cm ill-defined hypoattenuating lesion identified anterior interpolar right kidney on 56/3 in the general region of an apparent cyst seen on study from 07/02/2022. Tiny nonobstructing stones noted left kidney. No evidence for hydroureter. Bladder is decompressed. Stomach/Bowel: Stomach is markedly distended and filled with fluid. 2.9 x 2.1 cm fat density lesion  in the antral region is compatible with a lipoma, also visible on the prior study from 2023. This does not appear to be resulting in gastric outlet obstruction. Duodenum is fluid-filled and distended. Jejunal loops are fluid-filled and dilated up to 3.4 cm diameter. Small bowel remains dilated to the right pelvis where there is an apparent abrupt transition zone (axial 82/3 and well demonstrated on coronal 58/6). There is mesenteric congestion and edema associated with the dilated small bowel loops. No small bowel wall thickening or pneumatosis evident. Small bowel in the right lower quadrant is completely decompressed into the terminal ileum and ileocecal valve (axial 54/3 and well demonstrated coronal 30/6). Right and transverse colon are relatively decompressed although gas and stool are seen throughout the entire length of the colon. Prominent rectal stool volume evident with formed stool in the colorectal junction. Vascular/Lymphatic: There is moderate atherosclerotic calcification of the abdominal aorta without aneurysm. There is no gastrohepatic or hepatoduodenal ligament lymphadenopathy. No retroperitoneal or mesenteric lymphadenopathy. No pelvic sidewall lymphadenopathy. Right femoral arterial line evident. Reproductive: The prostate gland and seminal vesicles are unremarkable. Other: No substantial ascites. There is some trace perirectal edema. Musculoskeletal: No worrisome lytic or sclerotic osseous abnormality. Avascular necrosis noted in the femoral heads bilaterally. IMPRESSION: 1. Patchy / nodular ground-glass and confluent airspace consolidation in both lungs, most advanced in the right middle and lower lobes including a 2.6 x 1.9 cm nodular consolidative opacity in the right lower lobe. Tree-in-bud nodularity is seen in the left lower lobe with paraspinal patchy and nodular left lower lobe consolidative disease measuring up to 1.6 cm diameter. Imaging features are compatible with multifocal  pneumonia. Peripheral small airway impaction in the lower lobes bilaterally with debris filling the right lower lobe bronchus raises the question of aspiration. 2. Markedly distended stomach and duodenum with fluid-filled dilated jejunal loops. Small bowel remains dilated to the right pelvis where there is an apparent abrupt transition zone. Small bowel in the right lower quadrant is completely decompressed into the terminal ileum / ileocecal valve. Imaging features compatible with small bowel obstruction with adhesion a distinct consideration. Mesenteric congestion and edema associated with the dilated small bowel loops. No small bowel wall thickening or pneumatosis evident. 3. Fluid in the mid and distal esophagus is likely related to reflux. 4. Prominent rectal stool volume with formed stool in the colorectal junction. 5. Cholelithiasis. 6. Bilateral nonobstructing renal stones. 7. 1.7 cm ill-defined hypoattenuating lesion anterior interpolar right kidney in the general region of an apparent cyst seen on study from 07/02/2022. This is indeterminate. After patient recovers from acute symptoms, renal protocol CT or MRI with and without contrast recommended to further evaluate. 8. Avascular necrosis in the femoral heads bilaterally. 9.  Aortic Atherosclerosis (ICD10-I70.0). Electronically Signed   By: Kennith Center M.D.   On: 08/23/2023 06:55   DG Chest Port 1 View Result Date: 08/16/2023 CLINICAL DATA:  Respiratory distress. EXAM: PORTABLE CHEST 1 VIEW COMPARISON:  07/17/2023 FINDINGS: Low lung volumes with similar asymmetric elevation right hemidiaphragm. Similar diffuse underlying interstitial coarsening, likely chronic. Subtle atelectasis or infiltrate noted at  the right lung base. No substantial pleural effusion. Cardiopericardial silhouette is at upper limits of normal for size. Bones are diffusely demineralized. Telemetry leads overlie the chest. IMPRESSION: 1. Low lung volumes with similar asymmetric  elevation right hemidiaphragm. 2. Subtle atelectasis or infiltrate at the right lung base. Electronically Signed   By: Kennith Center M.D.   On: 08/20/2023 06:18    Pertinent labs & imaging results that were available during my care of the patient were reviewed by me and considered in my medical decision making (see MDM for details).  Medications Ordered in ED Medications  norepinephrine (LEVOPHED) 4mg  in (0.016 mg/mL) premix infusion (12 mcg/min Intravenous Rate/Dose Change 09/09/2023 0604)  glycopyrrolate (ROBINUL) injection 0.2 mg (has no administration in time range)  polyvinyl alcohol (LIQUIFILM TEARS) 1.4 % ophthalmic solution 1 drop (has no administration in time range)  sodium chloride flush (NS) 0.9 % injection 3-10 mL (has no administration in time range)  sodium chloride flush (NS) 0.9 % injection 3-10 mL (has no administration in time range)  HYDROmorphone (DILAUDID) bolus via infusion 1 mg (has no administration in time range)  HYDROmorphone (DILAUDID) 50 mg in 50 mL NS (1mg /mL) premix infusion (1 mg/hr Intravenous New Bag/Given 09/08/2023 0858)  LORazepam (ATIVAN) injection 2-4 mg (has no administration in time range)  sodium chloride 0.9 % bolus 2,000 mL (2,000 mLs Intravenous New Bag/Given 08/30/2023 0505)  ceFEPIme (MAXIPIME) 2 g in sodium chloride 0.9 % 100 mL IVPB (0 g Intravenous Stopped 08/20/2023 0612)  vancomycin (VANCOCIN) IVPB 1000 mg/200 mL premix (0 mg Intravenous Stopped 08/14/2023 0715)                                                                                                                                     Procedures .Critical Care  Performed by: Glendora Score, MD Authorized by: Glendora Score, MD   Critical care provider statement:    Critical care time (minutes):  100   Critical care was necessary to treat or prevent imminent or life-threatening deterioration of the following conditions:  Sepsis, respiratory failure and shock   Critical care was time  spent personally by me on the following activities:  Development of treatment plan with patient or surrogate, discussions with consultants, evaluation of patient's response to treatment, examination of patient, ordering and review of laboratory studies, ordering and review of radiographic studies, ordering and performing treatments and interventions, pulse oximetry, re-evaluation of patient's condition and review of old charts ARTERIAL LINE  Date/Time: 08/23/2023 7:44 PM  Performed by: Glendora Score, MD Authorized by: Glendora Score, MD   Consent:    Consent obtained:  Emergent situation Indications:    Indications: hemodynamic monitoring and multiple ABGs   Pre-procedure details:    Skin preparation:  Chlorhexidine Sedation:    Sedation type:  None Anesthesia:    Anesthesia method:  None Procedure details:    Location:  R femoral   Placement  technique:  Seldinger Post-procedure details:    Post-procedure:  Sterile dressing applied   CMS:  Normal   Procedure completion:  Tolerated well, no immediate complications Central Line  Date/Time: 08/28/2023 7:45 PM  Performed by: Glendora Score, MD Authorized by: Glendora Score, MD   Consent:    Consent obtained:  Emergent situation Sedation:    Sedation type:  None Anesthesia:    Anesthesia method:  None Procedure details:    Location:  L internal jugular   Procedural supplies:  Triple lumen   Ultrasound guidance: yes     Ultrasound guidance timing: real time     Number of attempts:  2   Successful placement: no   Post-procedure details:    Post-procedure:  Line sutured and dressing applied   Procedure completion:  Tolerated well, no immediate complications Comments:     Central line attempted x 2, veins collapsible central line placement unsuccessful   (including critical care time)  Medical Decision Making / ED Course   This patient presents to the ED for concern of altered mental status, respiratory distress, this  involves an extensive number of treatment options, and is a complaint that carries with it a high risk of complications and morbidity.  The differential diagnosis includes infection, metabolic/toxic encephalopathy, hypoglycemia, malperfusion, hypoxia, trauma or other intracranial process  MDM: Patient seen emergency room for evaluation of respiratory distress and altered mental status.  Physical exam reveals a toxic appearing patient with agonal respirations, no response to commands, mottled extremities, distended abdomen.  Patient severely hypotensive on arrival with initial pressures 60s over 40s.  Peripheral access obtained by nursing staff and patient aggressively fluid resuscitated with 2 L of IVF and Levophed initiated.  Cuff pressures unreliable and patient ultimately received a right femoral A-line.  Vasopressin added for pressor support.  Patient started on BiPAP for preoxygenation in anticipation of possible intubation.  Attempted to stabilize patient prior to pushing RSI medications due to high risk for decompensation.  Pressures ultimately stabilized and the patient was prepped for intubation, but after appropriate cerebral perfusion pressures were reached patient starting to become more responsive.  Thus, intubation deferred as ABGs are showing appropriate oxygenation.  I-STAT labs showing a pH of 7.108 with no significant hypercarbia.  Follow-up confirmatory labs with a new AKI with a BUN 58, creatinine 2.56.  Initial i-STAT lactic acid 9.6 and broad-spectrum antibiotics initiated.  Attempted central line placement x 2 without success.  Chest x-ray obtained showing some mild aspiration.  CT chest abdomen pelvis showing aspiration pneumonia, markedly distended bowels concerning for small bowel obstruction.  Patient will require ICU admission.  Spoke with Myrla Halsted from the ICU who will come to evaluate patient.   Additional history obtained: -Additional history obtained from daughter -External  records from outside source obtained and reviewed including: Chart review including previous notes, labs, imaging, consultation notes   Lab Tests: -I ordered, reviewed, and interpreted labs.   The pertinent results include:   Labs Reviewed  COMPREHENSIVE METABOLIC PANEL - Abnormal; Notable for the following components:      Result Value   CO2 13 (*)    Glucose, Bld 164 (*)    BUN 58 (*)    Creatinine, Ser 2.56 (*)    GFR, Estimated 23 (*)    Anion gap 23 (*)    All other components within normal limits  CBC WITH DIFFERENTIAL/PLATELET - Abnormal; Notable for the following components:   Abs Immature Granulocytes 0.10 (*)    All  other components within normal limits  I-STAT CHEM 8, ED - Abnormal; Notable for the following components:   BUN 52 (*)    Creatinine, Ser 2.30 (*)    Glucose, Bld 155 (*)    TCO2 18 (*)    All other components within normal limits  I-STAT CG4 LACTIC ACID, ED - Abnormal; Notable for the following components:   Lactic Acid, Venous 9.6 (*)    All other components within normal limits  I-STAT ARTERIAL BLOOD GAS, ED - Abnormal; Notable for the following components:   pH, Arterial 7.103 (*)    pCO2 arterial 30.2 (*)    pO2, Arterial 361 (*)    Bicarbonate 9.5 (*)    TCO2 10 (*)    Acid-base deficit 19.0 (*)    Calcium, Ion 1.12 (*)    HCT 33.0 (*)    Hemoglobin 11.2 (*)    All other components within normal limits  I-STAT ARTERIAL BLOOD GAS, ED - Abnormal; Notable for the following components:   pH, Arterial 7.108 (*)    pCO2 arterial 27.3 (*)    Bicarbonate 8.6 (*)    TCO2 9 (*)    Acid-base deficit 19.0 (*)    Sodium 146 (*)    Potassium 3.4 (*)    Calcium, Ion 1.06 (*)    HCT 30.0 (*)    Hemoglobin 10.2 (*)    All other components within normal limits  CBG MONITORING, ED - Abnormal; Notable for the following components:   Glucose-Capillary 123 (*)    All other components within normal limits  CULTURE, BLOOD (ROUTINE X 2)  CULTURE, BLOOD  (ROUTINE X 2)  RESP PANEL BY RT-PCR (RSV, FLU A&B, COVID)  RVPGX2  URINALYSIS, W/ REFLEX TO CULTURE (INFECTION SUSPECTED)  I-STAT CG4 LACTIC ACID, ED  I-STAT CG4 LACTIC ACID, ED      EKG   EKG Interpretation Date/Time:  Wednesday August 31 2023 07:35:45 EST Ventricular Rate:  97 PR Interval:  146 QRS Duration:  117 QT Interval:  377 QTC Calculation: 477 R Axis:   140  Text Interpretation: Right and left arm electrode reversal, interpretation assumes no reversal Sinus rhythm Nonspecific intraventricular conduction delay Low voltage, extremity leads Probable lateral infarct, age indeterminate Confirmed by Margeart Allender ([id number]) on 08/19/2023 7:50:37 PM         Imaging Studies ordered: I ordered imaging studies including chest x-ray, CT chest abdomen pelvis I independently visualized and interpreted imaging. I agree with the radiologist interpretation   Medicines ordered and prescription drug management: Meds ordered this encounter  Medications   sodium chloride 0.9 % bolus 2,000 mL   norepinephrine (LEVOPHED) 4mg  in (0.016 mg/mL) premix infusion   DISCONTD: vasopressin (PITRESSIN) 20 Units in 100 mL (0.2 unit/mL) infusion-*FOR SHOCK*   DISCONTD: docusate (COLACE) 50 MG/5ML liquid 100 mg   DISCONTD: polyethylene glycol (MIRALAX / GLYCOLAX) packet 17 g   DISCONTD: fentaNYL in NS (72mcg/ml) infusion-PREMIX   DISCONTD: fentaNYL (SUBLIMAZE) bolus via infusion 25-100 mcg    Refill:  0   DISCONTD: midazolam (VERSED) injection 1-2 mg   ceFEPIme (MAXIPIME) 2 g in sodium chloride 0.9 % 100 mL IVPB    Antibiotic Indication::   Sepsis   vancomycin (VANCOCIN) IVPB 1000 mg/200 mL premix    Indication::   Sepsis   DISCONTD: glycopyrrolate (ROBINUL) tablet 1 mg   DISCONTD: glycopyrrolate (ROBINUL) injection 0.2 mg   glycopyrrolate (ROBINUL) injection 0.2 mg   polyvinyl alcohol (LIQUIFILM TEARS) 1.4 % ophthalmic  solution 1 drop   sodium chloride flush (NS) 0.9 %  injection 3-10 mL   sodium chloride flush (NS) 0.9 % injection 3-10 mL   HYDROmorphone (DILAUDID) bolus via infusion 1 mg    Refill:  0   HYDROmorphone (DILAUDID) 50 mg in 50 mL NS (1mg /mL) premix infusion   LORazepam (ATIVAN) injection 2-4 mg   DISCONTD: acetaminophen (TYLENOL) tablet 650 mg   DISCONTD: acetaminophen (TYLENOL) suppository 650 mg    -I have reviewed the patients home medicines and have made adjustments as needed  Critical interventions Fluid resuscitation, antibiotics, pressor support, A-line placement, BiPAP  Consultations Obtained: I requested consultation with the intensivist on-call,  and discussed lab and imaging findings as well as pertinent plan - they recommend: ICU admission   Cardiac Monitoring: The patient was maintained on a cardiac monitor.  I personally viewed and interpreted the cardiac monitored which showed an underlying rhythm of: Sinus tachycardia  Social Determinants of Health:  Factors impacting patients care include: Is in skilled nursing facility   Reevaluation: After the interventions noted above, I reevaluated the patient and found that they have :improved  Co morbidities that complicate the patient evaluation  Past Medical History:  Diagnosis Date   CKD (chronic kidney disease) stage 3, GFR 30-59 ml/min (HCC)    Gait abnormality    Hypertension    Pancreatitis    Seizures (HCC)    Venous insufficiency       Dispostion: I considered admission for this patient, and patient were hospital admission for respiratory failure and shock     Final Clinical Impression(s) / ED Diagnoses Final diagnoses:  Acidosis  Acute respiratory failure with hypoxia (HCC)     @PCDICTATION @    Glendora Score, MD 09/07/2023 1951

## 2023-09-10 NOTE — ED Notes (Signed)
Jugular central line attempted by Glendora Score, MD.

## 2023-09-10 NOTE — Death Summary Note (Signed)
Death Summary    Patient details  Name: [name]  [id number]  May 09, 1931  2023-09-14  0  Date of death: September 14, 2023 Time of Death: 0943   Profile data   Living situation: SNF Independent with ADLs: no  Bed bound or WC bound : yes Code status  DNR/DNI, comfort care establish after arrival to ED by daughter   Reason for admission   Small bowel obstruction   Preliminary Cause of death   Septic shock   Secondary diagnoses, contributing co-morbidities & complicating factors  Small bowel obstruction Septic shock POA Frail elderly Severe protein calorie malnutrition POA Dementia Dysphagia AKI on CKD3b Hypercarbic and septic encephalopathy Hx seizures   Condition on arrival (check all that apply)   Carlos Shields is a 89 y.o. male admitted from skilled nursing facility. Patient full code from facility and made DNR/DNI, comfort care by daughter shortly after arrival.   Patient acutely, critically ill appearing in the setting of multiple chronic comorbidities. Admitted with septic shock, respiratory failure, toxic encephalopathy, acute on chronic renal failure and   Hospital course    Patient initially presented to ED after developing respiratory distress at SNF, Per EMS, patient was gurgling with respiration and hypotensive to SBP 70s. On ED arrival, patient was tachycardic to 100s, BO 110s/50s, RR 30s, SpO2 95% on BiPAP. Initial ABG 7.10/30.2/361/9.5. Labs were notable for WBC 5.4, Hgb 13.0, Plt 272. Na 140, K 4.0, CO2 13, BUN 58/Cr 2.56. LFTs WNL. LA 9.6. Bcx pending. Broad-spectrum antibiotics (cefepime) and Levophed initiated. CT A/P demonstrated bilateral patchy nodular GGOs and confluent airspace consolidation most notable in RML concerning for multifocal PNA with findings c/f aspiration and markedly distended stomach/duodenum and SB with transition point in RLQ c/f SBO. PCCM consulted for ICU admission and management. CCM team had long discussion with daughter in ED. Discussed prior  comorbidities, age, poor functional status, multiorgan dysfunction, small bowel obstruction with septic shock, lactic acidosis, respiratory failure. It was discussed that he likely has poor prognosis and would likely not survive surgery. The daughter felt patient would not want to suffer and agreed to make patient DNR/DNI and initiate comfort care measures. He was then allowed to pass naturally.    Cristopher Peru, PA-C [facility] 09/01/23 5:13 PM  Please see [email or web address] for pager details.  From 7A-7P if no response, please call [phone] After hours, please call ELink [phone]

## 2023-09-10 NOTE — ED Notes (Signed)
Artrial line placed by Glendora Score, MD.

## 2023-09-10 NOTE — Progress Notes (Signed)
Await CT read, shows right lower lobe airspace disease and distended stomach and small bowel with air-fluid levels.  Await lactate Concern is for ischemic bowel. He will need NG decompression and perhaps emergent abdominal surgery.  Will need goals of care discussion with family.  Full consult to follow  Kenzo Ozment V. Therisa Doyne

## 2023-09-10 NOTE — ED Triage Notes (Signed)
Patient coming for [employer]. Facility called for respiratory distress. No further information was given to EMS by facility. EMS noted intermittent "gurgling" sounds when the patient breathed.   EMS VS 15 end tidal 40 RR 77/18 BP 120 HR Stated they could not get a pulse ox

## 2023-09-10 NOTE — Progress Notes (Signed)
Pt transported from ED17 to CT and back via BIPAP with no complications.

## 2023-09-10 NOTE — Death Summary Note (Incomplete)
DEATH SUMMARY   Patient Details  Name: Carlos Shields MRN: [id number] DOB: September 12, 1930  Admission/Discharge Information   Admit Date:  17-Sep-2023  Date of Death: Date of Death: 2023-09-17  Time of Death: Time of Death: 0943  Length of Stay: 0  Referring Physician: Norm Salt, PA     Diagnoses  Preliminary cause of death: small bowel obstruction x 5 days Secondary Diagnoses (including complications and co-morbidities):  Septic shock POA Frail elderly Severe protein calorie malnutrition POA Dementia Dysphagia AKI on CKD3b Hypercarbic and septic encephalopathy Hx seizures   Brief Hospital Course (including significant findings, care, treatment, and services provided and events leading to death)  Patient initially presented to ED after developing respiratory distress at SNF, Per EMS, patient was gurgling with respiration and hypotensive to SBP 70s. On ED arrival, patient was tachycardic to 100s, BO 110s/50s, RR 30s, SpO2 95% on BiPAP. Initial ABG 7.10/30.2/361/9.5. Labs were notable for WBC 5.4, Hgb 13.0, Plt 272. Na 140, K 4.0, CO2 13, BUN 58/Cr 2.56. LFTs WNL. LA 9.6. Bcx pending. Broad-spectrum antibiotics (cefepime) and Levophed initiated. CT A/P demonstrated bilateral patchy nodular GGOs and confluent airspace consolidation most notable in RML concerning for multifocal PNA with findings c/f aspiration and markedly distended stomach/duodenum and SB with transition point in RLQ c/f SBO. PCCM consulted for ICU admission and management.   Patient in worsening multiorgan failure and not stable for OR nor even intubation.  Discussed with daughter and family and all in agreement to allow a natural passing.  Pertinent Labs and Studies  Significant Diagnostic Studies CT CHEST ABDOMEN PELVIS WO CONTRAST Result Date: Sep 17, 2023 CLINICAL DATA:  Sepsis. EXAM: CT CHEST, ABDOMEN AND PELVIS WITHOUT CONTRAST TECHNIQUE: Multidetector CT imaging of the chest, abdomen and pelvis was performed  following the standard protocol without IV contrast. RADIATION DOSE REDUCTION: This exam was performed according to the departmental dose-optimization program which includes automated exposure control, adjustment of the mA and/or kV according to patient size and/or use of iterative reconstruction technique. COMPARISON:  Abdomen and pelvis CT 07/02/2022 FINDINGS: CT CHEST FINDINGS Cardiovascular: The heart size is normal. No substantial pericardial effusion. Coronary artery calcification is evident. Moderate atherosclerotic calcification is noted in the wall of the thoracic aorta. Mediastinum/Nodes: No mediastinal lymphadenopathy. No evidence for gross hilar lymphadenopathy although assessment is limited by the lack of intravenous contrast on the current study. Fluid in the mid and distal esophagus likely related to reflux. There is no axillary lymphadenopathy. Lungs/Pleura: Fine architectural detail the lungs obscured by breathing motion. There is patchy and nodular ground-glass and confluent airspace consolidation in both lungs, most advanced in the right middle and lower lobes including a 2.6 x 1.9 cm nodular consolidative opacity in the right lower lobe on image 68/5. Tree-in-bud nodularity is seen in the left lower lobe with paraspinal patchy and nodular left lower lobe consolidative disease measuring up to 1.6 cm diameter. Peripheral small airway impaction noted in the lower lobes bilaterally with debris filling the right lower lobe bronchus. No pneumothorax or substantial pleural effusion. Musculoskeletal: No worrisome lytic or sclerotic osseous abnormality. Cortical thickening with dense trabeculation in the left scapula suggest Paget's disease, stable since neck CTA 08/08/2022. CT ABDOMEN PELVIS FINDINGS Hepatobiliary: No suspicious focal abnormality in the liver on this study without intravenous contrast. Gallbladder is distended with tiny layering calcified gallstones. No intrahepatic or extrahepatic  biliary dilation. Pancreas: Pancreas is diffusely atrophic without main duct dilatation. Spleen: No splenomegaly. No suspicious focal mass lesion. Adrenals/Urinary Tract: No adrenal nodule  or mass. 3 mm nonobstructing stone identified right kidney 1.7 cm ill-defined hypoattenuating lesion identified anterior interpolar right kidney on 56/3 in the general region of an apparent cyst seen on study from 07/02/2022. Tiny nonobstructing stones noted left kidney. No evidence for hydroureter. Bladder is decompressed. Stomach/Bowel: Stomach is markedly distended and filled with fluid. 2.9 x 2.1 cm fat density lesion in the antral region is compatible with a lipoma, also visible on the prior study from 2023. This does not appear to be resulting in gastric outlet obstruction. Duodenum is fluid-filled and distended. Jejunal loops are fluid-filled and dilated up to 3.4 cm diameter. Small bowel remains dilated to the right pelvis where there is an apparent abrupt transition zone (axial 82/3 and well demonstrated on coronal 58/6). There is mesenteric congestion and edema associated with the dilated small bowel loops. No small bowel wall thickening or pneumatosis evident. Small bowel in the right lower quadrant is completely decompressed into the terminal ileum and ileocecal valve (axial 54/3 and well demonstrated coronal 30/6). Right and transverse colon are relatively decompressed although gas and stool are seen throughout the entire length of the colon. Prominent rectal stool volume evident with formed stool in the colorectal junction. Vascular/Lymphatic: There is moderate atherosclerotic calcification of the abdominal aorta without aneurysm. There is no gastrohepatic or hepatoduodenal ligament lymphadenopathy. No retroperitoneal or mesenteric lymphadenopathy. No pelvic sidewall lymphadenopathy. Right femoral arterial line evident. Reproductive: The prostate gland and seminal vesicles are unremarkable. Other: No substantial  ascites. There is some trace perirectal edema. Musculoskeletal: No worrisome lytic or sclerotic osseous abnormality. Avascular necrosis noted in the femoral heads bilaterally. IMPRESSION: 1. Patchy / nodular ground-glass and confluent airspace consolidation in both lungs, most advanced in the right middle and lower lobes including a 2.6 x 1.9 cm nodular consolidative opacity in the right lower lobe. Tree-in-bud nodularity is seen in the left lower lobe with paraspinal patchy and nodular left lower lobe consolidative disease measuring up to 1.6 cm diameter. Imaging features are compatible with multifocal pneumonia. Peripheral small airway impaction in the lower lobes bilaterally with debris filling the right lower lobe bronchus raises the question of aspiration. 2. Markedly distended stomach and duodenum with fluid-filled dilated jejunal loops. Small bowel remains dilated to the right pelvis where there is an apparent abrupt transition zone. Small bowel in the right lower quadrant is completely decompressed into the terminal ileum / ileocecal valve. Imaging features compatible with small bowel obstruction with adhesion a distinct consideration. Mesenteric congestion and edema associated with the dilated small bowel loops. No small bowel wall thickening or pneumatosis evident. 3. Fluid in the mid and distal esophagus is likely related to reflux. 4. Prominent rectal stool volume with formed stool in the colorectal junction. 5. Cholelithiasis. 6. Bilateral nonobstructing renal stones. 7. 1.7 cm ill-defined hypoattenuating lesion anterior interpolar right kidney in the general region of an apparent cyst seen on study from 07/02/2022. This is indeterminate. After patient recovers from acute symptoms, renal protocol CT or MRI with and without contrast recommended to further evaluate. 8. Avascular necrosis in the femoral heads bilaterally. 9.  Aortic Atherosclerosis (ICD10-I70.0). Electronically Signed   By: Kennith Center  M.D.   On: 08/24/2023 06:55   DG Chest Port 1 View Result Date: 08/20/2023 CLINICAL DATA:  Respiratory distress. EXAM: PORTABLE CHEST 1 VIEW COMPARISON:  07/17/2023 FINDINGS: Low lung volumes with similar asymmetric elevation right hemidiaphragm. Similar diffuse underlying interstitial coarsening, likely chronic. Subtle atelectasis or infiltrate noted at the right lung base. No substantial  pleural effusion. Cardiopericardial silhouette is at upper limits of normal for size. Bones are diffusely demineralized. Telemetry leads overlie the chest. IMPRESSION: 1. Low lung volumes with similar asymmetric elevation right hemidiaphragm. 2. Subtle atelectasis or infiltrate at the right lung base. Electronically Signed   By: Kennith Center M.D.   On: 08/30/2023 06:18    Microbiology No results found for this or any previous visit (from the past 240 hours).  Lab Basic Metabolic Panel: Recent Labs  Lab 09/02/2023 0451 09/04/2023 0457 08/16/2023 0459 09/06/2023 0605  NA 141 140 145 146*  K 3.9 4.0 3.5 3.4*  CL 111 104  --   --   CO2  --  13*  --   --   GLUCOSE 155* 164*  --   --   BUN 52* 58*  --   --   CREATININE 2.30* 2.56*  --   --   CALCIUM  --  9.3  --   --    Liver Function Tests: Recent Labs  Lab 09/08/2023 0457  AST 27  ALT 9  ALKPHOS 52  BILITOT 1.1  PROT 7.8  ALBUMIN 3.7   No results for input(s): "LIPASE", "AMYLASE" in the last 168 hours. No results for input(s): "AMMONIA" in the last 168 hours. CBC: Recent Labs  Lab 08/19/2023 0451 09/03/2023 0457 09/07/2023 0459 09/02/2023 0605  WBC  --  5.4  --   --   NEUTROABS  --  1.7  --   --   HGB 15.0 13.0 11.2* 10.2*  HCT 44.0 42.9 33.0* 30.0*  MCV  --  92.3  --   --   PLT  --  272  --   --    Cardiac Enzymes: No results for input(s): "CKTOTAL", "CKMB", "CKMBINDEX", "TROPONINI" in the last 168 hours. Sepsis Labs: Recent Labs  Lab 08/27/2023 0452 08/16/2023 0457  WBC  --  5.4  LATICACIDVEN 9.6*  --     Procedures/Operations   ***   Lorin Glass 08/18/2023, 10:44 AM

## 2023-09-10 NOTE — H&P (Signed)
NAME:  Carlos Shields, MRN:  [id number], DOB:  09/27/1930, LOS: 0 ADMISSION DATE:  08/29/2023 CONSULTATION DATE:  09/09/2023 REFERRING MD:  Kommor - EDP CHIEF COMPLAINT:  Respiratory distress, hypotension, ?sepsis  History of Present Illness:  89 year old man who presented to [facility] ED 2/19 from SNF [facility]) for respiratory distress. PMHx significant for HTN, venous insufficiency, CKD stage 3b, seizures, pancreatitis, dementia, dysphagia.  Patient initially presented to ED after developing respiratory distress at SNF, Per EMS, patient was gurgling with respiration and hypotensive to SBP 70s. On ED arrival, patient was tachycardic to 100s, BO 110s/50s, RR 30s, SpO2 95% on BiPAP. Initial ABG 7.10/30.2/361/9.5. Labs were notable for WBC 5.4, Hgb 13.0, Plt 272. Na 140, K 4.0, CO2 13, BUN 58/Cr 2.56. LFTs WNL. LA 9.6. Bcx pending. Broad-spectrum antibiotics (cefepime) and Levophed initiated. CT A/P demonstrated bilateral patchy nodular GGOs and confluent airspace consolidation most notable in RML concerning for multifocal PNA with findings c/f aspiration and markedly distended stomach/duodenum and SB with transition point in RLQ c/f SBO. PCCM consulted for ICU admission and management.  Pertinent Medical History:   Past Medical History:  Diagnosis Date   CKD (chronic kidney disease) stage 3, GFR 30-59 ml/min (HCC)    Gait abnormality    Hypertension    Pancreatitis    Seizures (HCC)    Venous insufficiency    Significant Hospital Events: Including procedures, antibiotic start and stop dates in addition to other pertinent events   2/19 - Presented to [facility] ED from SNF for respiratory distress and vomiting  Interim History / Subjective:   Objective:  Pulse (!) 102, resp. rate (!) 32.    Vent Mode: BIPAP;PCV FiO2 (%):  [60 %] 60 % Set Rate:  [12 bmp] 12 bmp PEEP:  [5 cmH20] 5 cmH20  No intake or output data in the 24 hours ending 08/18/2023 0655 There were no vitals filed for this  visit.  Physical Examination: General:  AoC ill appearing cachetic elderly male lying in bed on BIPAP HEENT: pupils 3/r, full face mask on, no JVD Neuro: eyes open, moans, intermittently followed simple commands- wiggled left toes, no other spont movement noted CV: rr, NSR, no appreciable murmur, R femoral aline PULM:  tachypneic in 30's on BiPAP with good TV, few bibasilar rales  GI: distended, no BS appreciated, no foley Extremities: cool/dry, no muscle mass, poor peripheral perfusion, cyanotic fingers   Resolved Hospital Problem List:    Assessment & Plan:   Septic shock 2/2 multifocal/ aspiration PNA and SBO with concern for mesenteric ischemia Acute hypoxic respiratory failure Severe lactic acidosis/ AGMA AKI on CKD3b Acute metabolic/ septic encephalopathy hx dementia, CVA, dysphagia seizures P:  - after bedside discussion with daughter, given pt's prior comorbidities, age, and poor functional status with current MODS 2/2 SBO with septic shock, lactic acidosis, and respiratory failure with ongoing aspiration from SBO with AoC dysphagia, prognosis is poor.  Pt would not likely survive surgery.  Daughter does not want pt to suffer.  Will admit to ICU for comfort care, pt DNR/ DNI.  - cont NE, but no escalation of pressors/ care while awaiting remainder of family to arrive/ visit.  Continue aline for now - start dilaudid gtt and when RR more comfortable, d/c BiPAP  - cont keppra for comfort - comfort orders in place, ongoing emotional support.  - hospital passing expected   Best Practice: (right click and "Reselect all SmartList Selections" daily)   Diet/type: NPO DVT prophylaxis: not indicated GI prophylaxis:  N/A Lines: Arterial Line Foley:  N/A Code Status:  DNR Last date of multidisciplinary goals of care discussion [2/19]  Labs:  CBC: Recent Labs  Lab 08/20/2023 0451 08/16/2023 0457 09/04/2023 0459 09/07/2023 0605  WBC  --  5.4  --   --   NEUTROABS  --  1.7  --   --    HGB 15.0 13.0 11.2* 10.2*  HCT 44.0 42.9 33.0* 30.0*  MCV  --  92.3  --   --   PLT  --  272  --   --    Basic Metabolic Panel: Recent Labs  Lab 09/09/2023 0451 09/09/2023 0457 08/14/2023 0459 08/18/2023 0605  NA 141 140 145 146*  K 3.9 4.0 3.5 3.4*  CL 111 104  --   --   CO2  --  13*  --   --   GLUCOSE 155* 164*  --   --   BUN 52* 58*  --   --   CREATININE 2.30* 2.56*  --   --   CALCIUM  --  9.3  --   --    GFR: CrCl cannot be calculated (Unknown ideal weight.). Recent Labs  Lab 09/09/2023 0452 08/29/2023 0457  WBC  --  5.4  LATICACIDVEN 9.6*  --    Liver Function Tests: Recent Labs  Lab 08/23/2023 0457  AST 27  ALT 9  ALKPHOS 52  BILITOT 1.1  PROT 7.8  ALBUMIN 3.7   No results for input(s): "LIPASE", "AMYLASE" in the last 168 hours. No results for input(s): "AMMONIA" in the last 168 hours.  ABG:    Component Value Date/Time   PHART 7.108 (LL) 08/17/2023 0605   PCO2ART 27.3 (L) 09/07/2023 0605   PO2ART 90 08/23/2023 0605   HCO3 8.6 (L) 08/18/2023 0605   TCO2 9 (L) 09/05/2023 0605   ACIDBASEDEF 19.0 (H) 09/04/2023 0605   O2SAT 93 09/04/2023 0605    Coagulation Profile: No results for input(s): "INR", "PROTIME" in the last 168 hours.  Cardiac Enzymes: No results for input(s): "CKTOTAL", "CKMB", "CKMBINDEX", "TROPONINI" in the last 168 hours.  HbA1C: Hgb A1c MFr Bld  Date/Time Value Ref Range Status  08/05/2022 02:28 PM 5.3 4.8 - 5.6 % Final    Comment:    (NOTE) Pre diabetes:          5.7%-6.4%  Diabetes:              >6.4%  Glycemic control for   <7.0% adults with diabetes    CBG: No results for input(s): "GLUCAP" in the last 168 hours.  Review of Systems:   unable  Past Medical History:  He,  has a past medical history of CKD (chronic kidney disease) stage 3, GFR 30-59 ml/min (HCC), Gait abnormality, Hypertension, Pancreatitis, Seizures (HCC), and Venous insufficiency.   Surgical History:   Past Surgical History:  Procedure Laterality Date    HERNIA REPAIR     TRANSCAROTID ARTERY REVASCULARIZATION  Left 08/12/2022   Procedure: Transcarotid Artery Revascularization;  Surgeon: Maeola Harman, MD;  Location: [facility] OR;  Service: Vascular;  Laterality: Left;    Social History:   reports that he has quit smoking. His smoking use included cigars. He has been exposed to tobacco smoke. He has never used smokeless tobacco. He reports that he does not drink alcohol and does not use drugs.   Family History:  His family history includes Asthma in an other family member; Diabetes in an other family member; Hypertension in an other family  member.   Allergies: No Known Allergies   Home Medications: Prior to Admission medications   Medication Sig Start Date End Date Taking? Authorizing Provider  acetaminophen (TYLENOL) 500 MG tablet Take 1,000 mg by mouth every 8 (eight) hours as needed for mild pain (pain score 1-3) or moderate pain (pain score 4-6).   Yes [provider]  aspirin EC 81 MG tablet Take 81 mg by mouth daily. Swallow whole.   Yes [provider]  gabapentin (NEURONTIN) 100 MG capsule Take 100 mg by mouth 3 (three) times daily.   Yes [provider]  levETIRAcetam (KEPPRA) 500 MG tablet Take 1 tablet (500 mg total) by mouth 2 (two) times daily. 04/20/23 04/14/24 Yes Camara, Amalia Hailey, MD  memantine (NAMENDA) 10 MG tablet Take 1 tablet (10 mg total) by mouth 2 (two) times daily. 04/20/23 04/14/24 Yes Windell Norfolk, MD  metoprolol tartrate (LOPRESSOR) 25 MG tablet Take 0.5 tablets (12.5 mg total) by mouth 2 (two) times daily. 08/26/22  Yes Angiulli, Mcarthur Rossetti, PA-C  Nutritional Supplement LIQD Take 120 mLs by mouth daily. Med[employer]   Yes [provider]  pantoprazole (PROTONIX) 40 MG tablet Take 1 tablet (40 mg total) by mouth daily. Patient taking differently: Take 40 mg by mouth every other day. 08/26/22  Yes Angiulli, Mcarthur Rossetti, PA-C   Critical care time: 38 mins      Posey Boyer, MSN,  AG-ACNP-BC [facility] 08/13/2023, 7:47 AM  See Amion for pager If no response to pager , please call [phone] until 7pm After 7:00 pm call Elink  [phone]

## 2023-09-10 NOTE — ED Notes (Signed)
Awaiting dilaudid drip from pharmacy.

## 2023-09-10 NOTE — ED Notes (Addendum)
Due to inacturate pulse oximetry, EDP Kommor, [place], MD stated to rely on ABGs for oxygen saturation level.

## 2023-09-10 NOTE — Progress Notes (Signed)
Approximately 50ml of Dilaudid gtt wasted by Raenette Rover, RN and this RN.

## 2023-09-10 NOTE — Progress Notes (Signed)
1610 unable auscultate breath sounds and absent of heart beat per ECG monitor and auscultation by Ardith Dark RN and Peggyann Juba RN.

## 2023-09-10 NOTE — ED Notes (Signed)
This nurse called RT due to patient's respiratory rate and low oxygen saturation.

## 2023-09-10 DEATH — deceased

## 2023-09-14 ENCOUNTER — Encounter (HOSPITAL_COMMUNITY): Payer: Medicare Other

## 2023-09-14 ENCOUNTER — Ambulatory Visit: Payer: Medicare Other

## 2023-09-29 ENCOUNTER — Ambulatory Visit: Payer: Medicare Other | Admitting: Neurology

## 2024-04-25 ENCOUNTER — Ambulatory Visit: Payer: Medicare Other | Admitting: Neurology
